# Patient Record
Sex: Male | Born: 1940 | Race: White | Hispanic: No | Marital: Single | Smoking: Never smoker
Health system: Southern US, Community
[De-identification: ages and names within clinical notes are randomized; demographics above are authoritative.]

## PROBLEM LIST (undated history)

## (undated) DIAGNOSIS — I5189 Other ill-defined heart diseases: Secondary | ICD-10-CM

## (undated) DIAGNOSIS — E291 Testicular hypofunction: Secondary | ICD-10-CM

## (undated) DIAGNOSIS — Q791 Other congenital malformations of diaphragm: Secondary | ICD-10-CM

## (undated) DIAGNOSIS — Z5189 Encounter for other specified aftercare: Secondary | ICD-10-CM

## (undated) DIAGNOSIS — F528 Other sexual dysfunction not due to a substance or known physiological condition: Secondary | ICD-10-CM

## (undated) DIAGNOSIS — C801 Malignant (primary) neoplasm, unspecified: Secondary | ICD-10-CM

## (undated) DIAGNOSIS — M1990 Unspecified osteoarthritis, unspecified site: Secondary | ICD-10-CM

## (undated) DIAGNOSIS — B1710 Acute hepatitis C without hepatic coma: Secondary | ICD-10-CM

## (undated) DIAGNOSIS — D126 Benign neoplasm of colon, unspecified: Secondary | ICD-10-CM

## (undated) DIAGNOSIS — K5790 Diverticulosis of intestine, part unspecified, without perforation or abscess without bleeding: Secondary | ICD-10-CM

## (undated) DIAGNOSIS — I10 Essential (primary) hypertension: Secondary | ICD-10-CM

## (undated) DIAGNOSIS — K759 Inflammatory liver disease, unspecified: Secondary | ICD-10-CM

## (undated) DIAGNOSIS — E781 Pure hyperglyceridemia: Secondary | ICD-10-CM

## (undated) DIAGNOSIS — M109 Gout, unspecified: Secondary | ICD-10-CM

## (undated) DIAGNOSIS — R011 Cardiac murmur, unspecified: Secondary | ICD-10-CM

## (undated) DIAGNOSIS — E119 Type 2 diabetes mellitus without complications: Secondary | ICD-10-CM

## (undated) HISTORY — DX: Diverticulosis of intestine, part unspecified, without perforation or abscess without bleeding: K57.90

## (undated) HISTORY — DX: Essential (primary) hypertension: I10

## (undated) HISTORY — DX: Acute hepatitis C without hepatic coma: B17.10

## (undated) HISTORY — DX: Type 2 diabetes mellitus without complications: E11.9

## (undated) HISTORY — DX: Encounter for other specified aftercare: Z51.89

## (undated) HISTORY — DX: Pure hyperglyceridemia: E78.1

## (undated) HISTORY — PX: UPPER GASTROINTESTINAL ENDOSCOPY: SHX188

## (undated) HISTORY — DX: Other ill-defined heart diseases: I51.89

## (undated) HISTORY — PX: TONSILLECTOMY AND ADENOIDECTOMY: SUR1326

## (undated) HISTORY — DX: Unspecified osteoarthritis, unspecified site: M19.90

## (undated) HISTORY — DX: Benign neoplasm of colon, unspecified: D12.6

## (undated) HISTORY — DX: Testicular hypofunction: E29.1

## (undated) HISTORY — DX: Other congenital malformations of diaphragm: Q79.1

## (undated) HISTORY — DX: Other sexual dysfunction not due to a substance or known physiological condition: F52.8

## (undated) HISTORY — DX: Gout, unspecified: M10.9

## (undated) HISTORY — PX: COLONOSCOPY: SHX174

---

## 1997-12-01 HISTORY — PX: CHOLECYSTECTOMY: SHX55

## 1998-12-01 HISTORY — PX: ABDOMINAL HERNIA REPAIR: SHX539

## 1999-05-26 ENCOUNTER — Encounter: Payer: Self-pay | Admitting: Emergency Medicine

## 1999-05-27 ENCOUNTER — Encounter (INDEPENDENT_AMBULATORY_CARE_PROVIDER_SITE_OTHER): Payer: Self-pay | Admitting: Specialist

## 1999-05-27 ENCOUNTER — Inpatient Hospital Stay (HOSPITAL_COMMUNITY): Admission: EM | Admit: 1999-05-27 | Discharge: 1999-05-28 | Payer: Self-pay | Admitting: Emergency Medicine

## 1999-05-27 ENCOUNTER — Encounter: Payer: Self-pay | Admitting: Emergency Medicine

## 1999-05-27 ENCOUNTER — Encounter: Payer: Self-pay | Admitting: Surgery

## 1999-12-04 ENCOUNTER — Encounter (INDEPENDENT_AMBULATORY_CARE_PROVIDER_SITE_OTHER): Payer: Self-pay | Admitting: *Deleted

## 1999-12-04 ENCOUNTER — Ambulatory Visit (HOSPITAL_BASED_OUTPATIENT_CLINIC_OR_DEPARTMENT_OTHER): Admission: RE | Admit: 1999-12-04 | Discharge: 1999-12-04 | Payer: Self-pay | Admitting: Surgery

## 2001-06-29 ENCOUNTER — Encounter: Payer: Self-pay | Admitting: Emergency Medicine

## 2001-06-29 ENCOUNTER — Emergency Department (HOSPITAL_COMMUNITY): Admission: EM | Admit: 2001-06-29 | Discharge: 2001-06-29 | Payer: Self-pay | Admitting: Emergency Medicine

## 2001-07-18 ENCOUNTER — Emergency Department (HOSPITAL_COMMUNITY): Admission: EM | Admit: 2001-07-18 | Discharge: 2001-07-18 | Payer: Self-pay | Admitting: Emergency Medicine

## 2003-12-02 HISTORY — PX: LUMBAR FUSION: SHX111

## 2004-07-03 ENCOUNTER — Ambulatory Visit (HOSPITAL_COMMUNITY): Admission: RE | Admit: 2004-07-03 | Discharge: 2004-07-03 | Payer: Self-pay | Admitting: Orthopedic Surgery

## 2004-08-26 ENCOUNTER — Ambulatory Visit (HOSPITAL_COMMUNITY): Admission: RE | Admit: 2004-08-26 | Discharge: 2004-08-26 | Payer: Self-pay | Admitting: Neurosurgery

## 2004-10-21 ENCOUNTER — Ambulatory Visit (HOSPITAL_COMMUNITY): Admission: RE | Admit: 2004-10-21 | Discharge: 2004-10-21 | Payer: Self-pay | Admitting: Neurology

## 2004-11-18 ENCOUNTER — Encounter: Admission: RE | Admit: 2004-11-18 | Discharge: 2004-11-18 | Payer: Self-pay | Admitting: Neurology

## 2004-11-29 ENCOUNTER — Ambulatory Visit: Payer: Self-pay | Admitting: Endocrinology

## 2004-12-13 ENCOUNTER — Observation Stay (HOSPITAL_COMMUNITY): Admission: RE | Admit: 2004-12-13 | Discharge: 2004-12-14 | Payer: Self-pay | Admitting: Neurosurgery

## 2004-12-20 ENCOUNTER — Ambulatory Visit: Payer: Self-pay | Admitting: Endocrinology

## 2004-12-24 ENCOUNTER — Ambulatory Visit: Payer: Self-pay | Admitting: Endocrinology

## 2005-01-03 ENCOUNTER — Ambulatory Visit: Payer: Self-pay

## 2005-01-10 ENCOUNTER — Ambulatory Visit: Payer: Self-pay | Admitting: Endocrinology

## 2005-03-24 ENCOUNTER — Ambulatory Visit: Payer: Self-pay | Admitting: Endocrinology

## 2005-05-06 ENCOUNTER — Ambulatory Visit: Payer: Self-pay | Admitting: Endocrinology

## 2005-05-12 ENCOUNTER — Ambulatory Visit: Payer: Self-pay | Admitting: Endocrinology

## 2006-02-11 ENCOUNTER — Ambulatory Visit: Payer: Self-pay | Admitting: Endocrinology

## 2006-02-16 ENCOUNTER — Ambulatory Visit: Payer: Self-pay | Admitting: Endocrinology

## 2007-04-04 ENCOUNTER — Emergency Department (HOSPITAL_COMMUNITY): Admission: EM | Admit: 2007-04-04 | Discharge: 2007-04-04 | Payer: Self-pay | Admitting: Emergency Medicine

## 2007-05-05 ENCOUNTER — Ambulatory Visit: Payer: Self-pay | Admitting: Endocrinology

## 2007-05-05 LAB — CONVERTED CEMR LAB
ALT: 41 units/L — ABNORMAL HIGH (ref 0–40)
AST: 33 units/L (ref 0–37)
Albumin: 4.2 g/dL (ref 3.5–5.2)
Alkaline Phosphatase: 53 units/L (ref 39–117)
BUN: 15 mg/dL (ref 6–23)
Bacteria, UA: NEGATIVE
Basophils Absolute: 0 10*3/uL (ref 0.0–0.1)
Basophils Relative: 0.1 % (ref 0.0–1.0)
Bilirubin Urine: NEGATIVE
Bilirubin, Direct: 0.2 mg/dL (ref 0.0–0.3)
CO2: 32 meq/L (ref 19–32)
Calcium: 9.8 mg/dL (ref 8.4–10.5)
Chloride: 103 meq/L (ref 96–112)
Cholesterol: 153 mg/dL (ref 0–200)
Creatinine, Ser: 0.9 mg/dL (ref 0.4–1.5)
Creatinine,U: 81.3 mg/dL
Crystals: NEGATIVE
Eosinophils Absolute: 0.3 10*3/uL (ref 0.0–0.6)
Eosinophils Relative: 3.9 % (ref 0.0–5.0)
GFR calc Af Amer: 108 mL/min
GFR calc non Af Amer: 89 mL/min
Glucose, Bld: 131 mg/dL — ABNORMAL HIGH (ref 70–99)
HCT: 43.4 % (ref 39.0–52.0)
HDL: 44.3 mg/dL (ref >39.0)
Hemoglobin, Urine: NEGATIVE
Hemoglobin: 15.2 g/dL (ref 13.0–17.0)
Hgb A1c MFr Bld: 6.4 % — ABNORMAL HIGH (ref 4.6–6.0)
Ketones, ur: NEGATIVE mg/dL
LDL Cholesterol: 94 mg/dL (ref 0–99)
Leukocytes, UA: NEGATIVE
Lymphocytes Relative: 31.3 % (ref 12.0–46.0)
MCHC: 35 g/dL (ref 30.0–36.0)
MCV: 89 fL (ref 78.0–100.0)
Microalb Creat Ratio: 7.4 mg/g (ref 0.0–30.0)
Microalb, Ur: 0.6 mg/dL (ref 0.0–1.9)
Monocytes Absolute: 0.6 10*3/uL (ref 0.2–0.7)
Monocytes Relative: 7.7 % (ref 3.0–11.0)
Mucus, UA: NEGATIVE
Neutro Abs: 4.2 10*3/uL (ref 1.4–7.7)
Neutrophils Relative %: 57 % (ref 43.0–77.0)
Nitrite: NEGATIVE
PSA: 1.1 ng/mL (ref 0.10–4.00)
Platelets: 233 10*3/uL (ref 150–400)
Potassium: 4.5 meq/L (ref 3.5–5.1)
RBC / HPF: NONE SEEN
RBC: 4.87 M/uL (ref 4.22–5.81)
RDW: 11.8 % (ref 11.5–14.6)
Sodium: 141 meq/L (ref 135–145)
Specific Gravity, Urine: 1.02 (ref 1.000–1.03)
TSH: 1.44 microintl units/mL (ref 0.35–5.50)
Testosterone: 499.72 ng/dL (ref 350.00–890)
Total Bilirubin: 0.9 mg/dL (ref 0.3–1.2)
Total CHOL/HDL Ratio: 3.5
Total Protein, Urine: NEGATIVE mg/dL
Total Protein: 7.4 g/dL (ref 6.0–8.3)
Triglycerides: 76 mg/dL (ref 0–149)
Urine Glucose: NEGATIVE mg/dL
Urobilinogen, UA: 0.2 (ref 0.0–1.0)
VLDL: 15 mg/dL (ref 0–40)
WBC: 7.4 10*3/uL (ref 4.5–10.5)
pH: 6 (ref 5.0–8.0)

## 2007-06-04 ENCOUNTER — Emergency Department (HOSPITAL_COMMUNITY): Admission: EM | Admit: 2007-06-04 | Discharge: 2007-06-04 | Payer: Self-pay | Admitting: *Deleted

## 2007-06-16 ENCOUNTER — Encounter: Admission: RE | Admit: 2007-06-16 | Discharge: 2007-06-16 | Payer: Self-pay | Admitting: Neurosurgery

## 2007-07-27 ENCOUNTER — Ambulatory Visit: Payer: Self-pay | Admitting: Endocrinology

## 2007-08-25 ENCOUNTER — Encounter: Payer: Self-pay | Admitting: *Deleted

## 2007-08-25 DIAGNOSIS — F528 Other sexual dysfunction not due to a substance or known physiological condition: Secondary | ICD-10-CM

## 2007-08-25 DIAGNOSIS — B1710 Acute hepatitis C without hepatic coma: Secondary | ICD-10-CM | POA: Insufficient documentation

## 2007-08-25 DIAGNOSIS — E119 Type 2 diabetes mellitus without complications: Secondary | ICD-10-CM | POA: Insufficient documentation

## 2007-08-25 DIAGNOSIS — E781 Pure hyperglyceridemia: Secondary | ICD-10-CM

## 2007-08-25 DIAGNOSIS — I10 Essential (primary) hypertension: Secondary | ICD-10-CM | POA: Insufficient documentation

## 2007-08-25 HISTORY — DX: Other sexual dysfunction not due to a substance or known physiological condition: F52.8

## 2007-08-25 HISTORY — DX: Pure hyperglyceridemia: E78.1

## 2007-08-25 HISTORY — DX: Essential (primary) hypertension: I10

## 2007-08-25 HISTORY — DX: Type 2 diabetes mellitus without complications: E11.9

## 2007-08-25 HISTORY — DX: Acute hepatitis C without hepatic coma: B17.10

## 2007-09-22 ENCOUNTER — Ambulatory Visit: Payer: Self-pay | Admitting: Endocrinology

## 2007-09-22 LAB — CONVERTED CEMR LAB
ALT: 44 units/L (ref 0–53)
AST: 28 units/L (ref 0–37)
Albumin: 3.7 g/dL (ref 3.5–5.2)
Alkaline Phosphatase: 43 units/L (ref 39–117)
Bilirubin, Direct: 0.2 mg/dL (ref 0.0–0.3)
Cholesterol: 159 mg/dL (ref 0–200)
HDL: 57.2 mg/dL (ref >39.0)
Hgb A1c MFr Bld: 5.6 % (ref 4.6–6.0)
LDL Cholesterol: 75 mg/dL (ref 0–99)
Total Bilirubin: 0.8 mg/dL (ref 0.3–1.2)
Total CHOL/HDL Ratio: 2.8
Total Protein: 6.5 g/dL (ref 6.0–8.3)
Triglycerides: 136 mg/dL (ref 0–149)
VLDL: 27 mg/dL (ref 0–40)

## 2007-09-28 ENCOUNTER — Ambulatory Visit: Payer: Self-pay | Admitting: Endocrinology

## 2008-03-22 ENCOUNTER — Ambulatory Visit: Payer: Self-pay | Admitting: Endocrinology

## 2008-03-26 LAB — CONVERTED CEMR LAB: Hgb A1c MFr Bld: 5.8 % (ref 4.6–6.0)

## 2008-03-27 ENCOUNTER — Ambulatory Visit: Payer: Self-pay | Admitting: Endocrinology

## 2008-07-21 ENCOUNTER — Ambulatory Visit: Payer: Self-pay | Admitting: Endocrinology

## 2008-07-23 LAB — CONVERTED CEMR LAB
ALT: 40 units/L (ref 0–53)
AST: 29 units/L (ref 0–37)
Albumin: 4 g/dL (ref 3.5–5.2)
Alkaline Phosphatase: 45 units/L (ref 39–117)
BUN: 7 mg/dL (ref 6–23)
Basophils Absolute: 0.1 10*3/uL (ref 0.0–0.1)
Basophils Relative: 0.9 % (ref 0.0–3.0)
Bilirubin Urine: NEGATIVE
Bilirubin, Direct: 0.2 mg/dL (ref 0.0–0.3)
CO2: 31 meq/L (ref 19–32)
Calcium: 9.1 mg/dL (ref 8.4–10.5)
Chloride: 102 meq/L (ref 96–112)
Cholesterol: 129 mg/dL (ref 0–200)
Creatinine, Ser: 0.9 mg/dL (ref 0.4–1.5)
Eosinophils Absolute: 0.2 10*3/uL (ref 0.0–0.7)
Eosinophils Relative: 3.1 % (ref 0.0–5.0)
GFR calc Af Amer: 108 mL/min
GFR calc non Af Amer: 89 mL/min
Glucose, Bld: 216 mg/dL — ABNORMAL HIGH (ref 70–99)
HCT: 47.3 % (ref 39.0–52.0)
HDL: 29.7 mg/dL — ABNORMAL LOW (ref >39.0)
Hemoglobin, Urine: NEGATIVE
Hemoglobin: 16.3 g/dL (ref 13.0–17.0)
Hgb A1c MFr Bld: 5.6 % (ref 4.6–6.0)
Ketones, ur: NEGATIVE mg/dL
LDL Cholesterol: 72 mg/dL (ref 0–99)
Leukocytes, UA: NEGATIVE
Lymphocytes Relative: 31.5 % (ref 12.0–46.0)
MCHC: 34.4 g/dL (ref 30.0–36.0)
MCV: 93 fL (ref 78.0–100.0)
Monocytes Absolute: 0.3 10*3/uL (ref 0.1–1.0)
Monocytes Relative: 5.8 % (ref 3.0–12.0)
Neutro Abs: 3.4 10*3/uL (ref 1.4–7.7)
Neutrophils Relative %: 58.7 % (ref 43.0–77.0)
Nitrite: NEGATIVE
PSA: 1.23 ng/mL (ref 0.10–4.00)
Platelets: 220 10*3/uL (ref 150–400)
Potassium: 3.8 meq/L (ref 3.5–5.1)
RBC: 5.09 M/uL (ref 4.22–5.81)
RDW: 12.8 % (ref 11.5–14.6)
Sodium: 141 meq/L (ref 135–145)
Specific Gravity, Urine: 1.02 (ref 1.000–1.03)
TSH: 1.32 microintl units/mL (ref 0.35–5.50)
Total Bilirubin: 1.2 mg/dL (ref 0.3–1.2)
Total CHOL/HDL Ratio: 4.3
Total Protein, Urine: NEGATIVE mg/dL
Total Protein: 6.8 g/dL (ref 6.0–8.3)
Triglycerides: 139 mg/dL (ref 0–149)
Urine Glucose: 250 mg/dL — AB
Urobilinogen, UA: 0.2 (ref 0.0–1.0)
VLDL: 28 mg/dL (ref 0–40)
WBC: 5.9 10*3/uL (ref 4.5–10.5)
pH: 5.5 (ref 5.0–8.0)

## 2008-07-25 ENCOUNTER — Ambulatory Visit: Payer: Self-pay | Admitting: Endocrinology

## 2008-07-26 ENCOUNTER — Encounter: Payer: Self-pay | Admitting: Endocrinology

## 2008-07-27 LAB — CONVERTED CEMR LAB: Testosterone: 522.69 ng/dL (ref 350.00–890)

## 2008-11-27 ENCOUNTER — Ambulatory Visit: Payer: Self-pay | Admitting: Internal Medicine

## 2008-11-27 DIAGNOSIS — M109 Gout, unspecified: Secondary | ICD-10-CM

## 2008-11-27 HISTORY — DX: Gout, unspecified: M10.9

## 2008-12-29 ENCOUNTER — Emergency Department (HOSPITAL_COMMUNITY): Admission: EM | Admit: 2008-12-29 | Discharge: 2008-12-29 | Payer: Self-pay | Admitting: Emergency Medicine

## 2009-05-31 ENCOUNTER — Ambulatory Visit: Payer: Self-pay | Admitting: Internal Medicine

## 2009-07-17 ENCOUNTER — Ambulatory Visit: Payer: Self-pay | Admitting: Endocrinology

## 2009-07-17 DIAGNOSIS — R109 Unspecified abdominal pain: Secondary | ICD-10-CM | POA: Insufficient documentation

## 2009-07-17 LAB — CONVERTED CEMR LAB
Bilirubin Urine: NEGATIVE
Blood in Urine, dipstick: NEGATIVE
Ketones, urine, test strip: NEGATIVE
Nitrite: NEGATIVE
Specific Gravity, Urine: 1.02
Urobilinogen, UA: 0.2
WBC Urine, dipstick: NEGATIVE
pH: 5

## 2009-07-18 LAB — CONVERTED CEMR LAB
Basophils Absolute: 0 10*3/uL (ref 0.0–0.1)
Basophils Relative: 0.4 % (ref 0.0–3.0)
Eosinophils Absolute: 0.1 10*3/uL (ref 0.0–0.7)
Eosinophils Relative: 1.8 % (ref 0.0–5.0)
HCT: 42.1 % (ref 39.0–52.0)
Hemoglobin: 14.4 g/dL (ref 13.0–17.0)
Hgb A1c MFr Bld: 6.1 % (ref 4.6–6.5)
Lymphocytes Relative: 22.3 % (ref 12.0–46.0)
Lymphs Abs: 1.6 10*3/uL (ref 0.7–4.0)
MCHC: 34.2 g/dL (ref 30.0–36.0)
MCV: 91.2 fL (ref 78.0–100.0)
Monocytes Absolute: 0.7 10*3/uL (ref 0.1–1.0)
Monocytes Relative: 9.9 % (ref 3.0–12.0)
Neutro Abs: 4.9 10*3/uL (ref 1.4–7.7)
Neutrophils Relative %: 65.6 % (ref 43.0–77.0)
Platelets: 250 10*3/uL (ref 150.0–400.0)
RBC: 4.62 M/uL (ref 4.22–5.81)
RDW: 12.7 % (ref 11.5–14.6)
Sed Rate: 16 mm/hr (ref 0–22)
Testosterone: 297.66 ng/dL — ABNORMAL LOW (ref 350.00–890.00)
Uric Acid, Serum: 6.5 mg/dL (ref 4.0–7.8)
WBC: 7.3 10*3/uL (ref 4.5–10.5)

## 2009-08-01 ENCOUNTER — Encounter: Admission: RE | Admit: 2009-08-01 | Discharge: 2009-08-01 | Payer: Self-pay | Admitting: Endocrinology

## 2009-09-27 ENCOUNTER — Ambulatory Visit: Payer: Self-pay | Admitting: Endocrinology

## 2009-09-27 DIAGNOSIS — E291 Testicular hypofunction: Secondary | ICD-10-CM | POA: Insufficient documentation

## 2009-09-27 DIAGNOSIS — Q791 Other congenital malformations of diaphragm: Secondary | ICD-10-CM | POA: Insufficient documentation

## 2009-09-27 HISTORY — DX: Other congenital malformations of diaphragm: Q79.1

## 2009-09-27 HISTORY — DX: Testicular hypofunction: E29.1

## 2009-09-27 LAB — CONVERTED CEMR LAB
ALT: 42 units/L (ref 0–53)
AST: 34 units/L (ref 0–37)
Albumin: 4.2 g/dL (ref 3.5–5.2)
Alkaline Phosphatase: 49 units/L (ref 39–117)
BUN: 14 mg/dL (ref 6–23)
Bilirubin Urine: NEGATIVE
Bilirubin, Direct: 0.2 mg/dL (ref 0.0–0.3)
CO2: 27 meq/L (ref 19–32)
Calcium: 9.2 mg/dL (ref 8.4–10.5)
Chloride: 100 meq/L (ref 96–112)
Cholesterol: 165 mg/dL (ref 0–200)
Creatinine, Ser: 0.7 mg/dL (ref 0.4–1.5)
Creatinine,U: 57 mg/dL
Direct LDL: 90.6 mg/dL
FSH: 20.7 milliintl units/mL — ABNORMAL HIGH (ref 1.4–18.1)
GFR calc non Af Amer: 118.67 mL/min (ref >60)
Glucose, Bld: 108 mg/dL — ABNORMAL HIGH (ref 70–99)
HDL: 44.3 mg/dL (ref >39.00)
Hemoglobin, Urine: NEGATIVE
Hgb A1c MFr Bld: 5.5 % (ref 4.6–6.5)
Ketones, ur: NEGATIVE mg/dL
LH: 6.95 milliintl units/mL (ref 1.50–9.30)
Microalb Creat Ratio: 5.3 mg/g (ref 0.0–30.0)
Microalb, Ur: 0.3 mg/dL (ref 0.0–1.9)
Nitrite: NEGATIVE
PSA: 1.48 ng/mL (ref 0.10–4.00)
Potassium: 3.6 meq/L (ref 3.5–5.1)
Prolactin: 7.5 ng/mL
Sodium: 135 meq/L (ref 135–145)
Specific Gravity, Urine: 1.015 (ref 1.000–1.030)
TSH: 1.72 microintl units/mL (ref 0.35–5.50)
Total Bilirubin: 0.8 mg/dL (ref 0.3–1.2)
Total CHOL/HDL Ratio: 4
Total Protein, Urine: NEGATIVE mg/dL
Total Protein: 7.1 g/dL (ref 6.0–8.3)
Triglycerides: 231 mg/dL — ABNORMAL HIGH (ref 0.0–149.0)
Urine Glucose: NEGATIVE mg/dL
Urobilinogen, UA: 0.2 (ref 0.0–1.0)
VLDL: 46.2 mg/dL — ABNORMAL HIGH (ref 0.0–40.0)
pH: 5.5 (ref 5.0–8.0)

## 2009-10-03 ENCOUNTER — Telehealth (INDEPENDENT_AMBULATORY_CARE_PROVIDER_SITE_OTHER): Payer: Self-pay | Admitting: *Deleted

## 2009-10-08 ENCOUNTER — Telehealth: Payer: Self-pay | Admitting: Endocrinology

## 2009-10-08 ENCOUNTER — Ambulatory Visit: Payer: Self-pay | Admitting: Endocrinology

## 2009-10-11 ENCOUNTER — Encounter: Payer: Self-pay | Admitting: Internal Medicine

## 2009-10-16 ENCOUNTER — Telehealth: Payer: Self-pay | Admitting: Endocrinology

## 2009-11-05 ENCOUNTER — Telehealth: Payer: Self-pay | Admitting: Endocrinology

## 2009-11-05 ENCOUNTER — Ambulatory Visit: Payer: Self-pay | Admitting: Endocrinology

## 2009-11-05 LAB — CONVERTED CEMR LAB
Testosterone: 304.78 ng/dL — ABNORMAL LOW (ref 350.00–890.00)
Uric Acid, Serum: 6.5 mg/dL (ref 4.0–7.8)

## 2010-01-24 ENCOUNTER — Encounter: Payer: Self-pay | Admitting: Endocrinology

## 2010-02-04 ENCOUNTER — Telehealth: Payer: Self-pay | Admitting: Endocrinology

## 2010-02-06 ENCOUNTER — Ambulatory Visit: Payer: Self-pay | Admitting: Endocrinology

## 2010-02-06 DIAGNOSIS — L908 Other atrophic disorders of skin: Secondary | ICD-10-CM | POA: Insufficient documentation

## 2010-02-06 DIAGNOSIS — L918 Other hypertrophic disorders of the skin: Secondary | ICD-10-CM

## 2010-02-06 DIAGNOSIS — M79609 Pain in unspecified limb: Secondary | ICD-10-CM | POA: Insufficient documentation

## 2010-02-06 LAB — CONVERTED CEMR LAB
Hgb A1c MFr Bld: 5.6 % (ref 4.6–6.5)
Sed Rate: 2 mm/hr (ref 0–22)
Uric Acid, Serum: 6 mg/dL (ref 4.0–7.8)

## 2010-03-07 ENCOUNTER — Encounter: Payer: Self-pay | Admitting: Endocrinology

## 2010-06-11 ENCOUNTER — Telehealth: Payer: Self-pay | Admitting: Endocrinology

## 2010-06-12 ENCOUNTER — Telehealth: Payer: Self-pay | Admitting: Endocrinology

## 2010-06-12 ENCOUNTER — Telehealth: Payer: Self-pay | Admitting: Internal Medicine

## 2010-09-11 ENCOUNTER — Encounter: Payer: Self-pay | Admitting: Endocrinology

## 2010-10-01 ENCOUNTER — Telehealth: Payer: Self-pay | Admitting: Endocrinology

## 2010-12-31 NOTE — Progress Notes (Signed)
  Phone Note Refill Request Message from:  Patient on October 01, 2010 10:39 AM  Refills Requested: Medication #1:  ENDOCET 10-325 MG TABS 1 q6h as needed pain Initial call taken by: Margaret Pyle, CMA,  October 01, 2010 10:39 AM  Follow-up for Phone Call        i printed refill x1 cpx is due Follow-up by: Minus Breeding MD,  October 01, 2010 10:45 AM  Additional Follow-up for Phone Call Additional follow up Details #1::        No answer no VM, Rx in cabinet for pt pick up  Pt informed of Rx Additional Follow-up by: Margaret Pyle, CMA,  October 01, 2010 11:17 AM    Prescriptions: ENDOCET 10-325 MG TABS (OXYCODONE-ACETAMINOPHEN) 1 q6h as needed pain  #50 x 0   Entered and Authorized by:   Minus Breeding MD   Signed by:   Minus Breeding MD on 10/01/2010   Method used:   Print then Give to Patient   RxID:   [id number]

## 2010-12-31 NOTE — Progress Notes (Signed)
Summary: Percocet  Phone Note Call from Patient Call back at Home Phone [phone]   Caller: Patient Summary of Call: Pt called stating that he has Colchicine for gout. Pt is specifically requesting Percocet for pain. Please advise Initial call taken by: Margaret Pyle, CMA,  June 12, 2010 11:23 AM  Follow-up for Phone Call        i printed Follow-up by: Minus Breeding MD,  June 12, 2010 2:31 PM  Additional Follow-up for Phone Call Additional follow up Details #1::        Pt informed, Rx in cabinet for pt pick up Additional Follow-up by: Margaret Pyle, CMA,  June 12, 2010 2:39 PM    Prescriptions: ENDOCET 10-325 MG TABS (OXYCODONE-ACETAMINOPHEN) 1 q6h as needed pain  #50 x 0   Entered and Authorized by:   Minus Breeding MD   Signed by:   Minus Breeding MD on 06/12/2010   Method used:   Print then Give to Patient   RxID:   [id number]

## 2010-12-31 NOTE — Assessment & Plan Note (Signed)
Summary: 4 MTH FU CPX-$50-STC   Vital Signs:  Patient Profile:   70 Years Old Male Weight:      225.8 pounds Temp:     97.1 degrees F oral Pulse rate:   70 / minute BP sitting:   140 / 86  (left arm) Cuff size:   regular  Pt. in pain?   no  Vitals Entered By: Orlan Leavens (July 25, 2008 9:51 AM)                  Chief Complaint:  CPX.  History of Present Illness: non-smoker.  etoh is 10/week    Current Allergies: ! 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         denies weight loss, decreased urinary stream, blurry vision, headache, chest pain, cramps, excessive diaphoresis, memory loss, depression, bruising     Physical Exam  General:     well developed, well nourished, in no acute distress Head:     head is normocephalic eyes: no scleral icterus no periorbital swelling perrl external ears are normal nose normal externally mouth has no lesion, including normal tongue  Neck:     no masses, thyromegaly, or abnormal cervical nodes Lungs:     clear to auscultation.  no respiratory distress  Heart:     regular rate and rhythm, S1, S2 without murmurs, rubs, gallops, or clicks Abdomen:     abdomen is soft, nontender.  no hepatosplenomegaly.   not distended.  no hernia  Rectal:     normal external and internal exam.  heme neg  Msk:     no deformity or scoliosis noted with normal posture and gait Pulses:     dorsalis pedis intact bilat.  no carotid bruit  Neurologic:     cn 2-12 grossly intact.    readily moves all 4's.   sensation is intact to touch on the feet  Skin:     normal texture and temp.  no rash.  not diaphoretic  Cervical Nodes:     no significant adenopathy Psych:     alert and cooperative; normal mood and affect; normal attention span and concentration Additional Exam:     SEPARATE EVALUATION FOLLOWS--EACH PROBLEM HERE IS NEW, NOT RESPONDING TO TREATMENT, OR POSES SIGNIFICANT RISK TO THE PATIENT'S HEALTH: HISTORY OF THE PRESENT ILLNESS: takes bp meds as rx'ed pt c/o edema recently pt states ed sxs recently PAST MEDICAL HISTORY reviewed and up to date today REVIEW OF SYSTEMS: denies decreased urinary stream and sob PHYSICAL EXAMINATION: prostate normal no deformity.  no ulcer on the feet.  feet are of normal color and temp.  1+ bilat leg edema LAB/XRAY RESULTS: copy given to pt IMPRESSION: needs increased rx of htn ed edema PLAN: add cozaar 25/d d/c actos recheck testosterone i offered ref urol--he says he will consider    Impression & Recommendations:  Problem # 1:  ROUTINE GENERAL MEDICAL  EXAM@HEALTH  CARE FACL (ICD-V70.0)  Medications Added to Medication List This Visit: 1)  Cozaar 25 Mg Tabs (Losartan potassium) .... Qd  Other Orders: EKG w/ Interpretation (93000) TLB-Testosterone, Total (84403-TESTO) Est. Patient Level IV (16109)   Patient Instructions: 1)  we discussed the recommendations of the preventive services task force 2)  d/c etoh 3)  i advised pt of risk of refusal of colonoscopy   Prescriptions: COZAAR 25 MG TABS (LOSARTAN POTASSIUM) qd  #30 x 11   Entered and Authorized by:   Minus Breeding MD   Signed by:   Minus Breeding MD on 07/25/2008   Method used:   Electronically to        [facility] [address] #[id number]* (retail)       [address] [place], [place]  [address]       Ph: [phone]       Fax: [phone]   RxID:   [id number]  ]  Preventive Care Screening     colonoscopy refused 2009

## 2010-12-31 NOTE — Progress Notes (Signed)
  Phone Note Call from Patient Call back at Home Phone [phone] Message from:  Patient  Caller: Patient Summary of Call: MD printed pt Rx for Endocet but did not sign. Dr. Jonny Ruiz has agreed to sign second Rx, original destroyed. Initial call taken by: Margaret Pyle, CMA,  June 12, 2010 2:54 PM  Follow-up for Phone Call        Rx sign and pick up by patient Follow-up by: Margaret Pyle, CMA,  June 12, 2010 3:03 PM    Prescriptions: ENDOCET 10-325 MG TABS (OXYCODONE-ACETAMINOPHEN) 1 q6h as needed pain  #50 x 0   Entered by:   Margaret Pyle, CMA   Authorized by:   Corwin Levins MD   Signed by:   Margaret Pyle, CMA on 06/12/2010   Method used:   Print then Give to Patient   RxID:   [id number]

## 2010-12-31 NOTE — Medication Information (Signed)
Summary: denial Cozaar/[facility]  denial Cozaar/[facility]   Imported By: Lester Bennington 07/31/2008 11:56:40  _____________________________________________________________________  External Attachment:    Type:   Image     Comment:   External Document

## 2010-12-31 NOTE — Assessment & Plan Note (Signed)
Summary: FOOT SWOLLEN/$50/PN   Vital Signs:  Patient profile:   70 year old male Height:      71 inches (180.34 cm) Weight:      216.0 pounds (98.18 kg) BMI:     30.23 O2 Sat:      97 % Temp:     97.7 degrees F (36.50 degrees C) oral Pulse rate:   70 / minute BP sitting:   128 / 78  (left arm) Cuff size:   large  Vitals Entered By: Orlan Leavens (May 31, 2009 10:15 AM) CC: (R) Foot swollen, ? gout flare up Is Patient Diabetic? Yes  Pain Assessment Patient in pain? yes     Location: (R) foot Type: aching   Primary Care Provider:  Minus Breeding MD  CC:  (R) Foot swollen and ? gout flare up.  History of Present Illness: hx similar flare 12/09 - diagnosed and treated as gout at that time he denies any known injury to this foot Onset right foot pain and swelling 4 days ago maximum point of pain in base of little toe = 10/10 currently no other joints affected on L side, knees, hands, etc ?what he can do to prevent this pain/flare  Current Medications (verified): 1)  Aspirin 325 Mg  Tabs (Aspirin) .... Take 1 By Mouth Once Daily 2)  Zestoretic 20-12.5 Mg Tabs (Lisinopril-Hydrochlorothiazide) .... Take 2 Tablet By Mouth Once A Day 3)  Norvasc 10 Mg  Tabs (Amlodipine Besylate) .... Qd 4)  Glucophage 1000 Mg Tabs (Metformin Hcl) .... Take 1 Two Times A Day 5)  Centrum Silver Ultra Mens  Tabs (Multiple Vitamins-Minerals) .... Take 1 By Mouth Qd  Allergies (verified): 1)  ! Actos (Pioglitazone Hcl)  Past History:  Past Medical History: Reviewed history from 07/25/2008 and no changes required. 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    Physical Exam  General:  alert, well-developed, well-nourished, and cooperative  to examination.    Lungs:  normal respiratory effort, no intercostal retractions or use of accessory muscles; normal breath sounds bilaterally - no crackles and no wheezes.    Heart:  normal rate, regular rhythm, no murmur, and no rub. BLE without edema. (see Mskel for foot) Msk:  R foot diffuse mild edema with marked erythema over 5th toe: "sausage digit" appearance. tender to touch and passive movement. no ankle effusions with FROM at ankle. 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 Pain control If symptoms unimproved after completion of dose pack, patient to return for further x-ray and serology testing for arthropathies Consider prophylaxis of gout flares. If become more recurrent or frequent We'll also give patient education information on gout to see if this helps prevent recurrent flares  Elevate extremity; warm compresses, symptomatic relief and medication as directed.   Problem # 2:  DIABETES MELLITUS, TYPE II (ICD-250.00) expect steroids may transiently exacerbate glycemic control Patient educated to watch for this and contact us if severe hyperglycemia while on treatment for above His updated medication  list for this problem includes:    Aspirin 325 Mg Tabs (Aspirin) .Marland Kitchen... Take 1 by mouth once daily    Zestoretic 20-12.5 Mg Tabs (Lisinopril-hydrochlorothiazide) .Marland Kitchen... Take 2 tablet by mouth once a day    Glucophage 1000 Mg Tabs (Metformin hcl) .Marland Kitchen... Take 1 two times a day  Complete Medication List: 1)  Aspirin 325 Mg Tabs (Aspirin) .... Take 1 by mouth once daily 2)  Zestoretic 20-12.5 Mg Tabs (Lisinopril-hydrochlorothiazide) .... Take  2 tablet by mouth once a day 3)  Norvasc 10 Mg Tabs (Amlodipine besylate) .... Qd 4)  Glucophage 1000 Mg Tabs (Metformin hcl) .... Take 1 two times a day 5)  Centrum Silver Ultra Mens Tabs (Multiple vitamins-minerals) .... Take 1 by mouth qd 6)  Prednisone (pak) 10 Mg Tabs (Prednisone) .... As directed 7)  Vicodin Hp 10-660 Mg Tabs (Hydrocodone-acetaminophen) .Marland Kitchen.. 1 by mouth every 6 h as needed severe pain  Patient Instructions: 1)  we will treat her for pain as we did in December with steroid shot, a steroid dose pack, and pain medicines 2)  If recurrent flares or pain symptoms persist after completion of your steroids, call our office for further evaluation and testing 3)  You've been given handout information on gout, including causes and treatment Prescriptions: VICODIN HP 10-660 MG TABS (HYDROCODONE-ACETAMINOPHEN) 1 by mouth every 6 h as needed severe pain  #30 x 0   Entered and Authorized by:   Newt Lukes MD   Signed by:   Newt Lukes MD on 05/31/2009   Method used:   Print then Give to Patient   RxID:   [id number] PREDNISONE (PAK) 10 MG TABS (PREDNISONE) as directed  #1 x 0   Entered and Authorized by:   Newt Lukes MD   Signed by:   Newt Lukes MD on 05/31/2009   Method used:   Print then Give to Patient   RxID:   [id number]   Appended Document: FOOT SWOLLEN/$50/PN    Clinical Lists Changes  Orders: Added new Service order of Depo- Medrol 80mg  (J1040) - Signed Added new Service order of Depo- Medrol 40mg  (J1030) - Signed Added new Service order of Admin of Therapeutic Inj  intramuscular or subcutaneous (23557) - Signed       Medication Administration  Injection # 1:    Medication: Depo- Medrol 80mg     Diagnosis: 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

## 2010-12-31 NOTE — Assessment & Plan Note (Signed)
Summary: PER DAH SCHED  STC   Vital Signs:  Patient profile:   70 year old male Height:      71 inches (180.34 cm) Weight:      211.38 pounds (96.08 kg) O2 Sat:      95 % on Room air Temp:     97.3 degrees F (36.28 degrees C) oral Pulse rate:   76 / minute BP sitting:   124 / 70  (left arm) Cuff size:   large  Vitals Entered By: Josph Macho CMA (November 05, 2009 1:03 PM)  O2 Flow:  Room air CC: Right foot swollen X3 days/ pt doesn't want flu vax/ CF Is Patient Diabetic? Yes   Primary Provider:  Minus Breeding MD  CC:  Right foot swollen X3 days/ pt doesn't want flu vax/ CF.  History of Present Illness: pt states few days of severe pain at the instep of the right foot.  no local injury. he feels no different on the androgel   Current Medications (verified): 1)  Aspirin 325 Mg  Tabs (Aspirin) .... Take 1 By Mouth Once Daily 2)  Norvasc 10 Mg  Tabs (Amlodipine Besylate) .... Qd 3)  Glucophage 1000 Mg Tabs (Metformin Hcl) .... Take 1 Two Times A Day 4)  Centrum Silver Ultra Mens  Tabs (Multiple Vitamins-Minerals) .... Take 1 By Mouth Qd 5)  Lisinopril 40 Mg Tabs (Lisinopril) .Marland Kitchen.. 1 Qd 6)  Endocet 10-325 Mg Tabs (Oxycodone-Acetaminophen) .Marland Kitchen.. 1 Q6h As Needed Pain 7)  Androgel 25 Mg/2.5gm Gel (Testosterone) .... 2.5 Grams Qd  Allergies (verified): 1)  ! 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         denies numbness  Physical Exam  General:  normal appearance.   Extremities:  instep of right foot: has moderate tenderness and swelling.  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 1 qhr prn gout, nte 6/day  Other Orders: TLB-Uric Acid, Blood (84550-URIC) TLB-Testosterone, Total (84403-TESTO) Est. Patient Level IV (11914)  Patient Instructions: 1)  continue lisinopril 40 mg once daily, and amlodipine 10 mg once daily, for blood pressure. 2)  i refilled endocet 10/325 1 every 6 hrs as needed pain. 3)  Please schedule a follow-up appointment in 1-2 months. 4)  tests are being ordered for you today.  a few days after the test(s), please call [phone] to hear your test results. 5)  return as needed Prescriptions: ENDOCET 10-325 MG TABS (OXYCODONE-ACETAMINOPHEN) 1 q6h as needed pain  #100 x 0   Entered and Authorized by:   Minus Breeding MD   Signed by:   Minus Breeding MD on 11/05/2009   Method used:   Print then Give to Patient   RxID:   [id number] COLCHICINE 0.6 MG TABS (COLCHICINE) 1 qhr prn gout, nte 6/day  #30 x 2   Entered and Authorized by:   Minus Breeding MD   Signed by:   Minus Breeding MD on 11/05/2009   Method used:   Electronically to  [facility] [address] #[id number]* (retail)       [address] [place], [place]  [address]       Ph: [phone]       Fax: [phone]   RxID:   [id number]

## 2010-12-31 NOTE — Letter (Signed)
Summary: [facility]  [facility]   Imported By: Sherian Rein 01/29/2010 10:55:06  _____________________________________________________________________  External Attachment:    Type:   Image     Comment:   External Document

## 2010-12-31 NOTE — Assessment & Plan Note (Signed)
Summary: f/u appt per pt/#/cd   Vital Signs:  Patient profile:   70 year old male Height:      71 inches (180.34 cm) Weight:      215.75 pounds (98.07 kg) O2 Sat:      95 % on Room air Temp:     97.7 degrees F (36.50 degrees C) oral Pulse rate:   64 / minute BP sitting:   124 / 82  (left arm) Cuff size:   large  Vitals Entered By: Josph Macho RMA (February 06, 2010 9:45 AM)  O2 Flow:  Room air CC: Follow-up visit/ CF Is Patient Diabetic? Yes   Primary Provider:  Minus Breeding MD  CC:  Follow-up visit/ CF.  History of Present Illness: he continues to have intermittent pain at the instep of the left foot.  no local injury. pt states 4 mos of slight irritation at the skin of the left neck, and no associated itching. he takes allopurinol as rx'ed.  he thinks the for pain may be due to gout.  Current Medications (verified): 1)  Aspirin 325 Mg  Tabs (Aspirin) .... Take 1 By Mouth Once Daily 2)  Norvasc 10 Mg  Tabs (Amlodipine Besylate) .... Qd 3)  Glucophage 1000 Mg Tabs (Metformin Hcl) .... Take 1 Two Times A Day 4)  Centrum Silver Ultra Mens  Tabs (Multiple Vitamins-Minerals) .... Take 1 By Mouth Qd 5)  Lisinopril 40 Mg Tabs (Lisinopril) .Marland Kitchen.. 1 Qd 6)  Endocet 10-325 Mg Tabs (Oxycodone-Acetaminophen) .Marland Kitchen.. 1 Q6h As Needed Pain 7)  Androgel 25 Mg/2.5gm Gel (Testosterone) .... 2.5 Grams Qd 8)  Colchicine 0.6 Mg Tabs (Colchicine) .Marland Kitchen.. 1 Qhr Prn Gout, Nte 6/day 9)  Allopurinol 100 Mg Tabs (Allopurinol) .Marland Kitchen.. 1 Qam  Allergies (verified): 1)  ! 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         denies rash  Physical Exam  General:  normal appearance.   Msk:  gait: favors left foot. Pulses:   left dp is ormaln Extremities:  left toot.  trace swelling.  there is slight tenderness at the instep. no warmth/ulcer/erythema Neurologic:  sensation is intact to touch on the left foot. 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 related to #1  Medications Added to Medication List This Visit: 1)  Androgel 25 Mg/2.5gm Gel (Testosterone) .... 2.5 grams once daily dr Aldean Ast 2)  Allopurinol 300 Mg Tabs (Allopurinol) .Marland Kitchen.. 1 qd  Other Orders: Dermatology Referral (Derma) TLB-Uric Acid, Blood (84550-URIC) TLB-Sedimentation Rate (ESR) (85652-ESR) TLB-A1C / Hgb A1C (Glycohemoglobin) (83036-A1C) T-Foot Left Min 3 Views (73630TC) Est. Patient Level IV (16109) Prescription Created Electronically [phone])  Patient Instructions: 1)  blood tests and x ray today. 2)  tests are being ordered for you today.  a few days after the test(s), please call [phone] to hear your test results. 3)  pending the test results, please continue  the same medications for now. 4)  refer dermatology.  you will be called with a day and time for an appointment 5)  (update: i left message on phone-tree:  increase allopurinol to 300 mg once daily). Prescriptions: ALLOPURINOL 300 MG TABS (ALLOPURINOL) 1 qd  #30 x 11   Entered and Authorized by:   Minus Breeding MD   Signed by:   Minus Breeding MD on 02/06/2010   Method used:   Electronically to        [facility] [address] #[id number]* (retail)       [address] [place], [place]  [address]       Ph: [phone]       Fax: [phone]   RxID:   [id number]

## 2010-12-31 NOTE — Assessment & Plan Note (Signed)
Summary: CPX/$50/CD-PER PT RS BUMP/PHONE TO NOV-STC   Vital Signs:  Patient profile:   70 year old male Height:      71 inches (180.34 cm) Weight:      209.25 pounds (95.11 kg) O2 Sat:      94 % on Room air Temp:     97.0 degrees F (36.11 degrees C) oral Pulse rate:   73 / minute BP sitting:   118 / 74  (left arm) Cuff size:   large  Vitals Entered By: Josph Macho CMA (September 27, 2009 11:06 AM)  O2 Flow:  Room air CC: physical/ CF Is Patient Diabetic? Yes   Primary Provider:  Minus Breeding MD  CC:  physical/ CF.  History of Present Illness: here for regular wellness examination.  He's feeling pretty well in general, and does not smoke.  alcohol is rare   Current Medications (verified): 1)  Aspirin 325 Mg  Tabs (Aspirin) .... Take 1 By Mouth Once Daily 2)  Zestoretic 20-12.5 Mg Tabs (Lisinopril-Hydrochlorothiazide) .... Take 2 Tablet By Mouth Once A Day 3)  Norvasc 10 Mg  Tabs (Amlodipine Besylate) .... Qd 4)  Glucophage 1000 Mg Tabs (Metformin Hcl) .... Take 1 Two Times A Day 5)  Centrum Silver Ultra Mens  Tabs (Multiple Vitamins-Minerals) .... Take 1 By Mouth Qd 6)  Vicodin Hp 10-660 Mg Tabs (Hydrocodone-Acetaminophen) .Marland Kitchen.. 1 By Mouth Every 6 H As Needed Severe Pain  Allergies (verified): 1)  ! Actos (Pioglitazone Hcl)  Family History: Reviewed history from 07/25/2008 and no changes required. no cancer  Social History: Reviewed history from 11/27/2008 and no changes required. 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    Physical Exam  General:  normal appearance.   Head:  head: no deformity eyes: no periorbital swelling, no proptosis external nose and ears are  normal mouth: no lesion seen Neck:  Supple without thyroid enlargement or tenderness. No cervical lymphadenopathy, neck masses or tracheal deviation.  Chest Wall:  has prominent xyphoid Lungs:  clear to auscultation, except for decreased breath sounds at the left base.  no respiratory distress.  Heart:  Regular rate and rhythm without murmurs or gallops noted. Normal S1,S2.   Abdomen:  abdomen is soft, nontender.  no hepatosplenomegaly.   not distended.  there is a self-reducing ventral hernia  Rectal:  normal external and internal exam.  heme neg  Prostate:  Normal size prostate without masses or tenderness.  Msk:  muscle bulk and strength are grossly normal.  no obvious joint swelling.  gait is normal and steady  Pulses:  dorsalis pedis intact bilat.  no carotid bruit  Neurologic:  cn 2-12 grossly intact.   readily moves all 4's.   sensation is intact to touch on the feet  Skin:  normal texture and temp.  no rash.  not diaphoretic  Cervical Nodes:  No significant adenopathy.  Psych:  Alert and cooperative; normal mood and affect; normal attention span and concentration.   Additional Exam:  SEPARATE EVALUATION FOLLOWS--EACH PROBLEM HERE IS NEW, NOT RESPONDING TO TREATMENT, OR POSES SIGNIFICANT RISK TO THE PATIENT'S HEALTH: HISTORY OF THE PRESENT ILLNESS: right low-back pain persists pt says he continues to have "gout pain," left foot > right ed sxs persist pt was noted to have low  testosterone few mos ago. PAST MEDICAL HISTORY reviewed and up to date today REVIEW OF SYSTEMS: denies decreased urinary stream and numbness PHYSICAL EXAMINATION: see vs page no deformity.  no ulcer on the feet.  feet are of normal color and temp.  there is trace bilat edema there are bilateral varicosities on the legs LAB/XRAY RESULTS: Leutinizing Hormone       6.95 mIU/mL                 1.50-9.30 Prolactin                 7.5 ng/mL FSH                  [H]  20.7 mI IMPRESSION: primary  hypogonadism low-back pain gout erectile dysfunction, ? related to hypogonadism PLAN: i offered androgen 2.5 g once daily best option is pain medication see instructions   Impression & Recommendations:  Problem # 1:  ROUTINE GENERAL MEDICAL EXAM@HEALTH  CARE FACL (ICD-V70.0)  Medications Added to Medication List This Visit: 1)  Lisinopril 40 Mg Tabs (Lisinopril) .Marland Kitchen.. 1 qd 2)  Endocet 10-325 Mg Tabs (Oxycodone-acetaminophen) .Marland Kitchen.. 1 q6h as needed pain  Other Orders: EKG w/ Interpretation (93000) TLB-Lipid Panel (80061-LIPID) TLB-BMP (Basic Metabolic Panel-BMET) (80048-METABOL) TLB-Hepatic/Liver Function Pnl (80076-HEPATIC) TLB-TSH (Thyroid Stimulating Hormone) (84443-TSH) TLB-A1C / Hgb A1C (Glycohemoglobin) (83036-A1C) TLB-Microalbumin/Creat Ratio, Urine (82043-MALB) TLB-PSA (Prostate Specific Antigen) (84153-PSA) TLB-Udip w/ Micro (81001-URINE) TLB-Luteinizing Hormone (LH) (83002-LH) TLB-Prolactin (84146-PROL) TLB-FSH (Follicle Stimulating Hormone) (83001-FSH) Est. Patient Level IV (04540) Est. Patient 65& > (98119)  Patient Instructions: 1)  change lisinopril-hctz to lisinopril 40 mg once daily 2)  percocet 10/325 1 every 6 hrs as needed pain 3)  Please schedule a follow-up appointment in 1-2 months. Prescriptions: ENDOCET 10-325 MG TABS (OXYCODONE-ACETAMINOPHEN) 1 q6h as needed pain  #50 x 0   Entered and Authorized by:   Minus Breeding MD   Signed by:   Minus Breeding MD on 09/27/2009   Method used:   Print then Give to Patient   RxID:   [id number] LISINOPRIL 40 MG TABS (LISINOPRIL) 1 qd  #30 x 11   Entered and Authorized by:   Minus Breeding MD   Signed by:   Minus Breeding MD on 09/27/2009   Method used:   Electronically to        [facility] [address] #[id number]* (retail)       [address] [place], [place]  [address]       Ph: [phone]       Fax: [phone]   RxID:   [id number]    Preventive Care Screening  Last Tetanus Booster:     Date:  08/31/2000    Results:  Td      pt says he had colonoscopy done approx 2005

## 2010-12-31 NOTE — Progress Notes (Signed)
Summary: PA Androgel  Phone Note From Pharmacy   Caller: [facility] [address] #[id number]* Summary of Call: PA required for Androgel. Forms received and put on MD's desk for review Initial call taken by: Margaret Pyle, CMA,  October 08, 2009 8:22 AM     Appended Document: PA Androgel PA paperwork faxed to PA dept. awaiting response  Appended Document: PA Androgel PA was denied

## 2010-12-31 NOTE — Letter (Signed)
[facility]   Imported By: Sherian Rein 03/18/2010 11:27:23  _____________________________________________________________________  External Attachment:    Type:   Image     Comment:   External Document

## 2010-12-31 NOTE — Medication Information (Signed)
Summary: [facility]  [facility]   Imported By: Sherian Rein 10/15/2009 13:45:20  _____________________________________________________________________  External Attachment:    Type:   Image     Comment:   External Document

## 2010-12-31 NOTE — Progress Notes (Signed)
  Phone Note Call from Patient Call back at Home Phone [phone]   Caller: Patient Call For: Minus Breeding MD Summary of Call: Pt called  and states he listened to phone tree and was advised to call if he chose testosterone, he would like it. Please advise, Initial call taken by: Verdell Face,  October 03, 2009 11:25 AM  Follow-up for Phone Call        please advise Follow-up by: Josph Macho CMA,  October 03, 2009 11:28 AM  Additional Follow-up for Phone Call Additional follow up Details #1::        i printed Additional Follow-up by: Minus Breeding MD,  October 03, 2009 12:11 PM    Additional Follow-up for Phone Call Additional follow up Details #2::    printed rx put on Christy's desk for MD's signiture Follow-up by: Margaret Pyle, CMA,  October 03, 2009 1:23 PM  Additional Follow-up for Phone Call Additional follow up Details #3:: Details for Additional Follow-up Action Taken: Informed pt and let him know it would be in the front for him to pick up Additional Follow-up by: Josph Macho CMA,  October 04, 2009 8:45 AM  New/Updated Medications: ANDROGEL 25 MG/2.5GM GEL (TESTOSTERONE) 2.5 grams qd Prescriptions: ANDROGEL 25 MG/2.5GM GEL (TESTOSTERONE) 2.5 grams qd  #30 doses x 1   Entered and Authorized by:   Minus Breeding MD   Signed by:   Minus Breeding MD on 10/03/2009   Method used:   Print then Give to Patient   RxID:   [id number]

## 2010-12-31 NOTE — Assessment & Plan Note (Signed)
Summary: SIDE PAIN/ FOR WEEK / $50 /NWS   Vital Signs:  Patient profile:   70 year old male Height:      71 inches Weight:      206 pounds BMI:     28.84 O2 Sat:      95 % on Room air Temp:     97.2 degrees F oral Pulse rate:   77 / minute BP sitting:   126 / 78  (left arm) Cuff size:   large  Vitals Entered By: Bill Salinas CMA (July 17, 2009 3:02 PM)  O2 Flow:  Room air CC: pt here with complaint of lower back pain radiating to his right side with nausea and night sweats x 1 week/ on pain scale pt rates pain 8/10/ pt is no longer taking the vicodin hp/ ab   Primary Provider:  Minus Breeding MD  CC:  pt here with complaint of lower back pain radiating to his right side with nausea and night sweats x 1 week/ on pain scale pt rates pain 8/10/ pt is no longer taking the vicodin hp/ ab.  History of Present Illness: 1 week of severe pain at the right flank, and associated nausea.   does not check cbg's. takes 2 bp meds as rx'ed. ed sxs persist. no help with viagra nor cialis  Current Medications (verified): 1)  Aspirin 325 Mg  Tabs (Aspirin) .... Take 1 By Mouth Once Daily 2)  Zestoretic 20-12.5 Mg Tabs (Lisinopril-Hydrochlorothiazide) .... Take 2 Tablet By Mouth Once A Day 3)  Norvasc 10 Mg  Tabs (Amlodipine Besylate) .... Qd 4)  Glucophage 1000 Mg Tabs (Metformin Hcl) .... Take 1 Two Times A Day 5)  Centrum Silver Ultra Mens  Tabs (Multiple Vitamins-Minerals) .... Take 1 By Mouth Qd 6)  Vicodin Hp 10-660 Mg Tabs (Hydrocodone-Acetaminophen) .Marland Kitchen.. 1 By Mouth Every 6 H As Needed Severe Pain  Allergies (verified): 1)  ! 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      denies vomiting and diarrhea  Physical Exam  General:  normal appearance.   Abdomen:  abdomen is soft, nontender.  no hepatosplenomegaly.   not distended.  no hernia  Additional Exam:  Hemoglobin A1C            6.1 %    White Cell Count          7.3 K/uL                    4.5-10.5  Hemoglobin                14.4 g/dL                   16.1-09.6   Hematocrit                42.1 %                      39.0-52.0   Platelet Count            250.0 K/uL   Testosterone         297.66 ng/dL    Impression & Recommendations:  Problem # 1:  FLANK PAIN, RIGHT (ICD-789.09) Assessment New  Problem # 2:  DIABETES MELLITUS,  TYPE II (ICD-250.00) well-controlled  Problem # 3:  HYPERTENSION (ICD-401.9) well-controlled  Problem # 4:  ERECTILE DYSFUNCTION (ICD-302.72) with mildly low testosterone  Other Orders: TLB-CBC Platelet - w/Differential (85025-CBCD) TLB-A1C / Hgb A1C (Glycohemoglobin) (83036-A1C) TLB-Uric Acid, Blood (84550-URIC) TLB-Sedimentation Rate (ESR) (85652-ESR) TLB-Testosterone, Total (84403-TESTO) Radiology Referral (Radiology) Est. Patient Level IV (27253)  Patient Instructions: 1)  renal ultrasound 2)  same medications 3)  physical in 3 months 4)  tests are being ordered for you today.  a few days after the test(s), please call [phone] to hear your test results.  this is very important to do because the results may change the instructions you see here. 5)  i offered referral to urol. 6)  (update: i left message on phone-tree:  rx as we discussed) 7)  normalizing this mildly low testosterone has been shown to not help sxs  Laboratory Results   Urine Tests   Date/Time Reported: 07/17/2009  Routine Urinalysis   Color: orange Bilirubin: negative   (Normal Range: Negative) Ketone: negative   (Normal Range: Negative) Spec. Gravity: 1.020   (Normal Range: 1.003-1.035) Blood: negative   (Normal Range: Negative) pH: 5.0   (Normal Range: 5.0-8.0) Protein: trace    (Normal Range: Negative) Urobilinogen: 0.2   (Normal Range: 0-1) Nitrite: negative   (Normal Range: Negative) Leukocyte Esterace: negative   (Normal Range: Negative)

## 2010-12-31 NOTE — Assessment & Plan Note (Signed)
Summary: SWOLLEN R FOOT/$50/CD   Vital Signs:  Patient Profile:   70 Years Old Male Weight:      219 pounds O2 Sat:      95 % O2 treatment:    Room Air Temp:     96.4 degrees F oral Pulse rate:   76 / minute BP sitting:   140 / 90  (left arm) Cuff size:   regular  Pt. in pain?   yes    Location:   foot    Intensity:   10  Vitals Entered By: Payton Spark CMA (November 27, 2008 3:09 PM)                  Chief Complaint:  SWOLLEN r FOT X 3 DAYS.  History of Present Illness: doing well except for 3 days onset severe red, swelling and tender to waking up 3 days ago; no trauma or fever, no hx of gout per pt; denies fever as well; did have a similar episode 17 yrs but does not thinke he was told it was gout but was incarcerated at the time and the symptom was not treated and lasted for a year; also mentions he stopped his asa recently, is on chronic diuretic; recent labs reviewed today, no recent uric acid level done    Updated Prior Medication List: ASPIRIN 325 MG  TABS (ASPIRIN) take 1 by mouth once daily ZESTORETIC 20-12.5 MG TABS (LISINOPRIL-HYDROCHLOROTHIAZIDE) Take 2 tablet by mouth once a day NORVASC 10 MG  TABS (AMLODIPINE BESYLATE) qd GLUCOPHAGE 1000 MG TABS (METFORMIN HCL) TAKE 1 two times a day VICODIN 5-500 MG TABS (HYDROCODONE-ACETAMINOPHEN) 1po q 6 hrs as needed pain PREDNISONE 10 MG TABS (PREDNISONE) 6po once daily for 3day, then 4po once daily for 3day, then 3po once daily for 3day, then 2po once daily for 3day, then 1po once daily for 3day, then stop  Current Allergies (reviewed today): ! 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   Head:     Normocephalic and atraumatic without obvious abnormalities. No apparent alopecia or balding. Eyes:     No corneal or conjunctival inflammation noted. EOMI. Perrla.  Ears:     External ear exam shows no significant lesions or deformities.  Otoscopic examination reveals clear canals, tympanic membranes are intact bilaterally without bulging, retraction, inflammation or discharge. Hearing is grossly normal bilaterally. Nose:     External nasal examination shows no deformity or inflammation. Nasal mucosa are pink and moist without lesions or exudates. Mouth:     Oral mucosa and oropharynx without lesions or exudates.  Teeth in good repair. Neck:     No  deformities, masses, or tenderness noted. Lungs:     Normal respiratory effort, chest expands symmetrically. Lungs are clear to auscultation, no crackles or wheezes. Heart:     Normal rate and regular rhythm. S1 and S2 normal without gallop, murmur, click, rub or other extra sounds. 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  Qd  His updated medication list for this problem includes:    Zestoretic 20-12.5 Mg Tabs (Lisinopril-hydrochlorothiazide) .Marland Kitchen... Take 2 tablet by mouth once a day    Norvasc 10 Mg Tabs (Amlodipine besylate) ..... Qd  BP today: 140/90 Prior BP: 140/86 (07/25/2008)  Labs Reviewed: Creat: 0.9 (07/21/2008) Chol: 129 (07/21/2008)   HDL: 29.7 (07/21/2008)   LDL: 72 (07/21/2008)   TG: 139 (07/21/2008) stable overall by hx and exam, ok to continue meds/tx as is - to cont same meds for now  Problem # 3:  DIABETES MELLITUS, TYPE II (ICD-250.00)  The following medications were removed from the medication list:    Glucophage 500 Mg Tabs (Metformin hcl) .Marland Kitchen... Take 1 by mouth two times a day qd    Cozaar 25 Mg Tabs (Losartan potassium) ..... Qd  His updated medication list for this problem includes:    Aspirin 325 Mg Tabs (Aspirin) .Marland Kitchen... Take 1 by mouth once daily    Zestoretic 20-12.5 Mg Tabs (Lisinopril-hydrochlorothiazide) .Marland Kitchen... Take 2 tablet by mouth once a day    Glucophage 1000 Mg Tabs (Metformin hcl) .Marland Kitchen... Take 1 two times a day stable overall by hx and exam, ok to continue meds/tx as is - to follow cbg';s at home, call for onset polydipsia or polyuria  Complete Medication List: 1)  Aspirin 325 Mg Tabs (Aspirin) .... Take 1 by mouth once daily 2)  Zestoretic 20-12.5 Mg Tabs (Lisinopril-hydrochlorothiazide) .... Take 2 tablet by mouth once a day 3)  Norvasc 10 Mg Tabs (Amlodipine besylate) .... Qd 4)  Glucophage 1000 Mg Tabs (Metformin hcl) .... Take 1 two times a day 5)  Vicodin 5-500 Mg Tabs (Hydrocodone-acetaminophen) .Marland Kitchen.. 1po q 6 hrs as needed pain 6)  Prednisone 10 Mg Tabs (Prednisone) .... 6po once daily for 3day, then 4po once daily for 3day, then 3po once daily for 3day, then 2po once daily for 3day, then 1po once daily for 3day, then stop   Patient Instructions: 1)  Take an Aspirin every day - 81 mg - 1per day - COATED only  2)  Please take all new medications as prescribed 3)   you received the steroid shot today 4)  Continue all medications that you may have been taking previously 5)  Please schedule an appointment with your primary doctor as needed   Prescriptions: PREDNISONE 10 MG TABS (PREDNISONE) 6po once daily for 3day, then 4po once daily for 3day, then 3po once daily for 3day, then 2po once daily for 3day, then 1po  once daily for 3day, then stop  #48 x 0   Entered and Authorized by:   Corwin Levins MD   Signed by:   Corwin Levins MD on 11/27/2008   Method used:   Print then Give to Patient   RxID:   [id number] VICODIN 5-500 MG TABS (HYDROCODONE-ACETAMINOPHEN) 1po q 6 hrs as needed pain  #30 x 1   Entered and Authorized by:   Corwin Levins MD   Signed by:   Corwin Levins MD on 11/27/2008   Method used:   Print then Give to Patient   RxID:   [id number]  ]  Appended Document: SWOLLEN R FOOT/$50/CD    Nurse Visit   Vitals Entered By: Payton Spark CMA (November 27, 2008 3:50 PM)                 Prior Medications: ZESTORETIC 20-12.5 MG TABS (LISINOPRIL-HYDROCHLOROTHIAZIDE) Take 2 tablet by mouth once a day NORVASC 10 MG  TABS (AMLODIPINE BESYLATE) qd GLUCOPHAGE 1000 MG TABS (METFORMIN HCL) TAKE 1 two times a day Current Allergies: ! 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

## 2010-12-31 NOTE — Progress Notes (Signed)
Summary: Rx refill req  Phone Note Call from Patient Call back at Home Phone [phone]   Caller: Patient Summary of Call: Pt called requesting refill of pain meds for Gout flare. Initial call taken by: Margaret Pyle, CMA,  June 11, 2010 2:23 PM  Follow-up for Phone Call        sent Follow-up by: Minus Breeding MD,  June 11, 2010 2:29 PM  Additional Follow-up for Phone Call Additional follow up Details #1::        Pt advised via VM. Told to check with pharmacy about medication Additional Follow-up by: Margaret Pyle, CMA,  June 11, 2010 4:33 PM    Prescriptions: COLCHICINE 0.6 MG TABS (COLCHICINE) 1 qhr prn gout, nte 6/day  #30 x 2   Entered and Authorized by:   Minus Breeding MD   Signed by:   Minus Breeding MD on 06/11/2010   Method used:   Electronically to        [facility] [address] #[id number]* (retail)       [address] [place], [place]  [address]       Ph: [phone]       Fax: [phone]   RxID:   [id number]

## 2010-12-31 NOTE — Progress Notes (Signed)
Summary: Medication ?  Phone Note Call from Patient Call back at Home Phone [phone]   Caller: Patient Call For: Minus Breeding MD Summary of Call: Patient came into the office wanting to know what was going to happen since the PA for his medication was denied. Is he going to get a different prescription? Initial call taken by: Irma Newness,  October 16, 2009 9:59 AM  Follow-up for Phone Call        Dr. Everardo All notified of denial. please advise Follow-up by: Margaret Pyle, CMA,  October 16, 2009 10:13 AM  Additional Follow-up for Phone Call Additional follow up Details #1::        OK to wait on this for dr Everardo All 's return next wk Additional Follow-up by: Corwin Levins MD,  October 16, 2009 10:56 AM    Additional Follow-up for Phone Call Additional follow up Details #2::    please advise pt there is no alternative.  i am happy to rx but it is expensive. Follow-up by: Minus Breeding MD,  October 20, 2009 3:38 PM  Additional Follow-up for Phone Call Additional follow up Details #3:: Details for Additional Follow-up Action Taken: pt would like MD to rx something else, he says he does not mind the cost Additional Follow-up by: Margaret Pyle, CMA,  October 22, 2009 8:24 AM   to clarify, the prescription does not change, but he would just have to buy it himself. Amauri Keefe elliosn, md left message on machine for pt to return my call Margaret Pyle, CMA  October 22, 2009 2:43 PM pt informed Margaret Pyle, CMA  October 22, 2009 4:28 PM

## 2010-12-31 NOTE — Letter (Signed)
Summary: [facility]  [facility]   Imported By: Lennie Odor 09/17/2010 15:48:50  _____________________________________________________________________  External Attachment:    Type:   Image     Comment:   External Document

## 2010-12-31 NOTE — Assessment & Plan Note (Signed)
Summary: 6 MTH FU-STC   Vital Signs:  Patient Profile:   70 Years Old Male Weight:      225.6 pounds Temp:     97.5 degrees F oral Pulse rate:   80 / minute BP sitting:   144 / 90  (left arm) Cuff size:   regular  Vitals Entered By: Orlan Leavens (March 27, 2008 9:12 AM)                 Visit Type:  Follow-up Visit  Chief Complaint:  dm.  History of Present Illness: stopped zetia few mos ago due to cost insufficient help with viagra pt states dm is well-controlled    Current Allergies: No known allergies   Past Medical History:    Reviewed history from 08/25/2007 and no changes required:       Diabetes mellitus, type II       Hepatitis C       Hypertension       Diast Dysfunction on echo     Review of Systems  The patient denies chest pain and dyspnea on exhertion.     Physical Exam  General:     normal appearance.   Extremities:     no edema    Impression & Recommendations:  Problem # 1:  DIABETES MELLITUS, TYPE II (ICD-250.00) well-controlled His updated medication list for this problem includes:    Glucophage 500 Mg Tabs (Metformin hcl) .Marland Kitchen... Take 1 by mouth two times a day qd    Metformin Hcl 1000 Mg Tabs (Metformin hcl)    Actos 30 Mg Tabs (Pioglitazone hcl) .Marland Kitchen... Take 1 by mouth qd  Orders: Est. 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 Take 1 by mouth qd  His updated medication list for this problem includes:    Lopid 600 Mg Tabs (Gemfibrozil) .Marland Kitchen... Take 1 by mouth two times a day qd    Gemfibrozil 600 Mg Tabs (Gemfibrozil)    Colestipol Hcl 1 Gm Tabs (Colestipol hcl) .Marland KitchenMarland KitchenMarland KitchenMarland Kitchen 5 qd   Problem # 3:  ERECTILE DYSFUNCTION (ICD-302.72)  Medications Added to Medication List This Visit: 1)  Norvasc 10 Mg Tabs (Amlodipine besylate) .... Qd 2)  Colestipol Hcl 1 Gm Tabs (Colestipol hcl) .... 5 qd 3)   Levitra 20 Mg Tabs (Vardenafil hcl) .... Qd   Patient Instructions: 1)  same rx for dm 2)  increase norvasc to 10/d 3)  change zetia to colestid 5x1 g qd 4)  cpx 4 mos with a1c and microalb 250.00 5)  change viagra to levitra 20 mg prn    Prescriptions: LEVITRA 20 MG  TABS (VARDENAFIL HCL) qd  #3 x 11   Entered and Authorized by:   Minus Breeding MD   Signed by:   Minus Breeding MD on 03/27/2008   Method used:   Electronically sent to ...       [id number]*       [address]       [place], [place]  [address]       Ph: [phone]       Fax: [phone]   RxID:   [id number] COLESTIPOL HCL 1 GM  TABS (COLESTIPOL HCL) 5 qd  #150 x 11   Entered and Authorized by:   Minus Breeding MD   Signed by:   Gregary Signs  Ardeen Garland MD on 03/27/2008   Method used:   Electronically sent to ...       [facility] #[id number]*       [address]       [place], [place]  [address]       Ph: [phone]       Fax: [phone]   RxID:   [id number] NORVASC 10 MG  TABS (AMLODIPINE BESYLATE) qd  #30 x 11   Entered and Authorized by:   Minus Breeding MD   Signed by:   Minus Breeding MD on 03/27/2008   Method used:   Electronically sent to ...       [facility] #[id number]*       [address]       [place], [place]  [address]       Ph: [phone]       Fax: [phone]   RxID:   [id number]  ]

## 2010-12-31 NOTE — Progress Notes (Signed)
Summary: rx request  Phone Note Call from Patient Call back at Home Phone [phone]   Caller: Patient Summary of Call: patient called requesting prescription refill for pain meds Initial call taken by: Sydell Axon,  February 04, 2010 9:15 AM  Follow-up for Phone Call        i printed refill f/u ov is needed Follow-up by: Minus Breeding MD,  February 04, 2010 12:35 PM  Additional Follow-up for Phone Call Additional follow up Details #1::        patient has been informed Additional Follow-up by: Sydell Axon,  February 04, 2010 12:52 PM    Prescriptions: ENDOCET 10-325 MG TABS (OXYCODONE-ACETAMINOPHEN) 1 q6h as needed pain  #50 x 0   Entered and Authorized by:   Minus Breeding MD   Signed by:   Minus Breeding MD on 02/04/2010   Method used:   Print then Give to Patient   RxID:   [id number]

## 2010-12-31 NOTE — Progress Notes (Signed)
  Phone Note Call from Patient Call back at Work Phone [phone]   Caller: Patient Call For: Dr Everardo All Summary of Call: Pt states right foot is swollen and painful and he is having a gout flare-up. Pt is requesting medicine be called in to [facility].He states it has been phoned in before.Please advise. Initial call taken by: Verdell Face,  November 05, 2009 9:12 AM  Follow-up for Phone Call        f/o appt is due.  please come in today or tomorrow. Follow-up by: Minus Breeding MD,  November 05, 2009 9:19 AM  Additional Follow-up for Phone Call Additional follow up Details #1::        pt informed, transferred to schedulers Additional Follow-up by: Margaret Pyle, CMA,  November 05, 2009 10:28 AM

## 2011-02-03 ENCOUNTER — Telehealth: Payer: Self-pay | Admitting: Endocrinology

## 2011-02-11 NOTE — Progress Notes (Signed)
Summary: Rx refill req  Phone Note Refill Request Message from:  Patient on February 03, 2011 11:27 AM  Refills Requested: Medication #1:  ENDOCET 10-325 MG TABS 1 q6h as needed pain   Dosage confirmed as above?Dosage Confirmed   Supply Requested: 1 month  Medication #2:  COLCHICINE 0.6 MG TABS 1 qhr prn gout   Dosage confirmed as above?Dosage Confirmed   Supply Requested: 3 months Initial call taken by: Margaret Pyle, CMA,  February 03, 2011 11:27 AM  Follow-up for Phone Call        Pt advised, Rxs in cabinet for pt pick up Follow-up by: Margaret Pyle, CMA,  February 03, 2011 1:57 PM    Prescriptions: ENDOCET 10-325 MG TABS (OXYCODONE-ACETAMINOPHEN) 1 q6h as needed pain  #50 x 0   Entered and Authorized by:   Minus Breeding MD   Signed by:   Minus Breeding MD on 02/03/2011   Method used:   Printed then faxed to ...       [id number]* (retail)       [address] [place], [place]  [address]       Ph: [phone]       Fax: [phone]   RxID:   [id number] COLCHICINE 0.6 MG TABS (COLCHICINE) 1 qhr prn gout, nte 6/day  #30 x 2   Entered and Authorized by:   Minus Breeding MD   Signed by:   Minus Breeding MD on 02/03/2011   Method used:   Printed then faxed to ...       [facility] #[id number]* (retail)       [address] [place], [place]  [address]       Ph: [phone]       Fax: [phone]   RxID:   [id number]  i printed cpx is due

## 2011-03-27 ENCOUNTER — Other Ambulatory Visit: Payer: Self-pay

## 2011-03-27 MED ORDER — OXYCODONE-ACETAMINOPHEN 10-325 MG PO TABS: 1.0000 | ORAL_TABLET | Freq: Four times a day (QID) | ORAL | Status: AC | PRN

## 2011-03-27 NOTE — Telephone Encounter (Signed)
Pt called requesting refill of pain med, last written 02/03/2011

## 2011-03-27 NOTE — Telephone Encounter (Signed)
Pt does not have upcoming appt, would you like me to advise CPX?

## 2011-03-27 NOTE — Telephone Encounter (Signed)
Yes.  When pt makes appt for cpx soon, i'll refill until then.

## 2011-03-27 NOTE — Telephone Encounter (Signed)
Did pt make cpx appt?

## 2011-03-27 NOTE — Telephone Encounter (Signed)
Rx given to pt. 

## 2011-03-27 NOTE — Telephone Encounter (Signed)
i printed 

## 2011-03-27 NOTE — Telephone Encounter (Signed)
Pt made appt for 5/9 for cpx.

## 2011-04-09 ENCOUNTER — Ambulatory Visit (INDEPENDENT_AMBULATORY_CARE_PROVIDER_SITE_OTHER): Payer: Medicaid Other | Admitting: Endocrinology

## 2011-04-09 ENCOUNTER — Encounter: Payer: Self-pay | Admitting: Endocrinology

## 2011-04-09 ENCOUNTER — Other Ambulatory Visit (INDEPENDENT_AMBULATORY_CARE_PROVIDER_SITE_OTHER): Payer: Medicare Other | Admitting: Endocrinology

## 2011-04-09 ENCOUNTER — Other Ambulatory Visit (INDEPENDENT_AMBULATORY_CARE_PROVIDER_SITE_OTHER): Payer: Medicare Other

## 2011-04-09 ENCOUNTER — Ambulatory Visit (INDEPENDENT_AMBULATORY_CARE_PROVIDER_SITE_OTHER)
Admission: RE | Admit: 2011-04-09 | Discharge: 2011-04-09 | Disposition: A | Discharge status: Home / Self Care | Payer: Medicaid Other | Source: Ambulatory Visit | Attending: Endocrinology | Admitting: Endocrinology

## 2011-04-09 DIAGNOSIS — E781 Pure hyperglyceridemia: Secondary | ICD-10-CM

## 2011-04-09 DIAGNOSIS — B1710 Acute hepatitis C without hepatic coma: Secondary | ICD-10-CM

## 2011-04-09 DIAGNOSIS — M109 Gout, unspecified: Secondary | ICD-10-CM

## 2011-04-09 DIAGNOSIS — I10 Essential (primary) hypertension: Secondary | ICD-10-CM

## 2011-04-09 DIAGNOSIS — M545 Low back pain, unspecified: Secondary | ICD-10-CM

## 2011-04-09 DIAGNOSIS — E291 Testicular hypofunction: Secondary | ICD-10-CM

## 2011-04-09 DIAGNOSIS — E119 Type 2 diabetes mellitus without complications: Secondary | ICD-10-CM

## 2011-04-09 DIAGNOSIS — Z125 Encounter for screening for malignant neoplasm of prostate: Secondary | ICD-10-CM | POA: Insufficient documentation

## 2011-04-09 LAB — HEPATIC FUNCTION PANEL
ALT: 48 U/L (ref 0–53)
AST: 35 U/L (ref 0–37)
Albumin: 4.2 g/dL (ref 3.5–5.2)
Alkaline Phosphatase: 62 U/L (ref 39–117)
Bilirubin, Direct: 0.3 mg/dL (ref 0.0–0.3)
Total Bilirubin: 1.3 mg/dL — ABNORMAL HIGH (ref 0.3–1.2)
Total Protein: 7.2 g/dL (ref 6.0–8.3)

## 2011-04-09 LAB — PSA: PSA: 1.96 ng/mL (ref 0.10–4.00)

## 2011-04-09 LAB — MICROALBUMIN / CREATININE URINE RATIO
Creatinine,U: 71.6 mg/dL
Microalb Creat Ratio: 0.7 mg/g (ref 0.0–30.0)
Microalb, Ur: 0.5 mg/dL (ref 0.0–1.9)

## 2011-04-09 LAB — URINALYSIS, ROUTINE W REFLEX MICROSCOPIC
Bilirubin Urine: NEGATIVE
Hgb urine dipstick: NEGATIVE
Ketones, ur: NEGATIVE
Leukocytes, UA: NEGATIVE
Nitrite: NEGATIVE
Specific Gravity, Urine: 1.015 (ref 1.000–1.030)
Total Protein, Urine: NEGATIVE
Urine Glucose: NEGATIVE
Urobilinogen, UA: 0.2 (ref 0.0–1.0)
pH: 5.5 (ref 5.0–8.0)

## 2011-04-09 LAB — BASIC METABOLIC PANEL
BUN: 11 mg/dL (ref 6–23)
CO2: 27 mEq/L (ref 19–32)
Calcium: 9.2 mg/dL (ref 8.4–10.5)
Chloride: 103 mEq/L (ref 96–112)
Creatinine, Ser: 0.8 mg/dL (ref 0.4–1.5)
GFR: 97.06 mL/min (ref >60.00)
Glucose, Bld: 125 mg/dL — ABNORMAL HIGH (ref 70–99)
Potassium: 4.4 mEq/L (ref 3.5–5.1)
Sodium: 138 mEq/L (ref 135–145)

## 2011-04-09 LAB — CBC WITH DIFFERENTIAL/PLATELET
HCT: 45.8 % (ref 39.0–52.0)
Hemoglobin: 16.1 g/dL (ref 13.0–17.0)
MCHC: 35.2 g/dL (ref 30.0–36.0)
MCV: 90.6 fl (ref 78.0–100.0)
Platelets: 224 10*3/uL (ref 150.0–400.0)
RBC: 5.05 Mil/uL (ref 4.22–5.81)
RDW: 12.7 % (ref 11.5–14.6)
WBC: 7.1 10*3/uL (ref 4.5–10.5)

## 2011-04-09 LAB — LIPID PANEL
Cholesterol: 144 mg/dL (ref 0–200)
HDL: 37.7 mg/dL — ABNORMAL LOW (ref >39.00)
LDL Cholesterol: 73 mg/dL (ref 0–99)
Total CHOL/HDL Ratio: 4
Triglycerides: 165 mg/dL — ABNORMAL HIGH (ref 0.0–149.0)
VLDL: 33 mg/dL (ref 0.0–40.0)

## 2011-04-09 LAB — SEDIMENTATION RATE: Sed Rate: 4 mm/hr (ref 0–22)

## 2011-04-09 LAB — HEMOGLOBIN A1C: Hgb A1c MFr Bld: 6.2 % (ref 4.6–6.5)

## 2011-04-09 LAB — URIC ACID: Uric Acid, Serum: 7.7 mg/dL (ref 4.0–7.8)

## 2011-04-09 LAB — TESTOSTERONE: Testosterone: 462.67 ng/dL (ref 350.00–890.00)

## 2011-04-09 LAB — TSH: TSH: 1.29 u[IU]/mL (ref 0.35–5.50)

## 2011-04-09 MED ORDER — OXYCODONE-ACETAMINOPHEN 10-325 MG PO TABS: 1.0000 | ORAL_TABLET | Freq: Four times a day (QID) | ORAL | Status: DC | PRN

## 2011-04-09 NOTE — Patient Instructions (Addendum)
blood tests, and x-rays, are being ordered for you today.  please call [phone] to hear your test results.  You will be prompted to enter the 9-digit "MRN" number that appears at the top left of this page, followed by #.  Then you will hear the message. pending the test results, please continue the same medications for now please consider these measures for your health:  minimize alcohol.  do not use tobacco products.  have a colonoscopy at least every 10 years from age 48.  keep firearms safely stored.  always use seat belts.  have working smoke alarms in your home.  see an eye doctor and dentist regularly.  never drive under the influence of alcohol or drugs (including prescription drugs).  those with fair skin should take precautions against the sun. please let me know what your wishes would be, if artificial life support measures should become necessary.  it is critically important to prevent falling down (keep floor areas well-lit, dry, and free of loose objects) good diet and exercise habits significanly improve the control of your diabetes.  please let me know if you wish to be referred to a dietician.  high blood sugar is very risky to your health.  you should see an eye doctor every year. controlling your blood pressure and cholesterol drastically reduces the damage diabetes does to your body.  this also applies to quitting smoking.  please discuss these with your doctor.  you should take an aspirin every day, unless you have been advised by a doctor not to. Please make a follow-up appointment in 6 months (update: i left message on phone-tree:  If you are not taking metformin, you should start.  i offered ref ortho).

## 2011-04-09 NOTE — Progress Notes (Signed)
Subjective:    Patient ID: Larry Robertson, male    DOB: 26-Sep-1941, 70 y.o.   MRN: [id number]  HPI Subjective:   Patient here for Medicare annual wellness visit and management of other chronic and acute problems.     Risk factors: advanced age    Roster of Physicians Providing Medical Care to Patient: none  Activities of Daily Living: In your present state of health, do you have any difficulty performing the following activities?:  Preparing food and eating?: No  Bathing yourself: No  Getting dressed: No  Using the toilet:No  Moving around from place to place: No  In the past year have you fallen or had a near fall?:No    Home Safety: Has smoke detector and wears seat belts. No firearms. No excess sun exposure.  Diet and Exercise  Current exercise habits: pt says "ok." Dietary issues discussed: pt reports healthy diet   Depression Screen  Q1: Over the past two weeks, have you felt down, depressed or hopeless?no  Q2: Over the past two weeks, have you felt little interest or pleasure in doing things? no   The following portions of the patient's history were reviewed and updated as appropriate: allergies, current medications, past family history, past medical history, past social history, past surgical history and problem list.  Past Medical History  Diagnosis Date  . HEPATITIS C 08/25/2007  . DIABETES MELLITUS, TYPE II 08/25/2007  . HYPOGONADISM 09/27/2009  . 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  . 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 ERECTILE DYSFUNCTION 08/25/2007  . HYPERTENSION 08/25/2007  . 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 Diastolic dysfunction     on echo    Past Surgical History  Procedure Date  . Lumbar fusion 2005    History   Social History  . Marital Status: Single    Spouse Name: N/A    Number of Children: N/A  . 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 Smoking status:  Never Smoker   . Smokeless tobacco: Not on file  . Alcohol Use: Not on file  . Drug Use: Not on file  . Sexually Active: Not on file   Other Topics Concern  . Not on file   Social History Narrative   Divorced    Current Outpatient Prescriptions on File Prior to Visit  Medication Sig Dispense Refill  . allopurinol (ZYLOPRIM) 300 MG tablet Take 300 mg by mouth daily.        Marland Kitchen amLODipine (NORVASC) 10 MG tablet Take 10 mg by mouth daily.        Marland Kitchen aspirin 325 MG tablet Take 325 mg by mouth daily.        . colchicine 0.6 MG tablet 1 tablet every hour as needed for gout, not to exceed 6/day       . lisinopril (PRINIVIL,ZESTRIL) 40 MG tablet Take 40 mg by mouth daily.        Marland Kitchen oxyCODONE-acetaminophen (PERCOCET) 10-325 MG per tablet Take 1 tablet by mouth every 6 (six) hours as needed. For pain       . metFORMIN (GLUCOPHAGE) 1000 MG tablet Take 1,000 mg by mouth 2 (two) times daily with a meal.        . Multiple Vitamins-Minerals (CENTRUM SILVER ULTRA MENS PO) Take 1 tablet by mouth daily.        . Testosterone (ANDROGEL) 25 MG/2.5GM GEL 2.5 grams once daily (Dr. Aldean Ast)  Allergies  Allergen Reactions  . Pioglitazone     REACTION: edema    Family History  Problem Relation Age of Onset  . Cancer Neg Hx     BP 148/96  Pulse 69  Temp(Src) 97.5 F (36.4 C) (Oral)  Ht 6' 2.5" (1.892 m)  Wt 214 lb 6.4 oz (97.251 kg)  BMI 27.16 kg/m2  SpO2 97%   Review of Systems  Denies hearing loss, and visual loss Objective:   Vision:  Pt is advised to See opthalmologist Hearing: grossly normal Body mass index:  See vs page Msk: pt easily and quickly performs "get-up-and-go" from a sitting position Cognitive Impairment Assessment: cognition, memory and judgment appear normal.  remembers 3/3 at 5 minutes.  excellent recall.  can easily read and write a sentence.  alert and oriented x 3.   Assessment:   Medicare wellness utd on preventive parameters    Plan:   During the  course of the visit the patient was educated and counseled about appropriate screening and preventive services including:       Fall prevention   Diabetes is rechecked today Nutrition counseling is offered--see instruction page.  Vaccines / LABS Zostavax / Pnemonccoal Vaccine  today  PSA  Patient Instructions (the written plan) was given to the patient.        Review of Systems     Objective:   Physical Exam        Assessment & Plan:  SEPARATE EVALUATION FOLLOWS--EACH PROBLEM HERE IS NEW, NOT RESPONDING TO TREATMENT, OR POSES SIGNIFICANT RISK TO THE PATIENT'S HEALTH: HISTORY OF THE PRESENT ILLNESS: Pt says he did no take bp med yet today.  He says bp last week was 115/71. He has 1  Month of moderate pain at the lower back, but no assoc radiation. Increasing a1c is noted PAST MEDICAL HISTORY reviewed and up to date today REVIEW OF SYSTEMS: Hr has lost a few lbs, due to his efforts.  Denies numbness PHYSICAL EXAMINATION: spine is nontender Pulses: dorsalis pedis intact bilat.   Feet: no deformity.  no ulcer on the feet.  feet are of normal color and temp.  no edema Neuro: sensation is intact to touch on the feet LAB/XRAY RESULTS: a1c is noted IMPRESSION: Htn.  Therapy limited by intermittent noncompliance. Low-back pain, persistent Dm, needs increased rx PLAN: See instruction page

## 2011-04-10 ENCOUNTER — Other Ambulatory Visit: Payer: Self-pay | Admitting: Endocrinology

## 2011-04-13 ENCOUNTER — Other Ambulatory Visit: Payer: Self-pay | Admitting: Endocrinology

## 2011-04-18 NOTE — Op Note (Signed)
NAME:  Larry Robertson, Larry Robertson                 ACCOUNT NO.:  [id number]   MEDICAL RECORD NO.:  [id number]          PATIENT TYPE:  INP   LOCATION:  3018                         FACILITY:  [facility]   PHYSICIAN:  Danae Orleans. Venetia Maxon, M.D.  DATE OF BIRTH:  December 05, 1940   DATE OF PROCEDURE:  12/13/2004  DATE OF DISCHARGE:                                 OPERATIVE REPORT   PREOPERATIVE DIAGNOSES:  1.  Herniated cervical disk with,  2.  Cervical spondylosis,  3.  Degenerative disk disease; and,  4.  Cervical radiculopathy at the cervical 4-5 level.   POSTOPERATIVE DIAGNOSES:  1.  Herniated cervical disk with,  2.  Cervical spondylosis,  3.  Degenerative disk disease; and,  4.  Cervical radiculopathy at the cervical 4-5 level.   OPERATION PERFORMED:  Anterior cervical decompression and fusion, cervical 4-  5, with allograft bone graft and anterior cervical plate.   SURGEON:  Danae Orleans. Venetia Maxon, M.D.   ASSISTANT:  Coletta Memos, M.D.   ANESTHESIA:  General endotracheal anesthesia.   ESTIMATED BLOOD LOSS:  One-hundred milliliters.   COMPLICATIONS:  None.   DISPOSITION:  Recovery.   INDICATIONS:  Larry Robertson is a 70 year old man with left deltoid atrophy and  weakness who has rotator cuff pathology, but also a C4-5 disk herniation,  which was identified on a preop myelogram with a nerve root cutoff at the C4-  5 level on the left.  It was therefore elected to take him to surgery for  anterior cervical decompression and fusion at the C4-5 level.   DESCRIPTION OF OPERATION:  [name] was brought to the operating room.  Following satisfactory and uncomplicated induction of general endotracheal  anesthesia and placement of intravenous lines the patient was placed in the  supine position on the operating table.  The neck was placed in slight  extension and we then placed 10 pounds of Halter traction.  His anterior  neck was then prepped and draped in the usual sterile fashion.  The area of  planned  incision was infiltrated with a quarter percent Marcaine and half  percent lidocaine with 1:300,000 epinephrine.   An incision was made onto the anterior border of the sternocleidomastoid  muscle and carried sharply to the platysmal area.  Subplatysmal dissection  was performed exposing the anterior portion of the sternocleidomastoid  muscle.  Using blunt dissection the carotid sheath was kept lateral, and  trachea and esophagus were kept medial exposing the anterior cervical spine.  ________ spinal needle placed above the C4-5 level and this was confirmed by  an intraoperative x-ray.  The longus colli muscles were taken down from the  anterior cervical spine from C4 through C5 levels bilaterally using  electrocautery and Key elevator.  A self-retaining ___________ retractor was  placed to facilitate exposure.  The interspace was incised with a 15 blade  and disk material was removed in a piecemeal fashion.  Disk space spreader  was placed and using the high-speed drill under microscopic visualization  the endplates of C4 and C5 were decorticated, and bone was preserved from  the drilling, and  set to use later in the bone grafting.  The posterior  longitudinal ligament was then incised with hook followed by Kerrison  rongeur and this was removed resulting in decompression of the central  spinal cord dura and both the C4 and 5 neural foramina.  There was a  spondylitic ridge along the superior aspect of the interspace at the  overlying 4 on the left, which was causing compression of the left C5 nerve  root; and, this was relieved.  Subsequently hemostasis was assured with  Gelfoam soaked in thrombin.  An 8 mm machine cortical allograft bone wedge  was then selected, packed with morselized bone autograft inserted in the  interspace and countersunk appropriately.  A 16-mm anterior cervical plate  was then affixed to the anterior cervical spine using variable angle 14 mm  screws.  All screws  had excellent purchase and locking mechanism was  engaged.   Final x-ray confirmed well positioned bone graft and anterior cervical  plate.  Halter traction was removed prior to placing the plate.   The wound was then copiously irrigated with bacitracin and saline.  The soft  tissues were inspected and found to be in good repair.  The platysmal layer  was then reapproximated with 3-0 Vicryl and the skin edges were  reapproximated with interrupted 3-0 Vicryl subcuticular stitch.  The wound  was dressed with Dermabond.   The patient was extubated in the operating room and was taken to the  recovery room in stable and satisfactory condition having tolerated his  operation well.   COUNTS:  The counts were correct at the end of the case.      JDS/MEDQ  D:  12/13/2004  T:  12/14/2004  Job:  [id number]

## 2011-04-22 ENCOUNTER — Ambulatory Visit: Payer: Medicare Other | Attending: Sports Medicine | Admitting: Physical Therapy

## 2011-04-22 DIAGNOSIS — M2569 Stiffness of other specified joint, not elsewhere classified: Secondary | ICD-10-CM | POA: Insufficient documentation

## 2011-04-22 DIAGNOSIS — IMO0001 Reserved for inherently not codable concepts without codable children: Secondary | ICD-10-CM | POA: Insufficient documentation

## 2011-04-22 DIAGNOSIS — M545 Low back pain, unspecified: Secondary | ICD-10-CM | POA: Insufficient documentation

## 2011-04-24 ENCOUNTER — Ambulatory Visit: Payer: Medicare Other | Admitting: Physical Therapy

## 2011-04-30 ENCOUNTER — Ambulatory Visit: Payer: Medicare Other

## 2011-05-02 ENCOUNTER — Ambulatory Visit: Payer: Medicare Other | Attending: Sports Medicine

## 2011-05-02 DIAGNOSIS — M2569 Stiffness of other specified joint, not elsewhere classified: Secondary | ICD-10-CM | POA: Insufficient documentation

## 2011-05-02 DIAGNOSIS — IMO0001 Reserved for inherently not codable concepts without codable children: Secondary | ICD-10-CM | POA: Insufficient documentation

## 2011-05-02 DIAGNOSIS — M545 Low back pain, unspecified: Secondary | ICD-10-CM | POA: Insufficient documentation

## 2011-05-07 ENCOUNTER — Ambulatory Visit: Payer: Medicare Other | Admitting: Physical Therapy

## 2011-05-09 ENCOUNTER — Ambulatory Visit: Payer: Medicare Other | Admitting: Physical Therapy

## 2011-09-04 ENCOUNTER — Other Ambulatory Visit: Payer: Self-pay

## 2011-09-04 MED ORDER — OXYCODONE-ACETAMINOPHEN 10-325 MG PO TABS: 1.0000 | ORAL_TABLET | Freq: Four times a day (QID) | ORAL | Status: DC | PRN

## 2011-09-04 NOTE — Telephone Encounter (Signed)
i printed refill x 1 "Medicare wellness" ov is due

## 2011-09-04 NOTE — Telephone Encounter (Signed)
Pt informed, Rx in cabinet for pt pick up  

## 2011-10-08 ENCOUNTER — Other Ambulatory Visit (INDEPENDENT_AMBULATORY_CARE_PROVIDER_SITE_OTHER): Payer: Medicare Other

## 2011-10-08 ENCOUNTER — Ambulatory Visit (INDEPENDENT_AMBULATORY_CARE_PROVIDER_SITE_OTHER): Payer: Medicare Other | Admitting: Endocrinology

## 2011-10-08 ENCOUNTER — Encounter: Payer: Self-pay | Admitting: Endocrinology

## 2011-10-08 VITALS — BP 132/78 | HR 72 | Temp 97.6°F | Ht 74.0 in | Wt 216.4 lb

## 2011-10-08 DIAGNOSIS — E119 Type 2 diabetes mellitus without complications: Secondary | ICD-10-CM

## 2011-10-08 DIAGNOSIS — M5410 Radiculopathy, site unspecified: Secondary | ICD-10-CM

## 2011-10-08 DIAGNOSIS — H538 Other visual disturbances: Secondary | ICD-10-CM

## 2011-10-08 DIAGNOSIS — B1710 Acute hepatitis C without hepatic coma: Secondary | ICD-10-CM

## 2011-10-08 DIAGNOSIS — IMO0002 Reserved for concepts with insufficient information to code with codable children: Secondary | ICD-10-CM

## 2011-10-08 LAB — HEMOGLOBIN A1C: Hgb A1c MFr Bld: 6 % (ref 4.6–6.5)

## 2011-10-08 MED ORDER — OXYCODONE-ACETAMINOPHEN 10-325 MG PO TABS: 1.0000 | ORAL_TABLET | Freq: Four times a day (QID) | ORAL | Status: DC | PRN

## 2011-10-08 NOTE — Progress Notes (Signed)
Subjective:    Patient ID: Larry Robertson, male    DOB: 12/25/1940, 70 y.o.   MRN: [id number]  HPI Pt returns for f/u of type 2 dm, for which he has taken metformin.  He says his diet is good. He reports a few mos of mild blurry vision from both eyes.  No assoc pain. Pt reports a few mos of moderate pain at the left lateal thigh, and assoc numbness.  He has seen ortho and PT-no help.  He has had mri for this before.   Past Medical History  Diagnosis Date  . HEPATITIS C 08/25/2007  . DIABETES MELLITUS, TYPE II 08/25/2007  . HYPOGONADISM 09/27/2009  . 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  . 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 ERECTILE DYSFUNCTION 08/25/2007  . HYPERTENSION 08/25/2007  . 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 Diastolic dysfunction     on echo    Past Surgical History  Procedure Date  . Lumbar fusion 2005    History   Social History  . Marital Status: Single    Spouse Name: N/A    Number of Children: N/A  . 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 Smoking status: Never Smoker   . Smokeless tobacco: Not on file  . Alcohol Use: Not on file  . Drug Use: Not on file  . Sexually Active: Not on file   Other Topics Concern  . Not on file   Social History Narrative   Divorced    Current Outpatient Prescriptions on File Prior to Visit  Medication Sig Dispense Refill  . allopurinol (ZYLOPRIM) 300 MG tablet Take 300 mg by mouth daily.        Marland Kitchen amLODipine (NORVASC) 10 MG tablet TAKE 1 TABLET BY MOUTH DAILY  30 tablet  11  . aspirin 325 MG tablet Take 325 mg by mouth daily.        . colchicine 0.6 MG tablet 1 tablet every hour as needed for gout, not to exceed 6/day       . lisinopril (PRINIVIL,ZESTRIL) 40 MG tablet TAKE 1 TABLET BY MOUTH EVERY DAY  30 tablet  11  . metFORMIN (GLUCOPHAGE) 1000 MG tablet TAKE 1 TABLET BY MOUTH TWICE DAILY  60 tablet  5  . Multiple Vitamins-Minerals  (CENTRUM SILVER ULTRA MENS PO) Take 1 tablet by mouth daily.        . Testosterone (ANDROGEL) 25 MG/2.5GM GEL 2.5 grams once daily (Dr. Aldean Ast)         Allergies  Allergen Reactions  . Pioglitazone     REACTION: edema    Family History  Problem Relation Age of Onset  . Cancer Neg Hx     BP 132/78  Pulse 72  Temp(Src) 97.6 F (36.4 C) (Oral)  Ht 6\' 2"  (1.88 m)  Wt 216 lb 6.4 oz (98.158 kg)  BMI 27.78 kg/m2  SpO2 95%    Review of Systems Denies headache and weight change.      Objective:   Physical Exam Pulses: dorsalis pedis intact bilat.   Feet: no deformity.  no ulcer on the feet.  feet are of normal color and temp.  no edema Neuro: sensation is intact to touch on the feet gait is normal and steady.      Lab Results  Component Value Date   HGBA1C 6.0 10/08/2011      Assessment & Plan:  Type 2 DM, well-controlled blurry  vision, new.  usually due to refractive error, but he is overdue for checkup anyway.   left leg radiculopathy, chronic, recurrent

## 2011-10-08 NOTE — Patient Instructions (Addendum)
blood tests are being requested for you today.  please call [phone] to hear your test results.  You will be prompted to enter the 9-digit "MRN" number that appears at the top left of this page, followed by #.  Then you will hear the message. Please come back for a "medicare wellness" appointment in 7 months. (update: i left message on phone-tree:  Same rx)

## 2011-10-14 ENCOUNTER — Telehealth: Payer: Self-pay | Admitting: Endocrinology

## 2011-10-14 NOTE — Telephone Encounter (Signed)
Received 2 pages from [facility]; forwarded to Dr. Everardo All for review. 10/14/11-ar

## 2011-10-20 ENCOUNTER — Encounter: Payer: Self-pay | Admitting: Endocrinology

## 2011-12-01 ENCOUNTER — Telehealth: Payer: Self-pay | Admitting: *Deleted

## 2011-12-01 MED ORDER — OXYCODONE-ACETAMINOPHEN 10-325 MG PO TABS: 1.0000 | ORAL_TABLET | Freq: Four times a day (QID) | ORAL | Status: DC | PRN

## 2011-12-01 NOTE — Telephone Encounter (Signed)
Pt informed, Rx in cabinet for pt pick up  

## 2011-12-01 NOTE — Telephone Encounter (Signed)
Done hardcopy to robin  

## 2011-12-01 NOTE — Telephone Encounter (Signed)
Pt is requesting refill of Percocet-last written 10/08/2011 #100 with 0 refills-SAE pt, please advise, thanks.

## 2012-01-13 ENCOUNTER — Other Ambulatory Visit: Payer: Self-pay | Admitting: *Deleted

## 2012-01-13 MED ORDER — OXYCODONE-ACETAMINOPHEN 10-325 MG PO TABS: 1.0000 | ORAL_TABLET | Freq: Four times a day (QID) | ORAL | Status: DC | PRN

## 2012-01-13 NOTE — Telephone Encounter (Signed)
Pt informed rx ready for pickup. Rx placed upfront in cabinet.  

## 2012-01-13 NOTE — Telephone Encounter (Signed)
R'cd call from pt requesting Percocet refill-last written 11/30/2012 #100 with 0 refils-please advise.

## 2012-02-17 ENCOUNTER — Other Ambulatory Visit: Payer: Self-pay | Admitting: *Deleted

## 2012-02-17 MED ORDER — OXYCODONE-ACETAMINOPHEN 10-325 MG PO TABS: 1.0000 | ORAL_TABLET | Freq: Four times a day (QID) | ORAL | Status: DC | PRN

## 2012-02-17 NOTE — Telephone Encounter (Signed)
Pt called requesting refill of Percocet-last written 01/13/2012 #100 with 0 refills-please advise

## 2012-02-17 NOTE — Telephone Encounter (Signed)
i printed 

## 2012-02-17 NOTE — Telephone Encounter (Signed)
Pt informed rx ready for pickup. Rx placed upfront in cabinet.  

## 2012-03-31 ENCOUNTER — Other Ambulatory Visit: Payer: Self-pay | Admitting: *Deleted

## 2012-03-31 MED ORDER — OXYCODONE-ACETAMINOPHEN 10-325 MG PO TABS: 1.0000 | ORAL_TABLET | Freq: Four times a day (QID) | ORAL | Status: DC | PRN

## 2012-03-31 NOTE — Telephone Encounter (Signed)
Pt called requesting refill of Percocet-last written 02/17/2012 #100 with 0 refills-please advise.

## 2012-04-05 ENCOUNTER — Other Ambulatory Visit: Payer: Self-pay | Admitting: Endocrinology

## 2012-04-08 ENCOUNTER — Other Ambulatory Visit: Payer: Self-pay | Admitting: Endocrinology

## 2012-04-15 ENCOUNTER — Ambulatory Visit (INDEPENDENT_AMBULATORY_CARE_PROVIDER_SITE_OTHER): Payer: Medicare Other | Admitting: Endocrinology

## 2012-04-15 ENCOUNTER — Encounter: Payer: Self-pay | Admitting: Gastroenterology

## 2012-04-15 ENCOUNTER — Telehealth: Payer: Self-pay | Admitting: *Deleted

## 2012-04-15 ENCOUNTER — Encounter: Payer: Self-pay | Admitting: Endocrinology

## 2012-04-15 ENCOUNTER — Other Ambulatory Visit (INDEPENDENT_AMBULATORY_CARE_PROVIDER_SITE_OTHER): Payer: Medicare Other

## 2012-04-15 VITALS — BP 136/82 | HR 68 | Temp 97.2°F | Ht 74.0 in | Wt 211.0 lb

## 2012-04-15 DIAGNOSIS — E291 Testicular hypofunction: Secondary | ICD-10-CM

## 2012-04-15 DIAGNOSIS — E119 Type 2 diabetes mellitus without complications: Secondary | ICD-10-CM

## 2012-04-15 DIAGNOSIS — Z136 Encounter for screening for cardiovascular disorders: Secondary | ICD-10-CM

## 2012-04-15 DIAGNOSIS — Z125 Encounter for screening for malignant neoplasm of prostate: Secondary | ICD-10-CM

## 2012-04-15 DIAGNOSIS — Z79899 Other long term (current) drug therapy: Secondary | ICD-10-CM

## 2012-04-15 DIAGNOSIS — R1310 Dysphagia, unspecified: Secondary | ICD-10-CM

## 2012-04-15 DIAGNOSIS — Z0000 Encounter for general adult medical examination without abnormal findings: Secondary | ICD-10-CM

## 2012-04-15 DIAGNOSIS — I10 Essential (primary) hypertension: Secondary | ICD-10-CM

## 2012-04-15 DIAGNOSIS — B1710 Acute hepatitis C without hepatic coma: Secondary | ICD-10-CM

## 2012-04-15 DIAGNOSIS — IMO0001 Reserved for inherently not codable concepts without codable children: Secondary | ICD-10-CM

## 2012-04-15 DIAGNOSIS — M109 Gout, unspecified: Secondary | ICD-10-CM

## 2012-04-15 DIAGNOSIS — E781 Pure hyperglyceridemia: Secondary | ICD-10-CM

## 2012-04-15 LAB — BASIC METABOLIC PANEL
BUN: 12 mg/dL (ref 6–23)
CO2: 26 mEq/L (ref 19–32)
Calcium: 9.2 mg/dL (ref 8.4–10.5)
Chloride: 103 mEq/L (ref 96–112)
Creatinine, Ser: 0.7 mg/dL (ref 0.4–1.5)
GFR: 114.04 mL/min (ref >60.00)
Glucose, Bld: 219 mg/dL — ABNORMAL HIGH (ref 70–99)
Potassium: 3.9 mEq/L (ref 3.5–5.1)
Sodium: 137 mEq/L (ref 135–145)

## 2012-04-15 LAB — CBC WITH DIFFERENTIAL/PLATELET
Basophils Absolute: 0 10*3/uL (ref 0.0–0.1)
Basophils Relative: 0.5 % (ref 0.0–3.0)
Eosinophils Absolute: 0.4 10*3/uL (ref 0.0–0.7)
Eosinophils Relative: 5.7 % — ABNORMAL HIGH (ref 0.0–5.0)
HCT: 44.8 % (ref 39.0–52.0)
Hemoglobin: 15.2 g/dL (ref 13.0–17.0)
Lymphocytes Relative: 31 % (ref 12.0–46.0)
Lymphs Abs: 2 10*3/uL (ref 0.7–4.0)
MCHC: 33.9 g/dL (ref 30.0–36.0)
MCV: 92.2 fl (ref 78.0–100.0)
Monocytes Absolute: 0.5 10*3/uL (ref 0.1–1.0)
Monocytes Relative: 7.4 % (ref 3.0–12.0)
Neutro Abs: 3.5 10*3/uL (ref 1.4–7.7)
Neutrophils Relative %: 55.4 % (ref 43.0–77.0)
Platelets: 196 10*3/uL (ref 150.0–400.0)
RBC: 4.86 Mil/uL (ref 4.22–5.81)
RDW: 12.7 % (ref 11.5–14.6)
WBC: 6.4 10*3/uL (ref 4.5–10.5)

## 2012-04-15 LAB — URINALYSIS, ROUTINE W REFLEX MICROSCOPIC
Bilirubin Urine: NEGATIVE
Hgb urine dipstick: NEGATIVE
Ketones, ur: NEGATIVE
Leukocytes, UA: NEGATIVE
Nitrite: NEGATIVE
Specific Gravity, Urine: 1.02 (ref 1.000–1.030)
Total Protein, Urine: NEGATIVE
Urine Glucose: 500
Urobilinogen, UA: 0.2 (ref 0.0–1.0)
pH: 5.5 (ref 5.0–8.0)

## 2012-04-15 LAB — LIPID PANEL
Cholesterol: 143 mg/dL (ref 0–200)
HDL: 46.9 mg/dL (ref >39.00)
LDL Cholesterol: 64 mg/dL (ref 0–99)
Total CHOL/HDL Ratio: 3
Triglycerides: 162 mg/dL — ABNORMAL HIGH (ref 0.0–149.0)
VLDL: 32.4 mg/dL (ref 0.0–40.0)

## 2012-04-15 LAB — TESTOSTERONE: Testosterone: 391.46 ng/dL (ref 350.00–890.00)

## 2012-04-15 LAB — PSA: PSA: 1.97 ng/mL (ref 0.10–4.00)

## 2012-04-15 LAB — HEPATIC FUNCTION PANEL
ALT: 54 U/L — ABNORMAL HIGH (ref 0–53)
AST: 37 U/L (ref 0–37)
Albumin: 3.9 g/dL (ref 3.5–5.2)
Alkaline Phosphatase: 62 U/L (ref 39–117)
Bilirubin, Direct: 0.2 mg/dL (ref 0.0–0.3)
Total Bilirubin: 0.8 mg/dL (ref 0.3–1.2)
Total Protein: 6.5 g/dL (ref 6.0–8.3)

## 2012-04-15 LAB — MICROALBUMIN / CREATININE URINE RATIO
Creatinine,U: 77.6 mg/dL
Microalb Creat Ratio: 0.8 mg/g (ref 0.0–30.0)
Microalb, Ur: 0.6 mg/dL (ref 0.0–1.9)

## 2012-04-15 LAB — URIC ACID: Uric Acid, Serum: 5.7 mg/dL (ref 4.0–7.8)

## 2012-04-15 LAB — TSH: TSH: 1.37 u[IU]/mL (ref 0.35–5.50)

## 2012-04-15 LAB — HEMOGLOBIN A1C: Hgb A1c MFr Bld: 5.7 % (ref 4.6–6.5)

## 2012-04-15 MED ORDER — OXYCODONE-ACETAMINOPHEN 10-325 MG PO TABS: 1.0000 | ORAL_TABLET | Freq: Four times a day (QID) | ORAL | Status: DC | PRN

## 2012-04-15 NOTE — Telephone Encounter (Signed)
Called pt to inform of lab results, pt informed (letter also mailed to pt). 

## 2012-04-15 NOTE — Patient Instructions (Addendum)
Refer to a gastroenterology specialist.  you will receive a phone call, about a day and time for an appointment, for this and colonoscopy.   please consider these measures for your health:  minimize alcohol.  do not use tobacco products.  have a colonoscopy at least every 10 years from age 71.  keep firearms safely stored.  always use seat belts.  have working smoke alarms in your home.  see an eye doctor and dentist regularly.  never drive under the influence of alcohol or drugs (including prescription drugs).  those with fair skin should take precautions against the sun. please let me know what your wishes would be, if artificial life support measures should become necessary.  it is critically important to prevent falling down (keep floor areas well-lit, dry, and free of loose objects.  If you have a cane, walker, or wheelchair, you should use it, even for short trips around the house.  Also, try not to rush).   blood tests are being requested for you today.  You will receive a letter with results.   Please return in 1 year. (update: we discussed code status.  pt requests full code, but would not want to be started or maintained on artificial life-support measures if there was not a reasonable chance of recovery)

## 2012-04-15 NOTE — Progress Notes (Signed)
Subjective:    Patient ID: Larry Robertson, male    DOB: 1941/02/19, 71 y.o.   MRN: [id number]  HPI Pt states 6 mos of intermittent moderate dysphagia in the upper chest, in the context of eating solids.  No assoc n/v. Past Medical History  Diagnosis Date  . HEPATITIS C 08/25/2007  . DIABETES MELLITUS, TYPE II 08/25/2007  . HYPOGONADISM 09/27/2009  . 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  . 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 ERECTILE DYSFUNCTION 08/25/2007  . HYPERTENSION 08/25/2007  . 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 Diastolic dysfunction     on echo    Past Surgical History  Procedure Date  . Lumbar fusion 2005    History   Social History  . Marital Status: Single    Spouse Name: N/A    Number of Children: N/A  . 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 Smoking status: Never Smoker   . Smokeless tobacco: Not on file  . Alcohol Use: Not on file  . Drug Use: Not on file  . Sexually Active: Not on file   Other Topics Concern  . Not on file   Social History Narrative   Divorced    Current Outpatient Prescriptions on File Prior to Visit  Medication Sig Dispense Refill  . allopurinol (ZYLOPRIM) 300 MG tablet Take 300 mg by mouth daily.        Marland Kitchen amLODipine (NORVASC) 10 MG tablet TAKE 1 TABLET EVERY DAY  90 tablet  1  . aspirin 325 MG tablet Take 325 mg by mouth daily.        . colchicine 0.6 MG tablet 1 tablet every hour as needed for gout, not to exceed 6/day       . lisinopril (PRINIVIL,ZESTRIL) 40 MG tablet TAKE 1 TABLET EVERY DAY  90 tablet  1  . metFORMIN (GLUCOPHAGE) 1000 MG tablet TAKE 1 TABLET TWICE A DAY  60 tablet  3  . Multiple Vitamins-Minerals (CENTRUM SILVER ULTRA MENS PO) Take 1 tablet by mouth daily.        . Testosterone (ANDROGEL) 25 MG/2.5GM GEL 2.5 grams once daily (Dr. Aldean Ast)         Allergies  Allergen Reactions  . Pioglitazone     REACTION: edema     Family History  Problem Relation Age of Onset  . Cancer Neg Hx     BP 136/82  Pulse 68  Temp(Src) 97.2 F (36.2 C) (Oral)  Ht 6\' 2"  (1.88 m)  Wt 211 lb (95.709 kg)  BMI 27.09 kg/m2  SpO2 94%    Review of Systems Denies chest pain and sob    Objective:   Physical Exam VS: see vs page GEN: no distress HEAD: head: no deformity eyes: no periorbital swelling, no proptosis external nose and ears are normal mouth: no lesion seen NECK: supple, thyroid is not enlarged CHEST WALL: no deformity LUNGS: clear to auscultation BREASTS:  No gynecomastia CV: reg rate and rhythm, no murmur MUSCULOSKELETAL: muscle bulk and strength are grossly normal.  no obvious joint swelling.  gait is normal and steady EXTEMITIES: no deformity.  no ulcer on the feet.  feet are of normal color and temp.  no edema PULSES: dorsalis pedis intact bilat.  no carotid bruit NEURO:  cn 2-12 grossly intact.   readily moves all 4's.  sensation is intact to touch on the feet SKIN:  Normal texture and temperature.  No rash or suspicious lesion is visible.   NODES:  None palpable at the neck PSYCH: alert, oriented x3.  Does not appear anxious nor depressed.   Lab Results  Component Value Date   WBC 6.4 04/15/2012   HGB 15.2 04/15/2012   HCT 44.8 04/15/2012   PLT 196.0 04/15/2012   GLUCOSE 219* 04/15/2012   CHOL 143 04/15/2012   TRIG 162.0* 04/15/2012   HDL 46.90 04/15/2012   LDLDIRECT 90.6 09/27/2009   LDLCALC 64 04/15/2012   ALT 54* 04/15/2012   AST 37 04/15/2012   NA 137 04/15/2012   K 3.9 04/15/2012   CL 103 04/15/2012   CREATININE 0.7 04/15/2012   BUN 12 04/15/2012   CO2 26 04/15/2012   TSH 1.37 04/15/2012   PSA 1.97 04/15/2012   HGBA1C 5.7 04/15/2012   MICROALBUR 0.6 04/15/2012      Assessment & Plan:  Dysphagia, new, uncertain etiology Chronic hepatitis-c, with slightly elev transaminases Dyslipidemia, well-controlled Hypogonadism, well-controlled    Subjective:   Patient here for Medicare annual  wellness visit and management of other chronic and acute problems.     Risk factors: advanced age    Roster of Physicians Providing Medical Care to Patient:  See "snapshot"   Activities of Daily Living: In your present state of health, do you have any difficulty performing the following activities?:  Preparing food and eating?: No  Bathing yourself: No  Getting dressed: No  Using the toilet:No  Moving around from place to place: No  In the past year have you fallen or had a near fall?: No    Home Safety: Has smoke detector and wears seat belts. No firearms. No excess sun exposure.  Diet and Exercise  Current exercise habits:  Pt says "somewhat" Dietary issues discussed: pt reports a healthy diet   Depression Screen  Q1: Over the past two weeks, have you felt down, depressed or hopeless? no  Q2: Over the past two weeks, have you felt little interest or pleasure in doing things? no   The following portions of the patient's history were reviewed and updated as appropriate: allergies, current medications, past family history, past medical history, past social history, past surgical history and problem list.  Past Medical History  Diagnosis Date  . HEPATITIS C 08/25/2007  . DIABETES MELLITUS, TYPE II 08/25/2007  . HYPOGONADISM 09/27/2009  . 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  . 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 ERECTILE DYSFUNCTION 08/25/2007  . HYPERTENSION 08/25/2007  . 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 Diastolic dysfunction     on echo    Past Surgical History  Procedure Date  . Lumbar fusion 2005    History   Social History  . Marital Status: Single    Spouse Name: N/A    Number of Children: N/A  . 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 Smoking status: Never Smoker   . Smokeless tobacco: Not on file  . Alcohol Use: Not on file  . Drug Use: Not on file  . Sexually Active: Not  on file   Other Topics Concern  . Not on file   Social History Narrative   Divorced    Current Outpatient Prescriptions on File Prior to Visit  Medication Sig Dispense Refill  . allopurinol (ZYLOPRIM) 300 MG tablet Take 300 mg by mouth daily.        Marland Kitchen amLODipine (NORVASC) 10 MG tablet TAKE 1  TABLET EVERY DAY  90 tablet  1  . aspirin 325 MG tablet Take 325 mg by mouth daily.        . colchicine 0.6 MG tablet 1 tablet every hour as needed for gout, not to exceed 6/day       . lisinopril (PRINIVIL,ZESTRIL) 40 MG tablet TAKE 1 TABLET EVERY DAY  90 tablet  1  . metFORMIN (GLUCOPHAGE) 1000 MG tablet TAKE 1 TABLET TWICE A DAY  60 tablet  3  . Multiple Vitamins-Minerals (CENTRUM SILVER ULTRA MENS PO) Take 1 tablet by mouth daily.        Marland Kitchen oxyCODONE-acetaminophen (PERCOCET) 10-325 MG per tablet Take 1 tablet by mouth every 6 (six) hours as needed. For pain  100 tablet  0  . Testosterone (ANDROGEL) 25 MG/2.5GM GEL 2.5 grams once daily (Dr. Aldean Ast)         Allergies  Allergen Reactions  . Pioglitazone     REACTION: edema    Family History  Problem Relation Age of Onset  . Cancer Neg Hx     BP 136/82  Pulse 68  Temp(Src) 97.2 F (36.2 C) (Oral)  Ht 6\' 2"  (1.88 m)  Wt 211 lb (95.709 kg)  BMI 27.09 kg/m2  SpO2 94%   Review of Systems  Denies hearing loss, and visual loss Objective:   Vision:  Sees opthalmologist Hearing: grossly normal Body mass index:  See vs page Msk: pt easily and quickly performs "get-up-and-go" from a sitting position Cognitive Impairment Assessment: cognition, memory and judgment appear normal.  remembers 3/3 at 5 minutes.  excellent recall.  can easily read and write a sentence.  alert and oriented x 3.   Assessment:   Medicare wellness utd on preventive parameters    Plan:   During the course of the visit the patient was educated and counseled about appropriate screening and preventive services including:        Fall prevention   Diabetes  screening  Nutrition counseling   Vaccines / LABS Zostavax / Pnemonccoal Vaccine  today  PSA  Patient Instructions (the written plan) was given to the patient.

## 2012-05-05 ENCOUNTER — Encounter: Payer: Medicare Other | Admitting: Endocrinology

## 2012-05-10 ENCOUNTER — Ambulatory Visit (AMBULATORY_SURGERY_CENTER): Payer: Medicare Other | Admitting: *Deleted

## 2012-05-10 VITALS — Ht 74.0 in | Wt 213.0 lb

## 2012-05-10 DIAGNOSIS — Z1211 Encounter for screening for malignant neoplasm of colon: Secondary | ICD-10-CM

## 2012-05-10 MED ORDER — MOVIPREP 100 G PO SOLR: ORAL | Status: DC

## 2012-05-19 ENCOUNTER — Ambulatory Visit (AMBULATORY_SURGERY_CENTER): Payer: Medicare Other | Admitting: Gastroenterology

## 2012-05-19 ENCOUNTER — Encounter: Payer: Self-pay | Admitting: Gastroenterology

## 2012-05-19 VITALS — BP 151/98 | HR 66 | Temp 96.3°F | Resp 17 | Ht 74.0 in | Wt 213.0 lb

## 2012-05-19 DIAGNOSIS — D126 Benign neoplasm of colon, unspecified: Secondary | ICD-10-CM

## 2012-05-19 DIAGNOSIS — Z1211 Encounter for screening for malignant neoplasm of colon: Secondary | ICD-10-CM

## 2012-05-19 HISTORY — DX: Benign neoplasm of colon, unspecified: D12.6

## 2012-05-19 LAB — GLUCOSE, CAPILLARY
Glucose-Capillary: 135 mg/dL — ABNORMAL HIGH (ref 70–99)
Glucose-Capillary: 129 mg/dL — ABNORMAL HIGH (ref 70–99)

## 2012-05-19 MED ORDER — SODIUM CHLORIDE 0.9 % IV SOLN: 500.0000 mL | INTRAVENOUS | Status: DC

## 2012-05-19 NOTE — Op Note (Signed)
[facility] [address] [place], [place]  [address]  COLONOSCOPY PROCEDURE REPORT  PATIENT:  Larry Robertson, Larry Robertson  MR#:  [id number] BIRTHDATE:  03-17-1941, 71 yrs. old  GENDER:  male ENDOSCOPIST:  Barbette Hair. Arlyce Dice, MD REF. BY:  Sean A. Everardo All, M.D. PROCEDURE DATE:  05/19/2012 PROCEDURE:  Colonoscopy with snare polypectomy ASA CLASS:  Class II INDICATIONS:  Routine Risk Screening MEDICATIONS:   MAC sedation, administered by CRNA propofol 250mg IV  DESCRIPTION OF PROCEDURE:   After the risks benefits and alternatives of the procedure were thoroughly explained, informed consent was obtained.  Digital rectal exam was performed and revealed no abnormalities.   The LB [id number] endoscope was introduced through the anus and advanced to the cecum, which was identified by both the appendix and ileocecal valve, without limitations.  The quality of the prep was excellent, using MoviPrep.  The instrument was then slowly withdrawn as the colon was fully examined. <<PROCEDUREIMAGES>>  FINDINGS:  A pedunculated polyp was found in the sigmoid colon. It was 10 mm in size. It was found 30 cm from the point of entry. Polyp was snared, then cauterized with monopolar cautery. Retrieval was successful (see image1). snare polyp  Mild diverticulosis was found in the sigmoid colon (see image2).  This was otherwise a normal examination of the colon (see image6 and image10).   Retroflexed views in the rectum revealed no abnormalities.    The time to cecum =  1) 2.75  minutes. The scope was then withdrawn in  1) 9.0  minutes from the cecum and the procedure completed. COMPLICATIONS:  None ENDOSCOPIC IMPRESSION: 1) 10 mm pedunculated polyp in the sigmoid colon 2) Mild diverticulosis in the sigmoid colon 3) Otherwise normal examination RECOMMENDATIONS: 1) If the polyp(s) removed today are proven to be adenomatous (pre-cancerous) polyps, you will need a repeat colonoscopy in 5 years.  Otherwise you should continue to follow colorectal cancer screening guidelines for "routine risk" patients with colonoscopy in 10 years. You will receive a letter within 1-2 weeks with the results of your biopsy as well as final recommendations. Please call my office if you have not received a letter after 3 weeks. REPEAT EXAM:  You will receive a letter from Dr. Arlyce Dice in 1-2 weeks, after reviewing the final pathology, with followup recommendations.  ______________________________ Barbette Hair Arlyce Dice, MD  CC:  n. eSIGNED:   Barbette Hair. Latrail Pounders at 05/19/2012 10:12 AM  Delilah Shan, [id number]

## 2012-05-19 NOTE — Patient Instructions (Addendum)
YOU HAD AN ENDOSCOPIC PROCEDURE TODAY AT THE [facility]: Refer to the procedure report that was given to you for any specific questions about what was found during the examination.  If the procedure report does not answer your questions, please call your gastroenterologist to clarify.  If you requested that your care partner not be given the details of your procedure findings, then the procedure report has been included in a sealed envelope for you to review at your convenience later.  YOU SHOULD EXPECT: Some feelings of bloating in the abdomen. Passage of more gas than usual.  Walking can help get rid of the air that was put into your GI tract during the procedure and reduce the bloating. If you had a lower endoscopy (such as a colonoscopy or flexible sigmoidoscopy) you may notice spotting of blood in your stool or on the toilet paper. If you underwent a bowel prep for your procedure, then you may not have a normal bowel movement for a few days.  DIET: Your first meal following the procedure should be a light meal and then it is ok to progress to your normal diet.  A half-sandwich or bowl of soup is an example of a good first meal.  Heavy or fried foods are harder to digest and may make you feel nauseous or bloated.  Likewise meals heavy in dairy and vegetables can cause extra gas to form and this can also increase the bloating.  Drink plenty of fluids but you should avoid alcoholic beverages for 24 hours.  ACTIVITY: Your care partner should take you home directly after the procedure.  You should plan to take it easy, moving slowly for the rest of the day.  You can resume normal activity the day after the procedure however you should NOT DRIVE or use heavy machinery for 24 hours (because of the sedation medicines used during the test).    SYMPTOMS TO REPORT IMMEDIATELY: A gastroenterologist can be reached at any hour.  During normal business hours, 8:30 AM to 5:00 PM Monday through Friday,  call [phone].  After hours and on weekends, please call the GI answering service at [phone] who will take a message and have the physician on call contact you.   Following lower endoscopy (colonoscopy or flexible sigmoidoscopy):  Excessive amounts of blood in the stool  Significant tenderness or worsening of abdominal pains  Swelling of the abdomen that is new, acute  Fever of 100F or higher    FOLLOW UP: If any biopsies were taken you will be contacted by phone or by letter within the next 1-3 weeks.  Call your gastroenterologist if you have not heard about the biopsies in 3 weeks.  Our staff will call the home number listed on your records the next business day following your procedure to check on you and address any questions or concerns that you may have at that time regarding the information given to you following your procedure. This is a courtesy call and so if there is no answer at the home number and we have not heard from you through the emergency physician on call, we will assume that you have returned to your regular daily activities without incident.  SIGNATURES/CONFIDENTIALITY: You and/or your care partner have signed paperwork which will be entered into your electronic medical record.  These signatures attest to the fact that that the information above on your After Visit Summary has been reviewed and is understood.  Full responsibility of the confidentiality   of this discharge information lies with you and/or your care-partner.   Information on polyps & diverticulosis & high fiber diet given to you today  

## 2012-05-19 NOTE — Progress Notes (Signed)
Patient did not experience any of the following events: a burn prior to discharge; a fall within the facility; wrong site/side/patient/procedure/implant event; or a hospital transfer or hospital admission upon discharge from the facility. (G8907) Patient did not have preoperative order for IV antibiotic SSI prophylaxis. (G8918)  

## 2012-05-19 NOTE — Progress Notes (Signed)
Vital signs for procedure not recorded from monitor..please see anesthesia record.

## 2012-05-20 ENCOUNTER — Telehealth: Payer: Self-pay | Admitting: *Deleted

## 2012-05-20 NOTE — Telephone Encounter (Signed)
  Follow up Call-  Call back number 05/19/2012  Post procedure Call Back phone  # [phone] cell  Permission to leave phone message Yes     Patient questions:  Do you have a fever, pain , or abdominal swelling? no Pain Score  0 *  Have you tolerated food without any problems? yes  Have you been able to return to your normal activities? yes  Do you have any questions about your discharge instructions: Diet   no Medications  no Follow up visit  no  Do you have questions or concerns about your Care? no  Actions: * If pain score is 4 or above: No action needed, pain <4.

## 2012-05-25 ENCOUNTER — Encounter: Payer: Self-pay | Admitting: Gastroenterology

## 2012-05-27 ENCOUNTER — Other Ambulatory Visit: Payer: Self-pay

## 2012-05-27 MED ORDER — OXYCODONE-ACETAMINOPHEN 10-325 MG PO TABS: 1.0000 | ORAL_TABLET | Freq: Four times a day (QID) | ORAL | Status: DC | PRN

## 2012-05-27 NOTE — Telephone Encounter (Signed)
Pt informed, Rx in cabinet for pt pick up  

## 2012-07-01 ENCOUNTER — Other Ambulatory Visit: Payer: Self-pay | Admitting: *Deleted

## 2012-07-01 NOTE — Telephone Encounter (Signed)
R'cd fax from [facility] for refill of Percocet-last written 05/27/2012 #100 with 0 refills-please advise.

## 2012-07-02 MED ORDER — OXYCODONE-ACETAMINOPHEN 10-325 MG PO TABS: 1.0000 | ORAL_TABLET | Freq: Four times a day (QID) | ORAL | Status: DC | PRN

## 2012-07-02 NOTE — Telephone Encounter (Signed)
Pt informed rx ready for pickup, rx placed upfront in cabinet. 

## 2012-07-02 NOTE — Telephone Encounter (Signed)
i printed 

## 2012-08-05 ENCOUNTER — Other Ambulatory Visit: Payer: Self-pay | Admitting: *Deleted

## 2012-08-05 MED ORDER — OXYCODONE-ACETAMINOPHEN 10-325 MG PO TABS: 1.0000 | ORAL_TABLET | Freq: Four times a day (QID) | ORAL | Status: DC | PRN

## 2012-08-05 NOTE — Telephone Encounter (Signed)
Pt informed rx ready for pickup, placed upfront in cabinet.  

## 2012-08-05 NOTE — Telephone Encounter (Signed)
i printed 

## 2012-08-05 NOTE — Telephone Encounter (Signed)
R'cd call from pt requesting refill of Percocet-last written 07/02/2012 #100 with 0 refills-please advise.

## 2012-09-02 ENCOUNTER — Telehealth: Payer: Self-pay | Admitting: General Practice

## 2012-09-02 ENCOUNTER — Other Ambulatory Visit: Payer: Self-pay | Admitting: Endocrinology

## 2012-09-02 NOTE — Telephone Encounter (Signed)
Pt called stating he needs refill of oxyCODONE-acetaminophen (PERCOCET) 10-325 MG per tablet. Pt last seen on 04/15/12 and last refilled on 9/5. Please advise. Thanks.

## 2012-09-02 NOTE — Telephone Encounter (Signed)
Pt needs refill of percocet. Must come to office to pick up. Please call when complete. New address given.

## 2012-09-03 ENCOUNTER — Other Ambulatory Visit: Payer: Self-pay | Admitting: Endocrinology

## 2012-09-03 MED ORDER — OXYCODONE-ACETAMINOPHEN 10-325 MG PO TABS: 1.0000 | ORAL_TABLET | Freq: Four times a day (QID) | ORAL | Status: DC | PRN

## 2012-09-03 NOTE — Telephone Encounter (Signed)
i printed 

## 2012-09-30 ENCOUNTER — Telehealth: Payer: Self-pay | Admitting: *Deleted

## 2012-09-30 NOTE — Telephone Encounter (Signed)
Pt called requesting medication refill for Percocet 10-325 mg. Please advise.

## 2012-10-01 MED ORDER — OXYCODONE-ACETAMINOPHEN 10-325 MG PO TABS: 1.0000 | ORAL_TABLET | Freq: Four times a day (QID) | ORAL | Status: DC | PRN

## 2012-10-01 NOTE — Telephone Encounter (Signed)
i printed 

## 2012-11-02 ENCOUNTER — Telehealth: Payer: Self-pay | Admitting: Endocrinology

## 2012-11-02 MED ORDER — OXYCODONE-ACETAMINOPHEN 10-325 MG PO TABS: 1.0000 | ORAL_TABLET | Freq: Four times a day (QID) | ORAL | Status: DC | PRN

## 2012-11-02 NOTE — Telephone Encounter (Signed)
i printed 

## 2012-11-02 NOTE — Telephone Encounter (Signed)
Pt. advised rx ready for pick up 

## 2012-11-29 ENCOUNTER — Telehealth: Payer: Self-pay | Admitting: Endocrinology

## 2012-11-29 DIAGNOSIS — M5410 Radiculopathy, site unspecified: Secondary | ICD-10-CM

## 2012-11-29 MED ORDER — OXYCODONE-ACETAMINOPHEN 10-325 MG PO TABS: 1.0000 | ORAL_TABLET | Freq: Four times a day (QID) | ORAL | Status: DC | PRN

## 2012-11-29 NOTE — Telephone Encounter (Signed)
The patient called to request pain medication. The patient requests 10/325 Percocet.  Please call the patient at [phone].

## 2012-11-29 NOTE — Telephone Encounter (Signed)
Printed

## 2012-12-07 ENCOUNTER — Other Ambulatory Visit: Payer: Self-pay | Admitting: *Deleted

## 2012-12-07 MED ORDER — AMLODIPINE BESYLATE 10 MG PO TABS: 10.0000 mg | ORAL_TABLET | Freq: Every day | ORAL | Status: DC

## 2012-12-07 MED ORDER — LISINOPRIL 40 MG PO TABS: 40.0000 mg | ORAL_TABLET | Freq: Every day | ORAL | Status: DC

## 2012-12-28 ENCOUNTER — Telehealth: Payer: Self-pay | Admitting: Endocrinology

## 2012-12-28 DIAGNOSIS — M5410 Radiculopathy, site unspecified: Secondary | ICD-10-CM

## 2012-12-28 MED ORDER — OXYCODONE-ACETAMINOPHEN 10-325 MG PO TABS: 1.0000 | ORAL_TABLET | Freq: Four times a day (QID) | ORAL | Status: DC | PRN

## 2012-12-28 NOTE — Telephone Encounter (Signed)
i printed 

## 2012-12-28 NOTE — Telephone Encounter (Signed)
Pt. advised rx ready for pick up 

## 2013-01-26 ENCOUNTER — Telehealth: Payer: Self-pay

## 2013-01-26 DIAGNOSIS — M5410 Radiculopathy, site unspecified: Secondary | ICD-10-CM

## 2013-01-26 MED ORDER — OXYCODONE-ACETAMINOPHEN 10-325 MG PO TABS: 1.0000 | ORAL_TABLET | Freq: Four times a day (QID) | ORAL | Status: DC | PRN

## 2013-01-26 NOTE — Telephone Encounter (Signed)
Pt called requesting a rx for percocet [phone]

## 2013-01-26 NOTE — Telephone Encounter (Signed)
i printed 

## 2013-01-28 ENCOUNTER — Telehealth: Payer: Self-pay | Admitting: *Deleted

## 2013-01-28 NOTE — Telephone Encounter (Signed)
PATIENT NOTIFIED OF RX READY TO PICK UP FOR PERCOSET AT FRONT DESK.

## 2013-02-25 ENCOUNTER — Telehealth: Payer: Self-pay

## 2013-02-25 DIAGNOSIS — M5410 Radiculopathy, site unspecified: Secondary | ICD-10-CM

## 2013-02-25 MED ORDER — OXYCODONE-ACETAMINOPHEN 10-325 MG PO TABS: 1.0000 | ORAL_TABLET | Freq: Four times a day (QID) | ORAL | Status: DC | PRN

## 2013-02-25 NOTE — Addendum Note (Signed)
Addended by: Romero Belling on: 02/25/2013 11:21 AM   Modules accepted: Orders

## 2013-02-25 NOTE — Telephone Encounter (Signed)
Pt called requesting rx for percocet [phone]

## 2013-02-25 NOTE — Telephone Encounter (Signed)
i printed 

## 2013-03-30 ENCOUNTER — Telehealth: Payer: Self-pay | Admitting: Endocrinology

## 2013-03-30 ENCOUNTER — Other Ambulatory Visit: Payer: Self-pay | Admitting: Endocrinology

## 2013-03-30 DIAGNOSIS — M5410 Radiculopathy, site unspecified: Secondary | ICD-10-CM

## 2013-03-30 MED ORDER — OXYCODONE-ACETAMINOPHEN 10-325 MG PO TABS: 1.0000 | ORAL_TABLET | Freq: Four times a day (QID) | ORAL | Status: DC | PRN

## 2013-03-30 NOTE — Telephone Encounter (Signed)
i printed "medicare wellness" visit is due on or after 04/15/13.

## 2013-04-20 ENCOUNTER — Ambulatory Visit (INDEPENDENT_AMBULATORY_CARE_PROVIDER_SITE_OTHER): Payer: PRIVATE HEALTH INSURANCE | Admitting: Endocrinology

## 2013-04-20 VITALS — BP 132/70 | HR 76 | Ht 74.0 in | Wt 216.0 lb

## 2013-04-20 DIAGNOSIS — B1710 Acute hepatitis C without hepatic coma: Secondary | ICD-10-CM

## 2013-04-20 DIAGNOSIS — R1310 Dysphagia, unspecified: Secondary | ICD-10-CM

## 2013-04-20 DIAGNOSIS — Z125 Encounter for screening for malignant neoplasm of prostate: Secondary | ICD-10-CM

## 2013-04-20 DIAGNOSIS — IMO0002 Reserved for concepts with insufficient information to code with codable children: Secondary | ICD-10-CM

## 2013-04-20 DIAGNOSIS — R9431 Abnormal electrocardiogram [ECG] [EKG]: Secondary | ICD-10-CM

## 2013-04-20 DIAGNOSIS — I10 Essential (primary) hypertension: Secondary | ICD-10-CM

## 2013-04-20 DIAGNOSIS — Z79899 Other long term (current) drug therapy: Secondary | ICD-10-CM

## 2013-04-20 DIAGNOSIS — E119 Type 2 diabetes mellitus without complications: Secondary | ICD-10-CM

## 2013-04-20 DIAGNOSIS — E291 Testicular hypofunction: Secondary | ICD-10-CM

## 2013-04-20 DIAGNOSIS — Z0000 Encounter for general adult medical examination without abnormal findings: Secondary | ICD-10-CM

## 2013-04-20 DIAGNOSIS — M109 Gout, unspecified: Secondary | ICD-10-CM

## 2013-04-20 DIAGNOSIS — E781 Pure hyperglyceridemia: Secondary | ICD-10-CM

## 2013-04-20 DIAGNOSIS — M5410 Radiculopathy, site unspecified: Secondary | ICD-10-CM

## 2013-04-20 LAB — CBC WITH DIFFERENTIAL/PLATELET
Basophils Absolute: 0.1 10*3/uL (ref 0.0–0.1)
Basophils Relative: 0.8 % (ref 0.0–3.0)
Eosinophils Absolute: 0.1 10*3/uL (ref 0.0–0.7)
Eosinophils Relative: 2.1 % (ref 0.0–5.0)
HCT: 44.6 % (ref 39.0–52.0)
Hemoglobin: 15.6 g/dL (ref 13.0–17.0)
Lymphocytes Relative: 35.6 % (ref 12.0–46.0)
Lymphs Abs: 2.4 10*3/uL (ref 0.7–4.0)
MCHC: 34.9 g/dL (ref 30.0–36.0)
MCV: 90.1 fl (ref 78.0–100.0)
Monocytes Absolute: 0.5 10*3/uL (ref 0.1–1.0)
Monocytes Relative: 7.6 % (ref 3.0–12.0)
Neutro Abs: 3.6 10*3/uL (ref 1.4–7.7)
Neutrophils Relative %: 53.9 % (ref 43.0–77.0)
Platelets: 219 10*3/uL (ref 150.0–400.0)
RBC: 4.95 Mil/uL (ref 4.22–5.81)
RDW: 13.1 % (ref 11.5–14.6)
WBC: 6.7 10*3/uL (ref 4.5–10.5)

## 2013-04-20 LAB — URINALYSIS, ROUTINE W REFLEX MICROSCOPIC
Bilirubin Urine: NEGATIVE
Hgb urine dipstick: NEGATIVE
Ketones, ur: NEGATIVE
Leukocytes, UA: NEGATIVE
Nitrite: NEGATIVE
RBC / HPF: NONE SEEN (ref 0–?)
Specific Gravity, Urine: 1.015 (ref 1.000–1.030)
Total Protein, Urine: NEGATIVE
Urine Glucose: NEGATIVE
Urobilinogen, UA: 0.2 (ref 0.0–1.0)
WBC, UA: NONE SEEN (ref 0–?)
pH: 6 (ref 5.0–8.0)

## 2013-04-20 LAB — MICROALBUMIN / CREATININE URINE RATIO
Creatinine,U: 43.1 mg/dL
Microalb Creat Ratio: 3 mg/g (ref 0.0–30.0)
Microalb, Ur: 1.3 mg/dL (ref 0.0–1.9)

## 2013-04-20 LAB — BASIC METABOLIC PANEL
BUN: 10 mg/dL (ref 6–23)
CO2: 29 mEq/L (ref 19–32)
Calcium: 9.4 mg/dL (ref 8.4–10.5)
Chloride: 103 mEq/L (ref 96–112)
Creatinine, Ser: 0.9 mg/dL (ref 0.4–1.5)
GFR: 89.04 mL/min (ref >60.00)
Glucose, Bld: 127 mg/dL — ABNORMAL HIGH (ref 70–99)
Potassium: 4.1 mEq/L (ref 3.5–5.1)
Sodium: 139 mEq/L (ref 135–145)

## 2013-04-20 LAB — HEPATIC FUNCTION PANEL
ALT: 41 U/L (ref 0–53)
AST: 29 U/L (ref 0–37)
Albumin: 4 g/dL (ref 3.5–5.2)
Alkaline Phosphatase: 49 U/L (ref 39–117)
Bilirubin, Direct: 0.2 mg/dL (ref 0.0–0.3)
Total Bilirubin: 0.7 mg/dL (ref 0.3–1.2)
Total Protein: 7 g/dL (ref 6.0–8.3)

## 2013-04-20 LAB — HEMOGLOBIN A1C: Hgb A1c MFr Bld: 5.9 % (ref 4.6–6.5)

## 2013-04-20 LAB — PSA: PSA: 2.32 ng/mL (ref 0.10–4.00)

## 2013-04-20 LAB — LIPID PANEL
Cholesterol: 136 mg/dL (ref 0–200)
HDL: 38.6 mg/dL — ABNORMAL LOW (ref >39.00)
LDL Cholesterol: 63 mg/dL (ref 0–99)
Total CHOL/HDL Ratio: 4
Triglycerides: 174 mg/dL — ABNORMAL HIGH (ref 0.0–149.0)
VLDL: 34.8 mg/dL (ref 0.0–40.0)

## 2013-04-20 LAB — TSH: TSH: 1.24 u[IU]/mL (ref 0.35–5.50)

## 2013-04-20 LAB — TESTOSTERONE: Testosterone: 381.39 ng/dL (ref 350.00–890.00)

## 2013-04-20 LAB — URIC ACID: Uric Acid, Serum: 7.3 mg/dL (ref 4.0–7.8)

## 2013-04-20 MED ORDER — OXYCODONE-ACETAMINOPHEN 10-325 MG PO TABS: 1.0000 | ORAL_TABLET | Freq: Four times a day (QID) | ORAL | Status: DC | PRN

## 2013-04-20 NOTE — Progress Notes (Signed)
Subjective:    Patient ID: Larry Robertson, male    DOB: [birth year], 72 y.o.   MRN: [id number]  HPI Pt is here for regular wellness examination, and is feeling pretty well in general, and says chronic med probs are stable, except as noted below Past Medical History  Diagnosis Date  . HEPATITIS C 08/25/2007  . DIABETES MELLITUS, TYPE II 08/25/2007  . HYPOGONADISM 09/27/2009  . 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  . 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 ERECTILE DYSFUNCTION 08/25/2007  . HYPERTENSION 08/25/2007  . Diastolic dysfunction     on echo  . 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 Arthritis     Past Surgical History  Procedure Laterality Date  . Lumbar fusion  2005  . Tonsillectomy and adenoidectomy    . Cholecystectomy    . Abdominal hernia repair      History   Social History  . Marital Status: Single    Spouse Name: N/A    Number of Children: N/A  . 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 Smoking status: Never Smoker   . Smokeless tobacco: Never Used  . Alcohol Use: Yes     Comment: BEER OCCASIONAL  . Drug Use: No  . Sexually Active: Not on file   Other Topics Concern  . Not on file   Social History Narrative   Divorced    Current Outpatient Prescriptions on File Prior to Visit  Medication Sig Dispense Refill  . allopurinol (ZYLOPRIM) 300 MG tablet Take 300 mg by mouth daily.        Marland Kitchen amLODipine (NORVASC) 10 MG tablet Take 1 tablet (10 mg total) by mouth daily.  90 tablet  2  . aspirin 325 MG tablet Take 325 mg by mouth daily.        . colchicine 0.6 MG tablet 1 tablet every hour as needed for gout, not to exceed 6/day       . lisinopril (PRINIVIL,ZESTRIL) 40 MG tablet Take 1 tablet (40 mg total) by mouth daily.  90 tablet  2  . metFORMIN (GLUCOPHAGE) 1000 MG tablet TAKE 1 TABLET TWICE A DAY  60 tablet  3  . Multiple Vitamins-Minerals (CENTRUM SILVER ULTRA MENS PO) Take 1  tablet by mouth daily.         No current facility-administered medications on file prior to visit.    Allergies  Allergen Reactions  . Pioglitazone Other (See Comments)    ACTOS=REACTION: edema    Family History  Problem Relation Age of Onset  . Cancer Neg Hx   . Colon cancer Neg Hx   . Esophageal cancer Neg Hx   . Rectal cancer Neg Hx   . Stomach cancer Neg Hx     BP 132/70  Pulse 76  Ht 6\' 2"  (1.88 m)  Wt 216 lb (97.977 kg)  BMI 27.72 kg/m2  SpO2 97%     Review of Systems  Constitutional: Negative for fever.  HENT: Negative for hearing loss.   Eyes: Negative for visual disturbance.  Respiratory: Negative for shortness of breath.   Cardiovascular: Negative for chest pain.  Gastrointestinal: Negative for diarrhea.  Endocrine: Negative for cold intolerance.  Genitourinary: Negative for hematuria.  Musculoskeletal:       Chronic arthralgias persist  Skin: Negative for rash.  Allergic/Immunologic: Negative for environmental allergies.  Neurological: Negative for syncope and numbness.  Hematological: Does not bruise/bleed easily.  Psychiatric/Behavioral: Negative  for dysphoric mood.       Objective:   Physical Exam VS: see vs page GEN: no distress HEAD: head: no deformity eyes: no periorbital swelling, no proptosis external nose and ears are normal mouth: no lesion seen NECK: supple, thyroid is not enlarged CHEST WALL: no deformity LUNGS: clear to auscultation, except for decreased breath sounds at the left base BREASTS:  No gynecomastia CV: reg rate and rhythm, no murmur.    RECTAL: normal external and internal exam.  heme neg. PROSTATE:  Normal size.  No nodule MUSCULOSKELETAL: muscle bulk and strength are grossly normal.  no obvious joint swelling.  gait is normal and steady.   PULSES: no carotid bruit NEURO:  cn 2-12 grossly intact.   readily moves all 4's.  SKIN:  Normal texture and temperature.  No rash or suspicious lesion is visible.   NODES:   None palpable at the neck PSYCH: alert, oriented x3.  Does not appear anxious nor depressed.     Assessment & Plan:  Wellness visit today, with problems stable, except as noted. we discussed code status.  pt requests full code, but would not want to be started or maintained on artificial life-support measures if there was not a reasonable chance of recovery.     SEPARATE EVALUATION FOLLOWS--EACH PROBLEM HERE IS NEW, NOT RESPONDING TO TREATMENT, OR POSES SIGNIFICANT RISK TO THE PATIENT'S HEALTH: HISTORY OF THE PRESENT ILLNESS: Pt reports few mos of intermittent moderate solid-only quality dysphagia, worst at the anterior chest, and assoc pain.  PAST MEDICAL HISTORY reviewed and up to date today REVIEW OF SYSTEMS: Denies weight loss and brbpr PHYSICAL EXAMINATION: VITAL SIGNS:  See vs page ABDOMEN: abdomen is soft, nontender.  no hepatosplenomegaly.  not distended.  no hernia GENERAL: no distress LAB/XRAY RESULTS: i reviewed electrocardiogram IMPRESSION: Dysphagia, new Abnormal ecg, new HTN, well-controlled PLAN: See instruction page Pt will continue same acei for now, pending echo result

## 2013-04-20 NOTE — Patient Instructions (Addendum)
please consider these measures for your health:  minimize alcohol.  do not use tobacco products.  have a colonoscopy at least every 10 years from age 74.  keep firearms safely stored.  always use seat belts.  have working smoke alarms in your home.  see an eye doctor and dentist regularly.  never drive under the influence of alcohol or drugs (including prescription drugs).  those with fair skin should take precautions against the sun. it is critically important to prevent falling down (keep floor areas well-lit, dry, and free of loose objects.  If you have a cane, walker, or wheelchair, you should use it, even for short trips around the house.  Also, try not to rush).   blood tests are being requested for you today.  We'll contact you with results.   Let's check an "echocardiogram."  you will receive a phone call, about a day and time for an appointment.   Refer to a gastroenterology specialist.  you will receive a phone call, about a day and time for an appointment.   You should have a vaccine against shingles (a painful rash which results from the  chickenpox infection which most people had many years ago).  This vaccine reduces, but does not totally eliminate the risk of shingles.  Because this is a medicare part d benefit, you should get it at a pharmacy.

## 2013-05-05 ENCOUNTER — Ambulatory Visit (HOSPITAL_COMMUNITY): Payer: PRIVATE HEALTH INSURANCE | Attending: Internal Medicine

## 2013-05-05 ENCOUNTER — Other Ambulatory Visit (HOSPITAL_COMMUNITY): Payer: Self-pay | Admitting: Endocrinology

## 2013-05-05 DIAGNOSIS — E785 Hyperlipidemia, unspecified: Secondary | ICD-10-CM | POA: Insufficient documentation

## 2013-05-05 DIAGNOSIS — R9431 Abnormal electrocardiogram [ECG] [EKG]: Secondary | ICD-10-CM

## 2013-05-05 DIAGNOSIS — E119 Type 2 diabetes mellitus without complications: Secondary | ICD-10-CM | POA: Insufficient documentation

## 2013-05-05 DIAGNOSIS — I10 Essential (primary) hypertension: Secondary | ICD-10-CM | POA: Insufficient documentation

## 2013-05-05 NOTE — Progress Notes (Signed)
Echocardiogram performed.  

## 2013-05-18 ENCOUNTER — Emergency Department (HOSPITAL_COMMUNITY)
Admission: EM | Admit: 2013-05-18 | Discharge: 2013-05-18 | Disposition: A | Discharge status: Home / Self Care | Payer: PRIVATE HEALTH INSURANCE | Attending: Emergency Medicine | Admitting: Emergency Medicine

## 2013-05-18 ENCOUNTER — Encounter (HOSPITAL_COMMUNITY): Payer: Self-pay | Admitting: Emergency Medicine

## 2013-05-18 ENCOUNTER — Emergency Department (HOSPITAL_COMMUNITY): Payer: PRIVATE HEALTH INSURANCE

## 2013-05-18 DIAGNOSIS — Z862 Personal history of diseases of the blood and blood-forming organs and certain disorders involving the immune mechanism: Secondary | ICD-10-CM | POA: Insufficient documentation

## 2013-05-18 DIAGNOSIS — I10 Essential (primary) hypertension: Secondary | ICD-10-CM | POA: Insufficient documentation

## 2013-05-18 DIAGNOSIS — F1010 Alcohol abuse, uncomplicated: Secondary | ICD-10-CM | POA: Insufficient documentation

## 2013-05-18 DIAGNOSIS — Z8739 Personal history of other diseases of the musculoskeletal system and connective tissue: Secondary | ICD-10-CM | POA: Insufficient documentation

## 2013-05-18 DIAGNOSIS — M25539 Pain in unspecified wrist: Secondary | ICD-10-CM | POA: Insufficient documentation

## 2013-05-18 DIAGNOSIS — Z791 Long term (current) use of non-steroidal anti-inflammatories (NSAID): Secondary | ICD-10-CM | POA: Insufficient documentation

## 2013-05-18 DIAGNOSIS — Z8639 Personal history of other endocrine, nutritional and metabolic disease: Secondary | ICD-10-CM | POA: Insufficient documentation

## 2013-05-18 DIAGNOSIS — Q791 Other congenital malformations of diaphragm: Secondary | ICD-10-CM | POA: Insufficient documentation

## 2013-05-18 DIAGNOSIS — E781 Pure hyperglyceridemia: Secondary | ICD-10-CM | POA: Insufficient documentation

## 2013-05-18 DIAGNOSIS — Z8619 Personal history of other infectious and parasitic diseases: Secondary | ICD-10-CM | POA: Insufficient documentation

## 2013-05-18 DIAGNOSIS — Z7982 Long term (current) use of aspirin: Secondary | ICD-10-CM | POA: Insufficient documentation

## 2013-05-18 DIAGNOSIS — Z79899 Other long term (current) drug therapy: Secondary | ICD-10-CM | POA: Insufficient documentation

## 2013-05-18 DIAGNOSIS — E119 Type 2 diabetes mellitus without complications: Secondary | ICD-10-CM | POA: Insufficient documentation

## 2013-05-18 DIAGNOSIS — M25531 Pain in right wrist: Secondary | ICD-10-CM

## 2013-05-18 DIAGNOSIS — Z87448 Personal history of other diseases of urinary system: Secondary | ICD-10-CM | POA: Insufficient documentation

## 2013-05-18 DIAGNOSIS — Z8679 Personal history of other diseases of the circulatory system: Secondary | ICD-10-CM | POA: Insufficient documentation

## 2013-05-18 MED ORDER — INDOMETHACIN 25 MG PO CAPS: 25.0000 mg | ORAL_CAPSULE | Freq: Three times a day (TID) | ORAL | Status: DC | PRN

## 2013-05-18 MED ORDER — HYDROCODONE-ACETAMINOPHEN 5-325 MG PO TABS: 1.0000 | ORAL_TABLET | Freq: Four times a day (QID) | ORAL | Status: DC | PRN

## 2013-05-18 MED ORDER — TRAMADOL HCL 50 MG PO TABS
50.0000 mg | ORAL_TABLET | Freq: Once | ORAL | Status: AC
  Administered 2013-05-18: 50 mg via ORAL
  Filled 2013-05-18: qty 1

## 2013-05-18 MED ORDER — PREDNISONE 20 MG PO TABS: 40.0000 mg | ORAL_TABLET | Freq: Every day | ORAL | Status: DC

## 2013-05-18 NOTE — ED Provider Notes (Signed)
History     CSN: [id number]  Arrival date & time 05/18/13  1213   First MD Initiated Contact with Patient 05/18/13 1340      Chief Complaint  Patient presents with  . Arm Pain    (Consider location/radiation/quality/duration/timing/severity/associated sxs/prior treatment) HPI Comments: R wrist pain x 2 days. Sharp and nonradiating. Constant without alleviating factors; aggravated by movement and palpation. Patient denies new/recent wrist trauma. 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 Has taken allopurinol and colchicine for gout in past.  Patient is a 72 y.o. male presenting with arm pain. The history is provided by the patient. No language interpreter was used.  Arm Pain This is a new problem. The current episode started yesterday. The problem occurs constantly. The problem has been unchanged. Associated symptoms include arthralgias. Pertinent negatives include no chills, diaphoresis, fatigue, fever, joint swelling, numbness or weakness. Exacerbated by: movement and palpation. Treatments tried: Aleve. The treatment provided no relief.    Past Medical History  Diagnosis Date  . HEPATITIS C 08/25/2007  . DIABETES MELLITUS, TYPE II 08/25/2007  . HYPOGONADISM 09/27/2009  . 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  . 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 ERECTILE DYSFUNCTION 08/25/2007  . HYPERTENSION 08/25/2007  . Diastolic dysfunction     on echo  . 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 Arthritis     Past Surgical History  Procedure Laterality Date  . Lumbar fusion  2005  . Tonsillectomy and adenoidectomy    . Cholecystectomy    . Abdominal hernia repair      Family History  Problem Relation Age of Onset  . Cancer Neg Hx   . Colon cancer Neg Hx   . Esophageal cancer Neg Hx   . Rectal cancer Neg Hx   . Stomach cancer Neg Hx     History  Substance Use Topics  . Smoking status: Never Smoker   . Smokeless tobacco: Never Used  . Alcohol Use: Yes     Comment:  BEER OCCASIONAL     Review of Systems  Constitutional: Negative for fever, chills, diaphoresis and fatigue.  Musculoskeletal: Positive for arthralgias. Negative for joint swelling.  Skin: Negative for color change and pallor.  Neurological: Negative for weakness and numbness.  All other systems reviewed and are negative.    Allergies  Pioglitazone  Home Medications   Current Outpatient Rx  Name  Route  Sig  Dispense  Refill  . allopurinol (ZYLOPRIM) 300 MG tablet   Oral   Take 300 mg by mouth daily.           Marland Kitchen amLODipine (NORVASC) 10 MG tablet   Oral   Take 1 tablet (10 mg total) by mouth daily.   90 tablet   2   . aspirin 325 MG tablet   Oral   Take 325 mg by mouth daily.           . colchicine 0.6 MG tablet   Oral   Take 0.6 mg by mouth. 1 tablet every hour as needed for gout, not to exceed 6/day         . metFORMIN (GLUCOPHAGE) 1000 MG tablet   Oral   Take 1,000 mg by mouth 2 (two) times daily with a meal.         . oxyCODONE-acetaminophen (PERCOCET) 10-325 MG per tablet   Oral   Take 1 tablet by mouth every 6 (six) hours as needed. For pain   100 tablet   0   . HYDROcodone-acetaminophen (  NORCO/VICODIN) 5-325 MG per tablet   Oral   Take 1 tablet by mouth every 6 (six) hours as needed for pain.   10 tablet   0   . indomethacin (INDOCIN) 25 MG capsule   Oral   Take 1 capsule (25 mg total) by mouth 3 (three) times daily as needed.   30 capsule   0   . predniSONE (DELTASONE) 20 MG tablet   Oral   Take 2 tablets (40 mg total) by mouth daily.   10 tablet   0     BP 162/81  Pulse 78  Temp(Src) 98.1 F (36.7 C) (Oral)  Resp 16  SpO2 95%  Physical Exam  Nursing note and vitals reviewed. Constitutional: He is oriented to person, place, and time. He appears well-developed and well-nourished. No distress.  HENT:  Head: Normocephalic and atraumatic.  Eyes: Conjunctivae and EOM are normal. No scleral icterus.  Neck: Normal range of  motion.  Cardiovascular: Normal rate, regular rhythm and intact distal pulses.   Distal radial pulse 2+ bilaterally. Capillary refill normal.  Pulmonary/Chest: Effort normal. No respiratory distress.  Musculoskeletal:       Right wrist: He exhibits tenderness and bony tenderness. He exhibits normal range of motion, no swelling, no effusion, no crepitus, no deformity and no laceration.       Right forearm: Normal.       Right hand: Normal.  No decreased ROM of wrist joint and strength against resistance of wrist joint 5/5.   Neurological: He is alert and oriented to person, place, and time.  Skin: Skin is warm and dry. No rash noted. He is not diaphoretic. No erythema. No pallor.  Psychiatric: He has a normal mood and affect. His behavior is normal.    ED Course  Procedures (including critical care time)  Labs Reviewed - No data to display Dg Wrist Complete Right  05/18/2013   *RADIOLOGY REPORT*  Clinical Data: Wrist pain injury  RIGHT WRIST - COMPLETE 3+ VIEW  Comparison: None.  Findings: Mild degenerative changes are noted at the first carpometacarpal articulation. Degenerative changes are noted in the carpal row as well as in the radial ulnar articulation.  No acute fracture or dislocation is seen.  IMPRESSION: Degenerative change without acute abnormality.   Original Report Authenticated By: Alcide Clever, M.D.     1. Wrist pain, acute, right      MDM  Patient presents for right wrist pain with onset yesterday evening. Patient neurovascularly intact on physical exam with no decreased range of motion. There is tenderness to palpation over the ulnar styloid without redness, swelling, or heat-to-touch to suspect joint infection. X-ray significant for degenerative changes without evidence of fracture or dislocation. Patient afebrile and hemodynamically stable. Wrist splint applied in ED. Appropriate for discharge with NSAIDs, prednisone, given hx of gouty attacks, and Norco to take as needed  for severe pain. Ortho referral given for followup and indications for ED return provided. Patient states comfort and understanding with plan with no unaddressed concerns.        Antony Madura, PA-C 05/18/13 1600

## 2013-05-18 NOTE — ED Notes (Signed)
Pt. Stated, It started yesterday evening my rt. Arm started hurting, no injury

## 2013-05-18 NOTE — ED Notes (Signed)
Pt to xray

## 2013-05-18 NOTE — ED Notes (Signed)
PT comfortable with d/c and f/u intructions. Prescriptions x3

## 2013-05-19 NOTE — ED Provider Notes (Signed)
Medical screening examination/treatment/procedure(s) were performed by non-physician practitioner and as supervising physician I was immediately available for consultation/collaboration.   Carleene Cooper III, MD 05/19/13 1006

## 2013-05-26 ENCOUNTER — Telehealth: Payer: Self-pay | Admitting: Endocrinology

## 2013-05-26 DIAGNOSIS — M5410 Radiculopathy, site unspecified: Secondary | ICD-10-CM

## 2013-05-26 NOTE — Telephone Encounter (Signed)
Larry Robertson wants his Pain meds refilled  Phone is [phone]

## 2013-05-26 NOTE — Telephone Encounter (Signed)
Pt would like Percocet refilled?

## 2013-05-27 MED ORDER — OXYCODONE-ACETAMINOPHEN 10-325 MG PO TABS: 1.0000 | ORAL_TABLET | Freq: Four times a day (QID) | ORAL | Status: DC | PRN

## 2013-05-27 NOTE — Telephone Encounter (Signed)
i printed 

## 2013-05-27 NOTE — Telephone Encounter (Signed)
Pt advised.

## 2013-05-31 ENCOUNTER — Other Ambulatory Visit: Payer: Self-pay | Admitting: *Deleted

## 2013-05-31 MED ORDER — METFORMIN HCL 1000 MG PO TABS: 1000.0000 mg | ORAL_TABLET | Freq: Two times a day (BID) | ORAL | Status: DC

## 2013-06-15 ENCOUNTER — Encounter: Payer: Self-pay | Admitting: Gastroenterology

## 2013-06-15 ENCOUNTER — Telehealth: Payer: Self-pay | Admitting: Gastroenterology

## 2013-06-15 NOTE — Telephone Encounter (Signed)
error 

## 2013-06-27 ENCOUNTER — Other Ambulatory Visit: Payer: Self-pay | Admitting: *Deleted

## 2013-06-27 MED ORDER — METFORMIN HCL 1000 MG PO TABS: 1000.0000 mg | ORAL_TABLET | Freq: Two times a day (BID) | ORAL | Status: DC

## 2013-06-28 ENCOUNTER — Telehealth: Payer: Self-pay | Admitting: *Deleted

## 2013-06-28 DIAGNOSIS — M5410 Radiculopathy, site unspecified: Secondary | ICD-10-CM

## 2013-06-28 MED ORDER — OXYCODONE-ACETAMINOPHEN 10-325 MG PO TABS: 1.0000 | ORAL_TABLET | Freq: Four times a day (QID) | ORAL | Status: DC | PRN

## 2013-06-28 NOTE — Telephone Encounter (Signed)
Larry Robertson, please enter and I will sign.

## 2013-06-28 NOTE — Telephone Encounter (Signed)
Pt called requesting a refill on rx of oxyCODONE-acetaminophen (PERCOCET) 10-325 MG per tablet . Please advise if ok to refill. Thank you.

## 2013-06-28 NOTE — Telephone Encounter (Signed)
Called pt and let him know he could pick up his rx at the office.

## 2013-07-13 ENCOUNTER — Other Ambulatory Visit (INDEPENDENT_AMBULATORY_CARE_PROVIDER_SITE_OTHER): Payer: PRIVATE HEALTH INSURANCE

## 2013-07-13 ENCOUNTER — Encounter: Payer: Self-pay | Admitting: Gastroenterology

## 2013-07-13 ENCOUNTER — Ambulatory Visit (INDEPENDENT_AMBULATORY_CARE_PROVIDER_SITE_OTHER): Payer: PRIVATE HEALTH INSURANCE | Admitting: Gastroenterology

## 2013-07-13 VITALS — BP 126/80 | HR 68 | Ht 71.5 in | Wt 215.4 lb

## 2013-07-13 DIAGNOSIS — B182 Chronic viral hepatitis C: Secondary | ICD-10-CM

## 2013-07-13 DIAGNOSIS — Z860100 Personal history of colon polyps, unspecified: Secondary | ICD-10-CM | POA: Insufficient documentation

## 2013-07-13 DIAGNOSIS — B1920 Unspecified viral hepatitis C without hepatic coma: Secondary | ICD-10-CM

## 2013-07-13 DIAGNOSIS — B1710 Acute hepatitis C without hepatic coma: Secondary | ICD-10-CM

## 2013-07-13 DIAGNOSIS — R1310 Dysphagia, unspecified: Secondary | ICD-10-CM

## 2013-07-13 LAB — HEPATIC FUNCTION PANEL
ALT: 58 U/L — ABNORMAL HIGH (ref 0–53)
AST: 42 U/L — ABNORMAL HIGH (ref 0–37)
Albumin: 4.2 g/dL (ref 3.5–5.2)
Alkaline Phosphatase: 40 U/L (ref 39–117)
Bilirubin, Direct: 0.3 mg/dL (ref 0.0–0.3)
Total Bilirubin: 1.2 mg/dL (ref 0.3–1.2)
Total Protein: 7.3 g/dL (ref 6.0–8.3)

## 2013-07-13 LAB — PROTIME-INR
INR: 1 ratio (ref 0.8–1.0)
Prothrombin Time: 11 s (ref 10.2–12.4)

## 2013-07-13 NOTE — Assessment & Plan Note (Signed)
Rule out early esophageal stricture  Recommendations #1 upper endoscopy with dilatation as indicated 

## 2013-07-13 NOTE — Patient Instructions (Addendum)
You have been scheduled for an endoscopy with propofol. Please follow written instructions given to you at your visit today. If you use inhalers (even only as needed), please bring them with you on the day of your procedure. Your physician has requested that you go to www.startemmi.com and enter the access code given to you at your visit today. This web site gives a general overview about your procedure. However, you should still follow specific instructions given to you by our office regarding your preparation for the procedure.  You will go to the basement for labs today

## 2013-07-13 NOTE — Progress Notes (Signed)
History of Present Illness: Pleasant, 72 year old white male with history of hepatitis C, diabetes, colon polyp, referred for evaluation of dysphagia.  Over the past year he's noted increasing onset of dysphagia to solids.  This may be accompanied by odynophagia.  He denies pyrosis.  Weight has been stable.  He's been told to have hepatitis C, although he knows no details.  LFTs from May, 2014 were normal.    Past Medical History  Diagnosis Date  . HEPATITIS C 08/25/2007  . DIABETES MELLITUS, TYPE II 08/25/2007  . HYPOGONADISM 09/27/2009  . 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  . 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 ERECTILE DYSFUNCTION 08/25/2007  . HYPERTENSION 08/25/2007  . Diastolic dysfunction     on echo  . 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 Arthritis    Past Surgical History  Procedure Laterality Date  . Lumbar fusion  2005  . Tonsillectomy and adenoidectomy    . Cholecystectomy  1999  . Abdominal hernia repair  2000   family history includes Diabetes in his maternal grandmother. There is no history of Cancer, Colon cancer, Esophageal cancer, Rectal cancer, or Stomach cancer. Current Outpatient Prescriptions  Medication Sig Dispense Refill  . allopurinol (ZYLOPRIM) 300 MG tablet Take 300 mg by mouth daily.        Marland Kitchen amLODipine (NORVASC) 10 MG tablet Take 1 tablet (10 mg total) by mouth daily.  90 tablet  2  . aspirin 325 MG tablet Take 325 mg by mouth daily.        . colchicine 0.6 MG tablet Take 0.6 mg by mouth. 1 tablet every hour as needed for gout, not to exceed 6/day      . lisinopril (PRINIVIL,ZESTRIL) 40 MG tablet Take 40 mg by mouth daily.      . metFORMIN (GLUCOPHAGE) 1000 MG tablet Take 1 tablet (1,000 mg total) by mouth 2 (two) times daily with a meal.  180 tablet  1  . oxyCODONE-acetaminophen (PERCOCET) 10-325 MG per tablet Take 1 tablet by mouth every 6 (six) hours as needed. For pain  100 tablet  0  . predniSONE (DELTASONE) 20 MG tablet Take 2 tablets  (40 mg total) by mouth daily.  10 tablet  0   No current facility-administered medications for this visit.   Allergies as of 07/13/2013  . (No Known Allergies)    reports that he has never smoked. He has never used smokeless tobacco. He reports that  drinks alcohol. He reports that he does not use illicit drugs.     Review of Systems: Pertinent positive and negative review of systems were noted in the above HPI section. All other review of systems were otherwise negative.  Vital signs were reviewed in today's medical record Physical Exam: General: Well developed , well nourished, no acute distress Skin: anicteric Head: Normocephalic and atraumatic Eyes:  sclerae anicteric, EOMI Ears: Normal auditory acuity Mouth: No deformity or lesions Neck: Supple, no masses or thyromegaly Lungs: Clear throughout to auscultation Heart: Regular rate and rhythm; no murmurs, rubs or bruits Abdomen: Soft, non tender and non distended. No masses, hepatosplenomegaly or hernias noted. Normal Bowel sounds Rectal:deferred Musculoskeletal: Symmetrical with no gross deformities  Skin: No lesions on visible extremities Pulses:  Normal pulses noted Extremities: No clubbing, cyanosis, edema or deformities noted Neurological: Alert oriented x 4, grossly nonfocal Cervical Nodes:  No significant cervical adenopathy Inguinal Nodes: No significant inguinal adenopathy Psychological:  Alert and cooperative. Normal mood and affect

## 2013-07-13 NOTE — Assessment & Plan Note (Signed)
Plan to check hepatitis C quantitative RNA.  If high, I would refer him back to hepatology since there is new therapy for hepatitis C

## 2013-07-14 LAB — HEPATITIS C RNA QUANTITATIVE
HCV Quantitative Log: 6.27 {Log} — ABNORMAL HIGH (ref <1.18)
HCV Quantitative: 1855093 IU/mL — ABNORMAL HIGH (ref <15)

## 2013-07-27 ENCOUNTER — Telehealth: Payer: Self-pay | Admitting: Endocrinology

## 2013-07-27 ENCOUNTER — Other Ambulatory Visit: Payer: Self-pay | Admitting: Endocrinology

## 2013-07-27 DIAGNOSIS — M5410 Radiculopathy, site unspecified: Secondary | ICD-10-CM

## 2013-07-27 MED ORDER — OXYCODONE-ACETAMINOPHEN 10-325 MG PO TABS: 1.0000 | ORAL_TABLET | Freq: Four times a day (QID) | ORAL | Status: DC | PRN

## 2013-07-27 NOTE — Telephone Encounter (Signed)
rx faxed

## 2013-07-27 NOTE — Telephone Encounter (Signed)
i printed 

## 2013-07-28 ENCOUNTER — Telehealth: Payer: Self-pay | Admitting: Endocrinology

## 2013-07-28 NOTE — Telephone Encounter (Signed)
Pt called requesting refill for Percocet. Please advise.

## 2013-07-28 NOTE — Telephone Encounter (Signed)
i printed yesterday

## 2013-07-28 NOTE — Telephone Encounter (Signed)
Called for refill yesterday for Percocet. Pt called again today to see if it is ready. Please call patient [phone] / Oneita Kras.

## 2013-07-29 ENCOUNTER — Telehealth: Payer: Self-pay | Admitting: Endocrinology

## 2013-07-29 NOTE — Telephone Encounter (Signed)
Pt advised to pick up rx. 

## 2013-07-29 NOTE — Telephone Encounter (Signed)
Pt advised tp p/u rx

## 2013-08-10 ENCOUNTER — Encounter: Payer: Self-pay | Admitting: Gastroenterology

## 2013-08-10 ENCOUNTER — Ambulatory Visit (AMBULATORY_SURGERY_CENTER): Payer: PRIVATE HEALTH INSURANCE | Admitting: Gastroenterology

## 2013-08-10 VITALS — BP 139/85 | HR 65 | Temp 97.8°F | Resp 19 | Ht 71.5 in | Wt 215.0 lb

## 2013-08-10 DIAGNOSIS — R1310 Dysphagia, unspecified: Secondary | ICD-10-CM

## 2013-08-10 DIAGNOSIS — K222 Esophageal obstruction: Secondary | ICD-10-CM

## 2013-08-10 LAB — GLUCOSE, CAPILLARY
Glucose-Capillary: 114 mg/dL — ABNORMAL HIGH (ref 70–99)
Glucose-Capillary: 115 mg/dL — ABNORMAL HIGH (ref 70–99)
Glucose-Capillary: 58 mg/dL — ABNORMAL LOW (ref 70–99)

## 2013-08-10 MED ORDER — OMEPRAZOLE 20 MG PO CPDR: 20.0000 mg | DELAYED_RELEASE_CAPSULE | Freq: Every day | ORAL | Status: DC

## 2013-08-10 MED ORDER — SODIUM CHLORIDE 0.9 % IV SOLN: 500.0000 mL | INTRAVENOUS | Status: DC

## 2013-08-10 NOTE — Patient Instructions (Addendum)
YOU HAD AN ENDOSCOPIC PROCEDURE TODAY AT THE [facility]: Refer to the procedure report that was given to you for any specific questions about what was found during the examination.  If the procedure report does not answer your questions, please call your gastroenterologist to clarify.  If you requested that your care partner not be given the details of your procedure findings, then the procedure report has been included in a sealed envelope for you to review at your convenience later.  YOU SHOULD EXPECT: Some feelings of bloating in the abdomen. Passage of more gas than usual.  Walking can help get rid of the air that was put into your GI tract during the procedure and reduce the bloating. If you had a lower endoscopy (such as a colonoscopy or flexible sigmoidoscopy) you may notice spotting of blood in your stool or on the toilet paper. If you underwent a bowel prep for your procedure, then you may not have a normal bowel movement for a few days.  DIET:    Drink plenty of fluids but you should avoid alcoholic beverages for 24 hours.  Please follow the dilatation diet the rest of the day.  ACTIVITY: Your care partner should take you home directly after the procedure.  You should plan to take it easy, moving slowly for the rest of the day.  You can resume normal activity the day after the procedure however you should NOT DRIVE or use heavy machinery for 24 hours (because of the sedation medicines used during the test).    SYMPTOMS TO REPORT IMMEDIATELY: A gastroenterologist can be reached at any hour.  During normal business hours, 8:30 AM to 5:00 PM Monday through Friday, call [phone].  After hours and on weekends, please call the GI answering service at [phone] who will take a message and have the physician on call contact you.   Following upper endoscopy (EGD)  Vomiting of blood or coffee ground material  New chest pain or pain under the shoulder blades  Painful or  persistently difficult swallowing  New shortness of breath  Fever of 100F or higher  Black, tarry-looking stools  FOLLOW UP: If any biopsies were taken you will be contacted by phone or by letter within the next 1-3 weeks.  Call your gastroenterologist if you have not heard about the biopsies in 3 weeks.  Our staff will call the home number listed on your records the next business day following your procedure to check on you and address any questions or concerns that you may have at that time regarding the information given to you following your procedure. This is a courtesy call and so if there is no answer at the home number and we have not heard from you through the emergency physician on call, we will assume that you have returned to your regular daily activities without incident.  SIGNATURES/CONFIDENTIALITY: You and/or your care partner have signed paperwork which will be entered into your electronic medical record.  These signatures attest to the fact that that the information above on your After Visit Summary has been reviewed and is understood.  Full responsibility of the confidentiality of this discharge information lies with you and/or your care-partner.    Esophageal dilatation diet given to pt. Get Omeprazole 20 mg daily take 20 - 30 minutes before breakfast.  You may buy this over the counter at your pharmacy. You may resume your other current medications today also. Office visit with Dr. Arlyce Dice in 6 weeks.  If Dr. Marzetta Board nurse has not call you with an appoint in a couple of days, please call and set oone up.

## 2013-08-10 NOTE — Progress Notes (Signed)
No complaints noted in the recovery room. Maw   

## 2013-08-10 NOTE — Op Note (Signed)
[facility] [address] [place], [address]   ENDOSCOPY PROCEDURE REPORT  PATIENT: Larry Robertson, Larry Robertson  MR#: [id number] BIRTHDATE: 09/22/1941 , 72  yrs. old GENDER: Male ENDOSCOPIST: Louis Meckel, MD REFERRED BY:  Minus Breeding, M.D. PROCEDURE DATE:  08/10/2013 PROCEDURE:  EGD, diagnostic and Maloney dilation of esophagus ASA CLASS:     Class II INDICATIONS:  Dysphagia. MEDICATIONS: MAC sedation, administered by CRNA and propofol (Diprivan) 200mg  IV TOPICAL ANESTHETIC: Cetacaine Spray  DESCRIPTION OF PROCEDURE: After the risks benefits and alternatives of the procedure were thoroughly explained, informed consent was obtained.  The LB [id number] endoscope was introduced through the mouth and advanced to the third portion of the duodenum. Without limitations.  The instrument was slowly withdrawn as the mucosa was fully examined.      At the GE junction there was a moderate stricture.  The 9.8 mm gastroscope easily traversed the stricture.  A small slight halo hernia was present.   The remainder of the upper endoscopy exam was otherwise normal.  Retroflexed views revealed no abnormalities. The scope was then withdrawn from the patient and the procedure completed.  Following withdrawal of the scope a #52 [place] Maloney dilator was passed with mild resistance.  There was no heme  COMPLICATIONS: There were no complications. ENDOSCOPIC IMPRESSION: esophageal stricture-status post Elease Hashimoto dilation RECOMMENDATIONS: 1.  office visit for 6 weeks 2.  omeprazole 20 mg daily REPEAT EXAM:  eSigned:  Louis Meckel, MD 08/10/2013 3:22 PM   CC:

## 2013-08-10 NOTE — Progress Notes (Signed)
Called to room to assist during endoscopic procedure.  Patient ID and intended procedure confirmed with present staff. Received instructions for my participation in the procedure from the performing physician.  

## 2013-08-11 ENCOUNTER — Telehealth: Payer: Self-pay | Admitting: Internal Medicine

## 2013-08-11 ENCOUNTER — Encounter: Payer: Self-pay | Admitting: *Deleted

## 2013-08-11 ENCOUNTER — Telehealth: Payer: Self-pay | Admitting: *Deleted

## 2013-08-11 NOTE — Telephone Encounter (Signed)
The patient feels slightly dizzy. Had EGD and Maloney dilation 08/10/13. No chest pain, dysphagia, abdominal pain or fever.  He will monitor and call back [place] or PCP prn

## 2013-08-11 NOTE — Telephone Encounter (Signed)
  Follow up Call-  Call back number 08/10/2013 05/19/2012  Post procedure Call Back phone  # [phone] cell  Permission to leave phone message Yes Yes     Patient questions:  Do you have a fever, pain , or abdominal swelling? no Pain Score  0 *  Have you tolerated food without any problems? yes  Have you been able to return to your normal activities? yes  Do you have any questions about your discharge instructions: Diet   no Medications  no Follow up visit  no  Do you have questions or concerns about your Care? no  Actions: * If pain score is 4 or above: No action needed, pain <4.

## 2013-08-26 ENCOUNTER — Telehealth: Payer: Self-pay | Admitting: Endocrinology

## 2013-08-26 DIAGNOSIS — M5410 Radiculopathy, site unspecified: Secondary | ICD-10-CM

## 2013-08-26 MED ORDER — OXYCODONE-ACETAMINOPHEN 10-325 MG PO TABS: 1.0000 | ORAL_TABLET | Freq: Four times a day (QID) | ORAL | Status: DC | PRN

## 2013-08-26 NOTE — Telephone Encounter (Signed)
Pt called requesting a refill of his percocet. Please advise.

## 2013-08-26 NOTE — Telephone Encounter (Signed)
Left message pt to p/u rx 

## 2013-08-26 NOTE — Telephone Encounter (Signed)
i printed 

## 2013-09-01 ENCOUNTER — Ambulatory Visit (INDEPENDENT_AMBULATORY_CARE_PROVIDER_SITE_OTHER): Payer: PRIVATE HEALTH INSURANCE | Admitting: Gastroenterology

## 2013-09-01 ENCOUNTER — Encounter: Payer: Self-pay | Admitting: Gastroenterology

## 2013-09-01 VITALS — BP 110/67 | HR 67 | Ht 71.5 in | Wt 217.6 lb

## 2013-09-01 DIAGNOSIS — R1310 Dysphagia, unspecified: Secondary | ICD-10-CM

## 2013-09-01 DIAGNOSIS — B1710 Acute hepatitis C without hepatic coma: Secondary | ICD-10-CM

## 2013-09-01 DIAGNOSIS — Z86010 Personal history of colonic polyps: Secondary | ICD-10-CM

## 2013-09-01 DIAGNOSIS — R9431 Abnormal electrocardiogram [ECG] [EKG]: Secondary | ICD-10-CM

## 2013-09-01 DIAGNOSIS — B182 Chronic viral hepatitis C: Secondary | ICD-10-CM

## 2013-09-01 NOTE — Assessment & Plan Note (Addendum)
Patient scheduled for visit with hepatology in view of the new medicines that are available for hepatitis C

## 2013-09-01 NOTE — Patient Instructions (Addendum)
Follow up as needed

## 2013-09-01 NOTE — Progress Notes (Signed)
History of Present Illness:  The patient has returned following esophageal dilatation of a distal stricture.  Dysphagia has resolved.  He has no GI complaints.  He is scheduled for a visit with hepatology for hepatitis C.    Review of Systems: Pertinent positive and negative review of systems were noted in the above HPI section. All other review of systems were otherwise negative.    Current Medications, Allergies, Past Medical History, Past Surgical History, Family History and Social History were reviewed in [employer] electronic medical record  Vital signs were reviewed in today's medical record. Physical Exam: General: Well developed , well nourished, no acute distress

## 2013-09-01 NOTE — Assessment & Plan Note (Signed)
Resolved following dilatation therapy 

## 2013-09-02 ENCOUNTER — Other Ambulatory Visit: Payer: Self-pay | Admitting: Internal Medicine

## 2013-09-02 DIAGNOSIS — B1920 Unspecified viral hepatitis C without hepatic coma: Secondary | ICD-10-CM

## 2013-09-07 ENCOUNTER — Other Ambulatory Visit: Payer: PRIVATE HEALTH INSURANCE

## 2013-09-13 ENCOUNTER — Ambulatory Visit
Admission: RE | Admit: 2013-09-13 | Discharge: 2013-09-13 | Disposition: A | Discharge status: Home / Self Care | Payer: PRIVATE HEALTH INSURANCE | Source: Ambulatory Visit | Attending: Internal Medicine | Admitting: Internal Medicine

## 2013-09-13 DIAGNOSIS — B1920 Unspecified viral hepatitis C without hepatic coma: Secondary | ICD-10-CM

## 2013-09-19 ENCOUNTER — Other Ambulatory Visit: Payer: Self-pay

## 2013-09-19 MED ORDER — AMLODIPINE BESYLATE 10 MG PO TABS: 10.0000 mg | ORAL_TABLET | Freq: Every day | ORAL | Status: DC

## 2013-09-19 MED ORDER — LISINOPRIL 40 MG PO TABS: 40.0000 mg | ORAL_TABLET | Freq: Every day | ORAL | Status: DC

## 2013-09-19 NOTE — Telephone Encounter (Signed)
Faxed refill for lisinopril to [facility] pharmacy...ds,cma

## 2013-09-19 NOTE — Telephone Encounter (Signed)
Refilled amlodipine to [facility] pharmacy...ds,cma

## 2013-09-27 ENCOUNTER — Other Ambulatory Visit: Payer: Self-pay | Admitting: Endocrinology

## 2013-09-27 ENCOUNTER — Telehealth: Payer: Self-pay | Admitting: Endocrinology

## 2013-09-27 DIAGNOSIS — M5410 Radiculopathy, site unspecified: Secondary | ICD-10-CM

## 2013-09-27 MED ORDER — OXYCODONE-ACETAMINOPHEN 10-325 MG PO TABS: 1.0000 | ORAL_TABLET | Freq: Four times a day (QID) | ORAL | Status: DC | PRN

## 2013-09-27 NOTE — Telephone Encounter (Signed)
Called to make pt aware rx ready for pick up.   

## 2013-09-27 NOTE — Telephone Encounter (Signed)
Pt last seen 5.21.14. Pls advise.

## 2013-09-27 NOTE — Telephone Encounter (Signed)
i printed 

## 2013-10-11 LAB — HM DIABETES EYE EXAM

## 2013-11-01 ENCOUNTER — Telehealth: Payer: Self-pay

## 2013-11-01 DIAGNOSIS — M5410 Radiculopathy, site unspecified: Secondary | ICD-10-CM

## 2013-11-01 MED ORDER — OXYCODONE-ACETAMINOPHEN 10-325 MG PO TABS: 1.0000 | ORAL_TABLET | Freq: Four times a day (QID) | ORAL | Status: DC | PRN

## 2013-11-01 NOTE — Telephone Encounter (Signed)
i printed 

## 2013-11-01 NOTE — Telephone Encounter (Signed)
Patient called requesting refill on Percocet. Patient was last seen on 5.21.2014 and medication was last filled on 09/27/2013.   Thanks!

## 2013-11-03 ENCOUNTER — Encounter: Payer: Self-pay | Admitting: Endocrinology

## 2013-11-28 ENCOUNTER — Telehealth: Payer: Self-pay

## 2013-11-28 DIAGNOSIS — M5410 Radiculopathy, site unspecified: Secondary | ICD-10-CM

## 2013-11-28 MED ORDER — OXYCODONE-ACETAMINOPHEN 10-325 MG PO TABS: 1.0000 | ORAL_TABLET | Freq: Four times a day (QID) | ORAL | Status: DC | PRN

## 2013-11-28 NOTE — Telephone Encounter (Signed)
Pt called requesting a refill on Percocet. Pt was lass seen on 04/20/2013 and medication was last filled on 11/01/2013.   Please advise during Dr. George Hugh absence,  Thanks!

## 2013-11-28 NOTE — Telephone Encounter (Signed)
OK to refill starting 12/02/2013.

## 2013-11-28 NOTE — Telephone Encounter (Signed)
Called pt informing that script is ready for pick up.

## 2013-11-29 ENCOUNTER — Other Ambulatory Visit: Payer: Self-pay | Admitting: *Deleted

## 2013-11-29 DIAGNOSIS — M5410 Radiculopathy, site unspecified: Secondary | ICD-10-CM

## 2013-11-29 MED ORDER — OXYCODONE-ACETAMINOPHEN 10-325 MG PO TABS: 1.0000 | ORAL_TABLET | Freq: Four times a day (QID) | ORAL | Status: DC | PRN

## 2013-12-12 ENCOUNTER — Telehealth: Payer: Self-pay

## 2013-12-12 MED ORDER — LISINOPRIL 40 MG PO TABS: 40.0000 mg | ORAL_TABLET | Freq: Every day | ORAL | Status: DC

## 2013-12-12 NOTE — Telephone Encounter (Signed)
Refill for Lisinopril.

## 2013-12-28 ENCOUNTER — Telehealth: Payer: Self-pay

## 2013-12-28 DIAGNOSIS — M5410 Radiculopathy, site unspecified: Secondary | ICD-10-CM

## 2013-12-28 MED ORDER — OXYCODONE-ACETAMINOPHEN 10-325 MG PO TABS: 1.0000 | ORAL_TABLET | Freq: Four times a day (QID) | ORAL | Status: DC | PRN

## 2013-12-28 NOTE — Telephone Encounter (Signed)
Pt informed that script has been approved. Script placed upfront.

## 2013-12-28 NOTE — Telephone Encounter (Signed)
i printed 

## 2013-12-28 NOTE — Telephone Encounter (Signed)
Pt called requesting a refill on his Oxycodone. Pt was last seen on 04/20/2013 and medication was last filled on 11/29/2013. Thanks!

## 2014-01-31 ENCOUNTER — Telehealth: Payer: Self-pay | Admitting: Endocrinology

## 2014-01-31 DIAGNOSIS — M5410 Radiculopathy, site unspecified: Secondary | ICD-10-CM

## 2014-01-31 MED ORDER — OXYCODONE-ACETAMINOPHEN 10-325 MG PO TABS: 1.0000 | ORAL_TABLET | Freq: Four times a day (QID) | ORAL | Status: DC | PRN

## 2014-01-31 NOTE — Telephone Encounter (Signed)
Pt informed that script is ready for pick up. Script placed upfront.  

## 2014-01-31 NOTE — Telephone Encounter (Signed)
i printed Ov is due 

## 2014-01-31 NOTE — Telephone Encounter (Signed)
Pt would like pain meds called in  Pharmacy on file   Call Back: [phone]  Thank You :)

## 2014-01-31 NOTE — Telephone Encounter (Signed)
Pt is requesting refill for Oxycodone. Med last filled on 12/28/2013 and last seen 04/20/2013.  Thanks!

## 2014-02-06 ENCOUNTER — Ambulatory Visit (INDEPENDENT_AMBULATORY_CARE_PROVIDER_SITE_OTHER): Payer: PRIVATE HEALTH INSURANCE | Admitting: Endocrinology

## 2014-02-06 ENCOUNTER — Encounter: Payer: Self-pay | Admitting: Endocrinology

## 2014-02-06 VITALS — BP 142/98 | HR 73 | Temp 97.7°F | Ht 71.5 in | Wt 217.0 lb

## 2014-02-06 DIAGNOSIS — M545 Low back pain, unspecified: Secondary | ICD-10-CM

## 2014-02-06 DIAGNOSIS — E119 Type 2 diabetes mellitus without complications: Secondary | ICD-10-CM

## 2014-02-06 MED ORDER — LISINOPRIL 40 MG PO TABS: 40.0000 mg | ORAL_TABLET | Freq: Every day | ORAL | Status: DC

## 2014-02-06 MED ORDER — FUROSEMIDE 20 MG PO TABS: 20.0000 mg | ORAL_TABLET | Freq: Every day | ORAL | Status: DC

## 2014-02-06 NOTE — Progress Notes (Signed)
Subjective:    Patient ID: Larry Robertson, male    DOB: 07-30-1941, 73 y.o.   MRN: [id number]  HPI The state of at least three ongoing medical problems is addressed today, with interval history of each noted here: Pt returns for f/u of type 2 DM (dx'ed 2010; he has never been on insulin; pt states he feels well in general).   Low-back pain: percocet helps.  HTN: pt says BP is also high at home.   Past Medical History  Diagnosis Date  . HEPATITIS C 08/25/2007  . DIABETES MELLITUS, TYPE II 08/25/2007  . HYPOGONADISM 09/27/2009  . 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  . 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 ERECTILE DYSFUNCTION 08/25/2007  . HYPERTENSION 08/25/2007  . Diastolic dysfunction     on echo  . 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 Arthritis     Past Surgical History  Procedure Laterality Date  . Lumbar fusion  2005  . Tonsillectomy and adenoidectomy    . Cholecystectomy  1999  . Abdominal hernia repair  2000    History   Social History  . Marital Status: Single    Spouse Name: N/A    Number of Children: 7  . Years of Education: N/A   Occupational History  . 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 Smoking status: Never Smoker   . Smokeless tobacco: Never Used  . Alcohol Use: Yes     Comment: 3-4 beers per week  . Drug Use: No  . Sexual Activity: Not on file   Other Topics Concern  . Not on file   Social History Narrative   Divorced    Current Outpatient Prescriptions on File Prior to Visit  Medication Sig Dispense Refill  . allopurinol (ZYLOPRIM) 300 MG tablet Take 300 mg by mouth daily.        Marland Kitchen amLODipine (NORVASC) 10 MG tablet Take 1 tablet (10 mg total) by mouth daily.  90 tablet  2  . aspirin 325 MG tablet Take 325 mg by mouth daily.        . colchicine 0.6 MG tablet Take 0.6 mg by mouth. 1 tablet every hour as needed for gout, not to exceed 6/day      . metFORMIN (GLUCOPHAGE) 1000 MG tablet Take 1 tablet  (1,000 mg total) by mouth 2 (two) times daily with a meal.  180 tablet  1  . omeprazole (PRILOSEC) 20 MG capsule Take 1 capsule (20 mg total) by mouth daily.  30 capsule  3  . oxyCODONE-acetaminophen (PERCOCET) 10-325 MG per tablet Take 1 tablet by mouth every 6 (six) hours as needed. For pain  100 tablet  0   No current facility-administered medications on file prior to visit.    No Known Allergies  Family History  Problem Relation Age of Onset  . Cancer Neg Hx   . Colon cancer Neg Hx   . Esophageal cancer Neg Hx   . Rectal cancer Neg Hx   . Stomach cancer Neg Hx   . Diabetes Maternal Grandmother     BP 142/98  Pulse 73  Temp(Src) 97.7 F (36.5 C) (Oral)  Ht 5' 11.5" (1.816 m)  Wt 217 lb (98.431 kg)  BMI 29.85 kg/m2  SpO2 95%  Review of Systems Denies weight change and foot pain    Objective:   Physical Exam VITAL SIGNS:  See vs page GENERAL: no distress  Lab Results  Component Value Date  HGBA1C 6.4 02/06/2014       Assessment & Plan:  DM: well-controlled Low-back pain: well-controlled. HTN: he needs increased rx.

## 2014-02-06 NOTE — Patient Instructions (Addendum)
blood tests are being requested for you today.  We'll contact you with results.  i have sent a prescription to your pharmacy, to add a fluid pill.  Please come back for a regular physical appointment in 3 months.

## 2014-02-07 LAB — HEMOGLOBIN A1C: Hgb A1c MFr Bld: 6.4 % (ref 4.6–6.5)

## 2014-03-06 ENCOUNTER — Other Ambulatory Visit: Payer: Self-pay | Admitting: Endocrinology

## 2014-03-06 ENCOUNTER — Telehealth: Payer: Self-pay | Admitting: Endocrinology

## 2014-03-06 DIAGNOSIS — M5410 Radiculopathy, site unspecified: Secondary | ICD-10-CM

## 2014-03-06 MED ORDER — OXYCODONE-ACETAMINOPHEN 10-325 MG PO TABS: 1.0000 | ORAL_TABLET | Freq: Four times a day (QID) | ORAL | Status: DC | PRN

## 2014-03-06 NOTE — Telephone Encounter (Signed)
OK to refill for 30 days, no refills.

## 2014-03-06 NOTE — Telephone Encounter (Signed)
Noted, pt informed that rx is ready for pick up. Script placed up front.

## 2014-03-06 NOTE — Telephone Encounter (Signed)
Pt would like a refill on his percocet   Call [phone]  Thank You

## 2014-03-06 NOTE — Telephone Encounter (Signed)
Pt would like a refill on Percocet. Pt was last seen on 02/06/2014 and medication was last filled on 01/31/2014. Please advise during Dr. Cordelia Pen absence. Thanks!

## 2014-04-03 ENCOUNTER — Telehealth: Payer: Self-pay | Admitting: Endocrinology

## 2014-04-03 DIAGNOSIS — M5410 Radiculopathy, site unspecified: Secondary | ICD-10-CM

## 2014-04-03 MED ORDER — OXYCODONE-ACETAMINOPHEN 10-325 MG PO TABS: 1.0000 | ORAL_TABLET | Freq: Four times a day (QID) | ORAL | Status: DC | PRN

## 2014-04-03 NOTE — Telephone Encounter (Signed)
i printed 

## 2014-04-03 NOTE — Telephone Encounter (Signed)
Pt is out of his percocet completely

## 2014-04-03 NOTE — Telephone Encounter (Signed)
Pt is requesting a refill on his Oxycodone Pt was last seen on 02/06/2014 and medication was last fill on 03/06/2014. Thanks!

## 2014-04-03 NOTE — Telephone Encounter (Signed)
Patient informed that Rx is ready for pick up. Script placed up front.

## 2014-04-04 ENCOUNTER — Other Ambulatory Visit: Payer: Self-pay

## 2014-04-04 MED ORDER — METFORMIN HCL 1000 MG PO TABS: 1000.0000 mg | ORAL_TABLET | Freq: Two times a day (BID) | ORAL | Status: DC

## 2014-05-04 ENCOUNTER — Telehealth: Payer: Self-pay | Admitting: Endocrinology

## 2014-05-04 DIAGNOSIS — M5410 Radiculopathy, site unspecified: Secondary | ICD-10-CM

## 2014-05-04 MED ORDER — OXYCODONE-ACETAMINOPHEN 10-325 MG PO TABS: 1.0000 | ORAL_TABLET | Freq: Four times a day (QID) | ORAL | Status: DC | PRN

## 2014-05-04 NOTE — Telephone Encounter (Signed)
See below pt was last seen on 02/06/2014 and med was last filled on 04/03/2014.  Thanks!

## 2014-05-04 NOTE — Telephone Encounter (Signed)
i printed 

## 2014-05-04 NOTE — Telephone Encounter (Signed)
Patient would like his percocet filled  Thank You :)

## 2014-05-05 NOTE — Telephone Encounter (Signed)
Pt advised that rx is ready for pick up. Script placed front.

## 2014-05-09 ENCOUNTER — Ambulatory Visit (INDEPENDENT_AMBULATORY_CARE_PROVIDER_SITE_OTHER): Payer: PRIVATE HEALTH INSURANCE | Admitting: Endocrinology

## 2014-05-09 ENCOUNTER — Encounter: Payer: Self-pay | Admitting: Endocrinology

## 2014-05-09 VITALS — BP 120/70 | HR 78 | Temp 98.3°F | Ht 71.5 in | Wt 205.0 lb

## 2014-05-09 DIAGNOSIS — R1310 Dysphagia, unspecified: Secondary | ICD-10-CM

## 2014-05-09 DIAGNOSIS — Z125 Encounter for screening for malignant neoplasm of prostate: Secondary | ICD-10-CM

## 2014-05-09 DIAGNOSIS — M109 Gout, unspecified: Secondary | ICD-10-CM

## 2014-05-09 DIAGNOSIS — Z79899 Other long term (current) drug therapy: Secondary | ICD-10-CM

## 2014-05-09 DIAGNOSIS — Z0000 Encounter for general adult medical examination without abnormal findings: Secondary | ICD-10-CM

## 2014-05-09 DIAGNOSIS — B1710 Acute hepatitis C without hepatic coma: Secondary | ICD-10-CM

## 2014-05-09 DIAGNOSIS — E781 Pure hyperglyceridemia: Secondary | ICD-10-CM

## 2014-05-09 DIAGNOSIS — E119 Type 2 diabetes mellitus without complications: Secondary | ICD-10-CM

## 2014-05-09 DIAGNOSIS — I10 Essential (primary) hypertension: Secondary | ICD-10-CM

## 2014-05-09 LAB — CBC WITH DIFFERENTIAL/PLATELET
Basophils Absolute: 0 10*3/uL (ref 0.0–0.1)
Basophils Relative: 0.5 % (ref 0.0–3.0)
Eosinophils Absolute: 0.2 10*3/uL (ref 0.0–0.7)
Eosinophils Relative: 3.1 % (ref 0.0–5.0)
HCT: 42.7 % (ref 39.0–52.0)
Hemoglobin: 14.6 g/dL (ref 13.0–17.0)
Lymphocytes Relative: 28.3 % (ref 12.0–46.0)
Lymphs Abs: 2 10*3/uL (ref 0.7–4.0)
MCHC: 34.1 g/dL (ref 30.0–36.0)
MCV: 93.8 fl (ref 78.0–100.0)
Monocytes Absolute: 0.5 10*3/uL (ref 0.1–1.0)
Monocytes Relative: 7.6 % (ref 3.0–12.0)
Neutro Abs: 4.2 10*3/uL (ref 1.4–7.7)
Neutrophils Relative %: 60.5 % (ref 43.0–77.0)
Platelets: 236 10*3/uL (ref 150.0–400.0)
RBC: 4.56 Mil/uL (ref 4.22–5.81)
RDW: 12.5 % (ref 11.5–15.5)
WBC: 7 10*3/uL (ref 4.0–10.5)

## 2014-05-09 LAB — URINALYSIS, ROUTINE W REFLEX MICROSCOPIC
Hgb urine dipstick: NEGATIVE
Ketones, ur: NEGATIVE
Leukocytes, UA: NEGATIVE
Nitrite: NEGATIVE
RBC / HPF: NONE SEEN (ref 0–?)
Specific Gravity, Urine: 1.025 (ref 1.000–1.030)
Total Protein, Urine: NEGATIVE
Urine Glucose: NEGATIVE
Urobilinogen, UA: 0.2 (ref 0.0–1.0)
WBC, UA: NONE SEEN (ref 0–?)
pH: 5.5 (ref 5.0–8.0)

## 2014-05-09 LAB — TSH: TSH: 0.73 u[IU]/mL (ref 0.35–4.50)

## 2014-05-09 LAB — LIPID PANEL
Cholesterol: 145 mg/dL (ref 0–200)
HDL: 48.1 mg/dL (ref >39.00)
LDL Cholesterol: 52 mg/dL (ref 0–99)
NonHDL: 96.9
Total CHOL/HDL Ratio: 3
Triglycerides: 223 mg/dL — ABNORMAL HIGH (ref 0.0–149.0)
VLDL: 44.6 mg/dL — ABNORMAL HIGH (ref 0.0–40.0)

## 2014-05-09 LAB — MICROALBUMIN / CREATININE URINE RATIO
Creatinine,U: 208.4 mg/dL
Microalb Creat Ratio: 0.9 mg/g (ref 0.0–30.0)
Microalb, Ur: 1.8 mg/dL (ref 0.0–1.9)

## 2014-05-09 LAB — BASIC METABOLIC PANEL WITH GFR
BUN: 14 mg/dL (ref 6–23)
CO2: 28 meq/L (ref 19–32)
Calcium: 9.7 mg/dL (ref 8.4–10.5)
Chloride: 101 meq/L (ref 96–112)
Creatinine, Ser: 0.9 mg/dL (ref 0.4–1.5)
GFR: 92.37 mL/min
Glucose, Bld: 153 mg/dL — ABNORMAL HIGH (ref 70–99)
Potassium: 4 meq/L (ref 3.5–5.1)
Sodium: 139 meq/L (ref 135–145)

## 2014-05-09 LAB — HEPATIC FUNCTION PANEL
ALT: 73 U/L — ABNORMAL HIGH (ref 0–53)
AST: 51 U/L — ABNORMAL HIGH (ref 0–37)
Albumin: 4 g/dL (ref 3.5–5.2)
Alkaline Phosphatase: 42 U/L (ref 39–117)
Bilirubin, Direct: 0.3 mg/dL (ref 0.0–0.3)
Total Bilirubin: 1.1 mg/dL (ref 0.2–1.2)
Total Protein: 6.6 g/dL (ref 6.0–8.3)

## 2014-05-09 LAB — PSA: PSA: 2.32 ng/mL (ref 0.10–4.00)

## 2014-05-09 LAB — HEMOGLOBIN A1C: Hgb A1c MFr Bld: 5.9 % (ref 4.6–6.5)

## 2014-05-09 LAB — URIC ACID: Uric Acid, Serum: 7.7 mg/dL (ref 4.0–7.8)

## 2014-05-09 MED ORDER — METHOCARBAMOL 500 MG PO TABS: 500.0000 mg | ORAL_TABLET | Freq: Every evening | ORAL | Status: DC | PRN

## 2014-05-09 NOTE — Progress Notes (Signed)
Subjective:    Patient ID: Larry Robertson, male    DOB: [birth year], 73 y.o.   MRN: [id number]  HPI Pt returns for f/u of type 2 DM (dx'ed 2010; he has mild if any neuropathy of the lower extremities; no known associated complications; he has never had pancreatitis, severe hypoglycemia, or DKA; he has never been on insulin).  Pt reports moderate cramps of the legs, worse in the context of sleep.  No assoc numbness.   Past Medical History  Diagnosis Date  . HEPATITIS C 08/25/2007  . DIABETES MELLITUS, TYPE II 08/25/2007  . HYPOGONADISM 09/27/2009  . 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  . 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 ERECTILE DYSFUNCTION 08/25/2007  . HYPERTENSION 08/25/2007  . Diastolic dysfunction     on echo  . 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 Arthritis     Past Surgical History  Procedure Laterality Date  . Lumbar fusion  2005  . Tonsillectomy and adenoidectomy    . Cholecystectomy  1999  . Abdominal hernia repair  2000    History   Social History  . Marital Status: Single    Spouse Name: N/A    Number of Children: 7  . Years of Education: N/A   Occupational History  . 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 Smoking status: Never Smoker   . Smokeless tobacco: Never Used  . Alcohol Use: Yes     Comment: 3-4 beers per week  . Drug Use: No  . Sexual Activity: Not on file   Other Topics Concern  . Not on file   Social History Narrative   Divorced    Current Outpatient Prescriptions on File Prior to Visit  Medication Sig Dispense Refill  . allopurinol (ZYLOPRIM) 300 MG tablet Take 300 mg by mouth daily.        Marland Kitchen amLODipine (NORVASC) 10 MG tablet Take 1 tablet (10 mg total) by mouth daily.  90 tablet  2  . aspirin 325 MG tablet Take 325 mg by mouth daily.        . colchicine 0.6 MG tablet Take 0.6 mg by mouth. 1 tablet every hour as needed for gout, not to exceed 6/day      . furosemide (LASIX) 20 MG tablet  Take 1 tablet (20 mg total) by mouth daily.  90 tablet  3  . lisinopril (PRINIVIL,ZESTRIL) 40 MG tablet Take 1 tablet (40 mg total) by mouth daily.  90 tablet  3  . metFORMIN (GLUCOPHAGE) 1000 MG tablet Take 1 tablet (1,000 mg total) by mouth 2 (two) times daily with a meal.  180 tablet  1  . omeprazole (PRILOSEC) 20 MG capsule Take 1 capsule (20 mg total) by mouth daily.  30 capsule  3  . oxyCODONE-acetaminophen (PERCOCET) 10-325 MG per tablet Take 1 tablet by mouth every 6 (six) hours as needed. For pain  100 tablet  0   No current facility-administered medications on file prior to visit.    No Known Allergies  Family History  Problem Relation Age of Onset  . Cancer Neg Hx   . Colon cancer Neg Hx   . Esophageal cancer Neg Hx   . Rectal cancer Neg Hx   . Stomach cancer Neg Hx   . Diabetes Maternal Grandmother     BP 120/70  Pulse 78  Temp(Src) 98.3 F (36.8 C) (Oral)  Ht 5' 11.5" (1.816 m)  Wt 205 lb (92.987 kg)  BMI 28.20 kg/m2  SpO2 96%  Review of Systems He has lost a few lbs.  Solid dysphagia has recurred.      Objective:   Physical Exam Pulses: dorsalis pedis intact bilat.   Feet: no deformity. normal color and temp.  no edema Skin:  no ulcer on the feet.  Healing sunburn Neuro: sensation is intact to touch on the feet   Lab Results  Component Value Date   TSH 0.73 05/09/2014   Lab Results  Component Value Date   HGBA1C 5.9 05/09/2014      Assessment & Plan:  Leg cramps, new, uncertain etiology Dysphagia: moderate exacerbation.  i discussed with dr Deatra Ina. DM: well-controlled.     Patient is advised the following: Patient Instructions  blood tests are being requested for you today.  We'll contact you with results.  i have sent a prescription to your pharmacy, for the cramps.   Do not take this along with the pain pill, due to an interaction.  Please come back for a regular physical appointment in 3 months.   Refer back to Dr Deatra Ina.  you will receive a  phone call, about a day and time for an appointment.

## 2014-05-09 NOTE — Patient Instructions (Addendum)
blood tests are being requested for you today.  We'll contact you with results.  i have sent a prescription to your pharmacy, for the cramps.   Do not take this along with the pain pill, due to an interaction.  Please come back for a regular physical appointment in 3 months.   Refer back to Dr Deatra Ina.  you will receive a phone call, about a day and time for an appointment.

## 2014-05-11 ENCOUNTER — Telehealth: Payer: Self-pay

## 2014-05-11 NOTE — Telephone Encounter (Signed)
Message copied by Algernon Huxley on Thu May 11, 2014  9:32 AM ------      Message from: Erskine Emery D      Created: Tue May 09, 2014  4:43 PM       I would be happy to.            Vaughan Basta, kindly make an appointment for this patient either myself or an extender for the next week or so.      ----- Message -----         From: Renato Shin, MD         Sent: 05/09/2014   2:10 PM           To: Inda Castle, MD            Rob            He is having solid dysphagia with eery meal.  Can you get him in soon?            Hilliard Clark             ------

## 2014-05-11 NOTE — Telephone Encounter (Signed)
Spoke with pt and he is scheduled to see Tye [facility] NP 05/18/14@8 :30am. Pt aware.

## 2014-05-17 ENCOUNTER — Encounter: Payer: Self-pay | Admitting: *Deleted

## 2014-05-18 ENCOUNTER — Encounter: Payer: Self-pay | Admitting: Nurse Practitioner

## 2014-05-18 ENCOUNTER — Ambulatory Visit (INDEPENDENT_AMBULATORY_CARE_PROVIDER_SITE_OTHER): Payer: PRIVATE HEALTH INSURANCE | Admitting: Nurse Practitioner

## 2014-05-18 VITALS — BP 122/76 | HR 76 | Ht 71.5 in | Wt 207.2 lb

## 2014-05-18 DIAGNOSIS — R1319 Other dysphagia: Secondary | ICD-10-CM

## 2014-05-18 DIAGNOSIS — R1310 Dysphagia, unspecified: Secondary | ICD-10-CM

## 2014-05-18 NOTE — Progress Notes (Signed)
     History of Present Illness:  Patient is a 73 year old male known to Dr. Deatra Ina for history of GERD and esophageal stricture. He had an EGD with [facility] dilation September 2014. 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 Patient reports 4-5 month reprieve before dysphagia began no recure.  No odynophagia but it does hurt when things get hung up in his esophagus. Patient is on daily PPI, no breakthrough heartburn.  Current Medications, Allergies, Past Medical History, Past Surgical History, Family History and Social History were reviewed in [employer] record.  Studies:    Physical Exam: General: Pleasant, well developed , white male in no acute distress Head: Normocephalic and atraumatic Eyes:  sclerae anicteric, conjunctiva pink  Ears: Normal auditory acuity Lungs: Clear throughout to auscultation Heart: Regular rate and rhythm Abdomen: Soft, non distended, non-tender. No masses, no hepatomegaly. Normal bowel sounds Musculoskeletal: Symmetrical with no gross deformities  Extremities: No edema  Neurological: Alert oriented x 4, grossly nonfocal Psychological:  Alert and cooperative. Normal mood and affect  Assessment and Recommendations:  73 year old male with history of GERD/esophageal stricture. Last EGD with [facility] dilation September 2014. Now with a several month history of recurrent solid food dysphasia. Will schedule for repeat EGD with dilation.The benefits, risks, and potential complications of EGD with possible biopsies and/or dilation were discussed with the patient and he agrees to proceed.

## 2014-05-18 NOTE — Patient Instructions (Addendum)
We discussed the importance of eating slowly, taking small bites of food, chewing well and consuming adequate amounts of fluid in between bites to avoid food impaction. You have been scheduled for an endoscopy with propofol. Please follow written instructions given to you at your visit today. If you use inhalers (even only as needed), please bring them with you on the day of your procedure. Your physician has requested that you go to www.startemmi.com and enter the access code given to you at your visit today. This web site gives a general overview about your procedure. However, you should still follow specific instructions given to you by our office regarding your preparation for the procedure.

## 2014-05-22 NOTE — Progress Notes (Signed)
Reviewed and agree with management. Lean Fayson D. Shizuye Rupert, M.D., FACG  

## 2014-05-24 ENCOUNTER — Ambulatory Visit (AMBULATORY_SURGERY_CENTER): Payer: PRIVATE HEALTH INSURANCE | Admitting: Gastroenterology

## 2014-05-24 ENCOUNTER — Encounter: Payer: Self-pay | Admitting: Gastroenterology

## 2014-05-24 VITALS — BP 140/90 | HR 66 | Temp 97.6°F | Resp 20 | Ht 71.5 in | Wt 207.0 lb

## 2014-05-24 DIAGNOSIS — R1310 Dysphagia, unspecified: Secondary | ICD-10-CM

## 2014-05-24 DIAGNOSIS — K222 Esophageal obstruction: Secondary | ICD-10-CM

## 2014-05-24 DIAGNOSIS — K269 Duodenal ulcer, unspecified as acute or chronic, without hemorrhage or perforation: Secondary | ICD-10-CM

## 2014-05-24 DIAGNOSIS — K319 Disease of stomach and duodenum, unspecified: Secondary | ICD-10-CM

## 2014-05-24 LAB — GLUCOSE, CAPILLARY
Glucose-Capillary: 120 mg/dL — ABNORMAL HIGH (ref 70–99)
Glucose-Capillary: 109 mg/dL — ABNORMAL HIGH (ref 70–99)

## 2014-05-24 MED ORDER — SODIUM CHLORIDE 0.9 % IV SOLN: 500.0000 mL | INTRAVENOUS | Status: DC

## 2014-05-24 NOTE — Progress Notes (Signed)
Report to PACU, RN, vss, BBS= Clear.  

## 2014-05-24 NOTE — Patient Instructions (Signed)
YOU HAD AN ENDOSCOPIC PROCEDURE TODAY AT [facility]: Refer to the procedure report that was given to you for any specific questions about what was found during the examination.  If the procedure report does not answer your questions, please call your gastroenterologist to clarify.  If you requested that your care partner not be given the details of your procedure findings, then the procedure report has been included in a sealed envelope for you to review at your convenience later.  YOU SHOULD EXPECT: Some feelings of bloating in the abdomen. Passage of more gas than usual.  Walking can help get rid of the air that was put into your GI tract during the procedure and reduce the bloating. If you had a lower endoscopy (such as a colonoscopy or flexible sigmoidoscopy) you may notice spotting of blood in your stool or on the toilet paper. If you underwent a bowel prep for your procedure, then you may not have a normal bowel movement for a few days.  DIET: follow dilatation diet given to you today   ACTIVITY: Your care partner should take you home directly after the procedure.  You should plan to take it easy, moving slowly for the rest of the day.  You can resume normal activity the day after the procedure however you should NOT DRIVE or use heavy machinery for 24 hours (because of the sedation medicines used during the test).    SYMPTOMS TO REPORT IMMEDIATELY: A gastroenterologist can be reached at any hour.  During normal business hours, 8:30 AM to 5:00 PM Monday through Friday, call [phone].  After hours and on weekends, please call the GI answering service at [phone] who will take a message and have the physician on call contact you.     Following upper endoscopy (EGD)  Vomiting of blood or coffee ground material  New chest pain or pain under the shoulder blades  Painful or persistently difficult swallowing  New shortness of breath  Fever of 100F or higher  Black,  tarry-looking stools  FOLLOW UP: If any biopsies were taken you will be contacted by phone or by letter within the next 1-3 weeks.  Call your gastroenterologist if you have not heard about the biopsies in 3 weeks.  Our staff will call the home number listed on your records the next business day following your procedure to check on you and address any questions or concerns that you may have at that time regarding the information given to you following your procedure. This is a courtesy call and so if there is no answer at the home number and we have not heard from you through the emergency physician on call, we will assume that you have returned to your regular daily activities without incident.  SIGNATURES/CONFIDENTIALITY: You and/or your care partner have signed paperwork which will be entered into your electronic medical record.  These signatures attest to the fact that that the information above on your After Visit Summary has been reviewed and is understood.  Full responsibility of the confidentiality of this discharge information lies with you and/or your care-partner.   Follow dilatation diet  Continue omeprazole  Make office visit for 8- 10 weeks

## 2014-05-24 NOTE — Op Note (Signed)
[facility]  [address] [place], [address]   ENDOSCOPY PROCEDURE REPORT  PATIENT: Larry Robertson, Larry Robertson  MR#: [id number] BIRTHDATE: [birth year] , 73  yrs. old GENDER: Male ENDOSCOPIST: Inda Castle, MD ASSISTANT: REFERRED BY: PROCEDURE DATE:  05/24/2014 PROCEDURE:   EGD with balloon dilatation ASA CLASS:   Class II INDICATIONS:dysphagia. MEDICATIONS: MAC sedation, administered by CRNA and Propofol (Diprivan) 170 mg IV TOPICAL ANESTHETIC:  DESCRIPTION OF PROCEDURE:   After the risks benefits and alternatives of the procedure were thoroughly explained, informed consent was obtained.  The     endoscope was introduced through the mouth  and advanced to the third portion of the duodenum ,      The instrument was slowly withdrawn as the mucosa was carefully examined.    At the GE junction there was a moderate stricture.  The 9 mm gastroscope easily traversed the stricture.  A small sliding hiatal hernia measuring 2-3 cm was present. There were superficial erosions the duodenal bulb.  Biopsies were taken.   At the GE junction there was a moderate stricture.  The 9 mm gastroscope easily traversed the stricture.  A small sliding hiatal hernia measuring 2-3 cm was present. There were superficial erosions the duodenal bulb.  Biopsies were taken.   The remainder of the upper endoscopy exam was otherwise normal.     Dilation was then performed at the gastroesphageal junction  Dilator:Balloon Size:15-16.5-18mm  Reststance:moderate Heme:yes Appearance:satisfactory  COMPLICATIONS: There were no complications. ENDOSCOPIC IMPRESSION: 1.   esophageal stricture-status post balloon dilation 2.  erosive duodenitis  RECOMMENDATIONS: 1.  continue omeprazole 2.  office visit 8-10 week  eSigned:  Inda Castle, MD 05/24/2014 2:32 PM  CC:

## 2014-05-25 ENCOUNTER — Telehealth: Payer: Self-pay | Admitting: *Deleted

## 2014-05-25 NOTE — Telephone Encounter (Signed)
  Follow up Call-  Call back number 05/24/2014 08/10/2013 05/19/2012  Post procedure Call Back phone  # [phone] cell  Permission to leave phone message Yes Yes Yes     Patient questions:  Do you have a fever, pain , or abdominal swelling? no Pain Score  0 *  Have you tolerated food without any problems? yes  Have you been able to return to your normal activities? yes  Do you have any questions about your discharge instructions: Diet   no Medications  no Follow up visit  no  Do you have questions or concerns about your Care? no  Actions: * If pain score is 4 or above: No action needed, pain <4.

## 2014-06-05 ENCOUNTER — Encounter: Payer: Self-pay | Admitting: Gastroenterology

## 2014-06-06 ENCOUNTER — Telehealth: Payer: Self-pay | Admitting: Endocrinology

## 2014-06-06 DIAGNOSIS — M5410 Radiculopathy, site unspecified: Secondary | ICD-10-CM

## 2014-06-06 MED ORDER — OXYCODONE-ACETAMINOPHEN 10-325 MG PO TABS: 1.0000 | ORAL_TABLET | Freq: Four times a day (QID) | ORAL | Status: DC | PRN

## 2014-06-06 NOTE — Telephone Encounter (Signed)
Pt needs refill on percocet please

## 2014-06-06 NOTE — Telephone Encounter (Signed)
i printed 

## 2014-06-06 NOTE — Telephone Encounter (Signed)
Left voice mail informing that rx is ready for pick up. Script placed upfront.

## 2014-06-06 NOTE — Telephone Encounter (Signed)
See below. Pt was last seen on 05/09/2014 and med was last filled on 05/04/2014.  Thanks!

## 2014-07-06 ENCOUNTER — Other Ambulatory Visit: Payer: Self-pay | Admitting: *Deleted

## 2014-07-06 ENCOUNTER — Telehealth: Payer: Self-pay | Admitting: Endocrinology

## 2014-07-06 DIAGNOSIS — M5410 Radiculopathy, site unspecified: Secondary | ICD-10-CM

## 2014-07-06 MED ORDER — OXYCODONE-ACETAMINOPHEN 10-325 MG PO TABS: 1.0000 | ORAL_TABLET | Freq: Four times a day (QID) | ORAL | Status: DC | PRN

## 2014-07-06 NOTE — Telephone Encounter (Signed)
Please advise 

## 2014-07-06 NOTE — Telephone Encounter (Signed)
30 pills till pcp back

## 2014-07-06 NOTE — Telephone Encounter (Signed)
Pt needs refill on percocet

## 2014-07-07 ENCOUNTER — Encounter: Payer: Self-pay | Admitting: Endocrinology

## 2014-07-13 ENCOUNTER — Telehealth: Payer: Self-pay | Admitting: Endocrinology

## 2014-07-13 DIAGNOSIS — M5410 Radiculopathy, site unspecified: Secondary | ICD-10-CM

## 2014-07-13 MED ORDER — OXYCODONE-ACETAMINOPHEN 10-325 MG PO TABS: 1.0000 | ORAL_TABLET | Freq: Four times a day (QID) | ORAL | Status: DC | PRN

## 2014-07-13 NOTE — Telephone Encounter (Signed)
Pt advised via voice mail that rx is ready for pick up. Pt advised to make physical when he comes in to pick up his rx. Script placed up front.

## 2014-07-13 NOTE — Telephone Encounter (Signed)
i printed cpx is due next month

## 2014-07-13 NOTE — Telephone Encounter (Signed)
See below. Oxycodone was last filled on 07/06/2014 and pt was last seen on 05/09/2014.  Thanks!

## 2014-07-13 NOTE — Telephone Encounter (Signed)
Refill on pain meds is needed pt has 1 or 2 pills left at this time. He picked up a quantity of 30 pills on 07/06/14

## 2014-07-14 ENCOUNTER — Other Ambulatory Visit: Payer: Self-pay | Admitting: Endocrinology

## 2014-07-14 DIAGNOSIS — M5410 Radiculopathy, site unspecified: Secondary | ICD-10-CM

## 2014-07-14 MED ORDER — OXYCODONE-ACETAMINOPHEN 10-325 MG PO TABS: 1.0000 | ORAL_TABLET | Freq: Four times a day (QID) | ORAL | Status: DC | PRN

## 2014-07-26 ENCOUNTER — Ambulatory Visit: Payer: PRIVATE HEALTH INSURANCE | Admitting: Gastroenterology

## 2014-08-09 ENCOUNTER — Ambulatory Visit (INDEPENDENT_AMBULATORY_CARE_PROVIDER_SITE_OTHER): Payer: PRIVATE HEALTH INSURANCE | Admitting: Endocrinology

## 2014-08-09 ENCOUNTER — Encounter: Payer: Self-pay | Admitting: Endocrinology

## 2014-08-09 VITALS — BP 122/70 | HR 74 | Temp 98.4°F | Ht 71.5 in | Wt 206.0 lb

## 2014-08-09 DIAGNOSIS — M5410 Radiculopathy, site unspecified: Secondary | ICD-10-CM

## 2014-08-09 DIAGNOSIS — Z23 Encounter for immunization: Secondary | ICD-10-CM

## 2014-08-09 DIAGNOSIS — IMO0002 Reserved for concepts with insufficient information to code with codable children: Secondary | ICD-10-CM

## 2014-08-09 DIAGNOSIS — Z0000 Encounter for general adult medical examination without abnormal findings: Secondary | ICD-10-CM

## 2014-08-09 DIAGNOSIS — R319 Hematuria, unspecified: Secondary | ICD-10-CM | POA: Insufficient documentation

## 2014-08-09 DIAGNOSIS — R351 Nocturia: Secondary | ICD-10-CM

## 2014-08-09 LAB — URINALYSIS, ROUTINE W REFLEX MICROSCOPIC
Bilirubin Urine: NEGATIVE
Hgb urine dipstick: NEGATIVE
Ketones, ur: NEGATIVE
Leukocytes, UA: NEGATIVE
Nitrite: NEGATIVE
Specific Gravity, Urine: <=1.005 — AB (ref 1.000–1.030)
Total Protein, Urine: NEGATIVE
Urine Glucose: NEGATIVE
Urobilinogen, UA: 0.2 (ref 0.0–1.0)
pH: 5.5 (ref 5.0–8.0)

## 2014-08-09 MED ORDER — OXYCODONE-ACETAMINOPHEN 10-325 MG PO TABS: 1.0000 | ORAL_TABLET | Freq: Four times a day (QID) | ORAL | Status: DC | PRN

## 2014-08-09 NOTE — Patient Instructions (Addendum)
A urine test is requested for you today.  We'll contact you with results. Try washing your face with head and shoulders or selsun blue shampoo.  Please call if this doesn't help.   please consider these measures for your health:  minimize alcohol.  do not use tobacco products.  have a colonoscopy at least every 10 years from age 74.  keep firearms safely stored.  always use seat belts.  have working smoke alarms in your home.  see an eye doctor and dentist regularly.  never drive under the influence of alcohol or drugs (including prescription drugs).  those with fair skin should take precautions against the sun. it is critically important to prevent falling down (keep floor areas well-lit, dry, and free of loose objects.  If you have a cane, walker, or wheelchair, you should use it, even for short trips around the house.  Also, try not to rush) Please come back for a follow-up appointment in 6 months.

## 2014-08-09 NOTE — Progress Notes (Signed)
Subjective:    Patient ID: Larry Robertson, male    DOB: 1941/10/14, 73 y.o.   MRN: [id number]  HPI Pt is here for regular wellness examination, and is feeling pretty well in general, and says chronic med probs are stable, except as noted below Past Medical History  Diagnosis Date  . HEPATITIS C 08/25/2007  . DIABETES MELLITUS, TYPE II 08/25/2007  . HYPOGONADISM 09/27/2009  . 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  . 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 ERECTILE DYSFUNCTION 08/25/2007  . HYPERTENSION 08/25/2007  . Diastolic dysfunction     on echo  . 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 Arthritis   . Diverticulosis   . Colon polyps 05/19/2012    Past Surgical History  Procedure Laterality Date  . Lumbar fusion  2005  . Tonsillectomy and adenoidectomy    . Cholecystectomy  1999  . Abdominal hernia repair  2000  . Colonoscopy      History   Social History  . Marital Status: Single    Spouse Name: N/A    Number of Children: 7  . Years of Education: N/A   Occupational History  . 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 Smoking status: Never Smoker   . Smokeless tobacco: Never Used  . Alcohol Use: Yes     Comment: 3-4 beers per week  . Drug Use: No  . Sexual Activity: Not on file   Other Topics Concern  . Not on file   Social History Narrative   Divorced    Current Outpatient Prescriptions on File Prior to Visit  Medication Sig Dispense Refill  . allopurinol (ZYLOPRIM) 300 MG tablet Take 300 mg by mouth daily.        Marland Kitchen amLODipine (NORVASC) 10 MG tablet Take 1 tablet (10 mg total) by mouth daily.  90 tablet  2  . aspirin 325 MG tablet Take 325 mg by mouth daily.        . colchicine 0.6 MG tablet Take 0.6 mg by mouth. 1 tablet every hour as needed for gout, not to exceed 6/day      . furosemide (LASIX) 20 MG tablet Take 1 tablet (20 mg total) by mouth daily.  90 tablet  3  . lisinopril (PRINIVIL,ZESTRIL) 40 MG tablet Take 1  tablet (40 mg total) by mouth daily.  90 tablet  3  . metFORMIN (GLUCOPHAGE) 1000 MG tablet Take 1 tablet (1,000 mg total) by mouth 2 (two) times daily with a meal.  180 tablet  1  . methocarbamol (ROBAXIN) 500 MG tablet Take 1 tablet (500 mg total) by mouth at bedtime as needed for muscle spasms.  30 tablet  5  . omeprazole (PRILOSEC) 20 MG capsule Take 1 capsule (20 mg total) by mouth daily.  30 capsule  3   No current facility-administered medications on file prior to visit.    No Known Allergies  Family History  Problem Relation Age of Onset  . Cancer Neg Hx   . Colon cancer Neg Hx   . Esophageal cancer Neg Hx   . Rectal cancer Neg Hx   . Stomach cancer Neg Hx   . Diabetes Maternal Grandmother     BP 122/70  Pulse 74  Temp(Src) 98.4 F (36.9 C) (Oral)  Ht 5' 11.5" (1.816 m)  Wt 206 lb (93.441 kg)  BMI 28.33 kg/m2  SpO2 97%   Review of Systems  Constitutional: Negative for fever and unexpected  weight change.  HENT: Negative for hearing loss.   Eyes: Negative for visual disturbance.  Respiratory: Negative for shortness of breath.   Cardiovascular: Negative for chest pain.  Gastrointestinal: Negative for anal bleeding.  Endocrine: Negative for cold intolerance.  Genitourinary: Negative for hematuria and difficulty urinating.  Musculoskeletal: Positive for back pain. Negative for gait problem.  Skin: Negative for wound.  Allergic/Immunologic: Negative for environmental allergies.  Neurological: Negative for syncope and numbness.  Hematological: Does not bruise/bleed easily.  Psychiatric/Behavioral: Negative for dysphoric mood.       Objective:   Physical Exam VS: see vs page GEN: no distress HEAD: head: no deformity eyes: no periorbital swelling, no proptosis external nose and ears are normal mouth: no lesion seen NECK: supple, thyroid is not enlarged CHEST WALL: no deformity LUNGS: clear to auscultation BREASTS:  No gynecomastia CV: reg rate and rhythm, no  murmur ABD: abdomen is soft, nontender.  no hepatosplenomegaly.  not distended.  no hernia   RECTAL: normal external and internal exam.  heme neg. PROSTATE:  Normal size.  No nodule MUSCULOSKELETAL: muscle bulk and strength are grossly normal.  no obvious joint swelling.  gait is normal and steady EXTEMITIES: no deformity.  no ulcer on the feet.  feet are of normal color and temp.  no edema PULSES: dorsalis pedis intact bilat.  no carotid bruit NEURO:  cn 2-12 grossly intact.   readily moves all 4's.  sensation is intact to touch on the feet. NODES:  None palpable at the neck PSYCH: alert, well-oriented.  Does not appear anxious nor depressed.   i reviewed electrocardiogram    Assessment & Plan:  Wellness visit today, with problems stable, except as noted. we discussed code status.  pt requests full code, but would not want to be started or maintained on artificial life-support measures if there was not a reasonable chance of recovery.     SEPARATE EVALUATION FOLLOWS--EACH PROBLEM HERE IS NEW, NOT RESPONDING TO TREATMENT, OR POSES SIGNIFICANT RISK TO THE PATIENT'S HEALTH: HISTORY OF THE PRESENT ILLNESS: Pt states 1 week of slight pain at the left flank, and assoc nocturia.   PAST MEDICAL HISTORY reviewed and up to date today REVIEW OF SYSTEMS: He has slight rash at the left malar area, but no itching there. PHYSICAL EXAMINATION: VITAL SIGNS:  See vs page GENERAL: no distress Skin: moderate malar rash. LAB/XRAY RESULTS: UA: hematuria IMPRESSION: Hematuria, new, uncertain etiology Rash, new, uncertain etiology PLAN: ref urol Try ZNP or selsun.  call if no help

## 2014-08-23 ENCOUNTER — Other Ambulatory Visit: Payer: Self-pay | Admitting: Gastroenterology

## 2014-09-12 ENCOUNTER — Telehealth: Payer: Self-pay | Admitting: Endocrinology

## 2014-09-12 DIAGNOSIS — M5410 Radiculopathy, site unspecified: Secondary | ICD-10-CM

## 2014-09-12 MED ORDER — OXYCODONE-ACETAMINOPHEN 10-325 MG PO TABS: 1.0000 | ORAL_TABLET | Freq: Four times a day (QID) | ORAL | Status: DC | PRN

## 2014-09-12 NOTE — Telephone Encounter (Signed)
Patient would ike a refill on his pain medication    Thank You

## 2014-09-12 NOTE — Telephone Encounter (Signed)
Pt advised that rx is ready for pick up. Script placed up front.  

## 2014-09-12 NOTE — Telephone Encounter (Signed)
See below. Medication was last refilled on 9/9 and pt was last seen on 9/9.

## 2014-09-12 NOTE — Telephone Encounter (Signed)
i printed 

## 2014-09-23 ENCOUNTER — Other Ambulatory Visit: Payer: Self-pay | Admitting: Endocrinology

## 2014-10-05 ENCOUNTER — Telehealth: Payer: Self-pay | Admitting: Endocrinology

## 2014-10-05 DIAGNOSIS — M5410 Radiculopathy, site unspecified: Secondary | ICD-10-CM

## 2014-10-05 MED ORDER — OXYCODONE-ACETAMINOPHEN 10-325 MG PO TABS: 1.0000 | ORAL_TABLET | Freq: Four times a day (QID) | ORAL | Status: DC | PRN

## 2014-10-05 NOTE — Telephone Encounter (Signed)
Pt advised that rx is ready for pick up. Rx placed up front.  

## 2014-10-05 NOTE — Telephone Encounter (Signed)
i printed 

## 2014-10-05 NOTE — Telephone Encounter (Signed)
See below. Pt was last see on 08/09/2014 and medication was last refilled on 09/12/2014. Thanks!

## 2014-10-05 NOTE — Telephone Encounter (Signed)
Patient would like a refill on his pain medication  ° ° °Please advise  ° °Thank You  °

## 2014-10-10 ENCOUNTER — Other Ambulatory Visit: Payer: Self-pay | Admitting: Endocrinology

## 2014-10-30 ENCOUNTER — Telehealth: Payer: Self-pay | Admitting: Endocrinology

## 2014-10-30 DIAGNOSIS — M5410 Radiculopathy, site unspecified: Secondary | ICD-10-CM

## 2014-10-30 MED ORDER — OXYCODONE-ACETAMINOPHEN 10-325 MG PO TABS: 1.0000 | ORAL_TABLET | Freq: Four times a day (QID) | ORAL | Status: DC | PRN

## 2014-10-30 NOTE — Telephone Encounter (Signed)
Patient states he needs a refill on his pain medication    Please advise   Thank you

## 2014-10-30 NOTE — Telephone Encounter (Signed)
Pt advised that rx is ready for pick up. Rx placed up front.  

## 2014-10-30 NOTE — Telephone Encounter (Signed)
i printed 

## 2014-10-30 NOTE — Telephone Encounter (Signed)
Please advise. Oxycodone was last refilled on 10/05/2014 and pt was last seen on 08/09/2014. Thanks!

## 2014-11-25 ENCOUNTER — Other Ambulatory Visit: Payer: Self-pay | Admitting: Gastroenterology

## 2014-11-27 ENCOUNTER — Telehealth: Payer: Self-pay | Admitting: Endocrinology

## 2014-11-27 DIAGNOSIS — M5410 Radiculopathy, site unspecified: Secondary | ICD-10-CM

## 2014-11-27 MED ORDER — OXYCODONE-ACETAMINOPHEN 10-325 MG PO TABS: 1.0000 | ORAL_TABLET | Freq: Four times a day (QID) | ORAL | Status: DC | PRN

## 2014-11-27 NOTE — Telephone Encounter (Signed)
Ok

## 2014-11-27 NOTE — Telephone Encounter (Signed)
Rx refilled. Called pt and called pt to advise he can pick up rx.

## 2014-11-27 NOTE — Telephone Encounter (Signed)
Pt requesting refill of pain meds. Please advise.

## 2014-11-27 NOTE — Telephone Encounter (Signed)
Pain meds need to be refilled

## 2014-11-27 NOTE — Addendum Note (Signed)
Addended by: Rockie Neighbours B on: 11/27/2014 01:46 PM   Modules accepted: Orders

## 2014-11-29 ENCOUNTER — Other Ambulatory Visit: Payer: Self-pay | Admitting: Endocrinology

## 2014-12-26 ENCOUNTER — Telehealth: Payer: Self-pay | Admitting: Endocrinology

## 2014-12-26 DIAGNOSIS — M5410 Radiculopathy, site unspecified: Secondary | ICD-10-CM

## 2014-12-26 MED ORDER — OXYCODONE-ACETAMINOPHEN 10-325 MG PO TABS: 1.0000 | ORAL_TABLET | Freq: Four times a day (QID) | ORAL | Status: DC | PRN

## 2014-12-26 NOTE — Telephone Encounter (Signed)
Patient would like a refill on his pain medication    Please advise    Thank you

## 2014-12-26 NOTE — Telephone Encounter (Signed)
i printed 

## 2014-12-26 NOTE — Telephone Encounter (Signed)
See below. Oxycodone was last refilled on 11/28/2015.  Thanks!

## 2014-12-26 NOTE — Telephone Encounter (Signed)
Pt advised that rx is ready for pick up rx placed up front.

## 2015-01-26 ENCOUNTER — Telehealth: Payer: Self-pay | Admitting: Endocrinology

## 2015-01-26 DIAGNOSIS — M5410 Radiculopathy, site unspecified: Secondary | ICD-10-CM

## 2015-01-26 MED ORDER — OXYCODONE-ACETAMINOPHEN 10-325 MG PO TABS: 1.0000 | ORAL_TABLET | Freq: Four times a day (QID) | ORAL | Status: DC | PRN

## 2015-01-26 NOTE — Telephone Encounter (Signed)
Pt needs refill on pain med percocet

## 2015-01-26 NOTE — Telephone Encounter (Signed)
i printed 

## 2015-01-26 NOTE — Telephone Encounter (Signed)
See note below. Rx was last printed on 12/02/2014 and pt was last seen on 08/09/2014.  Thanks!

## 2015-01-26 NOTE — Telephone Encounter (Signed)
Pt advised Rx placed up front for pick up.

## 2015-02-26 ENCOUNTER — Telehealth: Payer: Self-pay | Admitting: Endocrinology

## 2015-02-26 DIAGNOSIS — M5410 Radiculopathy, site unspecified: Secondary | ICD-10-CM

## 2015-02-26 MED ORDER — OXYCODONE-ACETAMINOPHEN 10-325 MG PO TABS: 1.0000 | ORAL_TABLET | Freq: Four times a day (QID) | ORAL | Status: DC | PRN

## 2015-02-26 NOTE — Telephone Encounter (Signed)
Pt advised rx is ready for pick up. Rx placed upfront.  

## 2015-02-26 NOTE — Telephone Encounter (Signed)
Patient need refill of medication Percocet

## 2015-02-26 NOTE — Telephone Encounter (Signed)
ok 

## 2015-02-26 NOTE — Telephone Encounter (Signed)
See below. Last refill was given on 01/25/2015.  Please advise if ok to refill during Dr. Cordelia Pen absence.  Thanks!

## 2015-03-05 ENCOUNTER — Other Ambulatory Visit: Payer: Self-pay | Admitting: Endocrinology

## 2015-03-05 ENCOUNTER — Other Ambulatory Visit: Payer: Self-pay | Admitting: Gastroenterology

## 2015-03-27 ENCOUNTER — Other Ambulatory Visit: Payer: Self-pay | Admitting: Endocrinology

## 2015-03-27 ENCOUNTER — Other Ambulatory Visit: Payer: Self-pay

## 2015-03-27 MED ORDER — LISINOPRIL 40 MG PO TABS: 40.0000 mg | ORAL_TABLET | Freq: Every day | ORAL | Status: DC

## 2015-03-28 ENCOUNTER — Telehealth: Payer: Self-pay

## 2015-03-28 DIAGNOSIS — M5410 Radiculopathy, site unspecified: Secondary | ICD-10-CM

## 2015-03-28 MED ORDER — OXYCODONE-ACETAMINOPHEN 10-325 MG PO TABS: 1.0000 | ORAL_TABLET | Freq: Four times a day (QID) | ORAL | Status: DC | PRN

## 2015-03-28 NOTE — Telephone Encounter (Signed)
i printed Ov is due 

## 2015-03-28 NOTE — Telephone Encounter (Signed)
Patient called requesting a refill on his Oxycodone. Last refill was 02/26/2015 Thanks!

## 2015-03-28 NOTE — Telephone Encounter (Signed)
Patient advised that rx is ready for pick up. When patient comes and picks up Rx he will schedule a office visit.

## 2015-04-03 ENCOUNTER — Telehealth: Payer: Self-pay | Admitting: *Deleted

## 2015-04-03 MED ORDER — OMEPRAZOLE 20 MG PO CPDR: 20.0000 mg | DELAYED_RELEASE_CAPSULE | Freq: Every day | ORAL | Status: DC

## 2015-04-03 NOTE — Telephone Encounter (Signed)
FAX REQUEST FROM PHARMACY REFILLED

## 2015-04-12 ENCOUNTER — Ambulatory Visit (INDEPENDENT_AMBULATORY_CARE_PROVIDER_SITE_OTHER): Payer: Medicare Other | Admitting: Endocrinology

## 2015-04-12 ENCOUNTER — Encounter: Payer: Self-pay | Admitting: Endocrinology

## 2015-04-12 VITALS — BP 134/88 | HR 77 | Temp 98.1°F | Wt 212.0 lb

## 2015-04-12 DIAGNOSIS — B1710 Acute hepatitis C without hepatic coma: Secondary | ICD-10-CM | POA: Diagnosis not present

## 2015-04-12 DIAGNOSIS — K222 Esophageal obstruction: Secondary | ICD-10-CM

## 2015-04-12 DIAGNOSIS — I10 Essential (primary) hypertension: Secondary | ICD-10-CM | POA: Diagnosis not present

## 2015-04-12 DIAGNOSIS — K219 Gastro-esophageal reflux disease without esophagitis: Secondary | ICD-10-CM

## 2015-04-12 DIAGNOSIS — E119 Type 2 diabetes mellitus without complications: Secondary | ICD-10-CM

## 2015-04-12 DIAGNOSIS — E781 Pure hyperglyceridemia: Secondary | ICD-10-CM

## 2015-04-12 LAB — URINALYSIS, ROUTINE W REFLEX MICROSCOPIC
Bilirubin Urine: NEGATIVE
Hgb urine dipstick: NEGATIVE
Ketones, ur: NEGATIVE
Leukocytes, UA: NEGATIVE
Nitrite: NEGATIVE
Specific Gravity, Urine: <=1.005 — AB (ref 1.000–1.030)
Total Protein, Urine: NEGATIVE
Urine Glucose: 500 — AB
Urobilinogen, UA: 0.2 (ref 0.0–1.0)
WBC, UA: NONE SEEN (ref 0–?)
pH: 6.5 (ref 5.0–8.0)

## 2015-04-12 LAB — HEPATIC FUNCTION PANEL
ALT: 177 U/L — ABNORMAL HIGH (ref 0–53)
AST: 114 U/L — ABNORMAL HIGH (ref 0–37)
Albumin: 3.7 g/dL (ref 3.5–5.2)
Alkaline Phosphatase: 75 U/L (ref 39–117)
Bilirubin, Direct: 0.3 mg/dL (ref 0.0–0.3)
Total Bilirubin: 0.7 mg/dL (ref 0.2–1.2)
Total Protein: 6.6 g/dL (ref 6.0–8.3)

## 2015-04-12 LAB — CBC WITH DIFFERENTIAL/PLATELET
Basophils Absolute: 0 10*3/uL (ref 0.0–0.1)
Basophils Relative: 0.7 % (ref 0.0–3.0)
Eosinophils Absolute: 0.1 10*3/uL (ref 0.0–0.7)
Eosinophils Relative: 2.7 % (ref 0.0–5.0)
HCT: 42.3 % (ref 39.0–52.0)
Hemoglobin: 14.6 g/dL (ref 13.0–17.0)
Lymphocytes Relative: 28.8 % (ref 12.0–46.0)
Lymphs Abs: 1.4 10*3/uL (ref 0.7–4.0)
MCHC: 34.4 g/dL (ref 30.0–36.0)
MCV: 91.3 fl (ref 78.0–100.0)
Monocytes Absolute: 0.4 10*3/uL (ref 0.1–1.0)
Monocytes Relative: 9.1 % (ref 3.0–12.0)
Neutro Abs: 2.8 10*3/uL (ref 1.4–7.7)
Neutrophils Relative %: 58.7 % (ref 43.0–77.0)
Platelets: 159 10*3/uL (ref 150.0–400.0)
RBC: 4.64 Mil/uL (ref 4.22–5.81)
RDW: 13.1 % (ref 11.5–15.5)
WBC: 4.8 10*3/uL (ref 4.0–10.5)

## 2015-04-12 LAB — BASIC METABOLIC PANEL
BUN: 11 mg/dL (ref 6–23)
CO2: 31 mEq/L (ref 19–32)
Calcium: 9.2 mg/dL (ref 8.4–10.5)
Chloride: 100 mEq/L (ref 96–112)
Creatinine, Ser: 0.8 mg/dL (ref 0.40–1.50)
GFR: 100.15 mL/min (ref >60.00)
Glucose, Bld: 208 mg/dL — ABNORMAL HIGH (ref 70–99)
Potassium: 4.2 mEq/L (ref 3.5–5.1)
Sodium: 136 mEq/L (ref 135–145)

## 2015-04-12 LAB — MICROALBUMIN / CREATININE URINE RATIO
Creatinine,U: 23.7 mg/dL
Microalb Creat Ratio: 3 mg/g (ref 0.0–30.0)
Microalb, Ur: <0.7 mg/dL (ref 0.0–1.9)

## 2015-04-12 LAB — HEMOGLOBIN A1C: Hgb A1c MFr Bld: 6.2 % (ref 4.6–6.5)

## 2015-04-12 LAB — LIPID PANEL
Cholesterol: 140 mg/dL (ref 0–200)
HDL: 42.9 mg/dL (ref >39.00)
NonHDL: 97.1
Total CHOL/HDL Ratio: 3
Triglycerides: 250 mg/dL — ABNORMAL HIGH (ref 0.0–149.0)
VLDL: 50 mg/dL — ABNORMAL HIGH (ref 0.0–40.0)

## 2015-04-12 LAB — TSH: TSH: 1.36 u[IU]/mL (ref 0.35–4.50)

## 2015-04-12 LAB — LDL CHOLESTEROL, DIRECT: Direct LDL: 62 mg/dL

## 2015-04-12 MED ORDER — LOSARTAN POTASSIUM 100 MG PO TABS: 100.0000 mg | ORAL_TABLET | Freq: Every day | ORAL | Status: DC

## 2015-04-12 NOTE — Progress Notes (Signed)
Subjective:    Patient ID: Larry Robertson, male    DOB: December 18, 1940, 74 y.o.   MRN: [id number]  HPI Pt states few years of slight cough in the throat, but no assoc sob.  He has never smoked.  Low-back pain persists.  pecocet helps.   Past Medical History  Diagnosis Date  . HEPATITIS C 08/25/2007  . DIABETES MELLITUS, TYPE II 08/25/2007  . HYPOGONADISM 09/27/2009  . 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  . 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 ERECTILE DYSFUNCTION 08/25/2007  . HYPERTENSION 08/25/2007  . Diastolic dysfunction     on echo  . 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 Arthritis   . Diverticulosis   . Colon polyps 05/19/2012    Past Surgical History  Procedure Laterality Date  . Lumbar fusion  2005  . Tonsillectomy and adenoidectomy    . Cholecystectomy  1999  . Abdominal hernia repair  2000  . Colonoscopy      History   Social History  . Marital Status: Single    Spouse Name: N/A  . Number of Children: 7  . Years of Education: N/A   Occupational History  . 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 Smoking status: Never Smoker   . Smokeless tobacco: Never Used  . Alcohol Use: Yes     Comment: 3-4 beers per week  . Drug Use: No  . Sexual Activity: Not on file   Other Topics Concern  . Not on file   Social History Narrative   Divorced    Current Outpatient Prescriptions on File Prior to Visit  Medication Sig Dispense Refill  . allopurinol (ZYLOPRIM) 300 MG tablet Take 300 mg by mouth daily.      Marland Kitchen amLODipine (NORVASC) 10 MG tablet TAKE 1 TABLET BY MOUTH EVERY DAY 90 tablet 0  . aspirin 325 MG tablet Take 325 mg by mouth daily.      . colchicine 0.6 MG tablet Take 0.6 mg by mouth. 1 tablet every hour as needed for gout, not to exceed 6/day    . furosemide (LASIX) 20 MG tablet TAKE 1 TABLET (20 MG TOTAL) BY MOUTH DAILY. 90 tablet 3  . metFORMIN (GLUCOPHAGE) 1000 MG tablet TAKE 1 TABLET BY MOUTH 2 TIMES DAILY WITH A  MEAL. 180 tablet 1  . methocarbamol (ROBAXIN) 500 MG tablet Take 1 tablet (500 mg total) by mouth at bedtime as needed for muscle spasms. 30 tablet 5  . omeprazole (PRILOSEC) 20 MG capsule Take 1 capsule (20 mg total) by mouth daily. 90 capsule 3  . oxyCODONE-acetaminophen (PERCOCET) 10-325 MG per tablet Take 1 tablet by mouth every 6 (six) hours as needed. For pain 120 tablet 0   No current facility-administered medications on file prior to visit.    No Known Allergies  Family History  Problem Relation Age of Onset  . Cancer Neg Hx   . Colon cancer Neg Hx   . Esophageal cancer Neg Hx   . Rectal cancer Neg Hx   . Stomach cancer Neg Hx   . Diabetes Maternal Grandmother     BP 134/88 mmHg  Pulse 77  Temp(Src) 98.1 F (36.7 C) (Oral)  Wt 212 lb (96.163 kg)  SpO2 95%   Review of Systems Denies chest pain and weight change.    Objective:   Physical Exam VITAL SIGNS:  See vs page GENERAL: no distress Pulses: dorsalis pedis intact bilat.   MSK: no deformity  of the feet CV: no leg edema Skin:  no ulcer on the feet.  normal color and temp on the feet.  There is a few mm callus at the plantar aspect of the right foot, under the little toe MTP head.  Neuro: sensation is intact to touch on the feet.   Lab Results  Component Value Date   WBC 4.8 04/12/2015   HGB 14.6 04/12/2015   HCT 42.3 04/12/2015   PLT 159.0 04/12/2015   GLUCOSE 208* 04/12/2015   CHOL 140 04/12/2015   TRIG 250.0* 04/12/2015   HDL 42.90 04/12/2015   LDLDIRECT 62.0 04/12/2015   LDLCALC 52 05/09/2014   ALT 177* 04/12/2015   AST 114* 04/12/2015   NA 136 04/12/2015   K 4.2 04/12/2015   CL 100 04/12/2015   CREATININE 0.80 04/12/2015   BUN 11 04/12/2015   CO2 31 04/12/2015   TSH 1.36 04/12/2015   PSA 2.32 05/09/2014   INR 1.0 07/13/2013   HGBA1C 6.2 04/12/2015   MICROALBUR <0.7 04/12/2015       Assessment & Plan:  Cough, new, prob due to lisinopril Skin nodule, new, prob callus (wart less  likely) Hep-C: worse.    Patient is advised the following: Patient Instructions  i have sent a prescription to your pharmacy, to change the lisinopril to a pill that does not cause a cough. Keep the nodule on your foot covered.  Call if it persists, so i could send you to a foot doctor. blood tests are requested for you today.  We'll let you know about the results.  Please come back for a follow-up appointment in 6 months  addendum: i advised re back to hepatology specialist.

## 2015-04-12 NOTE — Patient Instructions (Addendum)
i have sent a prescription to your pharmacy, to change the lisinopril to a pill that does not cause a cough. Keep the nodule on your foot covered.  Call if it persists, so i could send you to a foot doctor. blood tests are requested for you today.  We'll let you know about the results.  Please come back for a follow-up appointment in 6 months

## 2015-04-14 ENCOUNTER — Other Ambulatory Visit: Payer: Self-pay | Admitting: Endocrinology

## 2015-04-27 ENCOUNTER — Telehealth: Payer: Self-pay | Admitting: Endocrinology

## 2015-04-27 DIAGNOSIS — M5410 Radiculopathy, site unspecified: Secondary | ICD-10-CM

## 2015-04-27 MED ORDER — OXYCODONE-ACETAMINOPHEN 10-325 MG PO TABS: 1.0000 | ORAL_TABLET | Freq: Four times a day (QID) | ORAL | Status: DC | PRN

## 2015-04-27 NOTE — Telephone Encounter (Signed)
Patient advised rx is ready for pick up. Rx placed upfront.

## 2015-04-27 NOTE — Telephone Encounter (Signed)
See below. Rx for Oxycodone was last refilled on 03/28/2015.

## 2015-04-27 NOTE — Telephone Encounter (Signed)
Pt needs refill on pain meds please °

## 2015-04-27 NOTE — Telephone Encounter (Signed)
i printed 

## 2015-05-25 DIAGNOSIS — E119 Type 2 diabetes mellitus without complications: Secondary | ICD-10-CM | POA: Diagnosis not present

## 2015-05-25 LAB — HM DIABETES EYE EXAM

## 2015-05-28 ENCOUNTER — Telehealth: Payer: Self-pay | Admitting: Endocrinology

## 2015-05-28 DIAGNOSIS — M5410 Radiculopathy, site unspecified: Secondary | ICD-10-CM

## 2015-05-28 MED ORDER — OXYCODONE-ACETAMINOPHEN 10-325 MG PO TABS: 1.0000 | ORAL_TABLET | Freq: Four times a day (QID) | ORAL | Status: DC | PRN

## 2015-05-28 NOTE — Telephone Encounter (Signed)
Pt advised that rx is ready for pick up. Rx placed up front.  

## 2015-05-28 NOTE — Telephone Encounter (Signed)
i printed 

## 2015-05-28 NOTE — Telephone Encounter (Signed)
See note below and please advise if ok to refill. Rx was last wrritten on 04/27/2015.  Thanks!

## 2015-05-28 NOTE — Telephone Encounter (Signed)
Patient called and would like a refill on his pain medication   Please advise    Thank you

## 2015-05-31 ENCOUNTER — Other Ambulatory Visit: Payer: Self-pay | Admitting: Internal Medicine

## 2015-06-25 ENCOUNTER — Telehealth: Payer: Self-pay | Admitting: Endocrinology

## 2015-06-25 DIAGNOSIS — M5410 Radiculopathy, site unspecified: Secondary | ICD-10-CM

## 2015-06-25 NOTE — Telephone Encounter (Signed)
Patient would like a refill on his pain medication    Thank you

## 2015-06-25 NOTE — Telephone Encounter (Signed)
Pt is requesting a refill on his oxycodone. Last rx was sent on 05/28/2015. Please advise if you can sign off during Dr. Cordelia Pen absence. Thanks!

## 2015-06-26 ENCOUNTER — Telehealth: Payer: Self-pay

## 2015-06-26 MED ORDER — OXYCODONE-ACETAMINOPHEN 10-325 MG PO TABS: 1.0000 | ORAL_TABLET | Freq: Four times a day (QID) | ORAL | Status: DC | PRN

## 2015-06-26 NOTE — Telephone Encounter (Signed)
Printed for Dr Cruzita Lederer to sign in Dr Cordelia Pen absence.

## 2015-06-26 NOTE — Telephone Encounter (Signed)
Error

## 2015-06-26 NOTE — Telephone Encounter (Signed)
Could you print the Oxycodone and have Dr. Cruzita Lederer sign for Dr. Loanne Drilling? Thanks!

## 2015-06-26 NOTE — Telephone Encounter (Signed)
OK 

## 2015-06-26 NOTE — Telephone Encounter (Signed)
See below- thanks!

## 2015-06-29 DIAGNOSIS — D225 Melanocytic nevi of trunk: Secondary | ICD-10-CM | POA: Diagnosis not present

## 2015-06-29 DIAGNOSIS — L570 Actinic keratosis: Secondary | ICD-10-CM | POA: Diagnosis not present

## 2015-06-29 DIAGNOSIS — C44319 Basal cell carcinoma of skin of other parts of face: Secondary | ICD-10-CM | POA: Diagnosis not present

## 2015-06-29 DIAGNOSIS — X32XXXA Exposure to sunlight, initial encounter: Secondary | ICD-10-CM | POA: Diagnosis not present

## 2015-06-29 DIAGNOSIS — C44310 Basal cell carcinoma of skin of unspecified parts of face: Secondary | ICD-10-CM | POA: Diagnosis not present

## 2015-07-26 ENCOUNTER — Telehealth: Payer: Self-pay | Admitting: Endocrinology

## 2015-07-26 DIAGNOSIS — M5410 Radiculopathy, site unspecified: Secondary | ICD-10-CM

## 2015-07-26 MED ORDER — OXYCODONE-ACETAMINOPHEN 10-325 MG PO TABS: 1.0000 | ORAL_TABLET | Freq: Four times a day (QID) | ORAL | Status: DC | PRN

## 2015-07-26 NOTE — Telephone Encounter (Signed)
Patient called stating that he would like a refill on his pain medication    Please advise    Thank You

## 2015-07-26 NOTE — Telephone Encounter (Signed)
i printed 

## 2015-07-26 NOTE — Telephone Encounter (Signed)
See note below. Rx for Oxycodone was last written on 06/27/2015. Thanks!

## 2015-07-26 NOTE — Telephone Encounter (Signed)
Pt advised rx is ready for pick up. Rx placed upfront.  

## 2015-08-27 ENCOUNTER — Telehealth: Payer: Self-pay | Admitting: Endocrinology

## 2015-08-27 DIAGNOSIS — M5410 Radiculopathy, site unspecified: Secondary | ICD-10-CM

## 2015-08-27 MED ORDER — OXYCODONE-ACETAMINOPHEN 10-325 MG PO TABS: 1.0000 | ORAL_TABLET | Freq: Four times a day (QID) | ORAL | Status: DC | PRN

## 2015-08-27 NOTE — Telephone Encounter (Signed)
Pt called and notified rx is ready for pick up. Rx placed up front.

## 2015-08-27 NOTE — Telephone Encounter (Signed)
Pt needs pain med refill  °

## 2015-08-27 NOTE — Telephone Encounter (Signed)
See note below. Oxycodone was last refilled on 07/26/2015  Thanks!

## 2015-08-27 NOTE — Telephone Encounter (Signed)
i printed 

## 2015-08-28 ENCOUNTER — Other Ambulatory Visit: Payer: Self-pay | Admitting: Endocrinology

## 2015-09-21 DIAGNOSIS — N139 Obstructive and reflux uropathy, unspecified: Secondary | ICD-10-CM | POA: Diagnosis not present

## 2015-09-24 ENCOUNTER — Telehealth: Payer: Self-pay | Admitting: Endocrinology

## 2015-09-24 DIAGNOSIS — M5410 Radiculopathy, site unspecified: Secondary | ICD-10-CM

## 2015-09-24 MED ORDER — OXYCODONE-ACETAMINOPHEN 10-325 MG PO TABS: 1.0000 | ORAL_TABLET | Freq: Four times a day (QID) | ORAL | Status: DC | PRN

## 2015-09-24 NOTE — Telephone Encounter (Signed)
Pt needs pain med refill  °

## 2015-09-24 NOTE — Telephone Encounter (Signed)
Patient advised Rx is ready for pick up. Rx placed up front.  

## 2015-09-24 NOTE — Telephone Encounter (Signed)
i printed 

## 2015-09-24 NOTE — Telephone Encounter (Signed)
Please advise if ok to refill medication. Oxycodone was last refilled on 08/27/2015. Thanks!

## 2015-09-28 DIAGNOSIS — R358 Other polyuria: Secondary | ICD-10-CM | POA: Diagnosis not present

## 2015-09-28 DIAGNOSIS — R351 Nocturia: Secondary | ICD-10-CM | POA: Diagnosis not present

## 2015-10-15 ENCOUNTER — Ambulatory Visit (INDEPENDENT_AMBULATORY_CARE_PROVIDER_SITE_OTHER): Payer: Medicare Other | Admitting: Endocrinology

## 2015-10-15 ENCOUNTER — Encounter: Payer: Self-pay | Admitting: Endocrinology

## 2015-10-15 VITALS — BP 134/88 | HR 71 | Temp 98.3°F | Ht 71.5 in | Wt 214.0 lb

## 2015-10-15 DIAGNOSIS — E119 Type 2 diabetes mellitus without complications: Secondary | ICD-10-CM | POA: Diagnosis not present

## 2015-10-15 DIAGNOSIS — Z0000 Encounter for general adult medical examination without abnormal findings: Secondary | ICD-10-CM | POA: Diagnosis not present

## 2015-10-15 DIAGNOSIS — Z23 Encounter for immunization: Secondary | ICD-10-CM | POA: Diagnosis not present

## 2015-10-15 DIAGNOSIS — R351 Nocturia: Secondary | ICD-10-CM | POA: Diagnosis not present

## 2015-10-15 LAB — POCT GLYCOSYLATED HEMOGLOBIN (HGB A1C): Hemoglobin A1C: 7.9

## 2015-10-15 MED ORDER — SITAGLIPTIN PHOSPHATE 100 MG PO TABS: 100.0000 mg | ORAL_TABLET | Freq: Every day | ORAL | Status: DC

## 2015-10-15 NOTE — Progress Notes (Signed)
we discussed code status.  pt requests full code, but would not want to be started or maintained on artificial life-support measures if there was not a reasonable chance of recovery 

## 2015-10-15 NOTE — Patient Instructions (Addendum)
i have sent a prescription to your pharmacy, to add "[place]." Please come back for a regular physical appointment in 3 months.  please consider these measures for your health:  minimize alcohol.  do not use tobacco products.  have a colonoscopy at least every 10 years from age 75.  keep firearms safely stored.  always use seat belts.  have working smoke alarms in your home.  see an eye doctor and dentist regularly.  never drive under the influence of alcohol or drugs (including prescription drugs).  those with fair skin should take precautions against the sun. it is critically important to prevent falling down (keep floor areas well-lit, dry, and free of loose objects.  If you have a cane, walker, or wheelchair, you should use it, even for short trips around the house.  Also, try not to rush).

## 2015-10-15 NOTE — Progress Notes (Signed)
Subjective:    Patient ID: Larry Robertson, male    DOB: 10-Jan-1941, 74 y.o.   MRN: [id number]  HPI Pt returns for f/u of diabetes mellitus: DM type: 2 Dx'ed: [id number] Complications: none Therapy: metformin DKA: never Severe hypoglycemia: never Pancreatitis: never Other: edema limits oral rx options Interval history: pt states he feels well in general.  He does not check cbg's. Past Medical History  Diagnosis Date  . HEPATITIS C 08/25/2007  . DIABETES MELLITUS, TYPE II 08/25/2007  . HYPOGONADISM 09/27/2009  . 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  . 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 ERECTILE DYSFUNCTION 08/25/2007  . HYPERTENSION 08/25/2007  . Diastolic dysfunction     on echo  . 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 Arthritis   . Diverticulosis   . Colon polyps 05/19/2012    Past Surgical History  Procedure Laterality Date  . Lumbar fusion  2005  . Tonsillectomy and adenoidectomy    . Cholecystectomy  1999  . Abdominal hernia repair  2000  . Colonoscopy      Social History   Social History  . Marital Status: Single    Spouse Name: N/A  . Number of Children: 7  . Years of Education: N/A   Occupational History  . 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 Smoking status: Never Smoker   . Smokeless tobacco: Never Used  . Alcohol Use: Yes     Comment: 3-4 beers per week  . Drug Use: No  . Sexual Activity: Not on file   Other Topics Concern  . Not on file   Social History Narrative   Divorced    Current Outpatient Prescriptions on File Prior to Visit  Medication Sig Dispense Refill  . allopurinol (ZYLOPRIM) 300 MG tablet Take 300 mg by mouth daily.      Marland Kitchen amLODipine (NORVASC) 10 MG tablet TAKE 1 TABLET BY MOUTH EVERY DAY 90 tablet 0  . aspirin 325 MG tablet Take 325 mg by mouth daily.      . colchicine 0.6 MG tablet Take 0.6 mg by mouth. 1 tablet every hour as needed for gout, not to exceed 6/day    . furosemide  (LASIX) 20 MG tablet TAKE 1 TABLET (20 MG TOTAL) BY MOUTH DAILY. 90 tablet 3  . losartan (COZAAR) 100 MG tablet Take 1 tablet (100 mg total) by mouth daily. 90 tablet 3  . metFORMIN (GLUCOPHAGE) 1000 MG tablet TAKE 1 TABLET BY MOUTH 2 TIMES DAILY WITH A MEAL. 180 tablet 1  . methocarbamol (ROBAXIN) 500 MG tablet Take 1 tablet (500 mg total) by mouth at bedtime as needed for muscle spasms. 30 tablet 5  . omeprazole (PRILOSEC) 20 MG capsule Take 1 capsule (20 mg total) by mouth daily. 90 capsule 3  . oxyCODONE-acetaminophen (PERCOCET) 10-325 MG tablet Take 1 tablet by mouth every 6 (six) hours as needed. For pain 120 tablet 0   No current facility-administered medications on file prior to visit.    No Known Allergies  Family History  Problem Relation Age of Onset  . Cancer Neg Hx   . Colon cancer Neg Hx   . Esophageal cancer Neg Hx   . Rectal cancer Neg Hx   . Stomach cancer Neg Hx   . Diabetes Maternal Grandmother     BP 134/88 mmHg  Pulse 71  Temp(Src) 98.3 F (36.8 C) (Oral)  Ht 5' 11.5" (1.816 m)  Wt 214 lb (97.07 kg)  BMI 29.43 kg/m2  SpO2 96%   Review of Systems No weight change    Objective:   Physical Exam VITAL SIGNS:  See vs page GENERAL: no distress Pulses: dorsalis pedis intact bilat.   MSK: no deformity of the feet CV: 1+ bilat leg edema Skin:  no ulcer on the feet.  normal color and temp on the feet. Neuro: sensation is intact to touch on the feet    A1c=7.9% i personally reviewed electrocardiogram tracing (today): Indication: DM Impression: ? old AMI    Assessment & Plan:  DM: he needs increased rx.   Subjective:   Patient here for Medicare annual wellness visit and management of other chronic and acute problems.     Risk factors: advanced age    52 of Physicians Providing Medical Care to Patient:  See "snapshot"   Activities of Daily Living: In your present state of health, do you have any difficulty performing the following  activities (lives alone)?:  Preparing food and eating?: No  Bathing yourself: No  Getting dressed: No  Using the toilet: No  Moving around from place to place: No  In the past year have you fallen or had a near fall?:No    Home Safety: Has smoke detector and wears seat belts. No firearms. No excess sun exposure.  Diet and Exercise  Current exercise habits: pt says good Dietary issues discussed: pt reports a healthy diet.    Depression Screen  Q1: Over the past two weeks, have you felt down, depressed or hopeless? no  Q2: Over the past two weeks, have you felt little interest or pleasure in doing things? no   The following portions of the patient's history were reviewed and updated as appropriate: allergies, current medications, past family history, past medical history, past social history, past surgical history and problem list.   Review of Systems  Denies hearing loss, and visual loss Objective:   Vision:  Advertising account executive.  Hearing: grossly normal Body mass index:  See vs page Msk: pt easily and quickly performs "get-up-and-go" from a sitting position.   Cognitive Impairment Assessment: cognition, memory and judgment appear normal.  remembers 3/3 at 5 minutes.  excellent recall.  can easily read and write a sentence.  alert and oriented x 3.   Assessment:   Medicare wellness utd on preventive parameters    Plan:   During the course of the visit the patient was educated and counseled about appropriate screening and preventive services including:       Fall prevention    Diabetes screening  Nutrition counseling   Vaccines / LABS Zostavax / Pneumococcal Vaccine  today  PSA  Patient Instructions (the written plan) was given to the patient.

## 2015-10-19 ENCOUNTER — Other Ambulatory Visit: Payer: Self-pay | Admitting: Endocrinology

## 2015-10-23 ENCOUNTER — Telehealth: Payer: Self-pay | Admitting: Endocrinology

## 2015-10-23 DIAGNOSIS — M5410 Radiculopathy, site unspecified: Secondary | ICD-10-CM

## 2015-10-23 DIAGNOSIS — R351 Nocturia: Secondary | ICD-10-CM | POA: Diagnosis not present

## 2015-10-23 MED ORDER — OXYCODONE-ACETAMINOPHEN 10-325 MG PO TABS: 1.0000 | ORAL_TABLET | Freq: Four times a day (QID) | ORAL | Status: DC | PRN

## 2015-10-23 NOTE — Telephone Encounter (Signed)
Patient called and would like a refill on his pain medication  ° ° °Thank you  °

## 2015-10-23 NOTE — Telephone Encounter (Signed)
I contacted the pt and advise Rx is ready for pick up. Rx placed up front.

## 2015-10-23 NOTE — Telephone Encounter (Signed)
See note below

## 2015-10-23 NOTE — Telephone Encounter (Signed)
i printed 

## 2015-11-20 ENCOUNTER — Telehealth: Payer: Self-pay | Admitting: Endocrinology

## 2015-11-20 DIAGNOSIS — M5410 Radiculopathy, site unspecified: Secondary | ICD-10-CM

## 2015-11-20 MED ORDER — OXYCODONE-ACETAMINOPHEN 10-325 MG PO TABS: 1.0000 | ORAL_TABLET | Freq: Four times a day (QID) | ORAL | Status: DC | PRN

## 2015-11-20 NOTE — Telephone Encounter (Signed)
i printed 

## 2015-11-20 NOTE — Telephone Encounter (Signed)
Patient need a refill of oxyCODONE-acetaminophen (PERCOCET) 10-325 MG tablet

## 2015-11-20 NOTE — Telephone Encounter (Signed)
I contacted the pt and advise his Rx is ready for pick up. Rx placed up front.

## 2015-11-20 NOTE — Telephone Encounter (Signed)
See note below   Thanks

## 2015-12-04 ENCOUNTER — Other Ambulatory Visit: Payer: Self-pay | Admitting: Endocrinology

## 2015-12-19 ENCOUNTER — Telehealth: Payer: Self-pay | Admitting: Endocrinology

## 2015-12-19 DIAGNOSIS — M5410 Radiculopathy, site unspecified: Secondary | ICD-10-CM

## 2015-12-19 MED ORDER — OXYCODONE-ACETAMINOPHEN 10-325 MG PO TABS: 1.0000 | ORAL_TABLET | Freq: Four times a day (QID) | ORAL | Status: DC | PRN

## 2015-12-19 NOTE — Telephone Encounter (Signed)
Patient need a refill of his pain medication Oxycodone

## 2015-12-19 NOTE — Telephone Encounter (Signed)
See note below

## 2015-12-19 NOTE — Telephone Encounter (Signed)
Pt advised rx is ready for pick up. Rx placed upfront.  

## 2015-12-19 NOTE — Telephone Encounter (Signed)
i printed 

## 2016-01-15 ENCOUNTER — Encounter: Payer: Self-pay | Admitting: Endocrinology

## 2016-01-15 ENCOUNTER — Ambulatory Visit (INDEPENDENT_AMBULATORY_CARE_PROVIDER_SITE_OTHER): Payer: Medicare Other | Admitting: Endocrinology

## 2016-01-15 VITALS — BP 128/88 | HR 78 | Temp 97.5°F | Ht 71.0 in | Wt 215.0 lb

## 2016-01-15 DIAGNOSIS — M5410 Radiculopathy, site unspecified: Secondary | ICD-10-CM | POA: Diagnosis not present

## 2016-01-15 DIAGNOSIS — E119 Type 2 diabetes mellitus without complications: Secondary | ICD-10-CM

## 2016-01-15 LAB — POCT GLYCOSYLATED HEMOGLOBIN (HGB A1C): Hemoglobin A1C: 6.3

## 2016-01-15 MED ORDER — OXYCODONE-ACETAMINOPHEN 10-325 MG PO TABS: 1.0000 | ORAL_TABLET | Freq: Four times a day (QID) | ORAL | Status: DC | PRN

## 2016-01-15 NOTE — Patient Instructions (Addendum)
Please continue the same medications.  Please come back for a regular physical appointment in 3 months.   

## 2016-01-15 NOTE — Progress Notes (Signed)
Subjective:    Patient ID: Larry Robertson, male    DOB: 03-Dec-1940, 75 y.o.   MRN: [id number]  HPI Pt returns for f/u of diabetes mellitus: DM type: 2 Dx'ed: [id number] Complications: none Therapy: 2 oral meds DKA: never Severe hypoglycemia: never Pancreatitis: never Other: edema limits oral rx options Interval history: pt states he feels well in general.  He does not check cbg's.   Past Medical History  Diagnosis Date  . HEPATITIS C 08/25/2007  . DIABETES MELLITUS, TYPE II 08/25/2007  . HYPOGONADISM 09/27/2009  . 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  . 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 ERECTILE DYSFUNCTION 08/25/2007  . HYPERTENSION 08/25/2007  . Diastolic dysfunction     on echo  . 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 Arthritis   . Diverticulosis   . Colon polyps 05/19/2012    Past Surgical History  Procedure Laterality Date  . Lumbar fusion  2005  . Tonsillectomy and adenoidectomy    . Cholecystectomy  1999  . Abdominal hernia repair  2000  . Colonoscopy      Social History   Social History  . Marital Status: Single    Spouse Name: N/A  . Number of Children: 7  . Years of Education: N/A   Occupational History  . 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 Smoking status: Never Smoker   . Smokeless tobacco: Never Used  . Alcohol Use: Yes     Comment: 3-4 beers per week  . Drug Use: No  . Sexual Activity: Not on file   Other Topics Concern  . Not on file   Social History Narrative   Divorced    Current Outpatient Prescriptions on File Prior to Visit  Medication Sig Dispense Refill  . allopurinol (ZYLOPRIM) 300 MG tablet Take 300 mg by mouth daily.      Marland Kitchen amLODipine (NORVASC) 10 MG tablet TAKE 1 TABLET BY MOUTH EVERY DAY 90 tablet 0  . aspirin 325 MG tablet Take 325 mg by mouth daily.      . colchicine 0.6 MG tablet Take 0.6 mg by mouth. 1 tablet every hour as needed for gout, not to exceed 6/day    . furosemide  (LASIX) 20 MG tablet TAKE 1 TABLET (20 MG TOTAL) BY MOUTH DAILY. 90 tablet 3  . losartan (COZAAR) 100 MG tablet Take 1 tablet (100 mg total) by mouth daily. 90 tablet 3  . metFORMIN (GLUCOPHAGE) 1000 MG tablet TAKE 1 TABLET BY MOUTH 2 TIMES DAILY WITH A MEAL. 180 tablet 1  . omeprazole (PRILOSEC) 20 MG capsule Take 1 capsule (20 mg total) by mouth daily. 90 capsule 3  . sitaGLIPtin (JANUVIA) 100 MG tablet Take 1 tablet (100 mg total) by mouth daily. 30 tablet 11  . desmopressin (DDAVP) 0.1 MG tablet Take 100 mcg by mouth daily. Reported on 01/15/2016  11  . methocarbamol (ROBAXIN) 500 MG tablet Take 1 tablet (500 mg total) by mouth at bedtime as needed for muscle spasms. (Patient not taking: Reported on 01/15/2016) 30 tablet 5   No current facility-administered medications on file prior to visit.    No Known Allergies  Family History  Problem Relation Age of Onset  . Cancer Neg Hx   . Colon cancer Neg Hx   . Esophageal cancer Neg Hx   . Rectal cancer Neg Hx   . Stomach cancer Neg Hx   . Diabetes Maternal Grandmother     BP  128/88 mmHg  Pulse 78  Temp(Src) 97.5 F (36.4 C) (Oral)  Ht 5\' 11"  (1.803 m)  Wt 215 lb (97.523 kg)  BMI 30.00 kg/m2  SpO2 93%  Review of Systems No weight change    Objective:   Physical Exam VITAL SIGNS:  See vs page GENERAL: no distress PSYCH: Alert and well-oriented.  Does not appear anxious nor depressed.    A1c=6.3%    Assessment & Plan:  DM: well-controlled  Patient is advised the following: Patient Instructions  Please continue the same medications. Please come back for a regular physical appointment in 3 months.

## 2016-02-08 ENCOUNTER — Telehealth: Payer: Self-pay | Admitting: Endocrinology

## 2016-02-08 DIAGNOSIS — M5410 Radiculopathy, site unspecified: Secondary | ICD-10-CM

## 2016-02-08 MED ORDER — OXYCODONE-ACETAMINOPHEN 10-325 MG PO TABS: 1.0000 | ORAL_TABLET | Freq: Four times a day (QID) | ORAL | Status: DC | PRN

## 2016-02-08 NOTE — Telephone Encounter (Signed)
Pt called and said he needs a prescription refill of his pain medication

## 2016-02-08 NOTE — Telephone Encounter (Signed)
i printed 

## 2016-02-08 NOTE — Telephone Encounter (Signed)
See note below

## 2016-02-08 NOTE — Telephone Encounter (Signed)
I contacted the pt and advised Rx is ready for pick up. Rx placed up front.

## 2016-03-01 ENCOUNTER — Other Ambulatory Visit: Payer: Self-pay | Admitting: Endocrinology

## 2016-03-10 ENCOUNTER — Telehealth: Payer: Self-pay | Admitting: Endocrinology

## 2016-03-10 DIAGNOSIS — M5410 Radiculopathy, site unspecified: Secondary | ICD-10-CM

## 2016-03-10 NOTE — Telephone Encounter (Signed)
We can send the same prescription again

## 2016-03-10 NOTE — Telephone Encounter (Signed)
What does he take this for and when was the last refill?

## 2016-03-10 NOTE — Telephone Encounter (Signed)
Megan, please read message below.

## 2016-03-10 NOTE — Telephone Encounter (Signed)
I reviewed the chart and located the Oxycodone was last refilled on 02/08/2016 and is associated with the dx of Radiculopath of the leg.

## 2016-03-10 NOTE — Telephone Encounter (Signed)
Please read message below and advise.  

## 2016-03-10 NOTE — Telephone Encounter (Signed)
Patient need a refill of percocet.

## 2016-03-10 NOTE — Telephone Encounter (Signed)
Megan, please review and advise per Dr Ronnie Derby questions. Thank you.

## 2016-03-11 MED ORDER — OXYCODONE-ACETAMINOPHEN 10-325 MG PO TABS: 1.0000 | ORAL_TABLET | Freq: Four times a day (QID) | ORAL | Status: DC | PRN

## 2016-03-11 NOTE — Telephone Encounter (Signed)
I contacted the pt and advised Rx is ready for pick up. Rx placed up front.

## 2016-04-09 ENCOUNTER — Telehealth: Payer: Self-pay | Admitting: Endocrinology

## 2016-04-09 DIAGNOSIS — M5410 Radiculopathy, site unspecified: Secondary | ICD-10-CM

## 2016-04-09 MED ORDER — OXYCODONE-ACETAMINOPHEN 10-325 MG PO TABS: 1.0000 | ORAL_TABLET | Freq: Four times a day (QID) | ORAL | Status: DC | PRN

## 2016-04-09 NOTE — Telephone Encounter (Signed)
PT called and said he needs his pain medication refilled

## 2016-04-09 NOTE — Telephone Encounter (Signed)
I contacted the pt and advised rx is ready for pick up. Rx placed up front.  

## 2016-04-09 NOTE — Telephone Encounter (Signed)
i printed 

## 2016-04-09 NOTE — Telephone Encounter (Signed)
See note below   Thanks

## 2016-04-10 ENCOUNTER — Other Ambulatory Visit: Payer: Self-pay | Admitting: Gastroenterology

## 2016-04-14 ENCOUNTER — Encounter: Payer: Self-pay | Admitting: Endocrinology

## 2016-04-14 ENCOUNTER — Ambulatory Visit (INDEPENDENT_AMBULATORY_CARE_PROVIDER_SITE_OTHER): Payer: Medicare Other | Admitting: Endocrinology

## 2016-04-14 ENCOUNTER — Other Ambulatory Visit: Payer: Self-pay | Admitting: Endocrinology

## 2016-04-14 VITALS — BP 136/87 | HR 83 | Temp 97.8°F | Ht 71.0 in | Wt 217.0 lb

## 2016-04-14 DIAGNOSIS — M1009 Idiopathic gout, multiple sites: Secondary | ICD-10-CM

## 2016-04-14 DIAGNOSIS — E781 Pure hyperglyceridemia: Secondary | ICD-10-CM | POA: Diagnosis not present

## 2016-04-14 DIAGNOSIS — E119 Type 2 diabetes mellitus without complications: Secondary | ICD-10-CM | POA: Diagnosis not present

## 2016-04-14 DIAGNOSIS — R609 Edema, unspecified: Secondary | ICD-10-CM

## 2016-04-14 DIAGNOSIS — Z125 Encounter for screening for malignant neoplasm of prostate: Secondary | ICD-10-CM

## 2016-04-14 DIAGNOSIS — R351 Nocturia: Secondary | ICD-10-CM

## 2016-04-14 DIAGNOSIS — I10 Essential (primary) hypertension: Secondary | ICD-10-CM

## 2016-04-14 DIAGNOSIS — M109 Gout, unspecified: Secondary | ICD-10-CM

## 2016-04-14 LAB — BASIC METABOLIC PANEL
BUN: 12 mg/dL (ref 6–23)
CO2: 25 mEq/L (ref 19–32)
Calcium: 9.2 mg/dL (ref 8.4–10.5)
Chloride: 101 mEq/L (ref 96–112)
Creatinine, Ser: 0.84 mg/dL (ref 0.40–1.50)
GFR: 94.41 mL/min (ref >60.00)
Glucose, Bld: 297 mg/dL — ABNORMAL HIGH (ref 70–99)
Potassium: 3.8 mEq/L (ref 3.5–5.1)
Sodium: 136 mEq/L (ref 135–145)

## 2016-04-14 LAB — MICROALBUMIN / CREATININE URINE RATIO
Creatinine,U: 88.6 mg/dL
Microalb Creat Ratio: 2.4 mg/g (ref 0.0–30.0)
Microalb, Ur: 2.1 mg/dL — ABNORMAL HIGH (ref 0.0–1.9)

## 2016-04-14 LAB — CBC WITH DIFFERENTIAL/PLATELET
Basophils Absolute: 0 10*3/uL (ref 0.0–0.1)
Basophils Relative: 0.7 % (ref 0.0–3.0)
Eosinophils Absolute: 0.1 10*3/uL (ref 0.0–0.7)
Eosinophils Relative: 2 % (ref 0.0–5.0)
HCT: 41.6 % (ref 39.0–52.0)
Hemoglobin: 14.4 g/dL (ref 13.0–17.0)
Lymphocytes Relative: 21.5 % (ref 12.0–46.0)
Lymphs Abs: 1.2 10*3/uL (ref 0.7–4.0)
MCHC: 34.5 g/dL (ref 30.0–36.0)
MCV: 90.7 fl (ref 78.0–100.0)
Monocytes Absolute: 0.4 10*3/uL (ref 0.1–1.0)
Monocytes Relative: 6.8 % (ref 3.0–12.0)
Neutro Abs: 3.7 10*3/uL (ref 1.4–7.7)
Neutrophils Relative %: 69 % (ref 43.0–77.0)
Platelets: 136 10*3/uL — ABNORMAL LOW (ref 150.0–400.0)
RBC: 4.59 Mil/uL (ref 4.22–5.81)
RDW: 12.8 % (ref 11.5–15.5)
WBC: 5.4 10*3/uL (ref 4.0–10.5)

## 2016-04-14 LAB — LIPID PANEL
Cholesterol: 151 mg/dL (ref 0–200)
HDL: 20.9 mg/dL — ABNORMAL LOW (ref >39.00)
Total CHOL/HDL Ratio: 7
Triglycerides: 460 mg/dL — ABNORMAL HIGH (ref 0.0–149.0)

## 2016-04-14 LAB — HEPATIC FUNCTION PANEL
ALT: 150 U/L — ABNORMAL HIGH (ref 0–53)
AST: 95 U/L — ABNORMAL HIGH (ref 0–37)
Albumin: 4 g/dL (ref 3.5–5.2)
Alkaline Phosphatase: 86 U/L (ref 39–117)
Bilirubin, Direct: 0.6 mg/dL — ABNORMAL HIGH (ref 0.0–0.3)
Total Bilirubin: 1.2 mg/dL (ref 0.2–1.2)
Total Protein: 6.8 g/dL (ref 6.0–8.3)

## 2016-04-14 LAB — TSH: TSH: 1.71 u[IU]/mL (ref 0.35–4.50)

## 2016-04-14 LAB — URIC ACID: Uric Acid, Serum: 5.4 mg/dL (ref 4.0–7.8)

## 2016-04-14 LAB — LDL CHOLESTEROL, DIRECT: Direct LDL: 45 mg/dL

## 2016-04-14 LAB — PSA: PSA: 1.81 ng/mL (ref 0.10–4.00)

## 2016-04-14 LAB — POCT GLYCOSYLATED HEMOGLOBIN (HGB A1C): Hemoglobin A1C: 7.4

## 2016-04-14 MED ORDER — BROMOCRIPTINE MESYLATE 2.5 MG PO TABS: ORAL_TABLET | ORAL | Status: DC

## 2016-04-14 NOTE — Progress Notes (Signed)
Subjective:    Patient ID: Larry Robertson, male    DOB: 1941-01-28, 75 y.o.   MRN: [id number]  HPI Pt is here for regular wellness examination, and is feeling pretty well in general, and says chronic med probs are stable, except as noted below. Past Medical History  Diagnosis Date  . HEPATITIS C 08/25/2007  . DIABETES MELLITUS, TYPE II 08/25/2007  . HYPOGONADISM 09/27/2009  . 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  . 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 ERECTILE DYSFUNCTION 08/25/2007  . HYPERTENSION 08/25/2007  . Diastolic dysfunction     on echo  . 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 Arthritis   . Diverticulosis   . Colon polyps 05/19/2012    Past Surgical History  Procedure Laterality Date  . Lumbar fusion  2005  . Tonsillectomy and adenoidectomy    . Cholecystectomy  1999  . Abdominal hernia repair  2000  . Colonoscopy      Social History   Social History  . Marital Status: Single    Spouse Name: N/A  . Number of Children: 7  . Years of Education: N/A   Occupational History  . 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 Smoking status: Never Smoker   . Smokeless tobacco: Never Used  . Alcohol Use: Yes     Comment: 3-4 beers per week  . Drug Use: No  . Sexual Activity: Not on file   Other Topics Concern  . Not on file   Social History Narrative   Divorced    Current Outpatient Prescriptions on File Prior to Visit  Medication Sig Dispense Refill  . allopurinol (ZYLOPRIM) 300 MG tablet Take 300 mg by mouth daily.      Marland Kitchen amLODipine (NORVASC) 10 MG tablet TAKE 1 TABLET BY MOUTH EVERY DAY 90 tablet 0  . aspirin 325 MG tablet Take 325 mg by mouth daily.      . colchicine 0.6 MG tablet Take 0.6 mg by mouth. 1 tablet every hour as needed for gout, not to exceed 6/day    . furosemide (LASIX) 20 MG tablet TAKE 1 TABLET (20 MG TOTAL) BY MOUTH DAILY. 90 tablet 3  . metFORMIN (GLUCOPHAGE) 1000 MG tablet TAKE 1 TABLET BY MOUTH 2  TIMES DAILY WITH A MEAL. 180 tablet 1  . omeprazole (PRILOSEC) 20 MG capsule Take 1 capsule (20 mg total) by mouth daily. 90 capsule 3  . oxyCODONE-acetaminophen (PERCOCET) 10-325 MG tablet Take 1 tablet by mouth every 6 (six) hours as needed. For pain 120 tablet 0  . sitaGLIPtin (JANUVIA) 100 MG tablet Take 1 tablet (100 mg total) by mouth daily. 30 tablet 11   No current facility-administered medications on file prior to visit.    No Known Allergies  Family History  Problem Relation Age of Onset  . Cancer Neg Hx   . Colon cancer Neg Hx   . Esophageal cancer Neg Hx   . Rectal cancer Neg Hx   . Stomach cancer Neg Hx   . Diabetes Maternal Grandmother     BP 136/87 mmHg  Pulse 83  Temp(Src) 97.8 F (36.6 C) (Oral)  Ht 5\' 11"  (1.803 m)  Wt 217 lb (98.431 kg)  BMI 30.28 kg/m2  SpO2 91%   Review of Systems  Constitutional: Negative for fever.  HENT: Negative for hearing loss.   Eyes: Negative for visual disturbance.  Respiratory: Negative for shortness of breath.   Cardiovascular: Negative for chest pain.  Gastrointestinal: Negative for blood in stool.  Endocrine: Negative for cold intolerance.  Genitourinary: Negative for hematuria and difficulty urinating.  Musculoskeletal:       No change in chronic low-back pain  Skin: Positive for rash.  Allergic/Immunologic: Negative for environmental allergies.  Neurological: Negative for syncope.  Hematological: Does not bruise/bleed easily.  Psychiatric/Behavioral: Negative for dysphoric mood.       Objective:   Physical Exam VS: see vs page GEN: no distress HEAD: head: no deformity eyes: no periorbital swelling, no proptosis external nose and ears are normal mouth: no lesion seen NECK: supple, thyroid is not enlarged CHEST WALL: no deformity LUNGS: clear to auscultation, except fopr decreased BS at the left base BREASTS:  No gynecomastia CV: reg rate and rhythm, no murmur ABD: abdomen is soft, nontender.  no  hepatosplenomegaly.  not distended.  Self-reducing ventral hernia RECTAL/PROSTATE: declined MUSCULOSKELETAL: muscle bulk and strength are grossly normal.  no obvious joint swelling.  gait is normal and steady EXTEMITIES: no deformity.  no ulcer on the feet.  feet are of normal color and temp.   PULSES: no carotid bruit NEURO:  cn 2-12 grossly intact.   readily moves all 4's.  SKIN:  Normal texture and temperature.  No rash or suspicious lesion is visible.   NODES:  None palpable at the neck PSYCH: alert, well-oriented.  Does not appear anxious nor depressed.      Assessment & Plan:  Wellness visit today, with problems stable, except as noted.   SEPARATE EVALUATION FOLLOWS--EACH PROBLEM HERE IS NEW, NOT RESPONDING TO TREATMENT, OR POSES SIGNIFICANT RISK TO THE PATIENT'S HEALTH: HISTORY OF THE PRESENT ILLNESS: Pt returns for f/u of diabetes mellitus: DM type: 2 Dx'ed: [id number] Complications: none Therapy: 2 oral meds. DKA: never Severe hypoglycemia: never. Pancreatitis: never Other: edema and nocturia (he no longer takes d-DAVP), limit oral rx options; he has never taken insulin.  Interval history: pt states he feels well in general.  He does not check cbg's.  PAST MEDICAL HISTORY reviewed and up to date today REVIEW OF SYSTEMS: Denies weight change and numbness.   PHYSICAL EXAMINATION: VITAL SIGNS:  See vs page GENERAL: no distress Pulses: dorsalis pedis intact bilat.   MSK: no deformity of the feet CV: Trace bilat leg edema, and bilat varicosities Skin:  no ulcer on the feet.  normal color and temp on the feet. Neuro: sensation is intact to touch on the feet LAB/XRAY RESULTS: Lab Results  Component Value Date   HGBA1C 7.4 04/14/2016  IMPRESSION: DM: he needs increased rx, if it can be done with a regimen that avoids or minimizes hypoglycemia. Nocturia, off rx now: in this setting, we should avoid invokana Edema: this precludes pioglitizone rx PLAN:  i have sent a  prescription to your pharmacy, to add "bromocriptine," to help your blood sugar. It has possible side effects of nausea and dizziness.  These go away with time.  You can avoid these by taking it at bedtime at first.

## 2016-04-14 NOTE — Patient Instructions (Addendum)
i have sent a prescription to your pharmacy, to add "bromocriptine," to help your blood sugar. It has possible side effects of nausea and dizziness.  These go away with time.  You can avoid these by taking it at bedtime at first.   please consider these measures for your health:  minimize alcohol.  do not use tobacco products.  have a colonoscopy at least every 10 years from age 75.  keep firearms safely stored.  always use seat belts.  have working smoke alarms in your home.  see an eye doctor and dentist regularly.  never drive under the influence of alcohol or drugs (including prescription drugs).  those with fair skin should take precautions against the sun.  blood tests are requested for you today.  We'll let you know about the results. Please come back for a follow-up appointment in 6 months.

## 2016-04-16 ENCOUNTER — Other Ambulatory Visit: Payer: Self-pay

## 2016-04-16 MED ORDER — LOSARTAN POTASSIUM 100 MG PO TABS: ORAL_TABLET | ORAL | Status: DC

## 2016-05-07 ENCOUNTER — Telehealth: Payer: Self-pay | Admitting: Endocrinology

## 2016-05-07 DIAGNOSIS — M5410 Radiculopathy, site unspecified: Secondary | ICD-10-CM

## 2016-05-07 MED ORDER — OXYCODONE-ACETAMINOPHEN 10-325 MG PO TABS: 1.0000 | ORAL_TABLET | Freq: Four times a day (QID) | ORAL | Status: DC | PRN

## 2016-05-07 NOTE — Telephone Encounter (Signed)
I contacted the pt and advised rx is ready for pick up. Rx placed up front.  

## 2016-05-07 NOTE — Telephone Encounter (Signed)
Pt needs pain med refill  °

## 2016-05-07 NOTE — Telephone Encounter (Signed)
i printed 

## 2016-05-19 ENCOUNTER — Other Ambulatory Visit: Payer: Self-pay | Admitting: Endocrinology

## 2016-05-20 ENCOUNTER — Encounter: Payer: Self-pay | Admitting: Endocrinology

## 2016-05-31 ENCOUNTER — Other Ambulatory Visit: Payer: Self-pay | Admitting: Endocrinology

## 2016-06-05 ENCOUNTER — Telehealth: Payer: Self-pay | Admitting: Endocrinology

## 2016-06-05 DIAGNOSIS — M5410 Radiculopathy, site unspecified: Secondary | ICD-10-CM

## 2016-06-05 MED ORDER — OXYCODONE-ACETAMINOPHEN 10-325 MG PO TABS
1.0000 | ORAL_TABLET | Freq: Four times a day (QID) | ORAL | Status: DC | PRN
Start: 2016-06-05 — End: 2016-07-07

## 2016-06-05 NOTE — Addendum Note (Signed)
Addended by: Renato Shin on: 06/05/2016 10:26 AM   Modules accepted: Orders

## 2016-06-05 NOTE — Telephone Encounter (Signed)
i printed 

## 2016-06-05 NOTE — Telephone Encounter (Signed)
I contacted the pt and advised Rx is ready for pick up. Rx placed up front for pt to come and pick up.

## 2016-06-05 NOTE — Telephone Encounter (Signed)
Pt needs pain med refills

## 2016-06-16 ENCOUNTER — Other Ambulatory Visit: Payer: Self-pay | Admitting: Endocrinology

## 2016-07-07 ENCOUNTER — Other Ambulatory Visit: Payer: Self-pay

## 2016-07-07 ENCOUNTER — Telehealth: Payer: Self-pay | Admitting: Endocrinology

## 2016-07-07 DIAGNOSIS — M5410 Radiculopathy, site unspecified: Secondary | ICD-10-CM

## 2016-07-07 MED ORDER — OXYCODONE-ACETAMINOPHEN 10-325 MG PO TABS: 1.0000 | ORAL_TABLET | Freq: Four times a day (QID) | ORAL | 0 refills | Status: DC | PRN

## 2016-07-07 NOTE — Telephone Encounter (Signed)
Need to know if he has had a drug screen in the last year, what is the prescription for and when his last prescription was

## 2016-07-07 NOTE — Telephone Encounter (Signed)
Pt advised rx is ready for pick. Pt advised we have written for a 15 day supply. Pt voiced understanding.

## 2016-07-07 NOTE — Telephone Encounter (Signed)
Last drug screen was 05/05/2014, pt is currently taking the rx for back pain and last refill was 06/05/2016. Thanks!

## 2016-07-07 NOTE — Telephone Encounter (Signed)
See below. Would you be will to refill during Dr. Cordelia Pen absence? Thanks!

## 2016-07-07 NOTE — Telephone Encounter (Signed)
Need refill of oxyCODONE-acetaminophen (PERCOCET) 10-325 MG tablet call when ready.

## 2016-07-07 NOTE — Telephone Encounter (Signed)
May refill for 15 days, recommend follow-up drug screen when he comes in

## 2016-07-11 NOTE — Telephone Encounter (Signed)
Patient need a refill of medication oxyCODONE-acetaminophen (PERCOCET) 10-325 MG tablet

## 2016-07-11 NOTE — Telephone Encounter (Signed)
I contacted the pt and advised we could not give another refill until Dr. Loanne Drilling returned on 07/14/2016. Pt voiced understanding on this.

## 2016-07-14 MED ORDER — OXYCODONE-ACETAMINOPHEN 10-325 MG PO TABS: 1.0000 | ORAL_TABLET | Freq: Four times a day (QID) | ORAL | 0 refills | Status: DC | PRN

## 2016-07-14 NOTE — Telephone Encounter (Signed)
I printed  

## 2016-07-14 NOTE — Telephone Encounter (Signed)
PT requests phone call once script is complete.

## 2016-07-14 NOTE — Telephone Encounter (Signed)
See note below and please advise if ok to refill the pt's pain medication. Thanks!

## 2016-07-15 NOTE — Telephone Encounter (Signed)
Pt notified rx is ready. Rx placed up front for pt to pick up.

## 2016-08-06 ENCOUNTER — Other Ambulatory Visit: Payer: Self-pay

## 2016-08-06 MED ORDER — SITAGLIPTIN PHOSPHATE 100 MG PO TABS: 100.0000 mg | ORAL_TABLET | Freq: Every day | ORAL | 6 refills | Status: DC

## 2016-08-13 ENCOUNTER — Telehealth: Payer: Self-pay | Admitting: Endocrinology

## 2016-08-13 DIAGNOSIS — M5410 Radiculopathy, site unspecified: Secondary | ICD-10-CM

## 2016-08-13 MED ORDER — OXYCODONE-ACETAMINOPHEN 10-325 MG PO TABS: 1.0000 | ORAL_TABLET | Freq: Four times a day (QID) | ORAL | 0 refills | Status: DC | PRN

## 2016-08-13 NOTE — Telephone Encounter (Signed)
I contacted the patient and advised refill is ready pick up. Refill placed upfront for the patient.

## 2016-08-13 NOTE — Telephone Encounter (Signed)
Patient called and said he needs his refill on his pain medication.

## 2016-08-13 NOTE — Telephone Encounter (Signed)
I printed  

## 2016-08-13 NOTE — Telephone Encounter (Signed)
See message and please advise, Thanks!  

## 2016-09-09 DIAGNOSIS — H25813 Combined forms of age-related cataract, bilateral: Secondary | ICD-10-CM | POA: Diagnosis not present

## 2016-09-09 DIAGNOSIS — H43812 Vitreous degeneration, left eye: Secondary | ICD-10-CM | POA: Diagnosis not present

## 2016-09-09 DIAGNOSIS — E119 Type 2 diabetes mellitus without complications: Secondary | ICD-10-CM | POA: Diagnosis not present

## 2016-09-10 ENCOUNTER — Telehealth: Payer: Self-pay | Admitting: Endocrinology

## 2016-09-10 DIAGNOSIS — M5410 Radiculopathy, site unspecified: Secondary | ICD-10-CM

## 2016-09-10 NOTE — Telephone Encounter (Signed)
Patient need a refill of his pain meds oxyCODONE-acetaminophen (PERCOCET) 10-325 MG tablet.

## 2016-09-11 NOTE — Telephone Encounter (Signed)
Pt needs his percocet please

## 2016-09-12 MED ORDER — OXYCODONE-ACETAMINOPHEN 10-325 MG PO TABS: 1.0000 | ORAL_TABLET | Freq: Four times a day (QID) | ORAL | 0 refills | Status: DC | PRN

## 2016-09-12 NOTE — Telephone Encounter (Signed)
I contacted the patient and advised his Refill is ready for pick up. Rx placed up front for the patient to pick up.

## 2016-09-12 NOTE — Telephone Encounter (Signed)
I printed  

## 2016-10-15 ENCOUNTER — Encounter: Payer: Self-pay | Admitting: Endocrinology

## 2016-10-15 ENCOUNTER — Telehealth: Payer: Self-pay | Admitting: Endocrinology

## 2016-10-15 ENCOUNTER — Ambulatory Visit (INDEPENDENT_AMBULATORY_CARE_PROVIDER_SITE_OTHER): Payer: Medicare Other | Admitting: Endocrinology

## 2016-10-15 VITALS — BP 146/84 | HR 74 | Ht 71.0 in | Wt 210.0 lb

## 2016-10-15 DIAGNOSIS — Z23 Encounter for immunization: Secondary | ICD-10-CM

## 2016-10-15 DIAGNOSIS — E119 Type 2 diabetes mellitus without complications: Secondary | ICD-10-CM

## 2016-10-15 DIAGNOSIS — D696 Thrombocytopenia, unspecified: Secondary | ICD-10-CM

## 2016-10-15 DIAGNOSIS — B1710 Acute hepatitis C without hepatic coma: Secondary | ICD-10-CM

## 2016-10-15 LAB — CBC WITH DIFFERENTIAL/PLATELET
Basophils Absolute: 0 10*3/uL (ref 0.0–0.1)
Basophils Relative: 1 % (ref 0.0–3.0)
Eosinophils Absolute: 0.1 10*3/uL (ref 0.0–0.7)
Eosinophils Relative: 2.1 % (ref 0.0–5.0)
HCT: 40.1 % (ref 39.0–52.0)
Hemoglobin: 14 g/dL (ref 13.0–17.0)
Lymphocytes Relative: 27.4 % (ref 12.0–46.0)
Lymphs Abs: 1.1 10*3/uL (ref 0.7–4.0)
MCHC: 34.9 g/dL (ref 30.0–36.0)
MCV: 90.9 fl (ref 78.0–100.0)
Monocytes Absolute: 0.3 10*3/uL (ref 0.1–1.0)
Monocytes Relative: 7.7 % (ref 3.0–12.0)
Neutro Abs: 2.6 10*3/uL (ref 1.4–7.7)
Neutrophils Relative %: 61.8 % (ref 43.0–77.0)
Platelets: 121 10*3/uL — ABNORMAL LOW (ref 150.0–400.0)
RBC: 4.41 Mil/uL (ref 4.22–5.81)
RDW: 13 % (ref 11.5–15.5)
WBC: 4.2 10*3/uL (ref 4.0–10.5)

## 2016-10-15 LAB — POCT GLYCOSYLATED HEMOGLOBIN (HGB A1C): Hemoglobin A1C: 7.8

## 2016-10-15 MED ORDER — OXYCODONE-ACETAMINOPHEN 5-325 MG PO TABS: 1.0000 | ORAL_TABLET | Freq: Four times a day (QID) | ORAL | 0 refills | Status: DC | PRN

## 2016-10-15 MED ORDER — REPAGLINIDE 0.5 MG PO TABS: 0.5000 mg | ORAL_TABLET | Freq: Three times a day (TID) | ORAL | 11 refills | Status: DC

## 2016-10-15 MED ORDER — GABAPENTIN 300 MG PO CAPS: 300.0000 mg | ORAL_CAPSULE | Freq: Two times a day (BID) | ORAL | 11 refills | Status: DC

## 2016-10-15 NOTE — Telephone Encounter (Signed)
Patient stated he wanted the Gabapentin 10 mg it is stronger than the 5 mg.

## 2016-10-15 NOTE — Telephone Encounter (Signed)
Please try the combination of the 2 meds.  This works better than 1 alone, and is safer.

## 2016-10-15 NOTE — Progress Notes (Signed)
we discussed code status.  pt requests full code, but would not want to be started or maintained on artificial life-support measures if there was not a reasonable chance of recovery 

## 2016-10-15 NOTE — Telephone Encounter (Signed)
See message and please advise, Thanks!  

## 2016-10-15 NOTE — Patient Instructions (Addendum)
blood tests are requested for you today.  We'll let you know about the results. I have sent a prescription to your pharmacy, to add "repaglinde."  I have also sent a prescription to your pharmacy, to add "gabapentin."  This will help you pain.  Please see a liver specialist.  you will receive a phone call, about a day and time for an appointment. Please consider these measures for your health:  minimize alcohol.  Do not use tobacco products.  Have a colonoscopy at least every 10 years from age 72.  Keep firearms safely stored.  Always use seat belts.  have working smoke alarms in your home.  See an eye doctor and dentist regularly.  Never drive under the influence of alcohol or drugs (including prescription drugs).  Those with fair skin should take precautions against the sun, and should carefully examine their skin once per month, for any new or changed moles. It is critically important to prevent falling down (keep floor areas well-lit, dry, and free of loose objects.  If you have a cane, walker, or wheelchair, you should use it, even for short trips around the house.  Wear flat-soled shoes.  Also, try not to rush) Please come back for a follow-up appointment in 3 months.

## 2016-10-15 NOTE — Telephone Encounter (Signed)
The 5 mg+gabapentin is stronger than the 10 alone, so this is an increase.  I hope you will also require it less often.

## 2016-10-15 NOTE — Progress Notes (Signed)
Subjective:    Patient ID: Larry Robertson, male    DOB: 1941/07/25, 75 y.o.   MRN: [id number]  HPI  The state of at least three ongoing medical problems is addressed today, with interval history of each noted here: Pt returns for f/u of diabetes mellitus:  DM type: 2 Dx'ed: 2010.  Complications: none Therapy: 3 oral meds. DKA: never Severe hypoglycemia: never. Pancreatitis: never Other: edema and nocturia (he no longer takes d-DAVP), limit oral rx options; he has never taken insulin.  Interval history: pt states he feels well in general.  He does not check cbg's.  Pt has thrombocytopenia, Hep-C, and chronic low back and neck pain (which persists).   Past Medical History:  Diagnosis Date  . Arthritis   . Colon polyps 05/19/2012  . 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 DIABETES MELLITUS, TYPE II 08/25/2007  . Diastolic dysfunction    on echo  . Diverticulosis   . ERECTILE DYSFUNCTION 08/25/2007  . 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 HEPATITIS C 08/25/2007  . HYPERTENSION 08/25/2007  . 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  . HYPOGONADISM 09/27/2009    Past Surgical History:  Procedure Laterality Date  . ABDOMINAL HERNIA REPAIR  2000  . CHOLECYSTECTOMY  1999  . COLONOSCOPY    . LUMBAR FUSION  2005  . TONSILLECTOMY AND ADENOIDECTOMY      Social History   Social History  . Marital status: Single    Spouse name: N/A  . Number of children: 7  . Years of education: N/A   Occupational History  . 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 Smoking status: Never Smoker  . Smokeless tobacco: Never Used  . Alcohol use Yes     Comment: 3-4 beers per week  . Drug use: No  . Sexual activity: Not on file   Other Topics Concern  . Not on file   Social History Narrative   Divorced    Current Outpatient Prescriptions on File Prior to Visit  Medication Sig Dispense Refill  . allopurinol (ZYLOPRIM) 300 MG tablet Take 300 mg by mouth  daily.      Marland Kitchen amLODipine (NORVASC) 10 MG tablet TAKE 1 TABLET BY MOUTH EVERY DAY 90 tablet 1  . aspirin 325 MG tablet Take 325 mg by mouth daily.      . bromocriptine (PARLODEL) 2.5 MG tablet 1/4 tab daily 8 tablet 11  . colchicine 0.6 MG tablet Take 0.6 mg by mouth. 1 tablet every hour as needed for gout, not to exceed 6/day    . furosemide (LASIX) 20 MG tablet TAKE 1 TABLET (20 MG TOTAL) BY MOUTH DAILY. 90 tablet 3  . losartan (COZAAR) 100 MG tablet TAKE 1 TABLET (100 MG TOTAL) BY MOUTH DAILY. 90 tablet 3  . metFORMIN (GLUCOPHAGE) 1000 MG tablet TAKE 1 TABLET BY MOUTH 2 TIMES DAILY WITH A MEAL. 180 tablet 1  . omeprazole (PRILOSEC) 20 MG capsule Take 1 capsule (20 mg total) by mouth daily. 90 capsule 3  . sitaGLIPtin (JANUVIA) 100 MG tablet Take 1 tablet (100 mg total) by mouth daily. 30 tablet 6   No current facility-administered medications on file prior to visit.     No Known Allergies  Family History  Problem Relation Age of Onset  . Cancer Neg Hx   . Colon cancer Neg Hx   . Esophageal cancer Neg Hx   . Rectal cancer Neg Hx   . Stomach cancer Neg  Hx   . Diabetes Maternal Grandmother     BP (!) 146/84   Pulse 74   Ht 5\' 11"  (1.803 m)   Wt 210 lb (95.3 kg)   SpO2 95%   BMI 29.29 kg/m   Review of Systems Denies BRBPR and hematuria    Objective:   Physical Exam VITAL SIGNS:  See vs page GENERAL: no distress Pulses: dorsalis pedis intact bilat.   MSK: no deformity of the feet CV: Trace bilat leg edema, and bilat varicosities Skin:  no ulcer on the feet.  normal color and temp on the feet. 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 I reduced percocet from 10 to 5 mg.  chronic low back and neck pain, persistent.    I have sent a prescription to your pharmacy, to add "repaglinde."  I have also sent a prescription to your pharmacy, to add "gabapentin."  This will help you pain.  Please see a liver specialist.  you will receive a phone call, about a day and time for an appointment  Subjective:   Patient here for Medicare annual wellness visit and management of other chronic and acute problems.     Risk factors: advanced age    3 of Physicians Providing Medical Care to Patient:  See "snapshot"   Activities of Daily Living: In your present state of health, do you have any difficulty performing the following activities (lives alone)?:  Preparing food and eating?: No  Bathing yourself: No  Getting dressed: No  Using the toilet:No  Moving around from place to place: No  In the past year have you fallen or had a near fall?:No    Home Safety: Has smoke detector and wears seat belts. No firearms. No excess sun exposure.  Diet and Exercise  Current exercise habits: pt says good Dietary issues discussed: pt reports a healthy diet   Depression Screen  Q1: Over the past two weeks, have you felt down, depressed or hopeless? no  Q2: Over the past two weeks, have you felt little interest or pleasure in doing things? no   The following portions of the patient's history were reviewed and updated as appropriate: allergies, current medications, past family history, past medical history, past social history, past surgical history and problem list.   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  Cognitive Impairment Assessment: cognition, memory and judgment appear normal.  remembers 2/3 at 5 minutes (?  effort).  excellent recall.  can easily read and write a sentence.  alert and oriented x 3.    Assessment:   Medicare wellness utd on preventive parameters    Plan:   During the course of the visit the patient was educated and counseled about appropriate screening and preventive services including:        Fall prevention   Diabetes screening  Nutrition counseling   Vaccines /  LABS Zostavax / Pneumococcal Vaccine  today  PSA  Patient Instructions (the written plan) was given to the patient.

## 2016-10-15 NOTE — Telephone Encounter (Signed)
Pt is asking for the percocet to be fixed please we wrote it for # 5 not # 10

## 2016-10-16 NOTE — Telephone Encounter (Signed)
I contacted the patient and advised of message. Patient verbalized understanding and stated he would try the 5-325 mg of Percocet and 30 mg of gabapentin. Patient stated if the pain was not controlled with this regimen he would call and let us know.

## 2016-10-29 ENCOUNTER — Ambulatory Visit (INDEPENDENT_AMBULATORY_CARE_PROVIDER_SITE_OTHER): Payer: Medicare Other | Admitting: Endocrinology

## 2016-10-29 ENCOUNTER — Encounter: Payer: Self-pay | Admitting: Endocrinology

## 2016-10-29 ENCOUNTER — Other Ambulatory Visit (HOSPITAL_COMMUNITY): Payer: Self-pay | Admitting: Nurse Practitioner

## 2016-10-29 VITALS — BP 132/82 | HR 81 | Wt 210.0 lb

## 2016-10-29 DIAGNOSIS — M545 Low back pain, unspecified: Secondary | ICD-10-CM

## 2016-10-29 DIAGNOSIS — B182 Chronic viral hepatitis C: Secondary | ICD-10-CM

## 2016-10-29 MED ORDER — OXYCODONE-ACETAMINOPHEN 10-325 MG PO TABS: 1.0000 | ORAL_TABLET | ORAL | 0 refills | Status: DC | PRN

## 2016-10-29 NOTE — Patient Instructions (Signed)
Please stay off the gabapentin, and: Here is a prescription to re-increase the oxycodone back to its previous strength. I'll see you next time.

## 2016-10-29 NOTE — Progress Notes (Signed)
Subjective:    Patient ID: Larry Robertson, male    DOB: December 12, 1940, 75 y.o.   MRN: [id number]  HPI Pt says he did not tolerate gabapentin (diplopia, slight rash of the face, and assoc fatigue).  He d/c'ed gabapentin, and it resolved.   Past Medical History:  Diagnosis Date  . Arthritis   . Colon polyps 05/19/2012  . 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 DIABETES MELLITUS, TYPE II 08/25/2007  . Diastolic dysfunction    on echo  . Diverticulosis   . ERECTILE DYSFUNCTION 08/25/2007  . 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 HEPATITIS C 08/25/2007  . HYPERTENSION 08/25/2007  . 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  . HYPOGONADISM 09/27/2009    Past Surgical History:  Procedure Laterality Date  . ABDOMINAL HERNIA REPAIR  2000  . CHOLECYSTECTOMY  1999  . COLONOSCOPY    . LUMBAR FUSION  2005  . TONSILLECTOMY AND ADENOIDECTOMY      Social History   Social History  . Marital status: Single    Spouse name: N/A  . Number of children: 7  . Years of education: N/A   Occupational History  . 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 Smoking status: Never Smoker  . Smokeless tobacco: Never Used  . Alcohol use Yes     Comment: 3-4 beers per week  . Drug use: No  . Sexual activity: Not on file   Other Topics Concern  . Not on file   Social History Narrative   Divorced    Current Outpatient Prescriptions on File Prior to Visit  Medication Sig Dispense Refill  . allopurinol (ZYLOPRIM) 300 MG tablet Take 300 mg by mouth daily.      Marland Kitchen amLODipine (NORVASC) 10 MG tablet TAKE 1 TABLET BY MOUTH EVERY DAY 90 tablet 1  . aspirin 325 MG tablet Take 325 mg by mouth daily.      . bromocriptine (PARLODEL) 2.5 MG tablet 1/4 tab daily 8 tablet 11  . colchicine 0.6 MG tablet Take 0.6 mg by mouth. 1 tablet every hour as needed for gout, not to exceed 6/day    . furosemide (LASIX) 20 MG tablet TAKE 1 TABLET (20 MG TOTAL) BY MOUTH DAILY. 90 tablet 3  .  losartan (COZAAR) 100 MG tablet TAKE 1 TABLET (100 MG TOTAL) BY MOUTH DAILY. 90 tablet 3  . metFORMIN (GLUCOPHAGE) 1000 MG tablet TAKE 1 TABLET BY MOUTH 2 TIMES DAILY WITH A MEAL. 180 tablet 1  . omeprazole (PRILOSEC) 20 MG capsule Take 1 capsule (20 mg total) by mouth daily. 90 capsule 3  . repaglinide (PRANDIN) 0.5 MG tablet Take 1 tablet (0.5 mg total) by mouth 3 (three) times daily before meals. 90 tablet 11  . sitaGLIPtin (JANUVIA) 100 MG tablet Take 1 tablet (100 mg total) by mouth daily. 30 tablet 6   No current facility-administered medications on file prior to visit.     Allergies  Allergen Reactions  . Gabapentin Rash    And diplopia    Family History  Problem Relation Age of Onset  . Cancer Neg Hx   . Colon cancer Neg Hx   . Esophageal cancer Neg Hx   . Rectal cancer Neg Hx   . Stomach cancer Neg Hx   . Diabetes Maternal Grandmother     BP 132/82   Pulse 81   Wt 210 lb (95.3 kg)   SpO2 94%   BMI 29.29 kg/m  Review of Systems Low pain continues to be severe.  Denies bowel or bladder retention.      Objective:   Physical Exam VITAL SIGNS:  See vs page.   GENERAL: no distress.    Spine: nontender.  Gait: normal and steady.        Assessment & Plan:  Chronic pain syndrome, persistent Paresthesias, apparently due to gabapentin. Patient is advised the following: Patient Instructions  Please stay off the gabapentin, and: Here is a prescription to re-increase the oxycodone back to its previous strength. I'll see you next time.

## 2016-11-09 ENCOUNTER — Other Ambulatory Visit: Payer: Self-pay | Admitting: Endocrinology

## 2016-11-12 ENCOUNTER — Ambulatory Visit (HOSPITAL_COMMUNITY)
Admission: RE | Admit: 2016-11-12 | Discharge: 2016-11-12 | Disposition: A | Discharge status: Home / Self Care | Payer: Medicare Other | Source: Ambulatory Visit | Attending: Nurse Practitioner | Admitting: Nurse Practitioner

## 2016-11-12 DIAGNOSIS — B182 Chronic viral hepatitis C: Secondary | ICD-10-CM

## 2016-11-19 ENCOUNTER — Other Ambulatory Visit: Payer: Self-pay | Admitting: Endocrinology

## 2016-11-25 ENCOUNTER — Ambulatory Visit (HOSPITAL_COMMUNITY)
Admission: RE | Admit: 2016-11-25 | Discharge: 2016-11-25 | Disposition: A | Discharge status: Home / Self Care | Payer: Medicare Other | Source: Ambulatory Visit | Attending: Nurse Practitioner | Admitting: Nurse Practitioner

## 2016-11-25 ENCOUNTER — Telehealth: Payer: Self-pay

## 2016-11-25 ENCOUNTER — Other Ambulatory Visit: Payer: Self-pay

## 2016-11-25 DIAGNOSIS — Z9049 Acquired absence of other specified parts of digestive tract: Secondary | ICD-10-CM | POA: Insufficient documentation

## 2016-11-25 DIAGNOSIS — B182 Chronic viral hepatitis C: Secondary | ICD-10-CM | POA: Insufficient documentation

## 2016-11-25 MED ORDER — OXYCODONE-ACETAMINOPHEN 10-325 MG PO TABS: 1.0000 | ORAL_TABLET | ORAL | 0 refills | Status: DC | PRN

## 2016-11-25 NOTE — Telephone Encounter (Signed)
I contacted the patient and advised we have refill the oxycodone, but the refill was postdated for 11/28/2016. Patient verbalized understanding and had no further questions at this time.

## 2016-11-25 NOTE — Telephone Encounter (Signed)
Patient called and requested a refill on his Oxycodone. Would you be ok with refilling while Dr. Loanne Drilling is out of town? Thanks!

## 2016-11-25 NOTE — Telephone Encounter (Signed)
OK but needs to start 11/28/2016.

## 2016-11-25 NOTE — Addendum Note (Signed)
Addended by: Verlin Grills T on: 11/25/2016 12:24 PM   Modules accepted: Orders

## 2016-11-25 NOTE — Telephone Encounter (Signed)
RX placed up front for the patient to pick up.

## 2016-12-09 ENCOUNTER — Other Ambulatory Visit: Payer: Self-pay

## 2016-12-09 MED ORDER — REPAGLINIDE 0.5 MG PO TABS: 0.5000 mg | ORAL_TABLET | Freq: Three times a day (TID) | ORAL | 1 refills | Status: DC

## 2016-12-26 ENCOUNTER — Telehealth: Payer: Self-pay | Admitting: Endocrinology

## 2016-12-26 ENCOUNTER — Other Ambulatory Visit: Payer: Self-pay | Admitting: Endocrinology

## 2016-12-26 MED ORDER — OXYCODONE-ACETAMINOPHEN 10-325 MG PO TABS: 1.0000 | ORAL_TABLET | ORAL | 0 refills | Status: DC | PRN

## 2016-12-26 NOTE — Telephone Encounter (Signed)
Pt needs refill on his pain med please

## 2016-12-26 NOTE — Telephone Encounter (Signed)
I printed  

## 2016-12-26 NOTE — Telephone Encounter (Signed)
I contacted the patient and advised of message. Patient voiced understanding. Rx placed upfront for patient to pick up.

## 2017-01-05 ENCOUNTER — Telehealth: Payer: Self-pay | Admitting: Endocrinology

## 2017-01-05 NOTE — Telephone Encounter (Signed)
See message and please advise, Thanks!  

## 2017-01-05 NOTE — Telephone Encounter (Signed)
Pateint scheduled for 2:45 tomorrow.

## 2017-01-05 NOTE — Telephone Encounter (Signed)
Pt called and said he thinks he has the flu, he wants to know if he can just have something called in since he is feeling so bad.

## 2017-01-05 NOTE — Telephone Encounter (Signed)
It is important that we check this, so please advise ov

## 2017-01-06 ENCOUNTER — Encounter: Payer: Self-pay | Admitting: Endocrinology

## 2017-01-06 ENCOUNTER — Ambulatory Visit (INDEPENDENT_AMBULATORY_CARE_PROVIDER_SITE_OTHER): Payer: Medicare Other | Admitting: Endocrinology

## 2017-01-06 VITALS — BP 132/84 | HR 73 | Temp 98.8°F | Ht 71.0 in | Wt 209.0 lb

## 2017-01-06 DIAGNOSIS — E119 Type 2 diabetes mellitus without complications: Secondary | ICD-10-CM

## 2017-01-06 LAB — POCT GLYCOSYLATED HEMOGLOBIN (HGB A1C): Hemoglobin A1C: 7.3

## 2017-01-06 MED ORDER — OSELTAMIVIR PHOSPHATE 75 MG PO CAPS: 75.0000 mg | ORAL_CAPSULE | Freq: Two times a day (BID) | ORAL | 0 refills | Status: DC

## 2017-01-06 NOTE — Progress Notes (Signed)
Subjective:    Patient ID: Larry Robertson, male    DOB: 07/28/1941, 76 y.o.   MRN: [id number]  HPI Pt states 5 days of moderate myalgias throughout the body, and assoc headache.   Past Medical History:  Diagnosis Date  . Arthritis   . Colon polyps 05/19/2012  . 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 DIABETES MELLITUS, TYPE II 08/25/2007  . Diastolic dysfunction    on echo  . Diverticulosis   . ERECTILE DYSFUNCTION 08/25/2007  . 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 HEPATITIS C 08/25/2007  . HYPERTENSION 08/25/2007  . 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  . HYPOGONADISM 09/27/2009    Past Surgical History:  Procedure Laterality Date  . ABDOMINAL HERNIA REPAIR  2000  . CHOLECYSTECTOMY  1999  . COLONOSCOPY    . LUMBAR FUSION  2005  . TONSILLECTOMY AND ADENOIDECTOMY      Social History   Social History  . Marital status: Single    Spouse name: N/A  . Number of children: 7  . Years of education: N/A   Occupational History  . 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 Smoking status: Never Smoker  . Smokeless tobacco: Never Used  . Alcohol use Yes     Comment: 3-4 beers per week  . Drug use: No  . Sexual activity: Not on file   Other Topics Concern  . Not on file   Social History Narrative   Divorced    Current Outpatient Prescriptions on File Prior to Visit  Medication Sig Dispense Refill  . allopurinol (ZYLOPRIM) 300 MG tablet Take 300 mg by mouth daily.      Marland Kitchen amLODipine (NORVASC) 10 MG tablet TAKE 1 TABLET BY MOUTH EVERY DAY 90 tablet 1  . aspirin 325 MG tablet Take 325 mg by mouth daily.      . bromocriptine (PARLODEL) 2.5 MG tablet 1/4 tab daily 8 tablet 11  . colchicine 0.6 MG tablet Take 0.6 mg by mouth. 1 tablet every hour as needed for gout, not to exceed 6/day    . furosemide (LASIX) 20 MG tablet TAKE 1 TABLET (20 MG TOTAL) BY MOUTH DAILY. 90 tablet 3  . losartan (COZAAR) 100 MG tablet TAKE 1 TABLET (100 MG TOTAL)  BY MOUTH DAILY. 90 tablet 3  . metFORMIN (GLUCOPHAGE) 1000 MG tablet TAKE 1 TABLET BY MOUTH 2 TIMES DAILY WITH A MEAL. 180 tablet 1  . omeprazole (PRILOSEC) 20 MG capsule Take 1 capsule (20 mg total) by mouth daily. 90 capsule 3  . oxyCODONE-acetaminophen (PERCOCET) 10-325 MG tablet Take 1 tablet by mouth every 4 (four) hours as needed for pain. 120 tablet 0  . repaglinide (PRANDIN) 0.5 MG tablet Take 1 tablet (0.5 mg total) by mouth 3 (three) times daily before meals. 270 tablet 1  . sitaGLIPtin (JANUVIA) 100 MG tablet Take 1 tablet (100 mg total) by mouth daily. 30 tablet 6   No current facility-administered medications on file prior to visit.     Allergies  Allergen Reactions  . Gabapentin Rash    And diplopia    Family History  Problem Relation Age of Onset  . Cancer Neg Hx   . Colon cancer Neg Hx   . Esophageal cancer Neg Hx   . Rectal cancer Neg Hx   . Stomach cancer Neg Hx   . Diabetes Maternal Grandmother     BP 132/84   Pulse 73   Temp 98.8 F (37.1  C) (Oral)   Ht 5\' 11"  (1.803 m)   Wt 209 lb (94.8 kg)   SpO2 97%   BMI 29.15 kg/m    Review of Systems Denies cough, hypoglycemia, and fever.      Objective:   Physical Exam VITAL SIGNS:  See vs page GENERAL: no distress head: no deformity  eyes: no periorbital swelling, no proptosis  external nose and ears are normal  mouth: no lesion seen Both eac's and tm's are normal.    A1c=7.3%    Assessment & Plan:  Flu-like illness, new Type 2 DM: well-controlled.  Patient is advised the following: Patient Instructions  I have sent a prescription to your pharmacy, for a medication to help you feel better. I hope you feel better soon.  If you don't feel better by next week, please call back.  Please call sooner if you get worse.  Please continue the same medications for diabetes. Please come back for a follow-up appointment in 3 months

## 2017-01-06 NOTE — Patient Instructions (Addendum)
I have sent a prescription to your pharmacy, for a medication to help you feel better. I hope you feel better soon.  If you don't feel better by next week, please call back.  Please call sooner if you get worse.  Please continue the same medications for diabetes. Please come back for a follow-up appointment in 3 months

## 2017-01-15 ENCOUNTER — Ambulatory Visit: Payer: Self-pay | Admitting: Endocrinology

## 2017-01-21 DIAGNOSIS — K7469 Other cirrhosis of liver: Secondary | ICD-10-CM | POA: Diagnosis not present

## 2017-01-26 ENCOUNTER — Telehealth: Payer: Self-pay | Admitting: Endocrinology

## 2017-01-26 NOTE — Telephone Encounter (Signed)
Refill of   oxyCODONE-acetaminophen (PERCOCET) 10-325 MG tablet 120 tablet

## 2017-01-27 MED ORDER — OXYCODONE-ACETAMINOPHEN 10-325 MG PO TABS: 1.0000 | ORAL_TABLET | ORAL | 0 refills | Status: DC | PRN

## 2017-01-27 NOTE — Telephone Encounter (Signed)
Pt needs refill on oxycodone please °

## 2017-01-27 NOTE — Telephone Encounter (Signed)
I contacted the patient and advised Rx is ready pick up. Rx placed up front for the patient to pick up.

## 2017-01-27 NOTE — Telephone Encounter (Signed)
I printed  

## 2017-02-09 ENCOUNTER — Encounter (INDEPENDENT_AMBULATORY_CARE_PROVIDER_SITE_OTHER): Payer: Self-pay

## 2017-02-09 ENCOUNTER — Ambulatory Visit (INDEPENDENT_AMBULATORY_CARE_PROVIDER_SITE_OTHER): Payer: Medicare Other | Admitting: Nurse Practitioner

## 2017-02-09 ENCOUNTER — Encounter: Payer: Self-pay | Admitting: Nurse Practitioner

## 2017-02-09 VITALS — BP 118/76 | HR 76 | Wt 211.8 lb

## 2017-02-09 DIAGNOSIS — Z86010 Personal history of colonic polyps: Secondary | ICD-10-CM

## 2017-02-09 DIAGNOSIS — K7460 Unspecified cirrhosis of liver: Secondary | ICD-10-CM

## 2017-02-09 DIAGNOSIS — B1920 Unspecified viral hepatitis C without hepatic coma: Secondary | ICD-10-CM | POA: Diagnosis not present

## 2017-02-09 MED ORDER — NA SULFATE-K SULFATE-MG SULF 17.5-3.13-1.6 GM/177ML PO SOLN: ORAL | 0 refills | Status: DC

## 2017-02-09 NOTE — Progress Notes (Signed)
HPI:  Patient is a 76 year old male, previously followed by Dr. Deatra Ina. He has history of GERD and esophageal strictures requiring dilation. Last EGD was June 2015 at which time a moderate esophageal stricture was ballooned dilated.  No recent swallowing problems. He no longer takes omeprazole.   Patient also has has a HCV cirrhosis, followed by  Roosevelt Locks, NP at [facility] who has referred him for varices screening. 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 He is in the first week of a 12 week course of Epclusa. Patient has no medical or GI complaints.     Past Medical History:  Diagnosis Date  . Arthritis   . Colon polyps 05/19/2012  . 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 DIABETES MELLITUS, TYPE II 08/25/2007  . Diastolic dysfunction    on echo  . Diverticulosis   . ERECTILE DYSFUNCTION 08/25/2007  . 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 HEPATITIS C 08/25/2007  . HYPERTENSION 08/25/2007  . 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  . HYPOGONADISM 09/27/2009     Past Surgical History:  Procedure Laterality Date  . ABDOMINAL HERNIA REPAIR  2000  . CHOLECYSTECTOMY  1999  . COLONOSCOPY    . LUMBAR FUSION  2005  . TONSILLECTOMY AND ADENOIDECTOMY     Family History  Problem Relation Age of Onset  . Diabetes Maternal Grandmother   . Cancer Neg Hx   . Colon cancer Neg Hx   . Esophageal cancer Neg Hx   . Rectal cancer Neg Hx   . Stomach cancer Neg Hx    Social History  Substance Use Topics  . Smoking status: Never Smoker  . Smokeless tobacco: Never Used  . Alcohol use No     Comment: 3-4 beers per week; no alcohol per pt as of 02-09-17 visit   Current Outpatient Prescriptions  Medication Sig Dispense Refill  . allopurinol (ZYLOPRIM) 300 MG tablet Take 300 mg by mouth daily.      Marland Kitchen amLODipine (NORVASC) 10 MG tablet TAKE 1 TABLET BY MOUTH EVERY DAY 90 tablet 1  . aspirin 325 MG tablet Take 325 mg by mouth daily.      . bromocriptine  (PARLODEL) 2.5 MG tablet 1/4 tab daily 8 tablet 11  . colchicine 0.6 MG tablet Take 0.6 mg by mouth. 1 tablet every hour as needed for gout, not to exceed 6/day    . furosemide (LASIX) 20 MG tablet TAKE 1 TABLET (20 MG TOTAL) BY MOUTH DAILY. 90 tablet 3  . losartan (COZAAR) 100 MG tablet TAKE 1 TABLET (100 MG TOTAL) BY MOUTH DAILY. 90 tablet 3  . metFORMIN (GLUCOPHAGE) 1000 MG tablet TAKE 1 TABLET BY MOUTH 2 TIMES DAILY WITH A MEAL. 180 tablet 1  . omeprazole (PRILOSEC) 20 MG capsule Take 1 capsule (20 mg total) by mouth daily. 90 capsule 3  . oxyCODONE-acetaminophen (PERCOCET) 10-325 MG tablet Take 1 tablet by mouth every 4 (four) hours as needed for pain. 120 tablet 0  . repaglinide (PRANDIN) 0.5 MG tablet Take 1 tablet (0.5 mg total) by mouth 3 (three) times daily before meals. 270 tablet 1  . sitaGLIPtin (JANUVIA) 100 MG tablet Take 1 tablet (100 mg total) by mouth daily. 30 tablet 6   No current facility-administered medications for this visit.    Allergies  Allergen Reactions  . Gabapentin Rash    And diplopia     Review of Systems: All systems reviewed and negative except where noted in HPI.  Physical Exam: BP 118/76   Pulse 76   Wt 211 lb 12.8 oz (96.1 kg)   BMI 29.54 kg/m  Constitutional:  Well-developed, white male male in no acute distress. Psychiatric: Normal mood and affect. Behavior is normal. HEENT: Normocephalic and atraumatic. Conjunctivae are normal. No scleral icterus. Neck supple.  Cardiovascular: Normal rate, regular rhythm.  Pulmonary/chest: Effort normal and breath sounds normal. No wheezing, rales or rhonchi. Abdominal: Soft, nondistended, nontender. Bowel sounds active throughout. There are no masses palpable. No hepatomegaly. Extremities: no edema Lymphadenopathy: No cervical adenopathy noted. Neurological: Alert and oriented to person place and time. Skin: Skin is warm and dry. No rashes noted.   ASSESSMENT AND PLAN:  62. 76 yo male with HCV  cirrhosis,  referred for varices screening.  The risks and benefits of EGD were discussed and the patient agrees to proceed. No evidence for decompensation at this time. Procedure will be done at [facility]  2. HCV, on week 1 of 12 of Epclusa. Followed by [facility].   3. Hx of adenomatous colon polyps 2013. Due for surveillance exam in June of this year. Patient willl be scheduled for colonoscopy with possible polypectomy to be done at time of EGD.   The risks and benefits of the procedure were discussed and the patient agrees to proceed.    Tye Savoy, NP  02/09/2017, 9:54 AM   Cc: Roosevelt Locks, NP

## 2017-02-09 NOTE — Patient Instructions (Signed)
You have been scheduled for an endoscopy and colonoscopy. Please follow the written instructions given to you at your visit today. Please pick up your prep supplies at the pharmacy within the next 1-3 days. If you use inhalers (even only as needed), please bring them with you on the day of your procedure. Your physician has requested that you go to www.startemmi.com and enter the access code given to you at your visit today. This web site gives a general overview about your procedure. However, you should still follow specific instructions given to you by our office regarding your preparation for the procedure.  If you are age 23 or older, your body mass index should be between 23-30. Your Body mass index is 29.54 kg/m. If this is out of the aforementioned range listed, please consider follow up with your Primary Care Provider.  If you are age 46 or younger, your body mass index should be between 19-25. Your Body mass index is 29.54 kg/m. If this is out of the aformentioned range listed, please consider follow up with your Primary Care Provider.

## 2017-02-09 NOTE — Progress Notes (Signed)
Reviewed and agree with documentation and assessment and plan. K. Veena Kaylub Detienne , MD   

## 2017-02-18 ENCOUNTER — Ambulatory Visit
Admission: RE | Admit: 2017-02-18 | Discharge: 2017-02-18 | Disposition: A | Discharge status: Home / Self Care | Payer: Medicare Other | Source: Ambulatory Visit | Attending: Endocrinology | Admitting: Endocrinology

## 2017-02-18 ENCOUNTER — Encounter: Payer: Self-pay | Admitting: Endocrinology

## 2017-02-18 ENCOUNTER — Ambulatory Visit (INDEPENDENT_AMBULATORY_CARE_PROVIDER_SITE_OTHER): Payer: Medicare Other | Admitting: Endocrinology

## 2017-02-18 VITALS — BP 134/82 | HR 73 | Ht 71.0 in | Wt 213.0 lb

## 2017-02-18 DIAGNOSIS — M545 Low back pain, unspecified: Secondary | ICD-10-CM

## 2017-02-18 DIAGNOSIS — M4185 Other forms of scoliosis, thoracolumbar region: Secondary | ICD-10-CM | POA: Diagnosis not present

## 2017-02-18 LAB — SEDIMENTATION RATE: Sed Rate: 5 mm/hr (ref 0–20)

## 2017-02-18 MED ORDER — CYCLOBENZAPRINE HCL 10 MG PO TABS: 10.0000 mg | ORAL_TABLET | Freq: Three times a day (TID) | ORAL | 0 refills | Status: DC | PRN

## 2017-02-18 NOTE — Patient Instructions (Addendum)
I have sent a prescription to your pharmacy, for a medication to take along with the pain medication.  A blood test and an x-ray, are requested for you today.  We'll let you know about the results.   I hope you feel better soon.  If you don't feel better by next week, please call back.

## 2017-02-18 NOTE — Progress Notes (Signed)
Subjective:    Patient ID: Larry Robertson, male    DOB: [birth year], 76 y.o.   MRN: [id number]  HPI Pt states 5 days of moderate worsening of chronic pain at the lower back.  No assoc numbness of the LE's.  He is unable to cite reason, such as injury.  Past Medical History:  Diagnosis Date  . Arthritis   . Colon polyps 05/19/2012  . 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 DIABETES MELLITUS, TYPE II 08/25/2007  . Diastolic dysfunction    on echo  . Diverticulosis   . ERECTILE DYSFUNCTION 08/25/2007  . 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 HEPATITIS C 08/25/2007  . HYPERTENSION 08/25/2007  . 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  . HYPOGONADISM 09/27/2009    Past Surgical History:  Procedure Laterality Date  . ABDOMINAL HERNIA REPAIR  2000  . CHOLECYSTECTOMY  1999  . COLONOSCOPY    . LUMBAR FUSION  2005  . TONSILLECTOMY AND ADENOIDECTOMY      Social History   Social History  . Marital status: Single    Spouse name: N/A  . Number of children: 7  . Years of education: N/A   Occupational History  . 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 Smoking status: Never Smoker  . Smokeless tobacco: Never Used  . Alcohol use No     Comment: 3-4 beers per week; no alcohol per pt as of 02-09-17 visit  . Drug use: No  . Sexual activity: Not on file   Other Topics Concern  . Not on file   Social History Narrative   Divorced    Current Outpatient Prescriptions on File Prior to Visit  Medication Sig Dispense Refill  . allopurinol (ZYLOPRIM) 300 MG tablet Take 300 mg by mouth daily.      Marland Kitchen amLODipine (NORVASC) 10 MG tablet TAKE 1 TABLET BY MOUTH EVERY DAY 90 tablet 1  . aspirin 325 MG tablet Take 325 mg by mouth daily.      . bromocriptine (PARLODEL) 2.5 MG tablet 1/4 tab daily 8 tablet 11  . colchicine 0.6 MG tablet Take 0.6 mg by mouth. 1 tablet every hour as needed for gout, not to exceed 6/day    . furosemide (LASIX) 20 MG tablet TAKE 1  TABLET (20 MG TOTAL) BY MOUTH DAILY. 90 tablet 3  . losartan (COZAAR) 100 MG tablet TAKE 1 TABLET (100 MG TOTAL) BY MOUTH DAILY. 90 tablet 3  . metFORMIN (GLUCOPHAGE) 1000 MG tablet TAKE 1 TABLET BY MOUTH 2 TIMES DAILY WITH A MEAL. 180 tablet 1  . Na Sulfate-K Sulfate-Mg Sulf 17.5-3.13-1.6 GM/180ML SOLN Suprep-Use as directed 354 mL 0  . oxyCODONE-acetaminophen (PERCOCET) 10-325 MG tablet Take 1 tablet by mouth every 4 (four) hours as needed for pain. 120 tablet 0  . repaglinide (PRANDIN) 0.5 MG tablet Take 1 tablet (0.5 mg total) by mouth 3 (three) times daily before meals. 270 tablet 1  . sitaGLIPtin (JANUVIA) 100 MG tablet Take 1 tablet (100 mg total) by mouth daily. 30 tablet 6  . Sofosbuvir-Velpatasvir (EPCLUSA) 400-100 MG TABS Take 1 tablet by mouth daily.     No current facility-administered medications on file prior to visit.     Allergies  Allergen Reactions  . Gabapentin Rash    And diplopia    Family History  Problem Relation Age of Onset  . Diabetes Maternal Grandmother   . Cancer Neg Hx   . Colon cancer Neg Hx   .  Esophageal cancer Neg Hx   . Rectal cancer Neg Hx   . Stomach cancer Neg Hx     BP 134/82   Pulse 73   Ht 5\' 11"  (1.803 m)   Wt 213 lb (96.6 kg)   SpO2 94%   BMI 29.71 kg/m    Review of Systems Denies bowel or bladder retention.     Objective:   Physical Exam VITAL SIGNS:  See vs page.  GENERAL: no distress.  Spine: nontender. Neuro: sensation is intact to touch on the LE's.   Gait: normal (except that he walks with back flexed), and steady.       Assessment & Plan:  Low back pain: worse. HTN: well-controlled, despite the pain.  Patient is advised the following: Patient Instructions  I have sent a prescription to your pharmacy, for a medication to take along with the pain medication.  A blood test and an x-ray, are requested for you today.  We'll let you know about the results.   I hope you feel better soon.  If you don't feel better by  next week, please call back.

## 2017-02-23 ENCOUNTER — Other Ambulatory Visit: Payer: Self-pay

## 2017-02-23 ENCOUNTER — Ambulatory Visit (INDEPENDENT_AMBULATORY_CARE_PROVIDER_SITE_OTHER): Payer: Medicare Other | Admitting: Endocrinology

## 2017-02-23 ENCOUNTER — Encounter: Payer: Self-pay | Admitting: Endocrinology

## 2017-02-23 VITALS — BP 132/84 | HR 96 | Ht 71.0 in | Wt 211.0 lb

## 2017-02-23 DIAGNOSIS — M545 Low back pain, unspecified: Secondary | ICD-10-CM

## 2017-02-23 LAB — URINALYSIS, ROUTINE W REFLEX MICROSCOPIC
Bilirubin Urine: NEGATIVE
Hgb urine dipstick: NEGATIVE
Ketones, ur: NEGATIVE
Leukocytes, UA: NEGATIVE
Nitrite: NEGATIVE
RBC / HPF: NONE SEEN (ref 0–?)
Specific Gravity, Urine: <=1.005 — AB (ref 1.000–1.030)
Total Protein, Urine: NEGATIVE
Urine Glucose: 500 — AB
Urobilinogen, UA: 0.2 (ref 0.0–1.0)
WBC, UA: NONE SEEN (ref 0–?)
pH: 6 (ref 5.0–8.0)

## 2017-02-23 MED ORDER — METHYLPREDNISOLONE 4 MG PO TBPK: ORAL_TABLET | ORAL | 0 refills | Status: DC

## 2017-02-23 MED ORDER — OXYCODONE-ACETAMINOPHEN 10-325 MG PO TABS: 1.0000 | ORAL_TABLET | ORAL | 0 refills | Status: DC | PRN

## 2017-02-23 NOTE — Telephone Encounter (Signed)
See message and please advise, Thanks!  

## 2017-02-23 NOTE — Telephone Encounter (Signed)
Larry Robertson, Please see message and advise if another office could be contacted.

## 2017-02-23 NOTE — Progress Notes (Signed)
Subjective:    Patient ID: Larry Robertson, male    DOB: 03-25-1941, 76 y.o.   MRN: [id number]  HPI He has 1 month of severe exac of chronic low back pain.  He now has assoc pain at the left ant thigh, and tingling.   Past Medical History:  Diagnosis Date  . Arthritis   . Colon polyps 05/19/2012  . 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 DIABETES MELLITUS, TYPE II 08/25/2007  . Diastolic dysfunction    on echo  . Diverticulosis   . ERECTILE DYSFUNCTION 08/25/2007  . 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 HEPATITIS C 08/25/2007  . HYPERTENSION 08/25/2007  . 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  . HYPOGONADISM 09/27/2009    Past Surgical History:  Procedure Laterality Date  . ABDOMINAL HERNIA REPAIR  2000  . CHOLECYSTECTOMY  1999  . COLONOSCOPY    . LUMBAR FUSION  2005  . TONSILLECTOMY AND ADENOIDECTOMY      Social History   Social History  . Marital status: Single    Spouse name: N/A  . Number of children: 7  . Years of education: N/A   Occupational History  . 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 Smoking status: Never Smoker  . Smokeless tobacco: Never Used  . Alcohol use No     Comment: 3-4 beers per week; no alcohol per pt as of 02-09-17 visit  . Drug use: No  . Sexual activity: Not on file   Other Topics Concern  . Not on file   Social History Narrative   Divorced    Current Outpatient Prescriptions on File Prior to Visit  Medication Sig Dispense Refill  . allopurinol (ZYLOPRIM) 300 MG tablet Take 300 mg by mouth daily.      Marland Kitchen amLODipine (NORVASC) 10 MG tablet TAKE 1 TABLET BY MOUTH EVERY DAY 90 tablet 1  . aspirin 325 MG tablet Take 325 mg by mouth daily.      . bromocriptine (PARLODEL) 2.5 MG tablet 1/4 tab daily 8 tablet 11  . colchicine 0.6 MG tablet Take 0.6 mg by mouth. 1 tablet every hour as needed for gout, not to exceed 6/day    . cyclobenzaprine (FLEXERIL) 10 MG tablet Take 1 tablet (10 mg total) by mouth 3  (three) times daily as needed for muscle spasms. 30 tablet 0  . furosemide (LASIX) 20 MG tablet TAKE 1 TABLET (20 MG TOTAL) BY MOUTH DAILY. 90 tablet 3  . losartan (COZAAR) 100 MG tablet TAKE 1 TABLET (100 MG TOTAL) BY MOUTH DAILY. 90 tablet 3  . metFORMIN (GLUCOPHAGE) 1000 MG tablet TAKE 1 TABLET BY MOUTH 2 TIMES DAILY WITH A MEAL. 180 tablet 1  . Na Sulfate-K Sulfate-Mg Sulf 17.5-3.13-1.6 GM/180ML SOLN Suprep-Use as directed 354 mL 0  . repaglinide (PRANDIN) 0.5 MG tablet Take 1 tablet (0.5 mg total) by mouth 3 (three) times daily before meals. 270 tablet 1  . sitaGLIPtin (JANUVIA) 100 MG tablet Take 1 tablet (100 mg total) by mouth daily. 30 tablet 6  . Sofosbuvir-Velpatasvir (EPCLUSA) 400-100 MG TABS Take 1 tablet by mouth daily.     No current facility-administered medications on file prior to visit.     Allergies  Allergen Reactions  . Gabapentin Rash    And diplopia    Family History  Problem Relation Age of Onset  . Diabetes Maternal Grandmother   . Cancer Neg Hx   . Colon cancer Neg Hx   .  Esophageal cancer Neg Hx   . Rectal cancer Neg Hx   . Stomach cancer Neg Hx     BP 132/84   Pulse 96   Ht 5\' 11"  (1.803 m)   Wt 211 lb (95.7 kg)   SpO2 92%   BMI 29.43 kg/m    Review of Systems Denies hematuria and urinary retention.      Objective:   Physical Exam VITAL SIGNS:  See vs page.  GENERAL: no distress.  Spine: nontender.  Gait: walks with the spine flexed.   Neuro: sensation is intact to touch on the LE's.    Lab Results  Component Value Date   HGBA1C 7.3 01/06/2017      Assessment & Plan:  Low-back pain: worse.   Type 2 DM: steroids could worsen this.   Patient is advised the following: Patient Instructions  Please see a specialist for your symptoms.  you will receive a phone call, about a day and time for an appointment.   check your blood sugar once a day.  vary the time of day when you check, between before the 3 meals, and at bedtime.  also check  if you have symptoms of your blood sugar being too high or too low.  please keep a record of the readings and bring it to your next appointment here (or you can bring the meter itself).  You can write it on any piece of paper.  please call us sooner if your blood sugar goes below 70, or if you have a lot of readings over 200.  This is important, because the steroids may increase your blood sugar.  I have sent a prescription to your pharmacy, for a steroid "pack."  Here is a refill of your pain medication.

## 2017-02-23 NOTE — Telephone Encounter (Signed)
FYI orthopedic office cannot see him until May is there another one we can send him to?

## 2017-02-23 NOTE — Telephone Encounter (Signed)
Please ask [facility] to try a different practice

## 2017-02-23 NOTE — Patient Instructions (Addendum)
Please see a specialist for your symptoms.  you will receive a phone call, about a day and time for an appointment.   check your blood sugar once a day.  vary the time of day when you check, between before the 3 meals, and at bedtime.  also check if you have symptoms of your blood sugar being too high or too low.  please keep a record of the readings and bring it to your next appointment here (or you can bring the meter itself).  You can write it on any piece of paper.  please call us sooner if your blood sugar goes below 70, or if you have a lot of readings over 200.  This is important, because the steroids may increase your blood sugar.  I have sent a prescription to your pharmacy, for a steroid "pack."  Here is a refill of your pain medication.

## 2017-03-03 ENCOUNTER — Emergency Department (HOSPITAL_COMMUNITY)
Admission: EM | Admit: 2017-03-03 | Discharge: 2017-03-03 | Disposition: A | Discharge status: Home / Self Care | Payer: Medicare Other | Attending: Emergency Medicine | Admitting: Emergency Medicine

## 2017-03-03 ENCOUNTER — Encounter (HOSPITAL_COMMUNITY): Payer: Self-pay | Admitting: Emergency Medicine

## 2017-03-03 ENCOUNTER — Emergency Department (HOSPITAL_COMMUNITY): Payer: Medicare Other

## 2017-03-03 DIAGNOSIS — I10 Essential (primary) hypertension: Secondary | ICD-10-CM | POA: Diagnosis not present

## 2017-03-03 DIAGNOSIS — E119 Type 2 diabetes mellitus without complications: Secondary | ICD-10-CM | POA: Diagnosis not present

## 2017-03-03 DIAGNOSIS — R109 Unspecified abdominal pain: Secondary | ICD-10-CM | POA: Diagnosis not present

## 2017-03-03 DIAGNOSIS — T4796XA Underdosing of unspecified agents primarily affecting the gastrointestinal system, initial encounter: Secondary | ICD-10-CM | POA: Insufficient documentation

## 2017-03-03 DIAGNOSIS — G8929 Other chronic pain: Secondary | ICD-10-CM | POA: Diagnosis not present

## 2017-03-03 DIAGNOSIS — M545 Low back pain, unspecified: Secondary | ICD-10-CM

## 2017-03-03 DIAGNOSIS — Z7982 Long term (current) use of aspirin: Secondary | ICD-10-CM | POA: Diagnosis not present

## 2017-03-03 DIAGNOSIS — K5903 Drug induced constipation: Secondary | ICD-10-CM | POA: Diagnosis not present

## 2017-03-03 DIAGNOSIS — Z7984 Long term (current) use of oral hypoglycemic drugs: Secondary | ICD-10-CM | POA: Diagnosis not present

## 2017-03-03 DIAGNOSIS — M549 Dorsalgia, unspecified: Secondary | ICD-10-CM

## 2017-03-03 DIAGNOSIS — Z79899 Other long term (current) drug therapy: Secondary | ICD-10-CM | POA: Insufficient documentation

## 2017-03-03 LAB — CBC WITH DIFFERENTIAL/PLATELET
Basophils Absolute: 0.1 10*3/uL (ref 0.0–0.1)
Basophils Relative: 1 %
Eosinophils Absolute: 0.2 10*3/uL (ref 0.0–0.7)
Eosinophils Relative: 2 %
HCT: 41 % (ref 39.0–52.0)
Hemoglobin: 14.2 g/dL (ref 13.0–17.0)
Lymphocytes Relative: 41 %
Lymphs Abs: 3.7 10*3/uL (ref 0.7–4.0)
MCH: 30.9 pg (ref 26.0–34.0)
MCHC: 34.6 g/dL (ref 30.0–36.0)
MCV: 89.3 fL (ref 78.0–100.0)
Monocytes Absolute: 0.6 10*3/uL (ref 0.1–1.0)
Monocytes Relative: 7 %
Neutro Abs: 4.5 10*3/uL (ref 1.7–7.7)
Neutrophils Relative %: 49 %
Platelets: 148 10*3/uL — ABNORMAL LOW (ref 150–400)
RBC: 4.59 MIL/uL (ref 4.22–5.81)
RDW: 12.9 % (ref 11.5–15.5)
WBC: 9 10*3/uL (ref 4.0–10.5)

## 2017-03-03 LAB — COMPREHENSIVE METABOLIC PANEL
ALT: 64 U/L — ABNORMAL HIGH (ref 17–63)
AST: 43 U/L — ABNORMAL HIGH (ref 15–41)
Albumin: 3.8 g/dL (ref 3.5–5.0)
Alkaline Phosphatase: 63 U/L (ref 38–126)
Anion gap: 9 (ref 5–15)
BUN: 16 mg/dL (ref 6–20)
CO2: 26 mmol/L (ref 22–32)
Calcium: 9.3 mg/dL (ref 8.9–10.3)
Chloride: 98 mmol/L — ABNORMAL LOW (ref 101–111)
Creatinine, Ser: 0.93 mg/dL (ref 0.61–1.24)
GFR calc Af Amer: >60 mL/min (ref >60)
GFR calc non Af Amer: >60 mL/min (ref >60)
Glucose, Bld: 273 mg/dL — ABNORMAL HIGH (ref 65–99)
Potassium: 3.7 mmol/L (ref 3.5–5.1)
Sodium: 133 mmol/L — ABNORMAL LOW (ref 135–145)
Total Bilirubin: 1 mg/dL (ref 0.3–1.2)
Total Protein: 6.5 g/dL (ref 6.5–8.1)

## 2017-03-03 LAB — LIPASE, BLOOD: Lipase: 24 U/L (ref 11–51)

## 2017-03-03 MED ORDER — METHOCARBAMOL 500 MG PO TABS
500.0000 mg | ORAL_TABLET | Freq: Once | ORAL | Status: AC
  Administered 2017-03-03: 500 mg via ORAL
  Filled 2017-03-03: qty 1

## 2017-03-03 MED ORDER — IBUPROFEN 200 MG PO TABS
600.0000 mg | ORAL_TABLET | Freq: Once | ORAL | Status: AC
  Administered 2017-03-03: 600 mg via ORAL
  Filled 2017-03-03: qty 1

## 2017-03-03 MED ORDER — IOPAMIDOL (ISOVUE-300) INJECTION 61%
INTRAVENOUS | Status: AC
  Administered 2017-03-03: 100 mL
  Filled 2017-03-03: qty 100

## 2017-03-03 MED ORDER — OXYCODONE-ACETAMINOPHEN 5-325 MG PO TABS
2.0000 | ORAL_TABLET | Freq: Once | ORAL | Status: AC
  Administered 2017-03-03: 2 via ORAL
  Filled 2017-03-03: qty 2

## 2017-03-03 MED ORDER — METHOCARBAMOL 500 MG PO TABS: 500.0000 mg | ORAL_TABLET | Freq: Every evening | ORAL | 0 refills | Status: DC | PRN

## 2017-03-03 MED ORDER — SODIUM CHLORIDE 0.9 % IV BOLUS (SEPSIS)
1000.0000 mL | Freq: Once | INTRAVENOUS | Status: AC
  Administered 2017-03-03: 1000 mL via INTRAVENOUS

## 2017-03-03 MED ORDER — MORPHINE SULFATE (PF) 4 MG/ML IV SOLN
4.0000 mg | Freq: Once | INTRAVENOUS | Status: AC
  Administered 2017-03-03: 4 mg via INTRAVENOUS
  Filled 2017-03-03: qty 1

## 2017-03-03 MED ORDER — DOCUSATE SODIUM 100 MG PO CAPS: 100.0000 mg | ORAL_CAPSULE | Freq: Two times a day (BID) | ORAL | 0 refills | Status: DC

## 2017-03-03 NOTE — ED Notes (Signed)
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

## 2017-03-03 NOTE — Discharge Instructions (Signed)
Start stool softener (Colace) Continue home pain medicines (Percocet) Try muscle relaxer (Robaxin) and heating pad Follow up with neurosurgery

## 2017-03-03 NOTE — ED Provider Notes (Signed)
[facility] DEPT Provider Note   CSN: [id number] Arrival date & time: 03/03/17  7858    History   Chief Complaint Chief Complaint  Patient presents with  . Back Pain    HPI Larry Robertson is a 76 y.o. male who presents with acute on chronic back pain. Hx of lumbar fusion. He has had gradually worsening low back pain 3 weeks ago. It radiates to the left buttock. Has seen his PCP 2x for this issue in the past couple weeks. Had xray on 3/21 which shows scoliosis, DDD, and old L4 compression fx. Been taking Flexaril and Percocet 10/325 with minimal relief. Tried Gabapentin but could not tolerate. Was put on steroid dose pack which did not help. 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 Has appt with neurosurgery in April. No fever, syncope, trauma, unexplained weight loss, hx of cancer, loss of bowel/bladder function, saddle anesthesia, urinary retention, IVDU. Some abdominal pain - states he does not smoke and never has.   HPI  Past Medical History:  Diagnosis Date  . Arthritis   . Colon polyps 05/19/2012  . 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 DIABETES MELLITUS, TYPE II 08/25/2007  . Diastolic dysfunction    on echo  . Diverticulosis   . ERECTILE DYSFUNCTION 08/25/2007  . 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 HEPATITIS C 08/25/2007  . HYPERTENSION 08/25/2007  . 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  . HYPOGONADISM 09/27/2009    Patient Active Problem List   Diagnosis Date Noted  . Thrombocytopenia ([place]) 10/15/2016  . 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 Nocturia 08/09/2014  . Hematuria 08/09/2014  . Other dysphagia 05/18/2014  . Personal history of colonic polyps 07/13/2013  . Nonspecific abnormal electrocardiogram (ECG) (EKG) 04/20/2013  . Encounter for long-term (current) use of other medications 04/15/2012  . Routine general medical examination at a health care facility 04/15/2012  . Radiculopathy of leg 10/08/2011  . Blurry vision  10/08/2011  . Special screening, prostate cancer 04/09/2011  . Lumbago 04/09/2011  . OTHER SPEC HYPERTROPHIC&ATROPHIC CONDITION SKIN 02/06/2010  . FOOT PAIN, LEFT 02/06/2010  . HYPOGONADISM 09/27/2009  . 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 FLANK PAIN, RIGHT 07/17/2009  . 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  . Acute hepatitis C virus infection 08/25/2007  . Diabetes ([place]) 08/25/2007  . 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  . ERECTILE DYSFUNCTION 08/25/2007  . Essential hypertension 08/25/2007    Past Surgical History:  Procedure Laterality Date  . ABDOMINAL HERNIA REPAIR  2000  . CHOLECYSTECTOMY  1999  . COLONOSCOPY    . LUMBAR FUSION  2005  . TONSILLECTOMY AND ADENOIDECTOMY       Home Medications    Prior to Admission medications   Medication Sig Start Date End Date Taking? Authorizing Provider  allopurinol (ZYLOPRIM) 300 MG tablet Take 300 mg by mouth daily.      Historical Provider, MD  amLODipine (NORVASC) 10 MG tablet TAKE 1 TABLET BY MOUTH EVERY DAY 11/19/16   Renato Shin, MD  aspirin 325 MG tablet Take 325 mg by mouth daily.      Historical Provider, MD  bromocriptine (PARLODEL) 2.5 MG tablet 1/4 tab daily 04/14/16   Renato Shin, MD  colchicine 0.6 MG tablet Take 0.6 mg by mouth. 1 tablet every hour as needed for gout, not to exceed 6/day    Historical Provider, MD  cyclobenzaprine (FLEXERIL) 10 MG tablet Take 1 tablet (10 mg total) by mouth 3 (three) times daily as needed for muscle spasms. 02/18/17   Renato Shin,  MD  furosemide (LASIX) 20 MG tablet TAKE 1 TABLET (20 MG TOTAL) BY MOUTH DAILY. 03/27/15   Renato Shin, MD  losartan (COZAAR) 100 MG tablet TAKE 1 TABLET (100 MG TOTAL) BY MOUTH DAILY. 04/16/16   Renato Shin, MD  metFORMIN (GLUCOPHAGE) 1000 MG tablet TAKE 1 TABLET BY MOUTH 2 TIMES DAILY WITH A MEAL. 11/09/16   Renato Shin, MD  methylPREDNISolone (MEDROL DOSEPAK) 4 MG TBPK tablet Per instructions 02/23/17   Renato Shin, MD  Na Sulfate-K Sulfate-Mg Sulf  17.5-3.13-1.6 GM/180ML SOLN Suprep-Use as directed 02/09/17   Willia Craze, NP  oxyCODONE-acetaminophen (PERCOCET) 10-325 MG tablet Take 1 tablet by mouth every 4 (four) hours as needed for pain. 02/23/17   Renato Shin, MD  repaglinide (PRANDIN) 0.5 MG tablet Take 1 tablet (0.5 mg total) by mouth 3 (three) times daily before meals. 12/09/16   Renato Shin, MD  sitaGLIPtin (JANUVIA) 100 MG tablet Take 1 tablet (100 mg total) by mouth daily. 08/06/16   Renato Shin, MD  Sofosbuvir-Velpatasvir (EPCLUSA) 400-100 MG TABS Take 1 tablet by mouth daily.    Historical Provider, MD    Family History Family History  Problem Relation Age of Onset  . Diabetes Maternal Grandmother   . Cancer Neg Hx   . Colon cancer Neg Hx   . Esophageal cancer Neg Hx   . Rectal cancer Neg Hx   . Stomach cancer Neg Hx     Social History Social History  Substance Use Topics  . Smoking status: Never Smoker  . Smokeless tobacco: Never Used  . Alcohol use No     Comment: 3-4 beers per week; no alcohol per pt as of 02-09-17 visit     Allergies   Gabapentin   Review of Systems Review of Systems  Constitutional: Negative for fever.  Gastrointestinal: Negative for abdominal pain.  Musculoskeletal: Positive for arthralgias, back pain, gait problem and myalgias.  All other systems reviewed and are negative.    Physical Exam Updated Vital Signs BP (!) 163/93 (BP Location: Right Arm)   Pulse 90   Temp 97.9 F (36.6 C) (Oral)   Resp 18   SpO2 95%   Physical Exam  Constitutional: He is oriented to person, place, and time. He appears well-developed and well-nourished. No distress.  HENT:  Head: Normocephalic and atraumatic.  Eyes: Conjunctivae are normal. Pupils are equal, round, and reactive to light. Right eye exhibits no discharge. Left eye exhibits no discharge. No scleral icterus.  Neck: Normal range of motion.  Cardiovascular: Normal rate.   Pulmonary/Chest: Effort normal. No respiratory distress.    Abdominal: Soft. Bowel sounds are normal. He exhibits no distension and no mass. There is tenderness. There is no rebound and no guarding. No hernia.  Mild central abdominal tenderness  Musculoskeletal:  Inspection: No masses, or rash. Kyphosis of lower spine. Palpation: No midline spinal tenderness. Left sided lumbar paraspinal muscle tenderness and left buttocks tenderness Strength: 5/5 in lower extremities and normal plantar and dorsiflexion Sensation: Intact sensation with light touch in lower extremities bilaterally Reflexes: Patellar reflex is 2+ bilaterally SLR: Negative seated straight leg raise Gait: Antalgic   Neurological: He is alert and oriented to person, place, and time.  Skin: Skin is warm and dry.  Psychiatric: He has a normal mood and affect. His behavior is normal.  Nursing note and vitals reviewed.    ED Treatments / Results  Labs (all labs ordered are listed, but only abnormal results are displayed) Labs Reviewed  CBC WITH  DIFFERENTIAL/PLATELET - Abnormal; Notable for the following:       Result Value   Platelets 148 (*)    All other components within normal limits  COMPREHENSIVE METABOLIC PANEL - Abnormal; Notable for the following:    Sodium 133 (*)    Chloride 98 (*)    Glucose, Bld 273 (*)    AST 43 (*)    ALT 64 (*)    All other components within normal limits  LIPASE, BLOOD    EKG  EKG Interpretation None       Radiology Ct Abdomen Pelvis W Contrast  Result Date: 03/03/2017 CLINICAL DATA:  Abdominal pain and nausea EXAM: CT ABDOMEN AND PELVIS WITH CONTRAST TECHNIQUE: Multidetector CT imaging of the abdomen and pelvis was performed using the standard protocol following bolus administration of intravenous contrast. CONTRAST:  179mL ISOVUE-300 IOPAMIDOL (ISOVUE-300) INJECTION 61% COMPARISON:  September 20, 2014 FINDINGS: Lower chest: There is persistent elevation of the left hemidiaphragm. There is mild bibasilar scarring and atelectasis. No lung  base edema or consolidation. There are foci of coronary artery calcification. There is a small hiatal hernia. Hepatobiliary: Liver measures 21.8 cm in length. Liver contour appears stable and unremarkable. No focal liver lesions are apparent. Gallbladder is absent. There is no appreciable biliary duct dilatation. Pancreas: No pancreatic mass or inflammatory focus evident. Spleen: No splenic lesions are evident. Spleen measures 16.2 x 14.8 x 6.9 cm with a measured splenic volume of 827 cubic cm. Adrenals/Urinary Tract: Adrenals appear unremarkable bilaterally. There is a cyst arising from the lateral upper to mid left kidney measuring 2.3 x 2.3 cm. There is a cyst arising from the medial mid right kidney measuring 1.3 x 1.0 cm. There is a prominent extrarenal pelvis on each side. There is no hydronephrosis on either side. There is no renal or ureteral calculus on either side. Urinary bladder is midline with wall thickness within normal limits. Urinary bladder is somewhat distended. Stomach/Bowel: There is moderate stool throughout the colon. There is no appreciable bowel wall or mesenteric thickening. No bowel obstruction. No free air or portal venous air. Vascular/Lymphatic: There is atherosclerotic calcification in the aorta and common iliac arteries. Aorta is ectatic but shows no evidence of aneurysm. Major mesenteric vessels appear patent. There is no evident adenopathy in the abdomen or pelvis. Reproductive: Prostate and seminal vesicles appear normal in size and contour. No pelvic mass. Other: Appendix appears normal. There is no ascites or abscess in the abdomen or pelvis. There is fat in each inguinal ring. Musculoskeletal: There is extensive degenerative type change in the lumbar spine. There are no blastic or lytic bone lesions. There is spinal stenosis at L2-3 and L3-4 and to a lesser extent at L4-5 due to diffuse disc protrusion coupled with bony hypertrophy. There is no intramuscular or abdominal wall  lesion evident. IMPRESSION: Hepatomegaly and splenomegaly. No focal liver or splenic lesions are evident. Gallbladder absent. No bowel obstruction.  No abscess.  Appendix appears normal. No renal or ureteral calculus. No hydronephrosis. Urinary bladder appears somewhat distended. There is aortoiliac atherosclerosis. There are foci of coronary artery calcification. Spinal stenosis at L2-3, L3-4, and to a slightly lesser extent at L4-5, multifactorial. Stable elevation of the left hemidiaphragm. Electronically Signed   By: Lowella Grip III M.D.   On: 03/03/2017 15:56   Ct L-spine No Charge  Result Date: 03/03/2017 CLINICAL DATA:  Left hip pain and back pain.  Lumbar scoliosis. EXAM: CT LUMBAR SPINE WITHOUT CONTRAST TECHNIQUE: Multidetector CT imaging of  the lumbar spine was performed without intravenous contrast administration. Multiplanar CT image reconstructions were also generated. COMPARISON:  Radiographs dated 02/18/2017 and CT scan abdomen dated 09/20/2014 and lumbar MRI dated 06/16/2007 FINDINGS: Segmentation: 5 lumbar type vertebrae. Alignment: Lumbar scoliosis with convexity to the left centered at L2-3. Slight retrolisthesis of L4 on L5, chronic. Vertebrae: Severe degenerative changes of the vertebral endplates at M6-2 and to a lesser degree at L3-4 and L4-5. Paraspinal and other soft tissues: Aortic atherosclerosis. 15 mm low-density lesion in the medial aspect of the right mid kidney, most likely a cyst. Disc levels: T12-L1: Asymmetric disc bulge to the left. Moderate bilateral facet arthritis with mild spinal stenosis. L1-2: Severe degenerative disc disease with marked disc space narrowing. Tiny broad-based disc bulge. Hypertrophy of the ligamentum flavum and facet joints combine to create moderately severe spinal stenosis as previously demonstrated on MRI. L2-3: Small broad-based disc bulge. Hypertrophy of the ligamentum flavum combine to create moderate spinal stenosis. L3-4: Prominent Schmorl's  node in the superior endplate of L4, chronic. No appreciable disc bulging or protrusion. L4-5: Chronic broad-based disc protrusion with accompanying osteophytes with severe bilateral foraminal stenosis, left worse than right which could certainly affect the left L4 nerve. However, this is chronic. Compression of both lateral recesses which could affect the L5 nerves, also chronic. L5-S1: Normal disc. Minimal degenerative changes of the facet joints. IMPRESSION: 1. Broad-based chronic soft disc protrusion with accompanying osteophytes at L4-5 with severe bilateral foraminal stenosis, left greater than right which could affect the L4 nerve on the left. However, these findings are unchanged. 2. Bilateral lateral recess impingement at L4-5 which could affect either or both L5 nerves. However, these findings are chronic. 3. Severe degenerative disc disease at L1-2 with moderately severe spinal stenosis, chronic. 4. No acute abnormalities. Electronically Signed   By: Lorriane Shire M.D.   On: 03/03/2017 16:02    Procedures Procedures (including critical care time)  Medications Ordered in ED Medications  oxyCODONE-acetaminophen (PERCOCET/ROXICET) 5-325 MG per tablet 2 tablet (2 tablets Oral Given 03/03/17 1131)  ibuprofen (ADVIL,MOTRIN) tablet 600 mg (600 mg Oral Given 03/03/17 1132)  methocarbamol (ROBAXIN) tablet 500 mg (500 mg Oral Given 03/03/17 1132)  sodium chloride 0.9 % bolus 1,000 mL (0 mLs Intravenous Stopped 03/03/17 1622)  morphine 4 MG/ML injection 4 mg (4 mg Intravenous Given 03/03/17 1446)  iopamidol (ISOVUE-300) 61 % injection (100 mLs  Contrast Given 03/03/17 1512)     Initial Impression / Assessment and Plan / ED Course  I have reviewed the triage vital signs and the nursing notes.  Pertinent labs & imaging results that were available during my care of the patient were reviewed by me and considered in my medical decision making (see chart for details).  76 year old male with back and abdominal  pain. He is hypertensive, otherwise vitals are normal. Will obtain labs and CT of abdomen and L spine. Pain control given in ED. CBC is unremarkable. CMP remarkable for mild hyponatremia, hypochloremia, hyperglycemia (273), and mildly elevated transaminases due to his Hep C. Lipase normal.  Shared visit with Dr. Darl Householder. Due to abdominal tenderness will obtain CT.   CT overall negative. Just shows multilevel degenerative changes. Pt has not really gotten any relief from his pain but unlikely that we will be able to control it here. He already has pain medicine at home. Offered muscle relaxer which he will try. Advised follow up with neurosurgery. Return precautions given.  Final Clinical Impressions(s) / ED Diagnoses  Final diagnoses:  Back pain  Chronic left-sided low back pain without sciatica  Drug-induced constipation    New Prescriptions New Prescriptions   No medications on file     Recardo Evangelist, PA-C 03/04/17 [place]ale Yao, MD 03/04/17 [phone]

## 2017-03-03 NOTE — ED Triage Notes (Signed)
Pt sts lower back pain into left hip x 3 weeks and left arm pain

## 2017-03-03 NOTE — ED Notes (Signed)
Pt stable, understands discharge instructions, and reasons for return.   

## 2017-03-03 NOTE — ED Notes (Signed)
PA at bedside.

## 2017-03-11 ENCOUNTER — Encounter: Payer: Self-pay | Admitting: Gastroenterology

## 2017-03-11 ENCOUNTER — Ambulatory Visit (AMBULATORY_SURGERY_CENTER): Payer: Medicare Other | Admitting: Gastroenterology

## 2017-03-11 VITALS — BP 148/83 | HR 65 | Temp 98.6°F | Resp 19 | Ht 71.0 in | Wt 211.0 lb

## 2017-03-11 DIAGNOSIS — Z86010 Personal history of colonic polyps: Secondary | ICD-10-CM

## 2017-03-11 DIAGNOSIS — K7460 Unspecified cirrhosis of liver: Secondary | ICD-10-CM

## 2017-03-11 DIAGNOSIS — D125 Benign neoplasm of sigmoid colon: Secondary | ICD-10-CM

## 2017-03-11 DIAGNOSIS — B3781 Candidal esophagitis: Secondary | ICD-10-CM | POA: Diagnosis not present

## 2017-03-11 DIAGNOSIS — R1310 Dysphagia, unspecified: Secondary | ICD-10-CM | POA: Diagnosis not present

## 2017-03-11 DIAGNOSIS — K229 Disease of esophagus, unspecified: Secondary | ICD-10-CM | POA: Diagnosis not present

## 2017-03-11 DIAGNOSIS — D123 Benign neoplasm of transverse colon: Secondary | ICD-10-CM | POA: Diagnosis not present

## 2017-03-11 MED ORDER — SODIUM CHLORIDE 0.9 % IV SOLN: 500.0000 mL | INTRAVENOUS | Status: DC

## 2017-03-11 MED ORDER — FLUCONAZOLE 100 MG PO TABS: 100.0000 mg | ORAL_TABLET | Freq: Every day | ORAL | 0 refills | Status: DC

## 2017-03-11 NOTE — Patient Instructions (Addendum)
Discharge instructions given. Handouts on polyps,diverticulosis and hemorrhoids. Resume previous medications. Prescription sent into pharmacy. Hold Bromocriptine and Colchicine while taking Diflucan. YOU HAD AN ENDOSCOPIC PROCEDURE TODAY AT [facility]:   Refer to the procedure report that was given to you for any specific questions about what was found during the examination.  If the procedure report does not answer your questions, please call your gastroenterologist to clarify.  If you requested that your care partner not be given the details of your procedure findings, then the procedure report has been included in a sealed envelope for you to review at your convenience later.  YOU SHOULD EXPECT: Some feelings of bloating in the abdomen. Passage of more gas than usual.  Walking can help get rid of the air that was put into your GI tract during the procedure and reduce the bloating. If you had a lower endoscopy (such as a colonoscopy or flexible sigmoidoscopy) you may notice spotting of blood in your stool or on the toilet paper. If you underwent a bowel prep for your procedure, you may not have a normal bowel movement for a few days.  Please Note:  You might notice some irritation and congestion in your nose or some drainage.  This is from the oxygen used during your procedure.  There is no need for concern and it should clear up in a day or so.  SYMPTOMS TO REPORT IMMEDIATELY:   Following lower endoscopy (colonoscopy or flexible sigmoidoscopy):  Excessive amounts of blood in the stool  Significant tenderness or worsening of abdominal pains  Swelling of the abdomen that is new, acute  Fever of 100F or higher   Following upper endoscopy (EGD)  Vomiting of blood or coffee ground material  New chest pain or pain under the shoulder blades  Painful or persistently difficult swallowing  New shortness of breath  Fever of 100F or higher  Black, tarry-looking stools  For  urgent or emergent issues, a gastroenterologist can be reached at any hour by calling [phone].   DIET:  We do recommend a small meal at first, but then you may proceed to your regular diet.  Drink plenty of fluids but you should avoid alcoholic beverages for 24 hours.  ACTIVITY:  You should plan to take it easy for the rest of today and you should NOT DRIVE or use heavy machinery until tomorrow (because of the sedation medicines used during the test).    FOLLOW UP: Our staff will call the number listed on your records the next business day following your procedure to check on you and address any questions or concerns that you may have regarding the information given to you following your procedure. If we do not reach you, we will leave a message.  However, if you are feeling well and you are not experiencing any problems, there is no need to return our call.  We will assume that you have returned to your regular daily activities without incident.  If any biopsies were taken you will be contacted by phone or by letter within the next 1-3 weeks.  Please call us at [phone] if you have not heard about the biopsies in 3 weeks.    SIGNATURES/CONFIDENTIALITY: You and/or your care partner have signed paperwork which will be entered into your electronic medical record.  These signatures attest to the fact that that the information above on your After Visit Summary has been reviewed and is understood.  Full responsibility of the confidentiality  of this discharge information lies with you and/or your care-partner.

## 2017-03-11 NOTE — Op Note (Signed)
[place] Patient Name: Larry Robertson Procedure Date: 03/11/2017 2:07 PM MRN: [id number] Endoscopist: Mauri Pole , MD Age: 76 Referring MD:  Date of Birth: 1941-09-26 Gender: Male Account #: [id number] Procedure:                Colonoscopy Indications:              Surveillance: Personal history of adenomatous                            polyps on last colonoscopy 5 years ago Medicines:                Monitored Anesthesia Care Procedure:                Pre-Anesthesia Assessment:                           - Prior to the procedure, a History and Physical                            was performed, and patient medications and                            allergies were reviewed. The patient's tolerance of                            previous anesthesia was also reviewed. The risks                            and benefits of the procedure and the sedation                            options and risks were discussed with the patient.                            All questions were answered, and informed consent                            was obtained. Prior Anticoagulants: The patient has                            taken no previous anticoagulant or antiplatelet                            agents. ASA Grade Assessment: III - A patient with                            severe systemic disease. After reviewing the risks                            and benefits, the patient was deemed in                            satisfactory condition to undergo the procedure.  After obtaining informed consent, the colonoscope                            was passed under direct vision. Throughout the                            procedure, the patient's blood pressure, pulse, and                            oxygen saturations were monitored continuously. The                            Colonoscope was introduced through the anus and                            advanced to the the  cecum, identified by                            appendiceal orifice and ileocecal valve. The                            colonoscopy was performed without difficulty. The                            patient tolerated the procedure well. The quality                            of the bowel preparation was excellent. The                            ileocecal valve, appendiceal orifice, and rectum                            were photographed. Scope In: 2:19:43 PM Scope Out: 2:34:20 PM Scope Withdrawal Time: 0 hours 9 minutes 44 seconds  Total Procedure Duration: 0 hours 14 minutes 37 seconds  Findings:                 The perianal and digital rectal examinations were                            normal.                           Two sessile polyps were found in the sigmoid colon                            and transverse colon. The polyps were 3 to 5 mm in                            size. These polyps were removed with a cold snare.                            Resection and retrieval were complete.  Multiple small and large-mouthed diverticula were                            found in the sigmoid colon.                           Non-bleeding internal hemorrhoids were found during                            retroflexion. The hemorrhoids were small.                           The exam was otherwise without abnormality. Complications:            No immediate complications. Estimated Blood Loss:     Estimated blood loss was minimal. Impression:               - Two 3 to 5 mm polyps in the sigmoid colon and in                            the transverse colon, removed with a cold snare.                            Resected and retrieved.                           - Diverticulosis in the sigmoid colon.                           - Non-bleeding internal hemorrhoids.                           - The examination was otherwise normal. Recommendation:           - Patient has a contact number  available for                            emergencies. The signs and symptoms of potential                            delayed complications were discussed with the                            patient. Return to normal activities tomorrow.                            Written discharge instructions were provided to the                            patient.                           - Resume previous diet.                           - Continue present medications.                           -  Await pathology results.                           - No recommendation at this time regarding repeat                            colonoscopy due to age.                           - Return to GI clinic PRN. Mauri Pole, MD 03/11/2017 2:43:00 PM This report has been signed electronically.

## 2017-03-11 NOTE — Op Note (Signed)
[place] Patient Name: Larry Robertson Procedure Date: 03/11/2017 2:08 PM MRN: [id number] Endoscopist: Mauri Pole , MD Age: 76 Referring MD:  Date of Birth: 1941/11/01 Gender: Male Account #: [id number] Procedure:                Upper GI endoscopy Indications:              Dysphagia, Exclusion of esophageal varices in                            patient with suspected portal hypertension Medicines:                Monitored Anesthesia Care Procedure:                Pre-Anesthesia Assessment:                           - Prior to the procedure, a History and Physical                            was performed, and patient medications and                            allergies were reviewed. The patient's tolerance of                            previous anesthesia was also reviewed. The risks                            and benefits of the procedure and the sedation                            options and risks were discussed with the patient.                            All questions were answered, and informed consent                            was obtained. Prior Anticoagulants: The patient has                            taken no previous anticoagulant or antiplatelet                            agents. ASA Grade Assessment: III - A patient with                            severe systemic disease. After reviewing the risks                            and benefits, the patient was deemed in                            satisfactory condition to undergo the procedure.  After obtaining informed consent, the endoscope was                            passed under direct vision. Throughout the                            procedure, the patient's blood pressure, pulse, and                            oxygen saturations were monitored continuously. The                            Endoscope was introduced through the mouth, and                            advanced to  the second part of duodenum. The upper                            GI endoscopy was accomplished without difficulty.                            The patient tolerated the procedure well. Scope In: Scope Out: Findings:                 White nummular lesions were noted in the middle                            third of the esophagus. Biopsies were taken with a                            cold forceps for histology. No evidence of                            esophageal stricture                           The Z-line was regular and was found 40 cm from the                            incisors. No evidence of esophageal varices                           The stomach was normal. No gastric varices                           The examined duodenum was normal. Complications:            No immediate complications. Estimated Blood Loss:     Estimated blood loss was minimal. Impression:               - White nummular lesions in esophageal mucosa.                            Biopsied. Rule out candida esophagitis                           -  Z-line regular, 40 cm from the incisors.                           - Normal stomach.                           - Normal examined duodenum. Recommendation:           - Resume previous diet.                           - Continue present medications.                           - No ibuprofen, naproxen, or other non-steroidal                            anti-inflammatory drugs for 1 week.                           - Await pathology results.                           - Diflucan (fluconazole) 100 mg PO daily for 2                            weeks for possible candida esophagitis.                           -Proceed with colonoscopy Mauri Pole, MD 03/11/2017 2:40:21 PM This report has been signed electronically.

## 2017-03-11 NOTE — Progress Notes (Signed)
Called to room to assist during endoscopic procedure.  Patient ID and intended procedure confirmed with present staff. Received instructions for my participation in the procedure from the performing physician.  

## 2017-03-11 NOTE — Progress Notes (Signed)
To recovery, report to McCoy, RN, VSS 

## 2017-03-12 ENCOUNTER — Telehealth: Payer: Self-pay

## 2017-03-12 NOTE — Telephone Encounter (Signed)
  Follow up Call-  Call back number 03/11/2017  Post procedure Call Back phone  # [phone]  Permission to leave phone message Yes  Some recent data might be hidden     Patient questions:  Do you have a fever, pain , or abdominal swelling? No. Pain Score  0 *  Have you tolerated food without any problems? Yes.    Have you been able to return to your normal activities? Yes.    Do you have any questions about your discharge instructions: Diet   No. Medications  No. Follow up visit  No.  Do you have questions or concerns about your Care? No.  Actions: * If pain score is 4 or above: No action needed, pain <4.

## 2017-03-23 ENCOUNTER — Encounter: Payer: Self-pay | Admitting: Gastroenterology

## 2017-03-23 DIAGNOSIS — I10 Essential (primary) hypertension: Secondary | ICD-10-CM | POA: Diagnosis not present

## 2017-03-23 DIAGNOSIS — M4120 Other idiopathic scoliosis, site unspecified: Secondary | ICD-10-CM | POA: Diagnosis not present

## 2017-03-23 DIAGNOSIS — M545 Low back pain: Secondary | ICD-10-CM | POA: Diagnosis not present

## 2017-03-23 DIAGNOSIS — M5416 Radiculopathy, lumbar region: Secondary | ICD-10-CM | POA: Diagnosis not present

## 2017-03-25 ENCOUNTER — Telehealth: Payer: Self-pay | Admitting: Endocrinology

## 2017-03-25 MED ORDER — OXYCODONE-ACETAMINOPHEN 10-325 MG PO TABS: 1.0000 | ORAL_TABLET | ORAL | 0 refills | Status: DC | PRN

## 2017-03-25 NOTE — Telephone Encounter (Signed)
Pt called in and needs his pain medication refilled. Please advise.

## 2017-03-25 NOTE — Telephone Encounter (Signed)
I contacted the patient and advised Rx is ready for pick up. Rx placed up front for patient to pick up.

## 2017-03-25 NOTE — Telephone Encounter (Signed)
I printed  

## 2017-03-30 ENCOUNTER — Other Ambulatory Visit: Payer: Self-pay | Admitting: Endocrinology

## 2017-04-06 ENCOUNTER — Ambulatory Visit (INDEPENDENT_AMBULATORY_CARE_PROVIDER_SITE_OTHER): Payer: Medicare Other | Admitting: Endocrinology

## 2017-04-06 ENCOUNTER — Encounter: Payer: Self-pay | Admitting: Endocrinology

## 2017-04-06 VITALS — BP 138/80 | HR 80 | Ht 71.0 in | Wt 212.0 lb

## 2017-04-06 DIAGNOSIS — E119 Type 2 diabetes mellitus without complications: Secondary | ICD-10-CM | POA: Diagnosis not present

## 2017-04-06 DIAGNOSIS — M545 Low back pain: Secondary | ICD-10-CM | POA: Diagnosis not present

## 2017-04-06 LAB — POCT GLYCOSYLATED HEMOGLOBIN (HGB A1C): Hemoglobin A1C: 8.6

## 2017-04-06 MED ORDER — REPAGLINIDE 1 MG PO TABS: 1.0000 mg | ORAL_TABLET | Freq: Three times a day (TID) | ORAL | 11 refills | Status: DC

## 2017-04-06 MED ORDER — DULOXETINE HCL 30 MG PO CPEP: 30.0000 mg | ORAL_CAPSULE | Freq: Every day | ORAL | 11 refills | Status: DC

## 2017-04-06 NOTE — Patient Instructions (Addendum)
I have sent a prescription to your pharmacy, to add "cymbalta."   Please see Dr Vertell Limber as scheduled.   I have sent a prescription to your pharmacy, to double the repaglinide. Please come back for a follow-up appointment in 2 months.

## 2017-04-06 NOTE — Progress Notes (Signed)
Subjective:    Patient ID: Larry Robertson, male    DOB: Aug 16, 1941, 76 y.o.   MRN: [id number]  HPI  Pt returns for f/u of diabetes mellitus:  DM type: 2 Dx'ed: 2010.  Complications: none Therapy: 3 oral meds. DKA: never Severe hypoglycemia: never. Pancreatitis: never Other: edema and nocturia (he no longer takes d-DAVP), limit oral rx options; he has never taken insulin.  Interval history: Pt had appt with neurosurgery in 2 days.  Pain is severe, despite percocet.  He says he did not tolerate robaxin, but does not recall.  He did not tolerate gabapentin (rash).  No assoc numbness.  He says lidocaine patch did not help.   Past Medical History:  Diagnosis Date  . Arthritis   . Blood transfusion without reported diagnosis   . Colon polyps 05/19/2012  . 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 DIABETES MELLITUS, TYPE II 08/25/2007  . Diastolic dysfunction    on echo  . Diverticulosis   . ERECTILE DYSFUNCTION 08/25/2007  . 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 HEPATITIS C 08/25/2007  . HYPERTENSION 08/25/2007  . 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 HYPOGONADISM 09/27/2009    Past Surgical History:  Procedure Laterality Date  . ABDOMINAL HERNIA REPAIR  2000  . CHOLECYSTECTOMY  1999  . COLONOSCOPY    . LUMBAR FUSION  2005  . TONSILLECTOMY AND ADENOIDECTOMY    . UPPER GASTROINTESTINAL ENDOSCOPY      Social History   Social History  . Marital status: Single    Spouse name: N/A  . Number of children: 7  . Years of education: N/A   Occupational History  . 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 Smoking status: Never Smoker  . Smokeless tobacco: Never Used  . Alcohol use No     Comment: 3-4 beers per week; no alcohol per pt as of 02-09-17 visit  . Drug use: No  . Sexual activity: Not on file   Other Topics Concern  . Not on file   Social History Narrative   Divorced    Current Outpatient Prescriptions on File  Prior to Visit  Medication Sig Dispense Refill  . allopurinol (ZYLOPRIM) 300 MG tablet Take 300 mg by mouth daily.      Marland Kitchen amLODipine (NORVASC) 10 MG tablet TAKE 1 TABLET BY MOUTH EVERY DAY 90 tablet 1  . bromocriptine (PARLODEL) 2.5 MG tablet 1/4 tab daily 8 tablet 11  . fluconazole (DIFLUCAN) 100 MG tablet Take 1 tablet (100 mg total) by mouth daily. 14 tablet 0  . losartan (COZAAR) 100 MG tablet TAKE 1 TABLET (100 MG TOTAL) BY MOUTH DAILY. 90 tablet 3  . metFORMIN (GLUCOPHAGE) 1000 MG tablet TAKE 1 TABLET BY MOUTH 2 TIMES DAILY WITH A MEAL. 180 tablet 1  . methocarbamol (ROBAXIN) 500 MG tablet Take 1 tablet (500 mg total) by mouth at bedtime and may repeat dose one time if needed. 10 tablet 0  . oxyCODONE-acetaminophen (PERCOCET) 10-325 MG tablet Take 1 tablet by mouth every 4 (four) hours as needed for pain. 120 tablet 0  . sitaGLIPtin (JANUVIA) 100 MG tablet Take 1 tablet (100 mg total) by mouth daily. 30 tablet 6   Current Facility-Administered Medications on File Prior to Visit  Medication Dose Route Frequency Provider Last Rate Last Dose  . 0.9 %  sodium chloride infusion  500 mL Intravenous Continuous Nandigam, Venia Minks, MD  Allergies  Allergen Reactions  . Gabapentin Rash and Other (See Comments)    And diplopia (double vision)    Family History  Problem Relation Age of Onset  . Diabetes Maternal Grandmother   . Cancer Neg Hx   . Colon cancer Neg Hx   . Esophageal cancer Neg Hx   . Rectal cancer Neg Hx   . Stomach cancer Neg Hx     BP 138/80   Pulse 80   Ht 5\' 11"  (1.803 m)   Wt 212 lb (96.2 kg)   SpO2 96%   BMI 29.57 kg/m    Review of Systems Denies bowel or bladder retention.      Objective:   Physical Exam VITAL SIGNS:  See vs page GENERAL: no distress Pulses: dorsalis pedis intact bilat.   MSK: no deformity of the feet CV: no leg edema Skin:  no ulcer on the feet.  normal color and temp on the feet. Neuro: sensation is intact to touch on the  feet.    a1c=8.3%    Assessment & Plan:  Type 2 DM: worse.  Low back pain, persistent.   Patient Instructions  I have sent a prescription to your pharmacy, to add "cymbalta."   Please see Dr Vertell Limber as scheduled.   I have sent a prescription to your pharmacy, to double the repaglinide. Please come back for a follow-up appointment in 2 months.

## 2017-04-08 DIAGNOSIS — M4316 Spondylolisthesis, lumbar region: Secondary | ICD-10-CM | POA: Diagnosis not present

## 2017-04-08 DIAGNOSIS — M4120 Other idiopathic scoliosis, site unspecified: Secondary | ICD-10-CM | POA: Diagnosis not present

## 2017-04-08 DIAGNOSIS — I10 Essential (primary) hypertension: Secondary | ICD-10-CM | POA: Diagnosis not present

## 2017-04-08 DIAGNOSIS — M545 Low back pain: Secondary | ICD-10-CM | POA: Diagnosis not present

## 2017-04-09 DIAGNOSIS — M5416 Radiculopathy, lumbar region: Secondary | ICD-10-CM | POA: Diagnosis not present

## 2017-04-09 DIAGNOSIS — M4316 Spondylolisthesis, lumbar region: Secondary | ICD-10-CM | POA: Diagnosis not present

## 2017-04-09 DIAGNOSIS — M545 Low back pain: Secondary | ICD-10-CM | POA: Diagnosis not present

## 2017-04-09 DIAGNOSIS — M4120 Other idiopathic scoliosis, site unspecified: Secondary | ICD-10-CM | POA: Diagnosis not present

## 2017-04-09 DIAGNOSIS — I10 Essential (primary) hypertension: Secondary | ICD-10-CM | POA: Diagnosis not present

## 2017-04-24 ENCOUNTER — Telehealth: Payer: Self-pay | Admitting: Endocrinology

## 2017-04-24 MED ORDER — OXYCODONE-ACETAMINOPHEN 10-325 MG PO TABS: 1.0000 | ORAL_TABLET | ORAL | 0 refills | Status: DC | PRN

## 2017-04-24 NOTE — Telephone Encounter (Signed)
I contacted the patient and advised Rx is ready for pick up. Rx placed up front for the patient to pick up.  

## 2017-04-24 NOTE — Telephone Encounter (Signed)
Patient would like a refill on oxyCODONE-acetaminophen (PERCOCET) 10-325 MG tablet  Please call patient when prescription is ready for pick up.

## 2017-04-24 NOTE — Telephone Encounter (Signed)
I printed  

## 2017-04-30 ENCOUNTER — Other Ambulatory Visit: Payer: Self-pay

## 2017-04-30 MED ORDER — DULOXETINE HCL 30 MG PO CPEP: 30.0000 mg | ORAL_CAPSULE | Freq: Every day | ORAL | 3 refills | Status: DC

## 2017-05-04 ENCOUNTER — Other Ambulatory Visit: Payer: Self-pay | Admitting: Endocrinology

## 2017-05-07 DIAGNOSIS — K7469 Other cirrhosis of liver: Secondary | ICD-10-CM | POA: Diagnosis not present

## 2017-05-11 DIAGNOSIS — M545 Low back pain: Secondary | ICD-10-CM | POA: Diagnosis not present

## 2017-05-15 ENCOUNTER — Other Ambulatory Visit: Payer: Self-pay | Admitting: Endocrinology

## 2017-05-20 IMAGING — CT CT ABD-PELV W/ CM
2 of 5 series · 15 of 46 positions shown, 17 images · IV contrast (Omni 300)
Comparison: September 20, 2014

CLINICAL DATA: Abdominal pain and nausea

EXAM:
CT ABDOMEN AND PELVIS WITH CONTRAST
TECHNIQUE: Multidetector CT imaging of the abdomen and pelvis was performed
using the standard protocol following bolus administration of
intravenous contrast.
CONTRAST:  100mL ACQQV0-GBB IOPAMIDOL (ACQQV0-GBB) INJECTION 61%

[Series 4: a/p w/ 5mm · axial · 0.73mm/px · z∈[+748,+1223]mm · 12 of 107 slices shown, 14 images]
[im 6/107  soft-tissue]
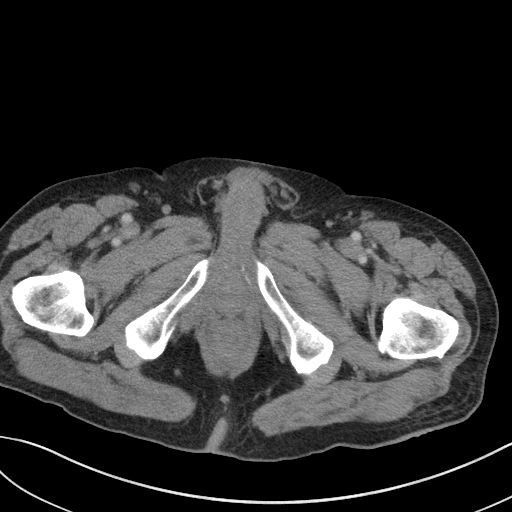
[im 6/107  bone]
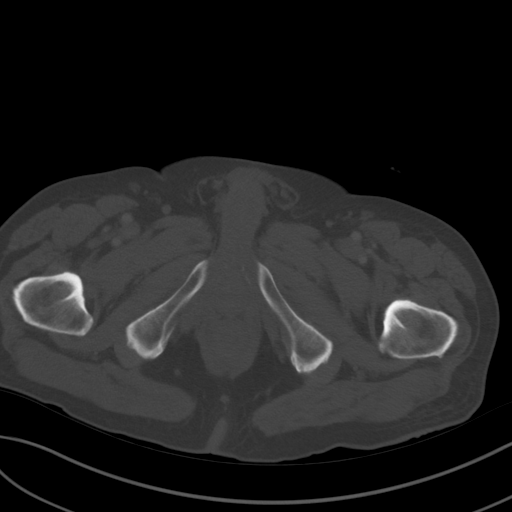
[im 17/107  soft-tissue]
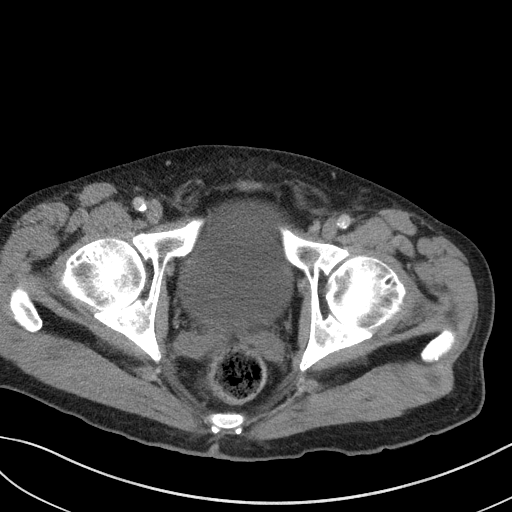
[im 23/107  soft-tissue]
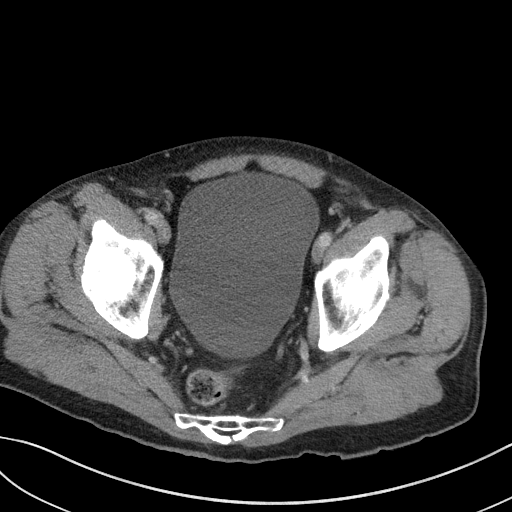
[im 34/107  soft-tissue]
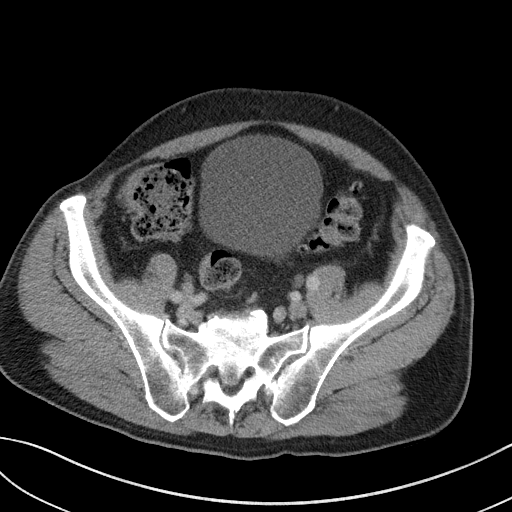
[im 40/107  soft-tissue]
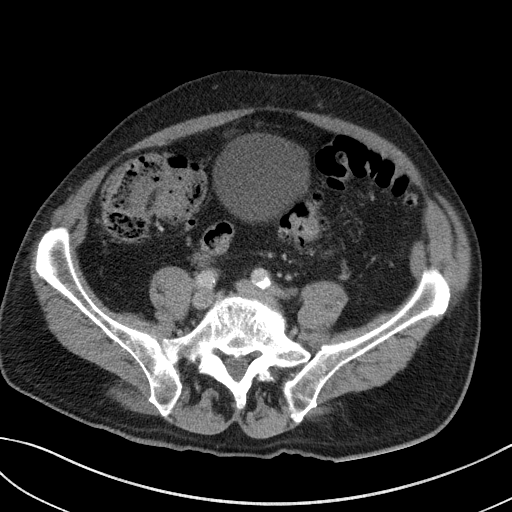
[im 51/107  soft-tissue]
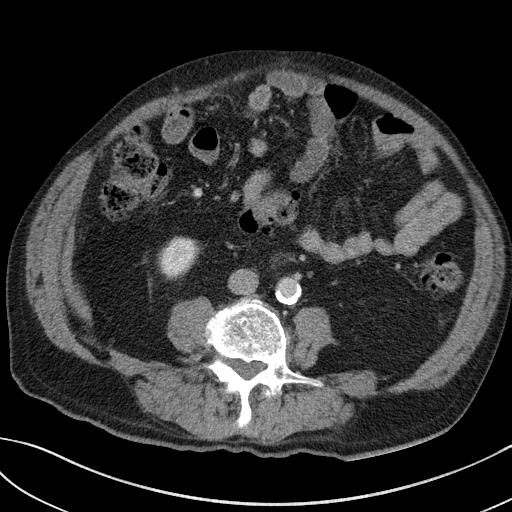
[im 56/107  soft-tissue]
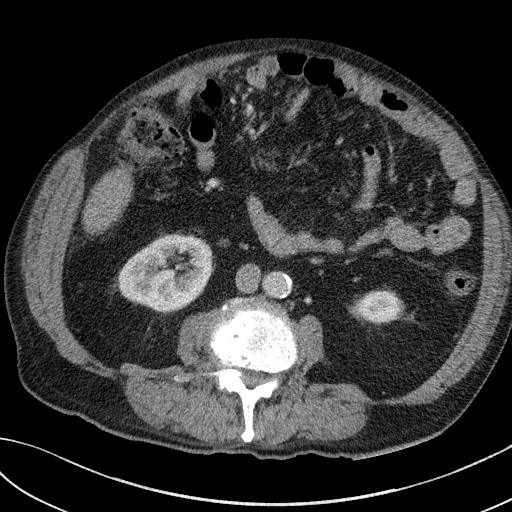
[im 67/107  soft-tissue]
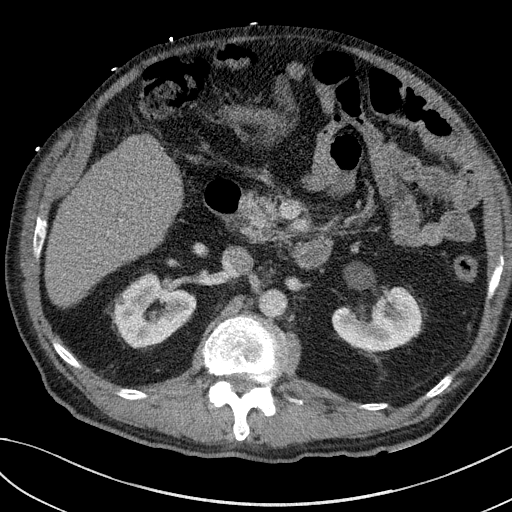
[im 73/107  soft-tissue]
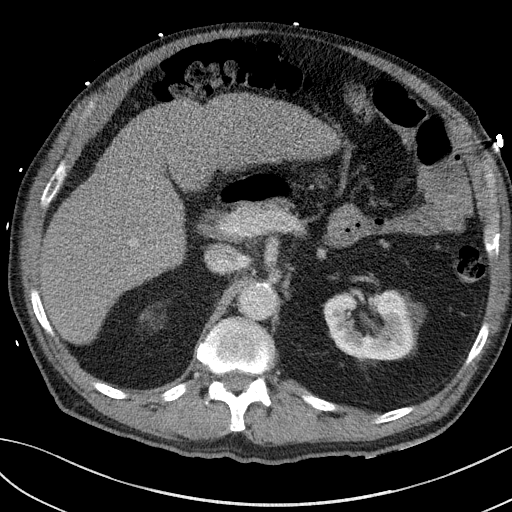
[im 73/107  bone]
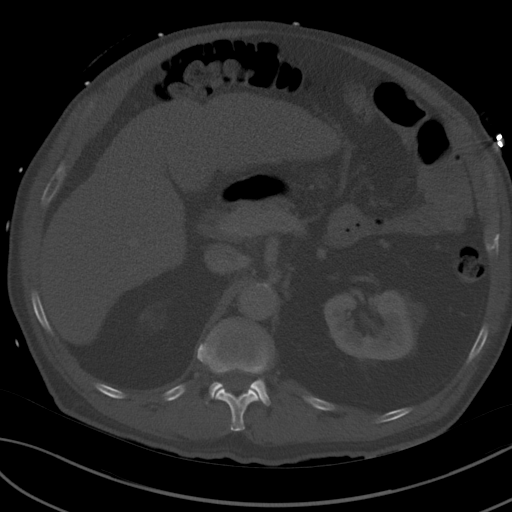
[im 84/107  soft-tissue]
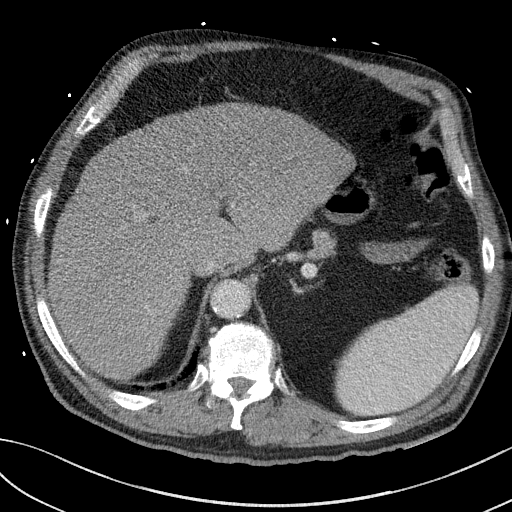
[im 90/107  soft-tissue]
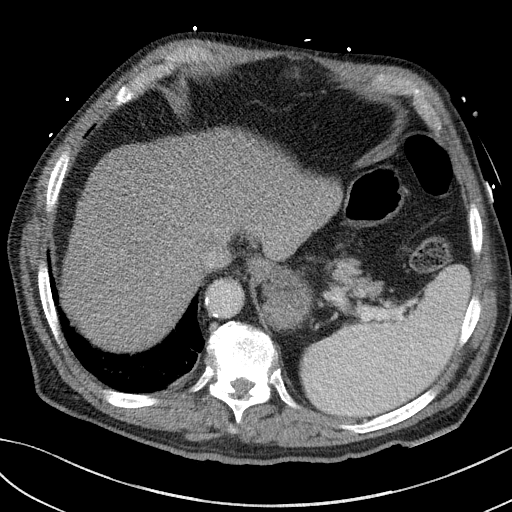
[im 101/107  soft-tissue]
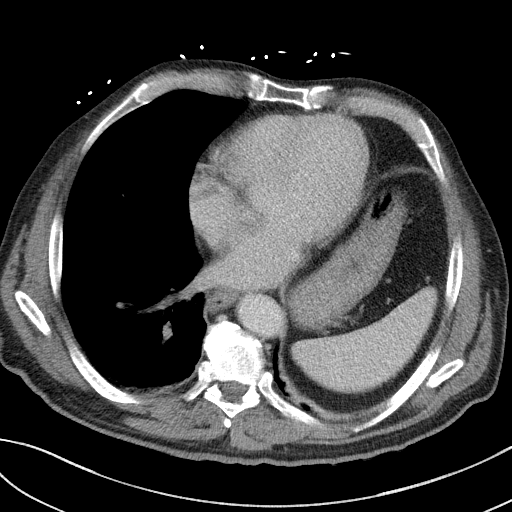

[Series 8: a/p w/ cor · coronal · 0.97mm/px · 3 of 158 slices shown]
[im 53/158  soft-tissue]
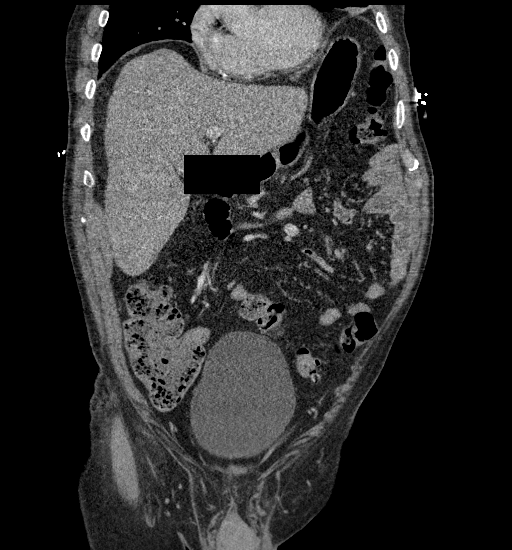
[im 70/158  soft-tissue]
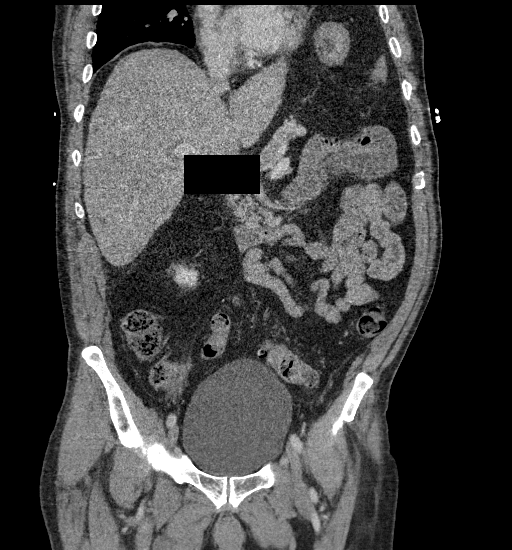
[im 88/158  soft-tissue]
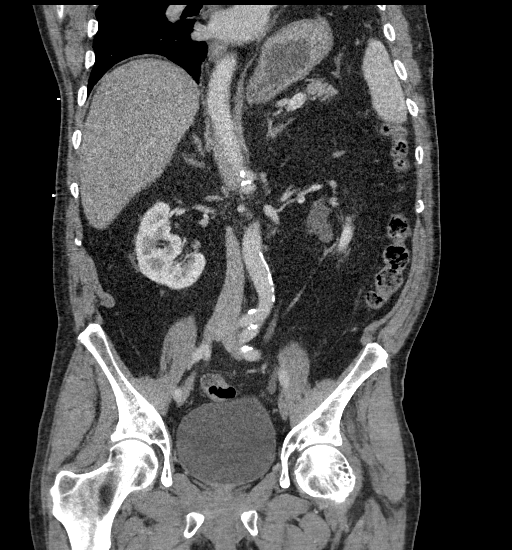

[15 of 46 positions shown; findings below may reference images not displayed]

FINDINGS: Lower chest: There is persistent elevation of the left
hemidiaphragm. There is mild bibasilar scarring and atelectasis. No
lung base edema or consolidation. There are foci of coronary artery
calcification. There is a small hiatal hernia.

Hepatobiliary: Liver measures 21.8 cm in length. Liver contour
appears stable and unremarkable. No focal liver lesions are
apparent. Gallbladder is absent. There is no appreciable biliary
duct dilatation.

Pancreas: No pancreatic mass or inflammatory focus evident.

Spleen: No splenic lesions are evident. Spleen measures 16.2 x
x 6.9 cm with a measured splenic volume of 827 cubic cm.

Adrenals/Urinary Tract: Adrenals appear unremarkable bilaterally.
There is a cyst arising from the lateral upper to mid left kidney
measuring 2.3 x 2.3 cm. There is a cyst arising from the medial mid
right kidney measuring 1.3 x 1.0 cm. There is a prominent extrarenal
pelvis on each side. There is no hydronephrosis on either side.
There is no renal or ureteral calculus on either side. Urinary
bladder is midline with wall thickness within normal limits. Urinary
bladder is somewhat distended.

Stomach/Bowel: There is moderate stool throughout the colon. There
is no appreciable bowel wall or mesenteric thickening. No bowel
obstruction. No free air or portal venous air.

Vascular/Lymphatic: There is atherosclerotic calcification in the
aorta and common iliac arteries. Aorta is ectatic but shows no
evidence of aneurysm. Major mesenteric vessels appear patent. There
is no evident adenopathy in the abdomen or pelvis.

Reproductive: Prostate and seminal vesicles appear normal in size
and contour. No pelvic mass.

Other: Appendix appears normal. There is no ascites or abscess in
the abdomen or pelvis. There is fat in each inguinal ring.

Musculoskeletal: There is extensive degenerative type change in the
lumbar spine. There are no blastic or lytic bone lesions. There is
spinal stenosis at L2-3 and L3-4 and to a lesser extent at L4-5 due
to diffuse disc protrusion coupled with bony hypertrophy. There is
no intramuscular or abdominal wall lesion evident.
IMPRESSION: Hepatomegaly and splenomegaly. No focal liver or splenic lesions are
evident.

Gallbladder absent.

No bowel obstruction.  No abscess.  Appendix appears normal.

No renal or ureteral calculus. No hydronephrosis. Urinary bladder
appears somewhat distended.

There is aortoiliac atherosclerosis. There are foci of coronary
artery calcification.

Spinal stenosis at L2-3, L3-4, and to a slightly lesser extent at
L4-5, multifactorial.

Stable elevation of the left hemidiaphragm.

## 2017-05-22 ENCOUNTER — Telehealth: Payer: Self-pay | Admitting: Endocrinology

## 2017-05-22 MED ORDER — OXYCODONE-ACETAMINOPHEN 10-325 MG PO TABS: 1.0000 | ORAL_TABLET | ORAL | 0 refills | Status: DC | PRN

## 2017-05-22 NOTE — Telephone Encounter (Signed)
**  Remind patient they can make refill requests via MyChart**  Medication refill request (Name & Dosage):  oxyCODONE-acetaminophen (PERCOCET) 10-325 MG tablet  Preferred pharmacy (Name & Address):  [facility]   Other comments (if applicable):   CALL PATIENT ONCE RX IS IN THE FRONT OFFICE FOR PICK UP

## 2017-05-22 NOTE — Telephone Encounter (Signed)
See message. Can you sign for this?

## 2017-05-22 NOTE — Telephone Encounter (Signed)
I can give him 30 tablets until next week.  Pease confirm that he is continuing to have back pain

## 2017-05-22 NOTE — Telephone Encounter (Signed)
Patient confirmed he is continuing to have back pain. Rx placed on your desk to sign.

## 2017-05-26 ENCOUNTER — Telehealth: Payer: Self-pay

## 2017-05-26 ENCOUNTER — Telehealth: Payer: Self-pay | Admitting: Endocrinology

## 2017-05-26 MED ORDER — OXYCODONE-ACETAMINOPHEN 10-325 MG PO TABS: 1.0000 | ORAL_TABLET | ORAL | 0 refills | Status: DC | PRN

## 2017-05-26 NOTE — Telephone Encounter (Signed)
Noted  

## 2017-05-26 NOTE — Telephone Encounter (Signed)
I printed  

## 2017-05-26 NOTE — Telephone Encounter (Signed)
Called and advised that we had refilled the medication, for him to come to the office to pick up prescription. Left call back number if any questions.

## 2017-05-26 NOTE — Telephone Encounter (Signed)
**  Remind patient they can make refill requests via MyChart**  Medication refill request (Name & Dosage): oxyCODONE-acetaminophen (PERCOCET) 10-325 MG tablet [[id number]]     Preferred pharmacy (Name & Address):  N/A, controlled substance.    Other comments (if applicable):

## 2017-05-26 NOTE — Telephone Encounter (Signed)
Please advise if okay to refill? Thank you. 

## 2017-05-27 NOTE — Telephone Encounter (Signed)
Pharmacist Alex notified the dx cose is M54.9 (back pain). He voiced understanding and had no further questions.

## 2017-05-27 NOTE — Telephone Encounter (Signed)
Pharmacist needs dx to fill percocet rx. Please call.

## 2017-06-04 DIAGNOSIS — M545 Low back pain: Secondary | ICD-10-CM | POA: Diagnosis not present

## 2017-06-05 ENCOUNTER — Other Ambulatory Visit: Payer: Self-pay | Admitting: Endocrinology

## 2017-06-08 ENCOUNTER — Ambulatory Visit (INDEPENDENT_AMBULATORY_CARE_PROVIDER_SITE_OTHER): Payer: Medicare Other | Admitting: Endocrinology

## 2017-06-08 VITALS — BP 144/92 | HR 74 | Ht 71.0 in | Wt 208.0 lb

## 2017-06-08 DIAGNOSIS — Z125 Encounter for screening for malignant neoplasm of prostate: Secondary | ICD-10-CM

## 2017-06-08 DIAGNOSIS — Z0001 Encounter for general adult medical examination with abnormal findings: Secondary | ICD-10-CM | POA: Diagnosis not present

## 2017-06-08 DIAGNOSIS — E119 Type 2 diabetes mellitus without complications: Secondary | ICD-10-CM | POA: Diagnosis not present

## 2017-06-08 DIAGNOSIS — I4891 Unspecified atrial fibrillation: Secondary | ICD-10-CM | POA: Diagnosis not present

## 2017-06-08 DIAGNOSIS — M109 Gout, unspecified: Secondary | ICD-10-CM

## 2017-06-08 LAB — LIPID PANEL
Cholesterol: 176 mg/dL (ref 0–200)
HDL: 61.9 mg/dL (ref >39.00)
LDL Cholesterol: 114 mg/dL — ABNORMAL HIGH (ref 0–99)
NonHDL: 114.39
Total CHOL/HDL Ratio: 3
Triglycerides: 1 mg/dL (ref 0.0–149.0)
VLDL: 0.2 mg/dL (ref 0.0–40.0)

## 2017-06-08 LAB — CBC WITH DIFFERENTIAL/PLATELET
Basophils Absolute: 0 10*3/uL (ref 0.0–0.1)
Basophils Relative: 0.2 % (ref 0.0–3.0)
Eosinophils Absolute: 0 10*3/uL (ref 0.0–0.7)
Eosinophils Relative: 0.1 % (ref 0.0–5.0)
HCT: 42.2 % (ref 39.0–52.0)
Hemoglobin: 14.7 g/dL (ref 13.0–17.0)
Lymphocytes Relative: 18.7 % (ref 12.0–46.0)
Lymphs Abs: 1.8 10*3/uL (ref 0.7–4.0)
MCHC: 34.9 g/dL (ref 30.0–36.0)
MCV: 89.6 fl (ref 78.0–100.0)
Monocytes Absolute: 0.7 10*3/uL (ref 0.1–1.0)
Monocytes Relative: 7 % (ref 3.0–12.0)
Neutro Abs: 7.2 10*3/uL (ref 1.4–7.7)
Neutrophils Relative %: 74 % (ref 43.0–77.0)
Platelets: 199 10*3/uL (ref 150.0–400.0)
RBC: 4.71 Mil/uL (ref 4.22–5.81)
RDW: 13.5 % (ref 11.5–15.5)
WBC: 9.8 10*3/uL (ref 4.0–10.5)

## 2017-06-08 LAB — BASIC METABOLIC PANEL
BUN: 18 mg/dL (ref 6–23)
CO2: 24 mEq/L (ref 19–32)
Calcium: 10 mg/dL (ref 8.4–10.5)
Chloride: 101 mEq/L (ref 96–112)
Creatinine, Ser: 1.19 mg/dL (ref 0.40–1.50)
GFR: 62.97 mL/min (ref >60.00)
Glucose, Bld: 178 mg/dL — ABNORMAL HIGH (ref 70–99)
Potassium: 3.7 mEq/L (ref 3.5–5.1)
Sodium: 135 mEq/L (ref 135–145)

## 2017-06-08 LAB — HEPATIC FUNCTION PANEL
ALT: 31 U/L (ref 0–53)
AST: 16 U/L (ref 0–37)
Albumin: 4.6 g/dL (ref 3.5–5.2)
Alkaline Phosphatase: 47 U/L (ref 39–117)
Bilirubin, Direct: 0.2 mg/dL (ref 0.0–0.3)
Total Bilirubin: 0.9 mg/dL (ref 0.2–1.2)
Total Protein: 7.2 g/dL (ref 6.0–8.3)

## 2017-06-08 LAB — MICROALBUMIN / CREATININE URINE RATIO
Creatinine,U: 103.9 mg/dL
Microalb Creat Ratio: 7.7 mg/g (ref 0.0–30.0)
Microalb, Ur: 8 mg/dL — ABNORMAL HIGH (ref 0.0–1.9)

## 2017-06-08 LAB — URINALYSIS, ROUTINE W REFLEX MICROSCOPIC
Bilirubin Urine: NEGATIVE
Hgb urine dipstick: NEGATIVE
Ketones, ur: NEGATIVE
Leukocytes, UA: NEGATIVE
Nitrite: NEGATIVE
RBC / HPF: NONE SEEN (ref 0–?)
Specific Gravity, Urine: 1.015 (ref 1.000–1.030)
Urine Glucose: NEGATIVE
Urobilinogen, UA: 0.2 (ref 0.0–1.0)
pH: 7 (ref 5.0–8.0)

## 2017-06-08 LAB — TSH: TSH: 1.39 u[IU]/mL (ref 0.35–4.50)

## 2017-06-08 LAB — PSA: PSA: 1.91 ng/mL (ref 0.10–4.00)

## 2017-06-08 LAB — URIC ACID: Uric Acid, Serum: 4.9 mg/dL (ref 4.0–7.8)

## 2017-06-08 LAB — POCT GLYCOSYLATED HEMOGLOBIN (HGB A1C): Hemoglobin A1C: 7.4

## 2017-06-08 NOTE — Patient Instructions (Addendum)
Please continue the same medications for diabetes.  However, double the repaglinide for a 4 days after each steroid shot.   Please consider these measures for your health:  minimize alcohol.  Do not use tobacco products.  Have a colonoscopy at least every 10 years from age 76.  Keep firearms safely stored.  Always use seat belts.  have working smoke alarms in your home.  See an eye doctor and dentist regularly.  Never drive under the influence of alcohol or drugs (including prescription drugs).  Those with fair skin should take precautions against the sun, and should carefully examine their skin once per month, for any new or changed moles.   Please come back for a follow-up appointment in 3 months.

## 2017-06-08 NOTE — Progress Notes (Signed)
Subjective:    Patient ID: Larry Robertson, male    DOB: [birth year], 76 y.o.   MRN: [id number]  HPI Pt is here for regular wellness examination, and is feeling pretty well in general, and says chronic med probs are stable, except as noted below.  Past Medical History:  Diagnosis Date  . Arthritis   . Blood transfusion without reported diagnosis   . Colon polyps 05/19/2012  . 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 DIABETES MELLITUS, TYPE II 08/25/2007  . Diastolic dysfunction    on echo  . Diverticulosis   . ERECTILE DYSFUNCTION 08/25/2007  . 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 HEPATITIS C 08/25/2007  . HYPERTENSION 08/25/2007  . 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 HYPOGONADISM 09/27/2009    Past Surgical History:  Procedure Laterality Date  . ABDOMINAL HERNIA REPAIR  2000  . CHOLECYSTECTOMY  1999  . COLONOSCOPY    . LUMBAR FUSION  2005  . TONSILLECTOMY AND ADENOIDECTOMY    . UPPER GASTROINTESTINAL ENDOSCOPY      Social History   Social History  . Marital status: Single    Spouse name: N/A  . Number of children: 7  . Years of education: N/A   Occupational History  . 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 Smoking status: Never Smoker  . Smokeless tobacco: Never Used  . Alcohol use No     Comment: 3-4 beers per week; no alcohol per pt as of 02-09-17 visit  . Drug use: No  . Sexual activity: Not on file   Other Topics Concern  . Not on file   Social History Narrative   Divorced    Current Outpatient Prescriptions on File Prior to Visit  Medication Sig Dispense Refill  . allopurinol (ZYLOPRIM) 300 MG tablet Take 300 mg by mouth daily.      Marland Kitchen amLODipine (NORVASC) 10 MG tablet TAKE 1 TABLET BY MOUTH EVERY DAY 90 tablet 1  . bromocriptine (PARLODEL) 2.5 MG tablet 1/4 tab daily 8 tablet 11  . DULoxetine (CYMBALTA) 30 MG capsule Take 1 capsule (30 mg total) by mouth daily. 90 capsule 3  . fluconazole  (DIFLUCAN) 100 MG tablet Take 1 tablet (100 mg total) by mouth daily. 14 tablet 0  . losartan (COZAAR) 100 MG tablet TAKE 1 TABLET (100 MG TOTAL) BY MOUTH DAILY. 90 tablet 3  . metFORMIN (GLUCOPHAGE) 1000 MG tablet TAKE 1 TABLET BY MOUTH 2 TIMES DAILY WITH A MEAL. 180 tablet 1  . methocarbamol (ROBAXIN) 500 MG tablet Take 1 tablet (500 mg total) by mouth at bedtime and may repeat dose one time if needed. 10 tablet 0  . oxyCODONE-acetaminophen (PERCOCET) 10-325 MG tablet Take 1 tablet by mouth every 4 (four) hours as needed for pain. 100 tablet 0  . repaglinide (PRANDIN) 1 MG tablet Take 1 tablet (1 mg total) by mouth 3 (three) times daily before meals. 90 tablet 11  . sitaGLIPtin (JANUVIA) 100 MG tablet Take 1 tablet (100 mg total) by mouth daily. 30 tablet 6   Current Facility-Administered Medications on File Prior to Visit  Medication Dose Route Frequency Provider Last Rate Last Dose  . 0.9 %  sodium chloride infusion  500 mL Intravenous Continuous Nandigam, Venia Minks, MD        Allergies  Allergen Reactions  . Gabapentin Rash and Other (See Comments)    And diplopia (double vision)    Family History  Problem Relation Age of Onset  . Diabetes Maternal Grandmother   . Cancer Neg Hx   . Colon cancer Neg Hx   . Esophageal cancer Neg Hx   . Rectal cancer Neg Hx   . Stomach cancer Neg Hx     BP (!) 144/92   Pulse 74   Ht 5\' 11"  (1.803 m)   Wt 208 lb (94.3 kg)   SpO2 97%   BMI 29.01 kg/m     Review of Systems  Constitutional: Negative for fever.       He has lost a few lbs  HENT: Negative for hearing loss.   Eyes: Negative for visual disturbance.  Respiratory: Negative for shortness of breath.   Cardiovascular: Negative for chest pain.  Gastrointestinal: Negative for blood in stool.  Endocrine: Negative for cold intolerance.  Genitourinary: Negative for difficulty urinating and hematuria.  Musculoskeletal: Negative for gait problem.  Skin: Negative for rash.    Allergic/Immunologic: Negative for environmental allergies.  Neurological: Negative for numbness.  Hematological: Does not bruise/bleed easily.  Psychiatric/Behavioral: Negative for dysphoric mood.         Objective:   Physical Exam VS: see vs page GEN: no distress HEAD: head: no deformity eyes: no periorbital swelling, no proptosis external nose and ears are normal mouth: no lesion seen NECK: supple, thyroid is not enlarged.  CHEST WALL: no deformity.  LUNGS: clear to auscultation.  BREASTS:  No gynecomastia.  CV: reg rate and rhythm, no murmur.   ABD: abdomen is soft, nontender.  no hepatosplenomegaly.  not distended.  Self-reducing ventral hernia.   RECTAL/PROSTATE: declined.  MUSCULOSKELETAL: muscle bulk and strength are grossly normal.  no obvious joint swelling.  gait is normal and steady.  PULSES: no carotid bruit NEURO:  cn 2-12 grossly intact.   readily moves all 4's.   SKIN:  Normal texture and temperature.  No rash or suspicious lesion is visible.   NODES:  None palpable at the neck.   PSYCH: alert, well-oriented.  Does not appear anxious nor depressed.   I personally reviewed electrocardiogram tracing (today): Indication: DM Impression: NSR.  No MI.  No hypertrophy. Compared to 2016: no significant change     Assessment & Plan:  Wellness visit today, with problems stable, except as noted.     SEPARATE EVALUATION FOLLOWS--EACH PROBLEM HERE IS NEW, NOT RESPONDING TO TREATMENT, OR POSES SIGNIFICANT RISK TO THE PATIENT'S HEALTH: HISTORY OF THE PRESENT ILLNESS:  Pt returns for f/u of diabetes mellitus:  DM type: 2 Dx'ed: 2010.  Complications: none Therapy: 3 oral meds. DKA: never Severe hypoglycemia: never. Pancreatitis: never Other: edema and nocturia (he no longer takes d-DAVP), limit oral rx options; he has never taken insulin.  Interval history: He says lunch is the smallest meal of the day.  He seldom checks cbg's, but says they are in the 200's for a  few days after steroid injections PAST MEDICAL HISTORY Past Medical History:  Diagnosis Date  . Arthritis   . Blood transfusion without reported diagnosis   . Colon polyps 05/19/2012  . 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 DIABETES MELLITUS, TYPE II 08/25/2007  . Diastolic dysfunction    on echo  . Diverticulosis   . ERECTILE DYSFUNCTION 08/25/2007  . 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 HEPATITIS C 08/25/2007  . HYPERTENSION 08/25/2007  . 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 HYPOGONADISM 09/27/2009    Past Surgical History:  Procedure Laterality Date  . ABDOMINAL HERNIA REPAIR  [place]  . COLONOSCOPY    . LUMBAR FUSION  2005  . TONSILLECTOMY AND ADENOIDECTOMY    . UPPER GASTROINTESTINAL ENDOSCOPY      Social History   Social History  . Marital status: Single    Spouse name: N/A  . Number of children: 7  . Years of education: N/A   Occupational History  . 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 Smoking status: Never Smoker  . Smokeless tobacco: Never Used  . Alcohol use No     Comment: 3-4 beers per week; no alcohol per pt as of 02-09-17 visit  . Drug use: No  . Sexual activity: Not on file   Other Topics Concern  . Not on file   Social History Narrative   Divorced    Current Outpatient Prescriptions on File Prior to Visit  Medication Sig Dispense Refill  . allopurinol (ZYLOPRIM) 300 MG tablet Take 300 mg by mouth daily.      Marland Kitchen amLODipine (NORVASC) 10 MG tablet TAKE 1 TABLET BY MOUTH EVERY DAY 90 tablet 1  . bromocriptine (PARLODEL) 2.5 MG tablet 1/4 tab daily 8 tablet 11  . DULoxetine (CYMBALTA) 30 MG capsule Take 1 capsule (30 mg total) by mouth daily. 90 capsule 3  . fluconazole (DIFLUCAN) 100 MG tablet Take 1 tablet (100 mg total) by mouth daily. 14 tablet 0  . losartan (COZAAR) 100 MG tablet TAKE 1 TABLET (100 MG TOTAL) BY MOUTH DAILY. 90 tablet 3  . metFORMIN (GLUCOPHAGE)  1000 MG tablet TAKE 1 TABLET BY MOUTH 2 TIMES DAILY WITH A MEAL. 180 tablet 1  . methocarbamol (ROBAXIN) 500 MG tablet Take 1 tablet (500 mg total) by mouth at bedtime and may repeat dose one time if needed. 10 tablet 0  . oxyCODONE-acetaminophen (PERCOCET) 10-325 MG tablet Take 1 tablet by mouth every 4 (four) hours as needed for pain. 100 tablet 0  . repaglinide (PRANDIN) 1 MG tablet Take 1 tablet (1 mg total) by mouth 3 (three) times daily before meals. 90 tablet 11  . sitaGLIPtin (JANUVIA) 100 MG tablet Take 1 tablet (100 mg total) by mouth daily. 30 tablet 6   Current Facility-Administered Medications on File Prior to Visit  Medication Dose Route Frequency Provider Last Rate Last Dose  . 0.9 %  sodium chloride infusion  500 mL Intravenous Continuous Nandigam, Venia Minks, MD        Allergies  Allergen Reactions  . Gabapentin Rash and Other (See Comments)    And diplopia (double vision)    Family History  Problem Relation Age of Onset  . Diabetes Maternal Grandmother   . Cancer Neg Hx   . Colon cancer Neg Hx   . Esophageal cancer Neg Hx   . Rectal cancer Neg Hx   . Stomach cancer Neg Hx     BP (!) 144/92   Pulse 74   Ht 5\' 11"  (1.803 m)   Wt 208 lb (94.3 kg)   SpO2 97%   BMI 29.01 kg/m   REVIEW OF SYSTEMS: He denies hypoglycemia.  Low-back pain is improved with injections. PHYSICAL EXAMINATION: VITAL SIGNS:  See vs page GENERAL: no distress Pulses: dorsalis pedis intact bilat.   MSK: no deformity of the feet CV: no leg edema, but there are bilat ankle vv's.  Skin:  no ulcer on the feet.  normal color and temp on the feet. Neuro: sensation is intact to  touch on the feet LAB/XRAY RESULTS:  A1c=7.4% Lab Results  Component Value Date   CREATININE 1.19 06/08/2017   BUN 18 06/08/2017   NA 135 06/08/2017   K 3.7 06/08/2017   CL 101 06/08/2017   CO2 24 06/08/2017  IMPRESSION:  Type 2 DM: he needs increased rx, if it can be done with a regimen that avoids or minimizes  hypoglycemia.  Back pain: steroid shots are affecting a1c.  He requests to increase quantity of vicodin--I declined, for safety reasons. HTN: poss situational component.  Please continue the same medication.  Recheck next time.   PLAN:  Please continue the same medications for diabetes.  However, double the repaglinide for a 4 days after each steroid shot.

## 2017-06-23 ENCOUNTER — Telehealth: Payer: Self-pay | Admitting: Endocrinology

## 2017-06-23 MED ORDER — OXYCODONE-ACETAMINOPHEN 10-325 MG PO TABS: 1.0000 | ORAL_TABLET | ORAL | 0 refills | Status: DC | PRN

## 2017-06-23 NOTE — Telephone Encounter (Signed)
I printed  

## 2017-06-23 NOTE — Telephone Encounter (Signed)
Faxed over

## 2017-06-23 NOTE — Telephone Encounter (Signed)
**  Remind patient they can make refill requests via MyChart**  Medication refill request (Name & Dosage):  oxyCODONE-acetaminophen (PERCOCET) 10-325 MG tablet [[id number]]   Preferred pharmacy (Name & Address):  N/A, controlled substance.   Other comments (if applicable):

## 2017-06-24 ENCOUNTER — Telehealth: Payer: Self-pay

## 2017-06-24 NOTE — Telephone Encounter (Signed)
Called patient and told him prescription was ready for pick up at front desk.

## 2017-07-06 DIAGNOSIS — M4120 Other idiopathic scoliosis, site unspecified: Secondary | ICD-10-CM | POA: Diagnosis not present

## 2017-07-06 DIAGNOSIS — M545 Low back pain: Secondary | ICD-10-CM | POA: Diagnosis not present

## 2017-07-06 DIAGNOSIS — M4316 Spondylolisthesis, lumbar region: Secondary | ICD-10-CM | POA: Diagnosis not present

## 2017-07-06 DIAGNOSIS — M5416 Radiculopathy, lumbar region: Secondary | ICD-10-CM | POA: Diagnosis not present

## 2017-07-17 ENCOUNTER — Telehealth: Payer: Self-pay | Admitting: Endocrinology

## 2017-07-17 NOTE — Telephone Encounter (Signed)
Patient stated he would call back to pick up prescription closer to the 1st.

## 2017-07-17 NOTE — Telephone Encounter (Signed)
please call patient: Not due yet.   Next refill due 08/01/17

## 2017-07-17 NOTE — Telephone Encounter (Signed)
Okay to refill.  Thanks.

## 2017-07-17 NOTE — Telephone Encounter (Signed)
MEDICATION: oxyCODONE-acetaminophen (PERCOCET) 10-325 MG tablet  PHARMACY: END[facility]CRINOLOGY    IS THIS A 90 DAY SUPPLY : NO  IS PATIENT OUT OF MEDICTAION: YES  IF NOT; HOW MUCH IS LEFT: N/A  LAST APPOINTMENT DATE:06/08/17  NEXT APPOINTMENT DATE:09/08/17  OTHER COMMENTS:    **Let patient know to contact pharmacy at the end of the day to make sure medication is ready. **  ** Please notify patient to allow 48-72 hours to process**  **Encourage patient to contact the pharmacy for refills or they can request refills through [facility]**

## 2017-07-20 ENCOUNTER — Ambulatory Visit: Payer: Medicare Other | Admitting: Internal Medicine

## 2017-07-21 ENCOUNTER — Other Ambulatory Visit: Payer: Self-pay

## 2017-07-21 MED ORDER — DULOXETINE HCL 30 MG PO CPEP: 30.0000 mg | ORAL_CAPSULE | Freq: Every day | ORAL | 3 refills | Status: DC

## 2017-07-21 MED ORDER — REPAGLINIDE 1 MG PO TABS: 1.0000 mg | ORAL_TABLET | Freq: Three times a day (TID) | ORAL | 11 refills | Status: DC

## 2017-07-22 ENCOUNTER — Other Ambulatory Visit: Payer: Self-pay

## 2017-07-22 MED ORDER — DULOXETINE HCL 30 MG PO CPEP: 30.0000 mg | ORAL_CAPSULE | Freq: Every day | ORAL | 3 refills | Status: DC

## 2017-07-28 ENCOUNTER — Other Ambulatory Visit: Payer: Self-pay

## 2017-07-28 ENCOUNTER — Telehealth: Payer: Self-pay | Admitting: Endocrinology

## 2017-07-28 MED ORDER — OXYCODONE-ACETAMINOPHEN 10-325 MG PO TABS: 1.0000 | ORAL_TABLET | ORAL | 0 refills | Status: DC | PRN

## 2017-07-28 NOTE — Telephone Encounter (Signed)
MEDICATION: oxyCODONE-acetaminophen (PERCOCET) 10-325 MG tablet Patient also needs gout medication (patient could not recall name of Rx)  PHARMACY:    [id number] - [place], [place]. [phone] (Phone) [phone] (Fax)     IS THIS A 90 DAY SUPPLY : N/A  IS PATIENT OUT OF MEDICATION: Y  IF NOT; HOW MUCH IS LEFT:   LAST APPOINTMENT DATE:  NEXT APPOINTMENT DATE:  OTHER COMMENTS:    **Let patient know to contact pharmacy at the end of the day to make sure medication is ready. **  ** Please notify patient to allow 48-72 hours to process**  **Encourage patient to contact the pharmacy for refills or they can request refills through [facility]**

## 2017-07-29 NOTE — Telephone Encounter (Signed)
Pt is here to get his pain meds but is asking for a rx for gout please it is flaring up on him now

## 2017-07-29 NOTE — Telephone Encounter (Signed)
Tomorrow 4:15 PM

## 2017-07-29 NOTE — Telephone Encounter (Signed)
Patient stated that he was good & that prescription was sent to his pharmacy.

## 2017-07-30 ENCOUNTER — Telehealth: Payer: Self-pay

## 2017-07-30 ENCOUNTER — Ambulatory Visit (INDEPENDENT_AMBULATORY_CARE_PROVIDER_SITE_OTHER): Payer: Medicare Other | Admitting: Endocrinology

## 2017-07-30 ENCOUNTER — Encounter: Payer: Self-pay | Admitting: Endocrinology

## 2017-07-30 VITALS — BP 130/80 | HR 77 | Wt 210.8 lb

## 2017-07-30 DIAGNOSIS — M109 Gout, unspecified: Secondary | ICD-10-CM | POA: Diagnosis not present

## 2017-07-30 DIAGNOSIS — K7469 Other cirrhosis of liver: Secondary | ICD-10-CM | POA: Diagnosis not present

## 2017-07-30 MED ORDER — COLCHICINE 0.6 MG PO TABS: ORAL_TABLET | ORAL | 4 refills | Status: DC

## 2017-07-30 NOTE — Telephone Encounter (Signed)
Called patient to confirm appt at 4:15 but left VM to call back.

## 2017-07-30 NOTE — Patient Instructions (Addendum)
I have sent a prescription to your pharmacy, for the gout. Due to an interaction, avoid fluconazole on day you take this.      Gout Gout is painful swelling that can occur in some of your joints. Gout is a type of arthritis. This condition is caused by having too much uric acid in your body. Uric acid is a chemical that forms when your body breaks down substances called purines. Purines are important for building body proteins. When your body has too much uric acid, sharp crystals can form and build up inside your joints. This causes pain and swelling. Gout attacks can happen quickly and be very painful (acute gout). Over time, the attacks can affect more joints and become more frequent (chronic gout). Gout can also cause uric acid to build up under your skin and inside your kidneys. What are the causes? This condition is caused by too much uric acid in your blood. This can occur because:  Your kidneys do not remove enough uric acid from your blood. This is the most common cause.  Your body makes too much uric acid. This can occur with some cancers and cancer treatments. It can also occur if your body is breaking down too many red blood cells (hemolytic anemia).  You eat too many foods that are high in purines. These foods include organ meats and some seafood. Alcohol, especially beer, is also high in purines.  A gout attack may be triggered by trauma or stress. What increases the risk? This condition is more likely to develop in people who:  Have a family history of gout.  Are male and middle-aged.  Are male and have gone through menopause.  Are obese.  Frequently drink alcohol, especially beer.  Are dehydrated.  Lose weight too quickly.  Have an organ transplant.  Have lead poisoning.  Take certain medicines, including aspirin, cyclosporine, diuretics, levodopa, and niacin.  Have kidney disease or psoriasis.  What are the signs or symptoms? An attack of acute gout  happens quickly. It usually occurs in just one joint. The most common place is the big toe. Attacks often start at night. Other joints that may be affected include joints of the feet, ankle, knee, fingers, wrist, or elbow. Symptoms may include:  Severe pain.  Warmth.  Swelling.  Stiffness.  Tenderness. The affected joint may be very painful to touch.  Shiny, red, or purple skin.  Chills and fever.  Chronic gout may cause symptoms more frequently. More joints may be involved. You may also have white or yellow lumps (tophi) on your hands or feet or in other areas near your joints. How is this diagnosed? This condition is diagnosed based on your symptoms, medical history, and physical exam. You may have tests, such as:  Blood tests to measure uric acid levels.  Removal of joint fluid with a needle (aspiration) to look for uric acid crystals.  X-rays to look for joint damage.  How is this treated? Treatment for this condition has two phases: treating an acute attack and preventing future attacks. Acute gout treatment may include medicines to reduce pain and swelling, including:  NSAIDs.  Steroids. These are strong anti-inflammatory medicines that can be taken by mouth (orally) or injected into a joint.  Colchicine. This medicine relieves pain and swelling when it is taken soon after an attack. It can be given orally or through an IV tube.  Preventive treatment may include:  Daily use of smaller doses of NSAIDs or colchicine.  Use  of a medicine that reduces uric acid levels in your blood.  Changes to your diet. You may need to see a specialist about healthy eating (dietitian).  Follow these instructions at home: During a Gout Attack  If directed, apply ice to the affected area: ? Put ice in a plastic bag. ? Place a towel between your skin and the bag. ? Leave the ice on for 20 minutes, 2-3 times a day.  Rest the joint as much as possible. If the affected joint is in your  leg, you may be given crutches to use.  Raise (elevate) the affected joint above the level of your heart as often as possible.  Drink enough fluids to keep your urine clear or pale yellow.  Take over-the-counter and prescription medicines only as told by your health care provider.  Do not drive or operate heavy machinery while taking prescription pain medicine.  Follow instructions from your health care provider about eating or drinking restrictions.  Return to your normal activities as told by your health care provider. Ask your health care provider what activities are safe for you. Avoiding Future Gout Attacks  Follow a low-purine diet as told by your dietitian or health care provider. Avoid foods and drinks that are high in purines, including liver, kidney, anchovies, asparagus, herring, mushrooms, mussels, and beer.  Limit alcohol intake to no more than 1 drink a day for nonpregnant women and 2 drinks a day for men. One drink equals 12 oz of beer, 5 oz of wine, or 1 oz of hard liquor.  Maintain a healthy weight or lose weight if you are overweight. If you want to lose weight, talk with your health care provider. It is important that you do not lose weight too quickly.  Start or maintain an exercise program as told by your health care provider.  Drink enough fluids to keep your urine clear or pale yellow.  Take over-the-counter and prescription medicines only as told by your health care provider.  Keep all follow-up visits as told by your health care provider. This is important. Contact a health care provider if:  You have another gout attack.  You continue to have symptoms of a gout attack after10 days of treatment.  You have side effects from your medicines.  You have chills or a fever.  You have burning pain when you urinate.  You have pain in your lower back or belly. Get help right away if:  You have severe or uncontrolled pain.  You cannot urinate. This  information is not intended to replace advice given to you by your health care provider. Make sure you discuss any questions you have with your health care provider. Document Released: 11/14/2000 Document Revised: 04/24/2016 Document Reviewed: 08/30/2015 Elsevier Interactive Patient Education  2017 [employer].

## 2017-07-30 NOTE — Progress Notes (Signed)
Subjective:    Patient ID: Larry Robertson, male    DOB: 03/06/1941, 76 y.o.   MRN: [id number]  HPI Pt states few days of severe pain at the instep of the left foot, and assoc swelling.  sxs are improved today.  Past Medical History:  Diagnosis Date  . Arthritis   . Blood transfusion without reported diagnosis   . Colon polyps 05/19/2012  . 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 DIABETES MELLITUS, TYPE II 08/25/2007  . Diastolic dysfunction    on echo  . Diverticulosis   . ERECTILE DYSFUNCTION 08/25/2007  . 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 HEPATITIS C 08/25/2007  . HYPERTENSION 08/25/2007  . 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 HYPOGONADISM 09/27/2009    Past Surgical History:  Procedure Laterality Date  . ABDOMINAL HERNIA REPAIR  2000  . CHOLECYSTECTOMY  1999  . COLONOSCOPY    . LUMBAR FUSION  2005  . TONSILLECTOMY AND ADENOIDECTOMY    . UPPER GASTROINTESTINAL ENDOSCOPY      Social History   Social History  . Marital status: Single    Spouse name: N/A  . Number of children: 7  . Years of education: N/A   Occupational History  . 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 Smoking status: Never Smoker  . Smokeless tobacco: Never Used  . Alcohol use No     Comment: 3-4 beers per week; no alcohol per pt as of 02-09-17 visit  . Drug use: No  . Sexual activity: Not on file   Other Topics Concern  . Not on file   Social History Narrative   Divorced    Current Outpatient Prescriptions on File Prior to Visit  Medication Sig Dispense Refill  . allopurinol (ZYLOPRIM) 300 MG tablet Take 300 mg by mouth daily.      Marland Kitchen amLODipine (NORVASC) 10 MG tablet TAKE 1 TABLET BY MOUTH EVERY DAY 90 tablet 1  . bromocriptine (PARLODEL) 2.5 MG tablet 1/4 tab daily 8 tablet 11  . DULoxetine (CYMBALTA) 30 MG capsule Take 1 capsule (30 mg total) by mouth daily. 90 capsule 3  . losartan (COZAAR) 100 MG tablet TAKE 1 TABLET (100 MG  TOTAL) BY MOUTH DAILY. 90 tablet 3  . metFORMIN (GLUCOPHAGE) 1000 MG tablet TAKE 1 TABLET BY MOUTH 2 TIMES DAILY WITH A MEAL. 180 tablet 1  . oxyCODONE-acetaminophen (PERCOCET) 10-325 MG tablet Take 1 tablet by mouth every 4 (four) hours as needed for pain. 100 tablet 0  . repaglinide (PRANDIN) 1 MG tablet Take 1 tablet (1 mg total) by mouth 3 (three) times daily before meals. 90 tablet 11  . sitaGLIPtin (JANUVIA) 100 MG tablet Take 1 tablet (100 mg total) by mouth daily. 30 tablet 6  . fluconazole (DIFLUCAN) 100 MG tablet Take 1 tablet (100 mg total) by mouth daily. (Patient not taking: Reported on 07/30/2017) 14 tablet 0  . methocarbamol (ROBAXIN) 500 MG tablet Take 1 tablet (500 mg total) by mouth at bedtime and may repeat dose one time if needed. (Patient not taking: Reported on 07/30/2017) 10 tablet 0   Current Facility-Administered Medications on File Prior to Visit  Medication Dose Route Frequency Provider Last Rate Last Dose  . 0.9 %  sodium chloride infusion  500 mL Intravenous Continuous Nandigam, Venia Minks, MD        Allergies  Allergen Reactions  . Gabapentin Rash and Other (See Comments)  And diplopia (double vision)    Family History  Problem Relation Age of Onset  . Diabetes Maternal Grandmother   . Cancer Neg Hx   . Colon cancer Neg Hx   . Esophageal cancer Neg Hx   . Rectal cancer Neg Hx   . Stomach cancer Neg Hx     BP 130/80   Pulse 77   Wt 210 lb 12.8 oz (95.6 kg)   SpO2 96%   BMI 29.40 kg/m    Review of Systems  Denies fever and rash.     Objective:   Physical Exam VITAL SIGNS:  See vs page.  GENERAL: no distress.  Left foot: no tend/swell/erythema/warmth.      Assessment & Plan:  Gout, recurrent   Patient Instructions  I have sent a prescription to your pharmacy, for the gout. Due to an interaction, avoid fluconazole on day you take this.      Gout Gout is painful swelling that can occur in some of your joints. Gout is a type of  arthritis. This condition is caused by having too much uric acid in your body. Uric acid is a chemical that forms when your body breaks down substances called purines. Purines are important for building body proteins. When your body has too much uric acid, sharp crystals can form and build up inside your joints. This causes pain and swelling. Gout attacks can happen quickly and be very painful (acute gout). Over time, the attacks can affect more joints and become more frequent (chronic gout). Gout can also cause uric acid to build up under your skin and inside your kidneys. What are the causes? This condition is caused by too much uric acid in your blood. This can occur because:  Your kidneys do not remove enough uric acid from your blood. This is the most common cause.  Your body makes too much uric acid. This can occur with some cancers and cancer treatments. It can also occur if your body is breaking down too many red blood cells (hemolytic anemia).  You eat too many foods that are high in purines. These foods include organ meats and some seafood. Alcohol, especially beer, is also high in purines.  A gout attack may be triggered by trauma or stress. What increases the risk? This condition is more likely to develop in people who:  Have a family history of gout.  Are male and middle-aged.  Are male and have gone through menopause.  Are obese.  Frequently drink alcohol, especially beer.  Are dehydrated.  Lose weight too quickly.  Have an organ transplant.  Have lead poisoning.  Take certain medicines, including aspirin, cyclosporine, diuretics, levodopa, and niacin.  Have kidney disease or psoriasis.  What are the signs or symptoms? An attack of acute gout happens quickly. It usually occurs in just one joint. The most common place is the big toe. Attacks often start at night. Other joints that may be affected include joints of the feet, ankle, knee, fingers, wrist, or elbow.  Symptoms may include:  Severe pain.  Warmth.  Swelling.  Stiffness.  Tenderness. The affected joint may be very painful to touch.  Shiny, red, or purple skin.  Chills and fever.  Chronic gout may cause symptoms more frequently. More joints may be involved. You may also have white or yellow lumps (tophi) on your hands or feet or in other areas near your joints. How is this diagnosed? This condition is diagnosed based on your symptoms, medical history, and  physical exam. You may have tests, such as:  Blood tests to measure uric acid levels.  Removal of joint fluid with a needle (aspiration) to look for uric acid crystals.  X-rays to look for joint damage.  How is this treated? Treatment for this condition has two phases: treating an acute attack and preventing future attacks. Acute gout treatment may include medicines to reduce pain and swelling, including:  NSAIDs.  Steroids. These are strong anti-inflammatory medicines that can be taken by mouth (orally) or injected into a joint.  Colchicine. This medicine relieves pain and swelling when it is taken soon after an attack. It can be given orally or through an IV tube.  Preventive treatment may include:  Daily use of smaller doses of NSAIDs or colchicine.  Use of a medicine that reduces uric acid levels in your blood.  Changes to your diet. You may need to see a specialist about healthy eating (dietitian).  Follow these instructions at home: During a Gout Attack  If directed, apply ice to the affected area: ? Put ice in a plastic bag. ? Place a towel between your skin and the bag. ? Leave the ice on for 20 minutes, 2-3 times a day.  Rest the joint as much as possible. If the affected joint is in your leg, you may be given crutches to use.  Raise (elevate) the affected joint above the level of your heart as often as possible.  Drink enough fluids to keep your urine clear or pale yellow.  Take over-the-counter and  prescription medicines only as told by your health care provider.  Do not drive or operate heavy machinery while taking prescription pain medicine.  Follow instructions from your health care provider about eating or drinking restrictions.  Return to your normal activities as told by your health care provider. Ask your health care provider what activities are safe for you. Avoiding Future Gout Attacks  Follow a low-purine diet as told by your dietitian or health care provider. Avoid foods and drinks that are high in purines, including liver, kidney, anchovies, asparagus, herring, mushrooms, mussels, and beer.  Limit alcohol intake to no more than 1 drink a day for nonpregnant women and 2 drinks a day for men. One drink equals 12 oz of beer, 5 oz of wine, or 1 oz of hard liquor.  Maintain a healthy weight or lose weight if you are overweight. If you want to lose weight, talk with your health care provider. It is important that you do not lose weight too quickly.  Start or maintain an exercise program as told by your health care provider.  Drink enough fluids to keep your urine clear or pale yellow.  Take over-the-counter and prescription medicines only as told by your health care provider.  Keep all follow-up visits as told by your health care provider. This is important. Contact a health care provider if:  You have another gout attack.  You continue to have symptoms of a gout attack after10 days of treatment.  You have side effects from your medicines.  You have chills or a fever.  You have burning pain when you urinate.  You have pain in your lower back or belly. Get help right away if:  You have severe or uncontrolled pain.  You cannot urinate. This information is not intended to replace advice given to you by your health care provider. Make sure you discuss any questions you have with your health care provider. Document Released: 11/14/2000 Document Revised:  04/24/2016  Document Reviewed: 08/30/2015 Elsevier Interactive Patient Education  2017 [employer].

## 2017-07-30 NOTE — Telephone Encounter (Signed)
Patient still wants to come in today at 4:15, he is not on the schedule. Call patient to advise as soon as possible.

## 2017-07-31 ENCOUNTER — Other Ambulatory Visit: Payer: Self-pay | Admitting: Nurse Practitioner

## 2017-07-31 DIAGNOSIS — K7469 Other cirrhosis of liver: Secondary | ICD-10-CM

## 2017-08-12 ENCOUNTER — Other Ambulatory Visit: Payer: Self-pay

## 2017-08-15 ENCOUNTER — Other Ambulatory Visit: Payer: Self-pay | Admitting: Endocrinology

## 2017-08-20 DIAGNOSIS — M545 Low back pain: Secondary | ICD-10-CM | POA: Diagnosis not present

## 2017-08-20 DIAGNOSIS — M4316 Spondylolisthesis, lumbar region: Secondary | ICD-10-CM | POA: Diagnosis not present

## 2017-08-27 ENCOUNTER — Other Ambulatory Visit: Payer: Self-pay

## 2017-08-27 ENCOUNTER — Telehealth: Payer: Self-pay | Admitting: Endocrinology

## 2017-08-27 MED ORDER — OXYCODONE-ACETAMINOPHEN 10-325 MG PO TABS: 1.0000 | ORAL_TABLET | ORAL | 0 refills | Status: DC | PRN

## 2017-08-27 NOTE — Telephone Encounter (Signed)
I printed  

## 2017-08-27 NOTE — Telephone Encounter (Signed)
MEDICATION: oxyCODONE-acetaminophen (PERCOCET) 10-325 MG tablet  PHARMACY:    IS THIS A 90 DAY SUPPLY : N/A  IS PATIENT OUT OF MEDICATION: Y  IF NOT; HOW MUCH IS LEFT:   LAST APPOINTMENT DATE: 07/30/17  NEXT APPOINTMENT DATE: 09/08/17  OTHER COMMENTS: please call patient and advise when ready   **Let patient know to contact pharmacy at the end of the day to make sure medication is ready. **  ** Please notify patient to allow 48-72 hours to process**  **Encourage patient to contact the pharmacy for refills or they can request refills through [facility]**

## 2017-09-07 ENCOUNTER — Other Ambulatory Visit: Payer: Self-pay

## 2017-09-07 MED ORDER — SITAGLIPTIN PHOSPHATE 100 MG PO TABS: 100.0000 mg | ORAL_TABLET | Freq: Every day | ORAL | 3 refills | Status: DC

## 2017-09-08 ENCOUNTER — Encounter: Payer: Self-pay | Admitting: Endocrinology

## 2017-09-08 ENCOUNTER — Ambulatory Visit (INDEPENDENT_AMBULATORY_CARE_PROVIDER_SITE_OTHER): Payer: Medicare Other | Admitting: Endocrinology

## 2017-09-08 VITALS — BP 122/78 | HR 85 | Wt 214.4 lb

## 2017-09-08 DIAGNOSIS — E119 Type 2 diabetes mellitus without complications: Secondary | ICD-10-CM

## 2017-09-08 DIAGNOSIS — Z23 Encounter for immunization: Secondary | ICD-10-CM

## 2017-09-08 DIAGNOSIS — M545 Low back pain, unspecified: Secondary | ICD-10-CM

## 2017-09-08 LAB — POCT GLYCOSYLATED HEMOGLOBIN (HGB A1C): Hemoglobin A1C: 7.1

## 2017-09-08 MED ORDER — DULOXETINE HCL 60 MG PO CPEP: 60.0000 mg | ORAL_CAPSULE | Freq: Every day | ORAL | 11 refills | Status: DC

## 2017-09-08 NOTE — Progress Notes (Signed)
Subjective:    Patient ID: Larry Robertson, male    DOB: [birth year], 76 y.o.   MRN: [id number]  HPI Pt returns for f/u of diabetes mellitus:  DM type: 2 Dx'ed: 2010.  Complications: none.  Therapy: 3 oral meds.  DKA: never.  Severe hypoglycemia: never.  Pancreatitis: never.  Other: edema and nocturia (he no longer takes d-DAVP), limit oral rx options; he has never taken insulin.   Interval history: he does not check cbg's.  neurosurgery said surg would not help.  Pt has also recently received steroid back injections, but pt says these did not help, either.   Past Medical History:  Diagnosis Date  . Arthritis   . Blood transfusion without reported diagnosis   . Colon polyps 05/19/2012  . 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 DIABETES MELLITUS, TYPE II 08/25/2007  . Diastolic dysfunction    on echo  . Diverticulosis   . ERECTILE DYSFUNCTION 08/25/2007  . 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 HEPATITIS C 08/25/2007  . HYPERTENSION 08/25/2007  . 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 HYPOGONADISM 09/27/2009    Past Surgical History:  Procedure Laterality Date  . ABDOMINAL HERNIA REPAIR  2000  . CHOLECYSTECTOMY  1999  . COLONOSCOPY    . LUMBAR FUSION  2005  . TONSILLECTOMY AND ADENOIDECTOMY    . UPPER GASTROINTESTINAL ENDOSCOPY      Social History   Social History  . Marital status: Single    Spouse name: N/A  . Number of children: 7  . Years of education: N/A   Occupational History  . 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 Smoking status: Never Smoker  . Smokeless tobacco: Never Used  . Alcohol use No     Comment: 3-4 beers per week; no alcohol per pt as of 02-09-17 visit  . Drug use: No  . Sexual activity: Not on file   Other Topics Concern  . Not on file   Social History Narrative   Divorced    Current Outpatient Prescriptions on File Prior to Visit  Medication Sig Dispense Refill  . allopurinol  (ZYLOPRIM) 300 MG tablet Take 300 mg by mouth daily.      Marland Kitchen amLODipine (NORVASC) 10 MG tablet TAKE 1 TABLET BY MOUTH EVERY DAY 90 tablet 1  . bromocriptine (PARLODEL) 2.5 MG tablet 1/4 tab daily 8 tablet 11  . colchicine 0.6 MG tablet 1 tablet every hour as needed for gout, not to exceed 6/day 6 tablet 4  . fluconazole (DIFLUCAN) 100 MG tablet Take 1 tablet (100 mg total) by mouth daily. 14 tablet 0  . losartan (COZAAR) 100 MG tablet TAKE 1 TABLET (100 MG TOTAL) BY MOUTH DAILY. 90 tablet 3  . metFORMIN (GLUCOPHAGE) 1000 MG tablet TAKE 1 TABLET BY MOUTH 2 TIMES DAILY WITH A MEAL. (Patient taking differently: TAKE 1 TABLET BY MOUTH 3 TIMES DAILY WITH A MEAL.) 180 tablet 1  . methocarbamol (ROBAXIN) 500 MG tablet Take 1 tablet (500 mg total) by mouth at bedtime and may repeat dose one time if needed. 10 tablet 0  . oxyCODONE-acetaminophen (PERCOCET) 10-325 MG tablet Take 1 tablet by mouth every 4 (four) hours as needed for pain. 100 tablet 0  . repaglinide (PRANDIN) 1 MG tablet Take 1 tablet (1 mg total) by mouth 3 (three) times daily before meals. 90 tablet 11  . sitaGLIPtin (JANUVIA) 100 MG tablet Take 1 tablet (100 mg  total) by mouth daily. 90 tablet 3   Current Facility-Administered Medications on File Prior to Visit  Medication Dose Route Frequency Provider Last Rate Last Dose  . 0.9 %  sodium chloride infusion  500 mL Intravenous Continuous Nandigam, Venia Minks, MD        Allergies  Allergen Reactions  . Gabapentin Rash and Other (See Comments)    And diplopia (double vision)    Family History  Problem Relation Age of Onset  . Diabetes Maternal Grandmother   . Cancer Neg Hx   . Colon cancer Neg Hx   . Esophageal cancer Neg Hx   . Rectal cancer Neg Hx   . Stomach cancer Neg Hx     BP 122/78   Pulse 85   Wt 214 lb 6.4 oz (97.3 kg)   SpO2 95%   BMI 29.90 kg/m    Review of Systems He denies hypoglycemia    Objective:   Physical Exam VITAL SIGNS:  See vs page GENERAL: no  distress Pulses: foot pulses are intact bilaterally.   MSK: no deformity of the feet or ankles.  CV: no edema of the legs or ankles Skin:  no ulcer on the feet or ankles.  normal color and temp on the feet and ankles Neuro: sensation is intact to touch on the feet and ankles.     Lab Results  Component Value Date   HGBA1C 7.1 09/08/2017      Assessment & Plan:  Back pain, persistent Type 2 DM: well-controlled steroid injections: these are affecting a1c  Patient Instructions  I have sent a prescription to your pharmacy, to double the duloxetine.  Please continue the same medications for diabetes.  check your blood sugar once a day.  vary the time of day when you check, between before the 3 meals, and at bedtime.  also check if you have symptoms of your blood sugar being too high or too low.  please keep a record of the readings and bring it to your next appointment here (or you can bring the meter itself).  You can write it on any piece of paper.  please call us sooner if your blood sugar goes below 70, or if you have a lot of readings over 200.  Please come back for a follow-up appointment in 4 months.

## 2017-09-08 NOTE — Patient Instructions (Addendum)
I have sent a prescription to your pharmacy, to double the duloxetine.  Please continue the same medications for diabetes.  check your blood sugar once a day.  vary the time of day when you check, between before the 3 meals, and at bedtime.  also check if you have symptoms of your blood sugar being too high or too low.  please keep a record of the readings and bring it to your next appointment here (or you can bring the meter itself).  You can write it on any piece of paper.  please call us sooner if your blood sugar goes below 70, or if you have a lot of readings over 200.  Please come back for a follow-up appointment in 4 months.

## 2017-09-11 ENCOUNTER — Ambulatory Visit
Admission: RE | Admit: 2017-09-11 | Discharge: 2017-09-11 | Disposition: A | Discharge status: Home / Self Care | Payer: Medicare Other | Source: Ambulatory Visit | Attending: Nurse Practitioner | Admitting: Nurse Practitioner

## 2017-09-11 DIAGNOSIS — K7460 Unspecified cirrhosis of liver: Secondary | ICD-10-CM | POA: Diagnosis not present

## 2017-09-11 DIAGNOSIS — K7469 Other cirrhosis of liver: Secondary | ICD-10-CM

## 2017-09-11 DIAGNOSIS — H43812 Vitreous degeneration, left eye: Secondary | ICD-10-CM | POA: Diagnosis not present

## 2017-09-11 DIAGNOSIS — H25813 Combined forms of age-related cataract, bilateral: Secondary | ICD-10-CM | POA: Diagnosis not present

## 2017-09-11 DIAGNOSIS — E119 Type 2 diabetes mellitus without complications: Secondary | ICD-10-CM | POA: Diagnosis not present

## 2017-09-23 DIAGNOSIS — M545 Low back pain: Secondary | ICD-10-CM | POA: Diagnosis not present

## 2017-09-23 DIAGNOSIS — I10 Essential (primary) hypertension: Secondary | ICD-10-CM | POA: Diagnosis not present

## 2017-09-25 ENCOUNTER — Telehealth: Payer: Self-pay

## 2017-09-25 ENCOUNTER — Other Ambulatory Visit: Payer: Self-pay

## 2017-09-25 MED ORDER — OXYCODONE-ACETAMINOPHEN 10-325 MG PO TABS: 1.0000 | ORAL_TABLET | ORAL | 0 refills | Status: DC | PRN

## 2017-09-25 NOTE — Telephone Encounter (Signed)
Called and notified patient prescription was ready to pick up.

## 2017-09-25 NOTE — Telephone Encounter (Signed)
Patient called to request refill for his pain medication and would like a call when this is ready

## 2017-10-01 ENCOUNTER — Ambulatory Visit (INDEPENDENT_AMBULATORY_CARE_PROVIDER_SITE_OTHER): Payer: Medicare Other | Admitting: Internal Medicine

## 2017-10-01 ENCOUNTER — Encounter: Payer: Self-pay | Admitting: Internal Medicine

## 2017-10-01 VITALS — BP 142/78 | HR 75 | Ht 71.0 in | Wt 217.0 lb

## 2017-10-01 DIAGNOSIS — E785 Hyperlipidemia, unspecified: Secondary | ICD-10-CM | POA: Diagnosis not present

## 2017-10-01 DIAGNOSIS — I358 Other nonrheumatic aortic valve disorders: Secondary | ICD-10-CM

## 2017-10-01 DIAGNOSIS — E119 Type 2 diabetes mellitus without complications: Secondary | ICD-10-CM | POA: Diagnosis not present

## 2017-10-01 DIAGNOSIS — I10 Essential (primary) hypertension: Secondary | ICD-10-CM | POA: Diagnosis not present

## 2017-10-01 NOTE — Progress Notes (Signed)
OFFICE NOTE  Chief Complaint:  Murmur  Primary Care Physician: Renato Shin, MD  HPI:  Larry Robertson is a 76 y.o. male with a past medial history significant for type 2 diabetes, hypertension, dyslipidemia and diastolic dysfunction.  He had an echocardiogram in 2014, which showed an LVEF of 55-60% and grade 2 diastolic dysfunction.  No significant valvular disease was reported however he was noted to have mildly thickened and calcified aortic valve leaflets.  Recently he was thought to have a new murmur and referred for evaluation of this.  He denies any chest pain or worsening shortness of breath.  Blood pressures been reasonably well-controlled at home.  His EKG shows sinus rhythm.  Review of a lipid profile from July 2018 showed total cholesterol 176, triglycerides 1, HDL 61 and LDL 114.  This is obviously an erroneous measurement and I suspect his HDL and LDL are falsely calculated due to the low triglycerides.  Nonetheless he may not be close to goal with his cholesterol which should be an LDL less than 70 given his diabetes.  PMHx:  Past Medical History:  Diagnosis Date  . Arthritis   . Blood transfusion without reported diagnosis   . Colon polyps 05/19/2012  . 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 DIABETES MELLITUS, TYPE II 08/25/2007  . Diastolic dysfunction    on echo  . Diverticulosis   . ERECTILE DYSFUNCTION 08/25/2007  . 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 HEPATITIS C 08/25/2007  . HYPERTENSION 08/25/2007  . 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 HYPOGONADISM 09/27/2009    Past Surgical History:  Procedure Laterality Date  . ABDOMINAL HERNIA REPAIR  2000  . CHOLECYSTECTOMY  1999  . COLONOSCOPY    . LUMBAR FUSION  2005  . TONSILLECTOMY AND ADENOIDECTOMY    . UPPER GASTROINTESTINAL ENDOSCOPY      FAMHx:  Family History  Problem Relation Age of Onset  . Diabetes Maternal Grandmother   . Cancer Neg Hx   . Colon cancer Neg Hx   .  Esophageal cancer Neg Hx   . Rectal cancer Neg Hx   . Stomach cancer Neg Hx     SOCHx:   reports that he has never smoked. He has never used smokeless tobacco. He reports that he does not drink alcohol or use drugs.  ALLERGIES:  Allergies  Allergen Reactions  . Gabapentin Rash and Other (See Comments)    And diplopia (double vision)    ROS: Pertinent items noted in HPI and remainder of comprehensive ROS otherwise negative.  HOME MEDS: Current Outpatient Prescriptions on File Prior to Visit  Medication Sig Dispense Refill  . allopurinol (ZYLOPRIM) 300 MG tablet Take 300 mg by mouth daily.      Marland Kitchen amLODipine (NORVASC) 10 MG tablet TAKE 1 TABLET BY MOUTH EVERY DAY 90 tablet 1  . bromocriptine (PARLODEL) 2.5 MG tablet 1/4 tab daily 8 tablet 11  . colchicine 0.6 MG tablet 1 tablet every hour as needed for gout, not to exceed 6/day 6 tablet 4  . DULoxetine (CYMBALTA) 60 MG capsule Take 1 capsule (60 mg total) by mouth daily. 30 capsule 11  . fluconazole (DIFLUCAN) 100 MG tablet Take 1 tablet (100 mg total) by mouth daily. 14 tablet 0  . losartan (COZAAR) 100 MG tablet TAKE 1 TABLET (100 MG TOTAL) BY MOUTH DAILY. 90 tablet 3  . metFORMIN (GLUCOPHAGE) 1000 MG tablet TAKE 1 TABLET BY MOUTH 2 TIMES DAILY WITH A MEAL. (Patient taking  differently: TAKE 1 TABLET BY MOUTH 3 TIMES DAILY WITH A MEAL.) 180 tablet 1  . methocarbamol (ROBAXIN) 500 MG tablet Take 1 tablet (500 mg total) by mouth at bedtime and may repeat dose one time if needed. 10 tablet 0  . oxyCODONE-acetaminophen (PERCOCET) 10-325 MG tablet Take 1 tablet by mouth every 4 (four) hours as needed for pain. 100 tablet 0  . repaglinide (PRANDIN) 1 MG tablet Take 1 tablet (1 mg total) by mouth 3 (three) times daily before meals. 90 tablet 11  . sitaGLIPtin (JANUVIA) 100 MG tablet Take 1 tablet (100 mg total) by mouth daily. 90 tablet 3   Current Facility-Administered Medications on File Prior to Visit  Medication Dose Route Frequency  Provider Last Rate Last Dose  . 0.9 %  sodium chloride infusion  500 mL Intravenous Continuous Nandigam, Kavitha V, MD        LABS/IMAGING: No results found for this or any previous visit (from the past 48 hour(s)). No results found.  LIPID PANEL:    Component Value Date/Time   CHOL 176 06/08/2017 1019   TRIG 1.0 06/08/2017 1019   HDL 61.90 06/08/2017 1019   CHOLHDL 3 06/08/2017 1019   VLDL 0.2 06/08/2017 1019   LDLCALC 114 (H) 06/08/2017 1019   LDLDIRECT 45.0 04/14/2016 1037     WEIGHTS: Wt Readings from Last 3 Encounters:  10/01/17 217 lb (98.4 kg)  09/08/17 214 lb 6.4 oz (97.3 kg)  07/30/17 210 lb 12.8 oz (95.6 kg)    VITALS: BP (!) 142/78   Pulse 75   Ht 5\' 11"  (1.803 m)   Wt 217 lb (98.4 kg)   SpO2 93%   BMI 30.27 kg/m   EXAM: General appearance: alert and no distress Neck: no carotid bruit, no JVD and thyroid not enlarged, symmetric, no tenderness/mass/nodules Lungs: clear to auscultation bilaterally Heart: regular rate and rhythm, S1, S2 normal and systolic murmur: early systolic 2/6, crescendo at 2nd right intercostal space Abdomen: soft, non-tender; bowel sounds normal; no masses,  no organomegaly Extremities: extremities normal, atraumatic, no cyanosis or edema Pulses: 2+ and symmetric Skin: Skin color, texture, turgor normal. No rashes or lesions Neurologic: Grossly normal Psych: Pleasant  EKG: Normal sinus rhythm at 75- personally reviewed  ASSESSMENT: 1. 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 Type 2 diabetes 3. Dyslipidemia 4. Hypertension  PLAN: 1.   Mr. 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  Given his diabetes he has a lower target for LDL and he is likely not at goal.  His most recent lipid profile was erroneous indicating a triglyceride level of 1 which falsely will calculate his LDL.  I recommend a repeat lipid profile and would advocate for treatment if his LDL is greater than 70.  He can follow-up with his PCP regarding  this.  No further cardiac follow-up is necessary.  Thanks for the kind referral  Pixie Casino, MD, [place]  Attending Cardiologist  Direct Dial: [phone]  Fax: [phone]  Website:  [email or web address] 10/01/2017, 6:10 PM

## 2017-10-01 NOTE — Patient Instructions (Addendum)
Your physician recommends that you return for lab work FASTING to check cholesterol   Your physician wants you to follow-up with Dr. Debara Pickett as needed

## 2017-10-02 DIAGNOSIS — E785 Hyperlipidemia, unspecified: Secondary | ICD-10-CM | POA: Diagnosis not present

## 2017-10-02 LAB — LIPID PANEL
Chol/HDL Ratio: 4.3 ratio (ref 0.0–5.0)
Cholesterol, Total: 168 mg/dL (ref 100–199)
HDL: 39 mg/dL — ABNORMAL LOW (ref >39)
LDL Calculated: 86 mg/dL (ref 0–99)
Triglycerides: 217 mg/dL — ABNORMAL HIGH (ref 0–149)
VLDL Cholesterol Cal: 43 mg/dL — ABNORMAL HIGH (ref 5–40)

## 2017-10-04 ENCOUNTER — Other Ambulatory Visit: Payer: Self-pay | Admitting: Endocrinology

## 2017-10-04 MED ORDER — ROSUVASTATIN CALCIUM 5 MG PO TABS: 5.0000 mg | ORAL_TABLET | Freq: Every day | ORAL | 11 refills | Status: DC

## 2017-10-07 ENCOUNTER — Telehealth: Payer: Self-pay | Admitting: Internal Medicine

## 2017-10-07 ENCOUNTER — Encounter: Payer: Self-pay | Admitting: Internal Medicine

## 2017-10-07 DIAGNOSIS — E781 Pure hyperglyceridemia: Secondary | ICD-10-CM

## 2017-10-07 MED ORDER — ATORVASTATIN CALCIUM 20 MG PO TABS: 20.0000 mg | ORAL_TABLET | Freq: Every day | ORAL | 3 refills | Status: DC

## 2017-10-07 NOTE — Telephone Encounter (Signed)
-----   Message from Pixie Casino, MD sent at 10/03/2017  8:01 PM EDT ----- Lipid profile more accurate than the study 3 months ago - goal LDL <70 given diabetes. Would recommend starting atorvastatin 20 mg QHS - repeat lipid profile in 3 months.  Dr. Lemmie Evens

## 2017-10-07 NOTE — Telephone Encounter (Signed)
Patient aware of results. Agrees w/MD plan. Rx(s) sent to pharmacy electronically. Lab order and recent lab results mailed to patient.

## 2017-10-21 ENCOUNTER — Other Ambulatory Visit: Payer: Self-pay

## 2017-10-21 ENCOUNTER — Telehealth: Payer: Self-pay | Admitting: Endocrinology

## 2017-10-21 MED ORDER — OXYCODONE-ACETAMINOPHEN 10-325 MG PO TABS: 1.0000 | ORAL_TABLET | ORAL | 0 refills | Status: DC | PRN

## 2017-10-21 MED ORDER — LOSARTAN POTASSIUM 100 MG PO TABS: ORAL_TABLET | ORAL | 3 refills | Status: DC

## 2017-10-21 NOTE — Telephone Encounter (Signed)
Called patient & notified patient that prescription would in office ready to puck up.

## 2017-10-21 NOTE — Telephone Encounter (Signed)
See below . Thanks

## 2017-10-21 NOTE — Telephone Encounter (Signed)
Ok

## 2017-10-21 NOTE — Telephone Encounter (Signed)
Options: Another dr can refill in my absence. I can do on monday

## 2017-10-21 NOTE — Telephone Encounter (Signed)
Pt is in need of pain med refill

## 2017-11-02 ENCOUNTER — Other Ambulatory Visit: Payer: Self-pay

## 2017-11-02 MED ORDER — DULOXETINE HCL 60 MG PO CPEP: 60.0000 mg | ORAL_CAPSULE | Freq: Every day | ORAL | 2 refills | Status: DC

## 2017-11-09 ENCOUNTER — Other Ambulatory Visit: Payer: Self-pay | Admitting: Endocrinology

## 2017-11-19 DIAGNOSIS — M5416 Radiculopathy, lumbar region: Secondary | ICD-10-CM | POA: Diagnosis not present

## 2017-11-19 DIAGNOSIS — M4316 Spondylolisthesis, lumbar region: Secondary | ICD-10-CM | POA: Diagnosis not present

## 2017-11-20 ENCOUNTER — Telehealth: Payer: Self-pay | Admitting: Endocrinology

## 2017-11-20 NOTE — Telephone Encounter (Signed)
Pt needs refills on his pain meds. I let him know the MD was out

## 2017-11-20 NOTE — Telephone Encounter (Signed)
Please add on pt for 11:30 AM Monday, because there are new requirements for chronic opioid use.  This is a procedure our practice has implemented in order to comply with [facility] requirements:  1.  Sign a controlled substance contract once a year.  2.  Urine drug screen once a year. 3.  Appointment here every 3 months.  No other medical problems will be addressed at this visit.  At that visit, the [place] controlled substance reporting system is checked.

## 2017-11-20 NOTE — Telephone Encounter (Signed)
I called and patient was willing to come in. I have sent Colletta [place] a message for him to be added to Monday's schedule at 11:30.

## 2017-11-23 ENCOUNTER — Ambulatory Visit (INDEPENDENT_AMBULATORY_CARE_PROVIDER_SITE_OTHER): Payer: Medicare Other | Admitting: Endocrinology

## 2017-11-23 ENCOUNTER — Encounter: Payer: Self-pay | Admitting: Endocrinology

## 2017-11-23 DIAGNOSIS — F1190 Opioid use, unspecified, uncomplicated: Secondary | ICD-10-CM | POA: Insufficient documentation

## 2017-11-23 MED ORDER — OXYCODONE-ACETAMINOPHEN 10-325 MG PO TABS: 1.0000 | ORAL_TABLET | ORAL | 0 refills | Status: DC | PRN

## 2017-11-23 NOTE — Progress Notes (Signed)
Subjective:    Patient ID: Larry Robertson, male    DOB: 1941-02-13, 76 y.o.   MRN: [id number]  HPI Pt returns for chronic opioid rx:  Indication for chronic opioid: he has many years of severe pain at the lower back, and assoc pain at the left shoulder.   Medication: percocet 10/325 # pills per month: 100 Last annual UDS date: due now .Paincontract signed (once): can't be accessed today.  [place] narcotic database reviewed today.  He is found to have received narcotic rx in 2017 and mid-2018, from a Dr Barth Kirks, in [place], [place].  Pt says he has never seen or ever heard of this Dr.  Past Medical History:  Diagnosis Date  . Arthritis   . Blood transfusion without reported diagnosis   . Colon polyps 05/19/2012  . 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 DIABETES MELLITUS, TYPE II 08/25/2007  . Diastolic dysfunction    on echo  . Diverticulosis   . ERECTILE DYSFUNCTION 08/25/2007  . 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 HEPATITIS C 08/25/2007  . HYPERTENSION 08/25/2007  . 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 HYPOGONADISM 09/27/2009    Past Surgical History:  Procedure Laterality Date  . ABDOMINAL HERNIA REPAIR  2000  . CHOLECYSTECTOMY  1999  . COLONOSCOPY    . LUMBAR FUSION  2005  . TONSILLECTOMY AND ADENOIDECTOMY    . UPPER GASTROINTESTINAL ENDOSCOPY      Social History   Socioeconomic History  . Marital status: Single    Spouse name: Not on file  . Number of children: 7  . Years of education: Not on file  . Highest education level: Not on file  Social Needs  . Financial resource strain: Not on file  . Food insecurity - worry: Not on file  . Food insecurity - inability: Not on file  . Transportation needs - medical: Not on file  . Transportation needs - non-medical: Not on file  Occupational History  . 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 Smoking status: Never Smoker  . Smokeless tobacco: Never Used    Substance and Sexual Activity  . Alcohol use: No    Comment: 3-4 beers per week; no alcohol per pt as of 02-09-17 visit  . Drug use: No  . Sexual activity: Not on file  Other Topics Concern  . Not on file  Social History Narrative   Divorced    Current Outpatient Medications on File Prior to Visit  Medication Sig Dispense Refill  . allopurinol (ZYLOPRIM) 300 MG tablet Take 300 mg by mouth daily.      Marland Kitchen amLODipine (NORVASC) 10 MG tablet TAKE 1 TABLET BY MOUTH EVERY DAY 90 tablet 1  . atorvastatin (LIPITOR) 20 MG tablet Take 1 tablet (20 mg total) at bedtime by mouth. 90 tablet 3  . bromocriptine (PARLODEL) 2.5 MG tablet 1/4 tab daily 8 tablet 11  . colchicine 0.6 MG tablet 1 tablet every hour as needed for gout, not to exceed 6/day 6 tablet 4  . DULoxetine (CYMBALTA) 60 MG capsule Take 1 capsule (60 mg total) by mouth daily. 90 capsule 2  . fluconazole (DIFLUCAN) 100 MG tablet Take 1 tablet (100 mg total) by mouth daily. 14 tablet 0  . losartan (COZAAR) 100 MG tablet TAKE 1 TABLET (100 MG TOTAL) BY MOUTH DAILY. 90 tablet 3  . metFORMIN (GLUCOPHAGE) 1000 MG tablet TAKE 1 TABLET BY MOUTH 2 TIMES DAILY WITH  A MEAL. (Patient taking differently: TAKE 1 TABLET BY MOUTH 3 TIMES DAILY WITH A MEAL.) 180 tablet 1  . methocarbamol (ROBAXIN) 500 MG tablet Take 1 tablet (500 mg total) by mouth at bedtime and may repeat dose one time if needed. 10 tablet 0  . repaglinide (PRANDIN) 1 MG tablet Take 1 tablet (1 mg total) by mouth 3 (three) times daily before meals. 90 tablet 11  . rosuvastatin (CRESTOR) 5 MG tablet Take 1 tablet (5 mg total) daily by mouth. 30 tablet 11  . sitaGLIPtin (JANUVIA) 100 MG tablet Take 1 tablet (100 mg total) by mouth daily. 90 tablet 3   Current Facility-Administered Medications on File Prior to Visit  Medication Dose Route Frequency Provider Last Rate Last Dose  . 0.9 %  sodium chloride infusion  500 mL Intravenous Continuous Nandigam, Venia Minks, MD        Allergies   Allergen Reactions  . Gabapentin Rash and Other (See Comments)    And diplopia (double vision)    Family History  Problem Relation Age of Onset  . Diabetes Maternal Grandmother   . Cancer Neg Hx   . Colon cancer Neg Hx   . Esophageal cancer Neg Hx   . Rectal cancer Neg Hx   . Stomach cancer Neg Hx     BP (!) 160/84 (BP Location: Left Arm, Patient Position: Sitting, Cuff Size: Normal)   Pulse 92   Wt 217 lb 6.4 oz (98.6 kg)   SpO2 94%   BMI 30.32 kg/m    Review of Systems Denies constipation and vomiting.     Objective:   Physical Exam VITAL SIGNS:  See vs page.   GENERAL: no distress.  Spine: nontender.       Assessment & Plan:  Chronic pain syndrome, persistent.  I told patient that if Dr Barth Kirks comes up again on [place] datapbase, we'll have to D/C narcotics.  Also, we discussed the fact that he is considered to be at increased risk, so we'll need to reduce # of pills per month.     Patient Instructions  I have sent 3 prescriptions, each for a month.  For safety reasons, we need to reduce to 80 per month.   urine tests are requested for you today.  We'll let you know about the results.   Please come back for a follow-up appointment in 3 months.

## 2017-11-23 NOTE — Patient Instructions (Addendum)
I have sent 3 prescriptions, each for a month.  For safety reasons, we need to reduce to 80 per month.   urine tests are requested for you today.  We'll let you know about the results.   Please come back for a follow-up appointment in 3 months.

## 2017-11-28 LAB — PAIN MGMT, PROFILE 8 W/CONF, U
6 Acetylmorphine: NEGATIVE ng/mL (ref <10)
Alcohol Metabolites: NEGATIVE ng/mL (ref <500)
Amphetamines: NEGATIVE ng/mL (ref <500)
Benzodiazepines: NEGATIVE ng/mL (ref <100)
Buprenorphine, Urine: NEGATIVE ng/mL (ref <5)
Cocaine Metabolite: NEGATIVE ng/mL (ref <150)
Creatinine: 106 mg/dL
MDMA: NEGATIVE ng/mL (ref <500)
Marijuana Metabolite: NEGATIVE ng/mL (ref <20)
Opiates: NEGATIVE ng/mL (ref <100)
Oxidant: NEGATIVE ug/mL (ref <200)
Oxycodone: NEGATIVE ng/mL (ref <100)
pH: 7.88 (ref 4.5–9.0)

## 2017-12-22 DIAGNOSIS — M4316 Spondylolisthesis, lumbar region: Secondary | ICD-10-CM | POA: Diagnosis not present

## 2017-12-22 DIAGNOSIS — I10 Essential (primary) hypertension: Secondary | ICD-10-CM | POA: Diagnosis not present

## 2017-12-22 DIAGNOSIS — M5416 Radiculopathy, lumbar region: Secondary | ICD-10-CM | POA: Diagnosis not present

## 2017-12-24 ENCOUNTER — Telehealth: Payer: Self-pay | Admitting: Endocrinology

## 2017-12-24 NOTE — Telephone Encounter (Signed)
Patient need a refill of oxyCODONE-acetaminophen (PERCOCET) 10-325 MG tablet [[id number]]  Please let him know when it is ready

## 2017-12-24 NOTE — Telephone Encounter (Signed)
See message and please advise if ok to refill. Thanks!  

## 2017-12-24 NOTE — Telephone Encounter (Signed)
Refills are at your pharmacy

## 2017-12-24 NOTE — Telephone Encounter (Signed)
Patient needs script for Percocet asap. Please call patient when script is ready.

## 2017-12-25 ENCOUNTER — Other Ambulatory Visit: Payer: Self-pay

## 2017-12-25 NOTE — Telephone Encounter (Signed)
Calling is on the status of medication Percocet. Please advise  Call into his pharmacy  Send to  Pharmacy:  [id number] - [place], [place]. DEA #:  [id number]

## 2017-12-25 NOTE — Telephone Encounter (Signed)
please call patient's pharmacy: Last month, I sent 3 prescriptions, for 1 month each.  Can they please refill it?

## 2017-12-25 NOTE — Telephone Encounter (Signed)
I have called pharmacy & resolved.

## 2017-12-25 NOTE — Telephone Encounter (Signed)
[facility] called [facility] & they are filling patient's prescription.

## 2017-12-25 NOTE — Telephone Encounter (Signed)
I see the prescription was sent on 12/24 with 0 refills but nothing yesterday? Please advise?

## 2017-12-25 NOTE — Telephone Encounter (Signed)
All 3 (for 1 month each) were sent last month

## 2018-01-08 ENCOUNTER — Ambulatory Visit: Payer: Self-pay | Admitting: Endocrinology

## 2018-01-12 IMAGING — US US ABDOMEN COMPLETE W/ ELASTOGRAPHY
1 series · 13 of 22 positions shown · non-contrast
Comparison: CT on 09/20/2014 and ultrasound on 09/13/2013

CLINICAL DATA: Chronic hepatitis-C without hepatic coma . Prior
cholecystectomy.



[Series 1: us abdomen complete w/ elastography · 0.15mm/px · 13 of 22 slices shown]
[im 1/22]
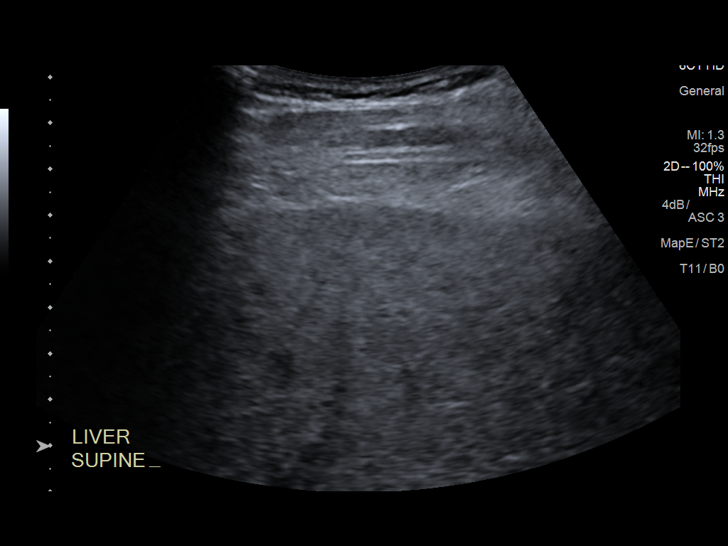
[im 3/22]
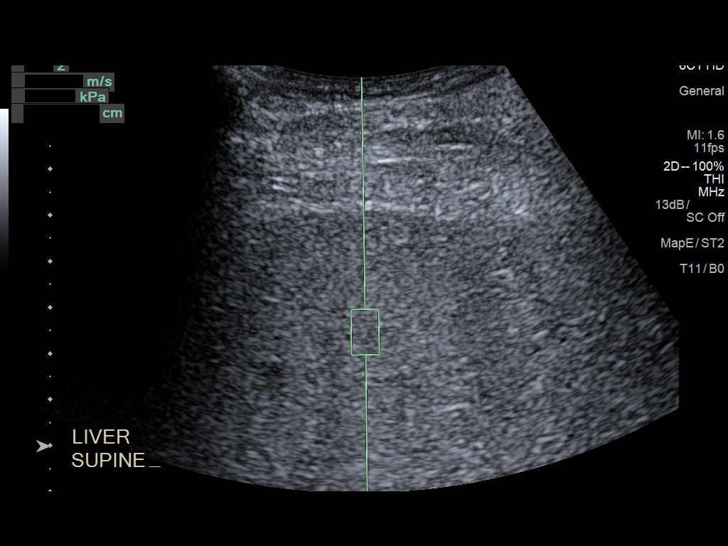
[im 5/22]
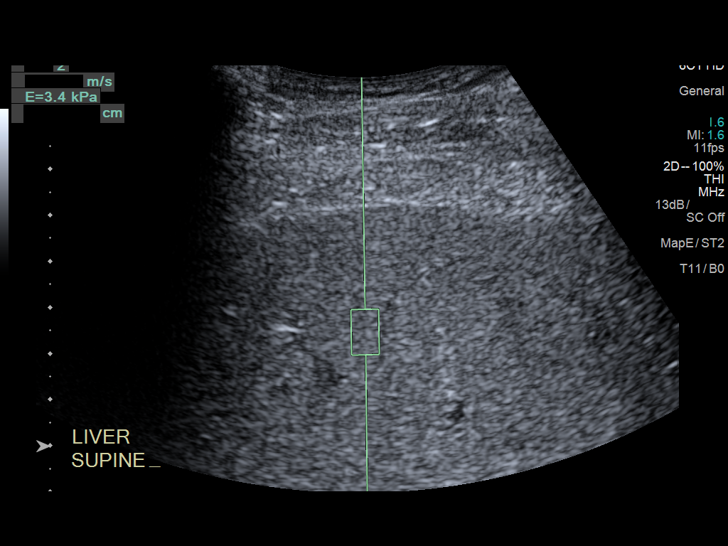
[im 6/22]
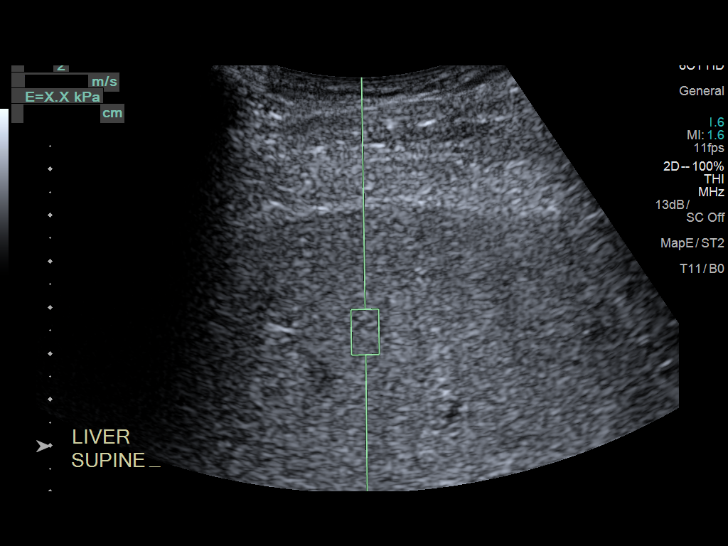
[im 8/22]
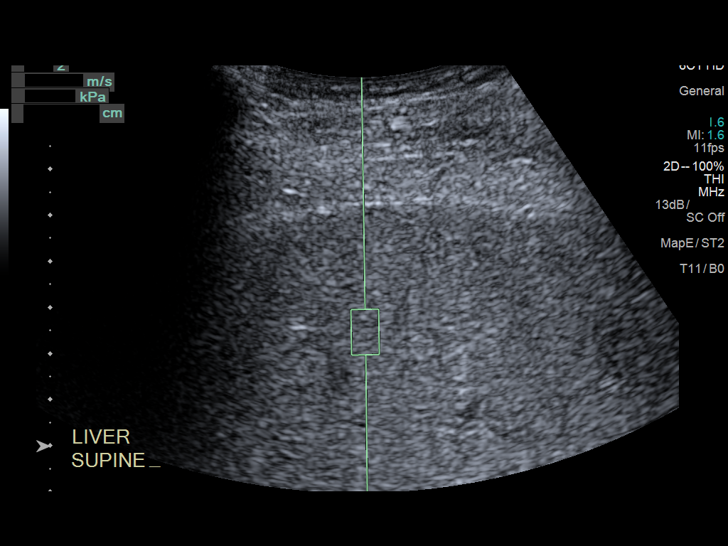
[im 10/22]
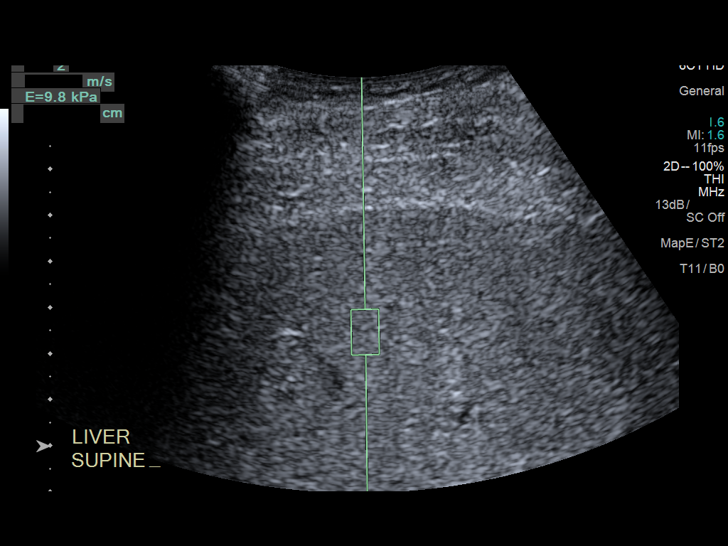
[im 12/22]
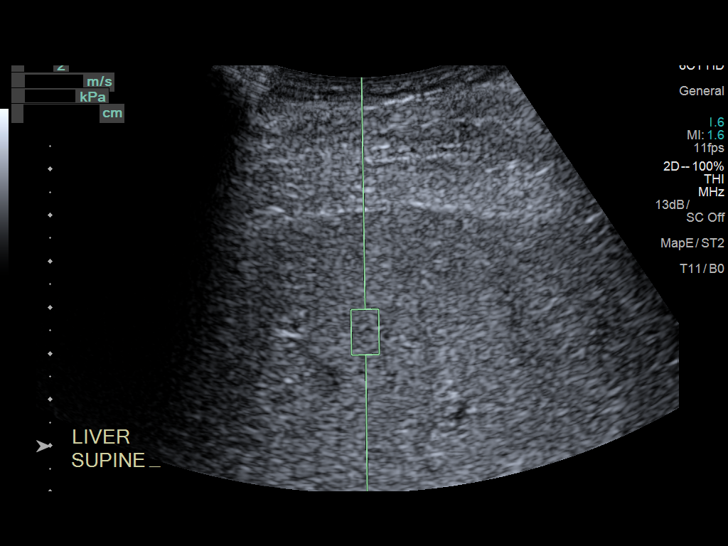
[im 13/22]
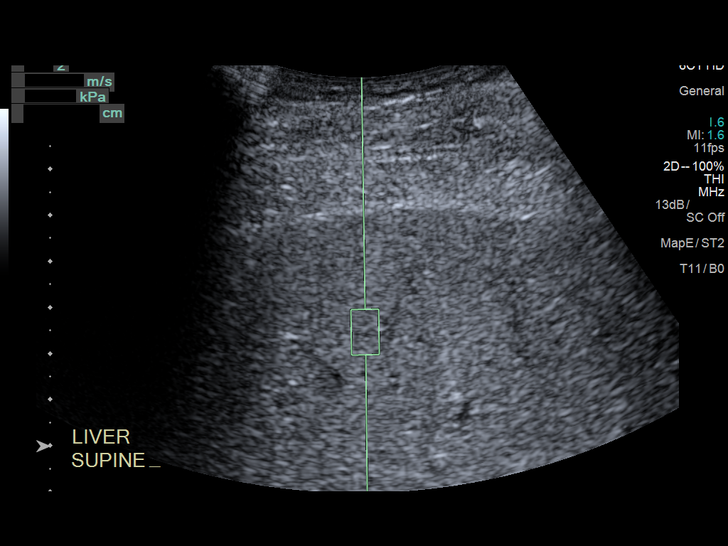
[im 15/22]
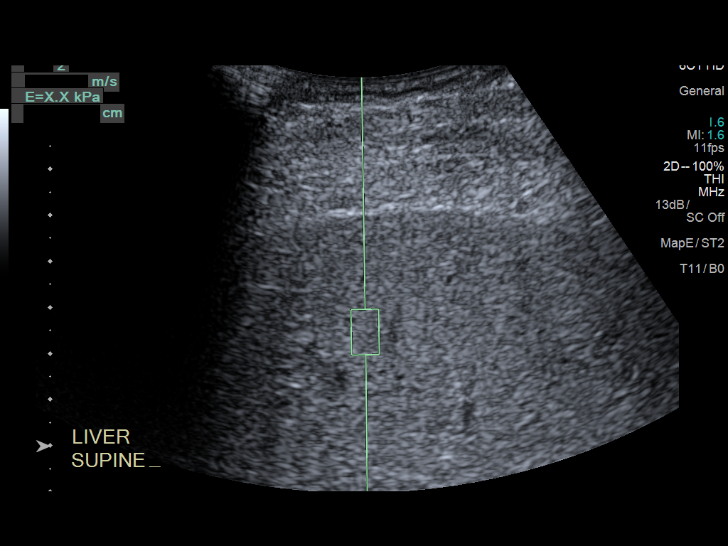
[im 17/22]
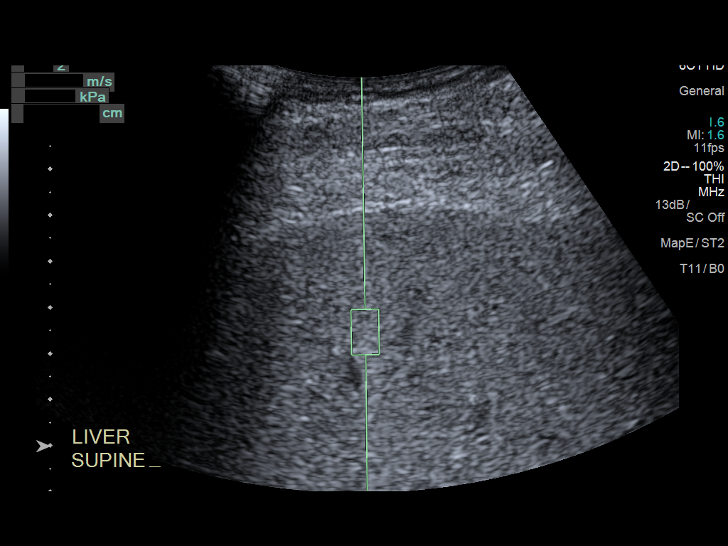
[im 18/22]
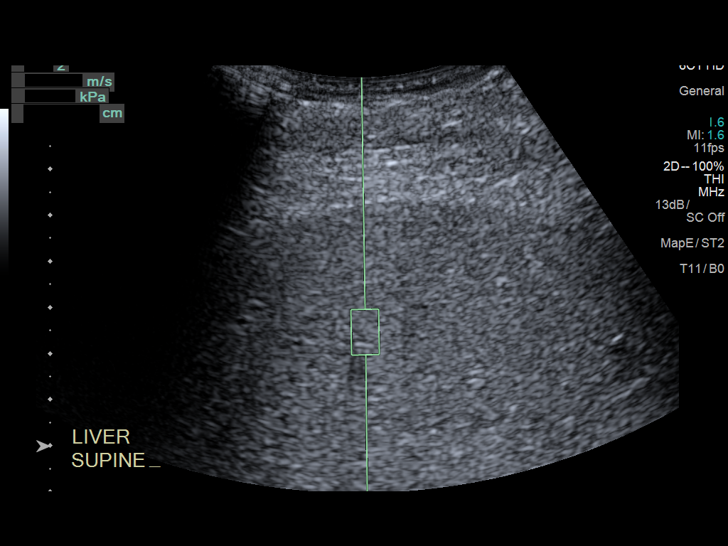
[im 20/22]
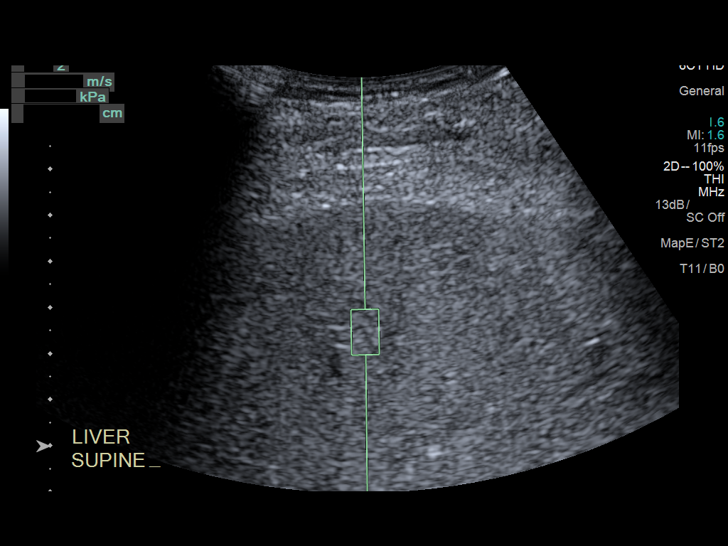
[im 22/22]
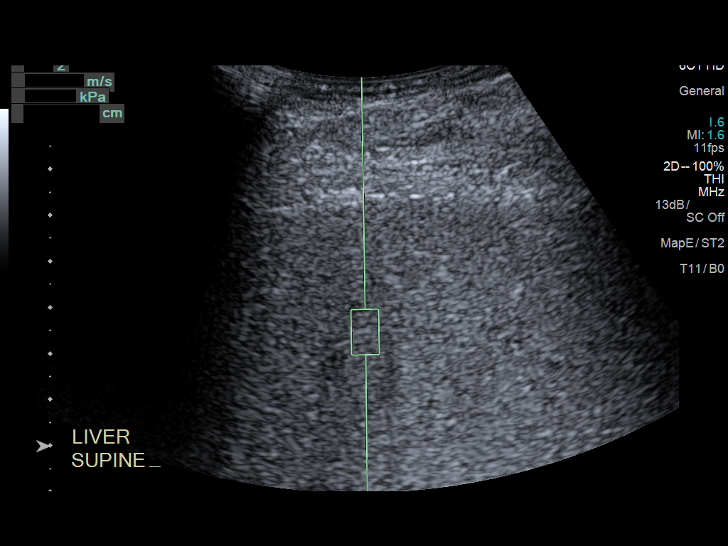

[13 of 22 positions shown; findings below may reference images not displayed]

FINDINGS: ULTRASOUND ABDOMEN

Gallbladder: Surgically absent

Common bile duct: Diameter: 3 mm, within normal limits.

Liver: Diffuse worsening of hepatic echotexture mild capsular
nodularity, suspicious for hepatic cirrhosis. No liver mass
identified. Portal vein remains patent.

IVC: No abnormality visualized.

Pancreas: Visualized portion unremarkable.

Spleen: Upper normal size measuring approximately 12 cm in length.
No splenic masses identified

Right Kidney: Length: 13.4 cm. Echogenicity within normal limits. No
mass or hydronephrosis visualized.

Left Kidney: Length: 13.4 cm. Echogenicity within normal limits. No
mass or hydronephrosis visualized. Subcapsular cyst again seen in
upper pole measuring 2.7 cm .

Abdominal aorta: No aneurysm visualized.

Other findings: None.

ULTRASOUND HEPATIC ELASTOGRAPHY

Device: Siemens Helix VTQ

Patient position: Left Lateral Decubitus

Transducer [id number]

Number of measurements: 10

Hepatic segment:  8

Median velocity:   2.88  m/sec

IQR:

IQR/Median velocity ratio:

Corresponding Metavir fibrosis score:  Some F3 + F4

Risk of fibrosis: High

Limitations of exam: Motion artifact

Pertinent findings noted on other imaging exams:  None

Please note that abnormal shear wave velocities may also be
identified in clinical settings other than with hepatic fibrosis,
such as: acute hepatitis, elevated right heart and central venous
pressures including use of beta blockers, Ramandeep disease
([place]), infiltrative processes such as
mastocytosis/amyloidosis/infiltrative tumor, extrahepatic
cholestasis, in the post-prandial state, and liver transplantation.
Correlation with patient history, laboratory data, and clinical
condition recommended.
IMPRESSION: ULTRASOUND ABDOMEN:
Prior cholecystectomy.  No evidence of biliary duct dilatation.

Diffuse coarsening of hepatic echotexture and mild capsular
nodularity, suspicious for hepatic cirrhosis. No liver mass
identified

ULTRASOUND HEPATIC ELASTOGRAPHY:

Median hepatic shear wave velocity is calculated at 2.88 m/sec.

Corresponding Metavir fibrosis score is  Some F3 + F4.

Risk of fibrosis is High.

Follow-up: Follow up advised

## 2018-01-13 DIAGNOSIS — M4316 Spondylolisthesis, lumbar region: Secondary | ICD-10-CM | POA: Diagnosis not present

## 2018-01-13 DIAGNOSIS — M5412 Radiculopathy, cervical region: Secondary | ICD-10-CM | POA: Diagnosis not present

## 2018-01-13 DIAGNOSIS — M4120 Other idiopathic scoliosis, site unspecified: Secondary | ICD-10-CM | POA: Diagnosis not present

## 2018-01-13 DIAGNOSIS — M542 Cervicalgia: Secondary | ICD-10-CM | POA: Diagnosis not present

## 2018-01-20 DIAGNOSIS — M5412 Radiculopathy, cervical region: Secondary | ICD-10-CM | POA: Diagnosis not present

## 2018-01-20 DIAGNOSIS — M542 Cervicalgia: Secondary | ICD-10-CM | POA: Diagnosis not present

## 2018-01-21 DIAGNOSIS — E781 Pure hyperglyceridemia: Secondary | ICD-10-CM | POA: Diagnosis not present

## 2018-01-22 LAB — LIPID PANEL
Chol/HDL Ratio: 3 ratio (ref 0.0–5.0)
Cholesterol, Total: 128 mg/dL (ref 100–199)
HDL: 42 mg/dL (ref >39)
LDL Calculated: 39 mg/dL (ref 0–99)
Triglycerides: 237 mg/dL — ABNORMAL HIGH (ref 0–149)
VLDL Cholesterol Cal: 47 mg/dL — ABNORMAL HIGH (ref 5–40)

## 2018-02-01 DIAGNOSIS — M5412 Radiculopathy, cervical region: Secondary | ICD-10-CM | POA: Diagnosis not present

## 2018-02-01 DIAGNOSIS — M5020 Other cervical disc displacement, unspecified cervical region: Secondary | ICD-10-CM | POA: Diagnosis not present

## 2018-02-01 DIAGNOSIS — I10 Essential (primary) hypertension: Secondary | ICD-10-CM | POA: Diagnosis not present

## 2018-02-01 DIAGNOSIS — M542 Cervicalgia: Secondary | ICD-10-CM | POA: Diagnosis not present

## 2018-02-02 ENCOUNTER — Telehealth: Payer: Self-pay | Admitting: *Deleted

## 2018-02-02 ENCOUNTER — Other Ambulatory Visit: Payer: Self-pay | Admitting: Neurosurgery

## 2018-02-02 NOTE — Telephone Encounter (Signed)
   [facility] Pre-operative Risk Assessment    Request for surgical clearance:  1. What type of surgery is being performed? C3-4, C6-7 right side approach, anterior cervical decompression/discectimy/fusion   2. When is this surgery scheduled? 03/04/18   3. What type of clearance is required (medical clearance vs. Pharmacy clearance to hold med vs. Both)? medical  4. Are there any medications that need to be held prior to surgery and how long? N/A   5. Practice name and name of physician performing surgery? [facility] Dr. Vertell Limber   6. What is your office phone and fax number? [phone]    7. Anesthesia type (None, local, MAC, general) ?    Latrina Guttman A Jasmen Emrich 02/02/2018, 4:10 PM  _________________________________________________________________   (provider comments below)  Request last OV note as well.

## 2018-02-04 NOTE — Telephone Encounter (Signed)
   Primary Cardiologist: Pixie Casino, MD  Chart reviewed as part of pre-operative protocol coverage. Patient was contacted 02/04/2018 in reference to pre-operative risk assessment for pending surgery as outlined below.  Larry Robertson was last seen on 10/01/2017 by Dr. Debara Pickett.  Since that day, Larry Robertson has done well and able to complete at least 4 METS of activity without any issue.   Therefore, based on ACC/AHA guidelines, the patient would be at acceptable risk for the planned procedure without further cardiovascular testing.   I will route this recommendation to the requesting party via Epic fax function and remove from pre-op pool.  Please call with questions.  Almyra Deforest, [place] 02/04/2018, 4:17 PM

## 2018-02-05 ENCOUNTER — Encounter: Payer: Self-pay | Admitting: Endocrinology

## 2018-02-05 ENCOUNTER — Ambulatory Visit (INDEPENDENT_AMBULATORY_CARE_PROVIDER_SITE_OTHER): Payer: Medicare Other | Admitting: Endocrinology

## 2018-02-05 VITALS — BP 162/92 | HR 94 | Wt 220.4 lb

## 2018-02-05 DIAGNOSIS — F1190 Opioid use, unspecified, uncomplicated: Secondary | ICD-10-CM

## 2018-02-05 MED ORDER — OXYCODONE-ACETAMINOPHEN 10-325 MG PO TABS: 1.0000 | ORAL_TABLET | ORAL | 0 refills | Status: DC | PRN

## 2018-02-05 MED ORDER — TRAZODONE HCL 100 MG PO TABS: 100.0000 mg | ORAL_TABLET | Freq: Every day | ORAL | 11 refills | Status: DC

## 2018-02-05 NOTE — Patient Instructions (Addendum)
You next pain med refill is due 02/18/18. I have sent a prescription to your pharmacy, to help you sleep.  Please come back for a follow-up appointment in 1 month, for diabetes (after you get out of the hospital).

## 2018-02-05 NOTE — Progress Notes (Signed)
Subjective:    Patient ID: Larry Robertson, male    DOB: 1941-05-10, 77 y.o.   MRN: [id number]  HPI Pt returns for chronic opioid rx:  Indication for chronic opioid: he has many years of severe pain at the lower back, and assoc pain at the left shoulder and both legs.   Medication: percocet 10/325 # pills per month: 80 Last annual UDS date: 11/23/17. Paincontract signed (once): done today.  [place] narcotic database reviewed today, and scanned into the EMR Non-opiod rx: lower back surg soon.  Robaxin did not help  Pain Inventory (1-10 worse): Average Pain: 7 Pain Right Now: 8  Pain is worse with: nothing Relief from Meds: good  In the last 24 hours, has pain interfered with the following (1-10 greatest interference) ? General activity: yes Relation with others: no Enjoyment of life  What TIME of day is your pain at its worst?  any time     Sleep (in general): poor  Mobility/Function: Assistance device: none How many minutes can you walk? 15 Ability to climb steps?  yes Do you drive? yes Disabled (date):  never Past Medical History:  Diagnosis Date  . Arthritis   . Blood transfusion without reported diagnosis   . Colon polyps 05/19/2012  . 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 DIABETES MELLITUS, TYPE II 08/25/2007  . Diastolic dysfunction    on echo  . Diverticulosis   . ERECTILE DYSFUNCTION 08/25/2007  . 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 HEPATITIS C 08/25/2007  . HYPERTENSION 08/25/2007  . 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 HYPOGONADISM 09/27/2009    Past Surgical History:  Procedure Laterality Date  . ABDOMINAL HERNIA REPAIR  2000  . CHOLECYSTECTOMY  1999  . COLONOSCOPY    . LUMBAR FUSION  2005  . TONSILLECTOMY AND ADENOIDECTOMY    . UPPER GASTROINTESTINAL ENDOSCOPY      Social History   Socioeconomic History  . Marital status: Single    Spouse name: Not on file  . Number of children: 7  . Years of education: Not on file  .  Highest education level: Not on file  Social Needs  . Financial resource strain: Not on file  . Food insecurity - worry: Not on file  . Food insecurity - inability: Not on file  . Transportation needs - medical: Not on file  . Transportation needs - non-medical: Not on file  Occupational History  . 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 Smoking status: Never Smoker  . Smokeless tobacco: Never Used  Substance and Sexual Activity  . Alcohol use: No    Comment: 3-4 beers per week; no alcohol per pt as of 02-09-17 visit  . Drug use: No  . Sexual activity: Not on file  Other Topics Concern  . Not on file  Social History Narrative   Divorced    Current Outpatient Medications on File Prior to Visit  Medication Sig Dispense Refill  . allopurinol (ZYLOPRIM) 300 MG tablet Take 300 mg by mouth daily.      Marland Kitchen amLODipine (NORVASC) 10 MG tablet TAKE 1 TABLET BY MOUTH EVERY DAY 90 tablet 1  . bromocriptine (PARLODEL) 2.5 MG tablet 1/4 tab daily 8 tablet 11  . colchicine 0.6 MG tablet 1 tablet every hour as needed for gout, not to exceed 6/day 6 tablet 4  . DULoxetine (CYMBALTA) 60 MG capsule Take 1 capsule (60 mg total) by mouth daily. [place]  capsule 2  . fluconazole (DIFLUCAN) 100 MG tablet Take 1 tablet (100 mg total) by mouth daily. 14 tablet 0  . losartan (COZAAR) 100 MG tablet TAKE 1 TABLET (100 MG TOTAL) BY MOUTH DAILY. 90 tablet 3  . metFORMIN (GLUCOPHAGE) 1000 MG tablet TAKE 1 TABLET BY MOUTH 2 TIMES DAILY WITH A MEAL. (Patient taking differently: TAKE 1 TABLET BY MOUTH 3 TIMES DAILY WITH A MEAL.) 180 tablet 1  . methocarbamol (ROBAXIN) 500 MG tablet Take 1 tablet (500 mg total) by mouth at bedtime and may repeat dose one time if needed. 10 tablet 0  . repaglinide (PRANDIN) 1 MG tablet Take 1 tablet (1 mg total) by mouth 3 (three) times daily before meals. 90 tablet 11  . rosuvastatin (CRESTOR) 5 MG tablet Take 1 tablet (5 mg total) daily by mouth. 30 tablet  11  . sitaGLIPtin (JANUVIA) 100 MG tablet Take 1 tablet (100 mg total) by mouth daily. 90 tablet 3  . atorvastatin (LIPITOR) 20 MG tablet Take 1 tablet (20 mg total) at bedtime by mouth. 90 tablet 3   Current Facility-Administered Medications on File Prior to Visit  Medication Dose Route Frequency Provider Last Rate Last Dose  . 0.9 %  sodium chloride infusion  500 mL Intravenous Continuous Nandigam, Venia Minks, MD        Allergies  Allergen Reactions  . Gabapentin Rash and Other (See Comments)    And diplopia (double vision)    Family History  Problem Relation Age of Onset  . Diabetes Maternal Grandmother   . Cancer Neg Hx   . Colon cancer Neg Hx   . Esophageal cancer Neg Hx   . Rectal cancer Neg Hx   . Stomach cancer Neg Hx     BP (!) 162/92 (BP Location: Left Arm, Patient Position: Sitting, Cuff Size: Normal)   Pulse 94   Wt 220 lb 6.4 oz (100 kg)   SpO2 (!) 80%   BMI 30.74 kg/m    Review of Systems Denies depression, but insomnia is worse.      Objective:   Physical Exam VITAL SIGNS:  See vs page GENERAL: no distress Spine: nontender.     Assessment & Plan:  Chronic pain syndrome: risk from pain meds is still high, so we'll continue to reduce meds.  Insomnia: worse  Patient Instructions  You next pain med refill is due 02/18/18. I have sent a prescription to your pharmacy, to help you sleep.  Please come back for a follow-up appointment in 1 month, for diabetes (after you get out of the hospital).

## 2018-02-12 ENCOUNTER — Other Ambulatory Visit: Payer: Self-pay

## 2018-02-12 MED ORDER — AMLODIPINE BESYLATE 10 MG PO TABS: 10.0000 mg | ORAL_TABLET | Freq: Every day | ORAL | 1 refills | Status: DC

## 2018-02-18 NOTE — H&P (Signed)
Patient ID:   [id number] Patient: Larry Robertson  Date of Birth: 09/29/1941 Visit Type: Office Visit   Date: 02/01/2018 12:30 PM Provider: Marchia Meiers. Vertell Limber MD   This 77 year old male presents for MRI review.   History of Present Illness: 1.  MRI review  Patient returns to review his MRI  Patient is very frustrated and says that he is having considerable pain.  He had a cervical MRI which shows significant left sided spondylosis and foraminal stenosis at both the C3-4 and C6-7 levels.  He says he is quite miserable with this.  The other levels look satisfactory.    He wishes to go ahead with surgery to get some relief for his pain.  He has left triceps weakness on confrontational examination.   He has a positive Spurling maneuver to the left.  He has a healed right-sided anterior cervical incision.         Medical/Surgical/Interim History Reviewed, no change.  Last detailed document date:03/23/2017.     PAST MEDICAL HISTORY, SURGICAL HISTORY, FAMILY HISTORY, SOCIAL HISTORY AND REVIEW OF SYSTEMS I have reviewed the patient's past medical, surgical, family and social history as well as the comprehensive review of systems as included on the [facility] history form dated 05/11/2017, which I have signed.  Family History: Reviewed, no changes.  Last detailed document date:03/23/2017.   Social History: Reviewed, no changes. Last detailed document date: 03/23/2017.    MEDICATIONS(added, continued or stopped this visit): Started Medication Directions Instruction Stopped   Cymbalta 60 mg capsule,delayed release take 1 capsule by oral route  every day     Januvia 100 mg tablet take 1 tablet by oral route  every day     metformin 1,000 mg tablet take 1 tablet by oral route 2 times every day with morning and evening meals     Norvasc 10 mg tablet take 1 tablet by oral route  every day     Percocet 10 mg-325 mg tablet take 1 tablet by oral route  every 6  hours as needed     Zyloprim 300 mg tablet take 1 tablet by oral route  every day       ALLERGIES: Ingredient Reaction Medication Name Comment  GABAPENTIN Rash     Reviewed, no changes.    Vitals Date Temp F BP Pulse Ht In Wt Lb BMI BSA Pain Score  02/01/2018  164/92 81 72 219.4 29.76  7/10      IMPRESSION The patient is very uncomfortable.  He wants to go to surgery.  This will consist of anterior cervical decompression and fusion C3-4 level and C6-7 level with possible use of Anchor C implant device.  This will be done from a right-sided approach because of his previous anterior cervical surgery from the right.   Completed Orders (this encounter) Order Details Reason Side Interpretation Result Initial Treatment Date Region  Hypertension education Patient to follow up with primary care provider.        Dietary management education, guidance, and counseling patient encouraged to eat a well balanced diet         Assessment/Plan # Detail Type Description   1. Assessment Cervicalgia (M54.2).       2. Assessment Radiculopathy, cervical region (M54.12).       3. 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   Plan Orders Soft Collar Regular [id number]).       4. Assessment Essential (primary) hypertension (I10).  5. Assessment Body mass index (BMI) 29.0-29.9, adult (V25.36).   Plan Orders Today's instructions / counseling include(s) Dietary management education, guidance, and counseling.           Pain Management Plan Pain Scale: 7/10. Method: Numeric Pain Intensity Scale. Location: back and left arm. Onset: 02/14/2017. Duration: varies. Quality: discomforting. Pain management follow-up plan of care: Patient is taking OTC pain relievers for relief..  Proceed with ACDF C3-4 and C6-7 levels via right-sided approach .  Risks and benefits were discussed in detail with patient and he wishes to proceed with surgery.  He was fitted for cervical collar.  His  questions were answered.  Surgeries been scheduled for March 04, 2018.    Orders: Diagnostic Procedures: Assessment Procedure  M54.12 Cervical Spine- Lateral  Instruction(s)/Education: Assessment Instruction  I10 Hypertension education  Z68.29 Dietary management education, guidance, and counseling  Miscellaneous: Assessment   M50.20 Soft Collar Regular ([id number])             Provider:  Vertell Limber MD, Marchia Meiers 02/05/2018 3:52 PM  Dictation edited by: Marchia Meiers. Vertell Limber    CC Providers: Renato Shin [facility] [place],  [place]  [id number]-   Diandra Cimini MD  [address] [place], [place] [address]              Electronically signed by Marchia Meiers. Vertell Limber MD on 02/05/2018 03:52 PM  Patient ID:   [id number] Patient: Larry Robertson  Date of Birth: June 25, 1941 Visit Type: Office Visit   Date: 03/23/2017 10:30 AM Provider: Marchia Meiers. Vertell Limber MD   This 77 year old male presents for back pain and Leg pain.  History of Present Illness: 1.  back pain  2.  Leg pain    Larry Robertson, 77 year old retired male returns for evaluation of low back and bilateral leg pain, L>R. (patient was last seen for ACDF in 2005).  Patient recalls no trauma, noting symptoms increasingx1 month.  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 He also has pain in his right shoulder. Pain in his low back goes below the knees but is mostly in the upper leg and buttocks. Patient is being treated for hepatitis C and taking Epclusa.        PAST MEDICAL/SURGICAL HISTORY   (Detailed)     Family History   (Detailed)   SOCIAL HISTORY  (Detailed) Preferred language is Unknown.   HOME ENVIRONMENT/SAFETY The patient has not fallen in the last year.        MEDICATIONS(added, continued or stopped this visit):   ALLERGIES:   REVIEW OF SYSTEMS System Neg/Pos Details  Constitutional Negative Chills, fatigue, fever, malaise, night sweats, weight gain and weight loss.  ENMT Negative Ear drainage, hearing loss, nasal drainage, otalgia, sinus pressure and sore throat.  Eyes Negative Eye discharge, eye pain and vision changes.  Respiratory Negative Chronic cough, cough, dyspnea, known TB exposure and wheezing.  Cardio Negative Chest pain, claudication, edema and irregular heartbeat/palpitations.  GI Negative Abdominal pain, blood in stool, change in stool pattern, constipation, decreased appetite, diarrhea, heartburn, nausea and vomiting.  GU Negative Dribbling, dysuria, erectile dysfunction, hematuria, polyuria, slow stream, urinary  frequency, urinary incontinence and urinary retention.  Endocrine Negative Cold intolerance, heat intolerance, polydipsia and polyphagia.  Neuro Negative Dizziness, extremity weakness, gait disturbance, headache, memory impairment, numbness in extremity, seizures and tremors.  Psych Negative Anxiety, depression and insomnia.  Integumentary Negative Brittle hair, brittle nails, change in shape/size of mole(s), hair loss, hirsutism, hives, pruritus, rash and skin lesion.  MS Positive Back pain.  Hema/Lymph Negative Easy bleeding, easy bruising and lymphadenopathy.  Allergic/Immuno Negative Contact allergy, environmental allergies, food allergies and seasonal allergies.  Reproductive Negative Penile discharge and sexual dysfunction.     Vitals Date Temp F BP Pulse Ht In Wt Lb BMI BSA Pain Score  03/23/2017  166/92 91 72 210.8 28.59  6/10     PHYSICAL EXAM    Motor Strength . Any abnormal findings will be noted below.   Right Left Hip  Flexor:  4+/5   Motor and other Tests    Right Left Spurlings  negative   Additional Findings:  negative spurling's on left, UE strength normal, 4+/5 left hip flexor, positive seated SLR on left, negative hoffman's sign, LE reflexes brisk, general cranial nerve exam normal.     IMPRESSION On confrontational testing, negative spurling's on left, UE strength normal, 4+/5 left hip flexor, positive seated SLR on left, negative hoffman's sign, LE reflexes brisk, general cranial nerve exam normal.   I believe the patient's shoulder discomfort is not related to neck.   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 Aortic calcification seen on X-ray. Need new lumbar MRI.  Scoliosis Xrays reveal: PT: 22     Mismatch: + 17     SVA: 73     Films show flat back      15 degree coronal curve  Completed Orders (this encounter) Order Details Reason Side Interpretation Result Initial Treatment Date Region  Scoliosis- AP/Lat      03/23/2017 All Levels to All Levels   Assessment/Plan # Detail Type Description   1. 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       2. Assessment Lumbar radiculopathy (M54.16).         Pain Assessment/Treatment Pain Scale: 6/10. Method: Numeric Pain Intensity Scale. Location: back and leg. Onset: 02/14/2017. Duration: varies. Quality: discomforting. Pain Assessment/Treatment follow-up plan of care: Patient taking medication as prescribed..  Fall Risk Plan The patient has not fallen in the last year.  Scheduled new lumbar MRI. Follow up after MRI.   Orders: Diagnostic Procedures: Assessment Procedure  M41.20 Scoliosis- AP/Lat             Provider:  Marchia Meiers. Vertell Limber MD  03/23/2017 02:06 PM Dictation edited by: Dionne Bucy    CC Providers: Erline Levine MD [address] [place], [place] [address]              Electronically signed by Marchia Meiers. Vertell Limber MD on  03/24/2017 05:16 PM

## 2018-02-23 NOTE — Pre-Procedure Instructions (Signed)
Larry Robertson  02/23/2018      [id number] - [place], [place] [address] Phone: [phone] Fax: [phone]    Your procedure is scheduled on Thurs., March 04, 2018  Report to [facility] Admitting   Call this number if you have problems the morning of surgery:  [phone]   Remember:  Do not eat food or drink liquids after midnight.  Take these medicines the morning of surgery with A SIP OF WATER: AmLODipine (NORVASC) and DULoxetine (CYMBALTA).  7 days before surgery (Mar. 28), stop taking all Aspirins, Vitamins, Fish oils, and Herbal medications. Also stop all NSAIDS i.e. Advil, Ibuprofen, Motrin, Aleve, Anaprox, Naproxen, BC and Goody Powders.  How to Manage Your Diabetes Before and After Surgery  Why is it important to control my blood sugar before and after surgery? . Improving blood sugar levels before and after surgery helps healing and can limit problems. . A way of improving blood sugar control is eating a healthy diet by: o  Eating less sugar and carbohydrates o  Increasing activity/exercise o  Talking with your doctor about reaching your blood sugar goals . High blood sugars (greater than 180 mg/dL) can raise your risk of infections and slow your recovery, so you will need to focus on controlling your diabetes during the weeks before surgery. . Make sure that the doctor who takes care of your diabetes knows about your planned surgery including the date and location.  How do I manage my blood sugar before surgery? . Check your blood sugar at least 4 times a day, starting 2 days before surgery, to make sure that the level is not too high or low. o Check your blood sugar the morning of your surgery when you wake up and every 2 hours until you get to the Short Stay unit. . If your blood sugar is less than 70 mg/dL, you will need to treat for low blood sugar: o Do not take insulin. o Treat a low  blood sugar (less than 70 mg/dL) with  cup of clear juice (cranberry or apple), 4 glucose tablets, OR glucose gel. Recheck blood sugar in 15 minutes after treatment (to make sure it is greater than 70 mg/dL). If your blood sugar is not greater than 70 mg/dL on recheck, call [phone] o  for further instructions. . Report your blood sugar to the short stay nurse when you get to Short Stay.  . If you are admitted to the hospital after surgery: o Your blood sugar will be checked by the staff and you will probably be given insulin after surgery (instead of oral diabetes medicines) to make sure you have good blood sugar levels. o The goal for blood sugar control after surgery is 80-180 mg/dL.  WHAT DO I DO ABOUT MY DIABETES MEDICATION?  Marland Kitchen Do not take MetFORMIN (GLUCOPHAGE), Repaglinide (PRANDINsitaGLIPtin), and (JANUVIA) the morning of surgery.  . If your CBG is greater than 220 mg/dL, call us at [phone]   Do not wear jewelry.  Do not wear lotions, powders, colognes, or deodorant.  Do not shave 48 hours prior to surgery.  Men may shave face and neck.  Do not bring valuables to the hospital.  [facility] is not responsible for any belongings or valuables.  Contacts, dentures or bridgework may not be worn into surgery.  Leave your suitcase in the car.  After surgery it may be brought to your room.  For  patients admitted to the hospital, discharge time will be determined by your treatment team.  Patients discharged the day of surgery will not be allowed to drive home.   Special instructions:   [facility]- Preparing For Surgery  Before surgery, you can play an important role. Because skin is not sterile, your skin needs to be as free of germs as possible. You can reduce the number of germs on your skin by washing with CHG (chlorahexidine gluconate) Soap before surgery.  CHG is an antiseptic cleaner which kills germs and bonds with the skin to continue killing germs even after  washing.  Please do not use if you have an allergy to CHG or antibacterial soaps. If your skin becomes reddened/irritated stop using the CHG.  Do not shave (including legs and underarms) for at least 48 hours prior to first CHG shower. It is OK to shave your face.  Please follow these instructions carefully.   1. Shower the NIGHT BEFORE SURGERY and the MORNING OF SURGERY with CHG.   2. If you chose to wash your hair, wash your hair first as usual with your normal shampoo.  3. After you shampoo, rinse your hair and body thoroughly to remove the shampoo.  4. Use CHG as you would any other liquid soap. You can apply CHG directly to the skin and wash gently with a scrungie or a clean washcloth.   5. Apply the CHG Soap to your body ONLY FROM THE NECK DOWN.  Do not use on open wounds or open sores. Avoid contact with your eyes, ears, mouth and genitals (private parts). Wash Face and genitals (private parts)  with your normal soap.  6. Wash thoroughly, paying special attention to the area where your surgery will be performed.  7. Thoroughly rinse your body with warm water from the neck down.  8. DO NOT shower/wash with your normal soap after using and rinsing off the CHG Soap.  9. Pat yourself dry with a CLEAN TOWEL.  10. Wear CLEAN PAJAMAS to bed the night before surgery, wear comfortable clothes the morning of surgery  11. Place CLEAN SHEETS on your bed the night of your first shower and DO NOT SLEEP WITH PETS.  Day of Surgery: Do not apply any deodorants/lotions. Please wear clean clothes to the hospital/surgery center.    Please read over the following fact sheets that you were given. Pain Booklet, Coughing and Deep Breathing, MRSA Information and Surgical Site Infection Prevention

## 2018-02-24 ENCOUNTER — Encounter (HOSPITAL_COMMUNITY): Payer: Self-pay

## 2018-02-24 ENCOUNTER — Encounter (HOSPITAL_COMMUNITY)
Admission: RE | Admit: 2018-02-24 | Discharge: 2018-02-24 | Disposition: A | Discharge status: Home / Self Care | Payer: Medicare Other | Source: Ambulatory Visit | Attending: Neurosurgery | Admitting: Neurosurgery

## 2018-02-24 DIAGNOSIS — E119 Type 2 diabetes mellitus without complications: Secondary | ICD-10-CM | POA: Diagnosis not present

## 2018-02-24 DIAGNOSIS — E785 Hyperlipidemia, unspecified: Secondary | ICD-10-CM | POA: Insufficient documentation

## 2018-02-24 DIAGNOSIS — Z01812 Encounter for preprocedural laboratory examination: Secondary | ICD-10-CM | POA: Diagnosis not present

## 2018-02-24 DIAGNOSIS — M5020 Other cervical disc displacement, unspecified cervical region: Secondary | ICD-10-CM | POA: Insufficient documentation

## 2018-02-24 DIAGNOSIS — B1920 Unspecified viral hepatitis C without hepatic coma: Secondary | ICD-10-CM | POA: Insufficient documentation

## 2018-02-24 DIAGNOSIS — I10 Essential (primary) hypertension: Secondary | ICD-10-CM | POA: Insufficient documentation

## 2018-02-24 HISTORY — DX: Cardiac murmur, unspecified: R01.1

## 2018-02-24 HISTORY — DX: Inflammatory liver disease, unspecified: K75.9

## 2018-02-24 LAB — HEMOGLOBIN A1C
Hgb A1c MFr Bld: 6.1 % — ABNORMAL HIGH (ref 4.8–5.6)
Mean Plasma Glucose: 128.37 mg/dL

## 2018-02-24 LAB — TYPE AND SCREEN
ABO/RH(D): A NEG
Antibody Screen: NEGATIVE

## 2018-02-24 LAB — COMPREHENSIVE METABOLIC PANEL
ALT: 29 U/L (ref 17–63)
AST: 25 U/L (ref 15–41)
Albumin: 4.3 g/dL (ref 3.5–5.0)
Alkaline Phosphatase: 51 U/L (ref 38–126)
Anion gap: 14 (ref 5–15)
BUN: 8 mg/dL (ref 6–20)
CO2: 25 mmol/L (ref 22–32)
Calcium: 9.9 mg/dL (ref 8.9–10.3)
Chloride: 99 mmol/L — ABNORMAL LOW (ref 101–111)
Creatinine, Ser: 1.06 mg/dL (ref 0.61–1.24)
GFR calc Af Amer: >60 mL/min (ref >60)
GFR calc non Af Amer: >60 mL/min (ref >60)
Glucose, Bld: 301 mg/dL — ABNORMAL HIGH (ref 65–99)
Potassium: 3.2 mmol/L — ABNORMAL LOW (ref 3.5–5.1)
Sodium: 138 mmol/L (ref 135–145)
Total Bilirubin: 1.1 mg/dL (ref 0.3–1.2)
Total Protein: 7.2 g/dL (ref 6.5–8.1)

## 2018-02-24 LAB — CBC
HCT: 42 % (ref 39.0–52.0)
Hemoglobin: 14.3 g/dL (ref 13.0–17.0)
MCH: 30.9 pg (ref 26.0–34.0)
MCHC: 34 g/dL (ref 30.0–36.0)
MCV: 90.7 fL (ref 78.0–100.0)
Platelets: 183 10*3/uL (ref 150–400)
RBC: 4.63 MIL/uL (ref 4.22–5.81)
RDW: 13.3 % (ref 11.5–15.5)
WBC: 10.4 10*3/uL (ref 4.0–10.5)

## 2018-02-24 LAB — GLUCOSE, CAPILLARY: Glucose-Capillary: 271 mg/dL — ABNORMAL HIGH (ref 65–99)

## 2018-02-24 LAB — SURGICAL PCR SCREEN
MRSA, PCR: NEGATIVE
Staphylococcus aureus: NEGATIVE

## 2018-02-24 LAB — ABO/RH: ABO/RH(D): A NEG

## 2018-02-25 NOTE — Progress Notes (Signed)
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 Pt cleared for surgery by Larry Deforest, PA on 02/04/18  PMH includes:  HTN, DM, hyperlipidemia, hepatitis C. Never smoker. BMI 29  Medications include: amlodipine, lipitor, losartan, metformin, prandin, rosuvastatin, sitaglipitin  BP (!) 163/82   Pulse 85   Temp (!) 36.3 C   Resp 20   Ht 6\' 1"  (1.854 m)   Wt 218 lb 1.6 oz (98.9 kg)   SpO2 95%   BMI 28.77 kg/m   Preoperative labs reviewed.   - HbA1c 6.1, glucose 301  EKG 10/01/17: NSR  Echo 05/05/13:  - Left ventricle: The cavity size was normal. Wall thickness was increased in a pattern of mild LVH. Systolic function was normal. The estimated ejection fraction was in the range of 55% to 60%. Features are consistent with a pseudonormal left ventricular filling pattern, with concomitant abnormal relaxation and increased filling pressure (grade 2 diastolic dysfunction).  If glucose acceptable day of surgery, I anticipate pt can proceed with surgery as scheduled.   Larry Cass, FNP-BC [facility] Short Stay Surgical [facility]/Anesthesiology Phone: [phone] 02/25/2018 4:19 PM

## 2018-03-04 ENCOUNTER — Ambulatory Visit (HOSPITAL_COMMUNITY): Payer: Medicare Other | Admitting: Emergency Medicine

## 2018-03-04 ENCOUNTER — Other Ambulatory Visit: Payer: Self-pay

## 2018-03-04 ENCOUNTER — Ambulatory Visit (HOSPITAL_COMMUNITY): Payer: Medicare Other

## 2018-03-04 ENCOUNTER — Inpatient Hospital Stay (HOSPITAL_COMMUNITY)
Admission: AD | Admit: 2018-03-04 | Discharge: 2018-03-05 | DRG: 473 | Disposition: A | Discharge status: Home / Self Care | Payer: Medicare Other | Source: Ambulatory Visit | Attending: Neurosurgery | Admitting: Neurosurgery

## 2018-03-04 ENCOUNTER — Encounter (HOSPITAL_COMMUNITY): Payer: Self-pay | Admitting: *Deleted

## 2018-03-04 ENCOUNTER — Ambulatory Visit (HOSPITAL_COMMUNITY): Payer: Medicare Other | Admitting: Certified Registered"

## 2018-03-04 ENCOUNTER — Encounter (HOSPITAL_COMMUNITY)
Admission: AD | Disposition: A | Discharge status: Home / Self Care | Payer: Self-pay | Source: Ambulatory Visit | Attending: Neurosurgery

## 2018-03-04 DIAGNOSIS — M1990 Unspecified osteoarthritis, unspecified site: Secondary | ICD-10-CM | POA: Diagnosis present

## 2018-03-04 DIAGNOSIS — M5020 Other cervical disc displacement, unspecified cervical region: Secondary | ICD-10-CM | POA: Diagnosis present

## 2018-03-04 DIAGNOSIS — I700 Atherosclerosis of aorta: Secondary | ICD-10-CM | POA: Diagnosis not present

## 2018-03-04 DIAGNOSIS — M5011 Cervical disc disorder with radiculopathy,  high cervical region: Secondary | ICD-10-CM | POA: Diagnosis not present

## 2018-03-04 DIAGNOSIS — M4322 Fusion of spine, cervical region: Secondary | ICD-10-CM | POA: Diagnosis not present

## 2018-03-04 DIAGNOSIS — K7460 Unspecified cirrhosis of liver: Secondary | ICD-10-CM

## 2018-03-04 DIAGNOSIS — Z981 Arthrodesis status: Secondary | ICD-10-CM

## 2018-03-04 DIAGNOSIS — M4120 Other idiopathic scoliosis, site unspecified: Secondary | ICD-10-CM | POA: Diagnosis present

## 2018-03-04 DIAGNOSIS — G709 Myoneural disorder, unspecified: Secondary | ICD-10-CM | POA: Diagnosis not present

## 2018-03-04 DIAGNOSIS — R32 Unspecified urinary incontinence: Secondary | ICD-10-CM | POA: Diagnosis present

## 2018-03-04 DIAGNOSIS — Z8619 Personal history of other infectious and parasitic diseases: Secondary | ICD-10-CM

## 2018-03-04 DIAGNOSIS — Z419 Encounter for procedure for purposes other than remedying health state, unspecified: Secondary | ICD-10-CM

## 2018-03-04 DIAGNOSIS — M4802 Spinal stenosis, cervical region: Secondary | ICD-10-CM | POA: Diagnosis not present

## 2018-03-04 DIAGNOSIS — M5412 Radiculopathy, cervical region: Secondary | ICD-10-CM | POA: Diagnosis not present

## 2018-03-04 DIAGNOSIS — Z888 Allergy status to other drugs, medicaments and biological substances status: Secondary | ICD-10-CM | POA: Diagnosis not present

## 2018-03-04 DIAGNOSIS — M479 Spondylosis, unspecified: Secondary | ICD-10-CM | POA: Diagnosis not present

## 2018-03-04 DIAGNOSIS — E119 Type 2 diabetes mellitus without complications: Secondary | ICD-10-CM | POA: Diagnosis not present

## 2018-03-04 DIAGNOSIS — Z201 Contact with and (suspected) exposure to tuberculosis: Secondary | ICD-10-CM | POA: Diagnosis present

## 2018-03-04 DIAGNOSIS — Z86010 Personal history of colonic polyps: Secondary | ICD-10-CM

## 2018-03-04 DIAGNOSIS — Z7984 Long term (current) use of oral hypoglycemic drugs: Secondary | ICD-10-CM

## 2018-03-04 DIAGNOSIS — I10 Essential (primary) hypertension: Secondary | ICD-10-CM | POA: Diagnosis present

## 2018-03-04 DIAGNOSIS — Z79899 Other long term (current) drug therapy: Secondary | ICD-10-CM | POA: Diagnosis not present

## 2018-03-04 HISTORY — PX: ANTERIOR CERVICAL DECOMP/DISCECTOMY FUSION: SHX1161

## 2018-03-04 LAB — GLUCOSE, CAPILLARY
Glucose-Capillary: 177 mg/dL — ABNORMAL HIGH (ref 65–99)
Glucose-Capillary: 219 mg/dL — ABNORMAL HIGH (ref 65–99)
Glucose-Capillary: 318 mg/dL — ABNORMAL HIGH (ref 65–99)

## 2018-03-04 SURGERY — ANTERIOR CERVICAL DECOMPRESSION/DISCECTOMY FUSION 2 LEVELS
Anesthesia: General | Site: Spine Cervical | Laterality: Right

## 2018-03-04 MED ORDER — ALLOPURINOL 300 MG PO TABS: 300.0000 mg | ORAL_TABLET | Freq: Every day | ORAL | Status: DC | PRN

## 2018-03-04 MED ORDER — CHLORHEXIDINE GLUCONATE CLOTH 2 % EX PADS: 6.0000 | MEDICATED_PAD | Freq: Once | CUTANEOUS | Status: DC

## 2018-03-04 MED ORDER — DEXAMETHASONE SODIUM PHOSPHATE 10 MG/ML IJ SOLN
INTRAMUSCULAR | Status: DC | PRN
  Administered 2018-03-04: 10 mg via INTRAVENOUS

## 2018-03-04 MED ORDER — REPAGLINIDE 0.5 MG PO TABS
1.0000 mg | ORAL_TABLET | Freq: Three times a day (TID) | ORAL | Status: DC
  Filled 2018-03-04: qty 2
  Filled 2018-03-04 (×2): qty 1

## 2018-03-04 MED ORDER — SODIUM CHLORIDE 0.9 % IV SOLN: 250.0000 mL | INTRAVENOUS | Status: DC

## 2018-03-04 MED ORDER — 0.9 % SODIUM CHLORIDE (POUR BTL) OPTIME
TOPICAL | Status: DC | PRN
  Administered 2018-03-04: 1000 mL

## 2018-03-04 MED ORDER — LIDOCAINE HCL (CARDIAC) 20 MG/ML IV SOLN
INTRAVENOUS | Status: DC | PRN
  Administered 2018-03-04: 50 mg via INTRATRACHEAL

## 2018-03-04 MED ORDER — METFORMIN HCL 500 MG PO TABS
1000.0000 mg | ORAL_TABLET | Freq: Every day | ORAL | Status: DC
  Administered 2018-03-05: 1000 mg via ORAL
  Filled 2018-03-04: qty 2

## 2018-03-04 MED ORDER — ONDANSETRON HCL 4 MG PO TABS: 4.0000 mg | ORAL_TABLET | Freq: Four times a day (QID) | ORAL | Status: DC | PRN

## 2018-03-04 MED ORDER — ZOLPIDEM TARTRATE 5 MG PO TABS: 5.0000 mg | ORAL_TABLET | Freq: Every evening | ORAL | Status: DC | PRN

## 2018-03-04 MED ORDER — SODIUM CHLORIDE 0.9 % IV SOLN: 500.0000 mL | INTRAVENOUS | Status: DC

## 2018-03-04 MED ORDER — CEFAZOLIN SODIUM-DEXTROSE 2-4 GM/100ML-% IV SOLN
2.0000 g | Freq: Three times a day (TID) | INTRAVENOUS | Status: AC
  Administered 2018-03-04 – 2018-03-05 (×2): 2 g via INTRAVENOUS
  Filled 2018-03-04 (×2): qty 100

## 2018-03-04 MED ORDER — KCL IN DEXTROSE-NACL 20-5-0.45 MEQ/L-%-% IV SOLN
INTRAVENOUS | Status: DC
  Administered 2018-03-04: 19:00:00 via INTRAVENOUS
  Filled 2018-03-04: qty 1000

## 2018-03-04 MED ORDER — FENTANYL CITRATE (PF) 250 MCG/5ML IJ SOLN
INTRAMUSCULAR | Status: DC | PRN
  Administered 2018-03-04 (×2): 50 ug via INTRAVENOUS
  Administered 2018-03-04: 100 ug via INTRAVENOUS
  Administered 2018-03-04: 50 ug via INTRAVENOUS

## 2018-03-04 MED ORDER — ACETAMINOPHEN 325 MG PO TABS: 650.0000 mg | ORAL_TABLET | ORAL | Status: DC | PRN

## 2018-03-04 MED ORDER — PHENYLEPHRINE 40 MCG/ML (10ML) SYRINGE FOR IV PUSH (FOR BLOOD PRESSURE SUPPORT)
PREFILLED_SYRINGE | INTRAVENOUS | Status: AC
  Filled 2018-03-04: qty 10

## 2018-03-04 MED ORDER — LIDOCAINE-EPINEPHRINE 1 %-1:100000 IJ SOLN
INTRAMUSCULAR | Status: AC
  Filled 2018-03-04: qty 1

## 2018-03-04 MED ORDER — METHOCARBAMOL 500 MG PO TABS
500.0000 mg | ORAL_TABLET | Freq: Four times a day (QID) | ORAL | Status: DC | PRN
  Administered 2018-03-04 – 2018-03-05 (×2): 500 mg via ORAL
  Filled 2018-03-04 (×2): qty 1

## 2018-03-04 MED ORDER — THROMBIN 5000 UNITS EX SOLR
CUTANEOUS | Status: AC
  Filled 2018-03-04: qty 5000

## 2018-03-04 MED ORDER — HYDROCODONE-ACETAMINOPHEN 5-325 MG PO TABS
2.0000 | ORAL_TABLET | ORAL | Status: DC | PRN
  Administered 2018-03-04 – 2018-03-05 (×2): 2 via ORAL
  Filled 2018-03-04 (×2): qty 2

## 2018-03-04 MED ORDER — DULOXETINE HCL 60 MG PO CPEP
60.0000 mg | ORAL_CAPSULE | Freq: Every day | ORAL | Status: DC
  Administered 2018-03-05: 60 mg via ORAL
  Filled 2018-03-04: qty 1

## 2018-03-04 MED ORDER — CEFAZOLIN SODIUM-DEXTROSE 2-4 GM/100ML-% IV SOLN
INTRAVENOUS | Status: AC
  Filled 2018-03-04: qty 100

## 2018-03-04 MED ORDER — SUGAMMADEX SODIUM 200 MG/2ML IV SOLN
INTRAVENOUS | Status: DC | PRN
  Administered 2018-03-04: 200 mg via INTRAVENOUS

## 2018-03-04 MED ORDER — OXYCODONE-ACETAMINOPHEN 5-325 MG PO TABS: 1.0000 | ORAL_TABLET | ORAL | Status: DC | PRN

## 2018-03-04 MED ORDER — INSULIN ASPART 100 UNIT/ML ~~LOC~~ SOLN
4.0000 [IU] | Freq: Three times a day (TID) | SUBCUTANEOUS | Status: DC
  Administered 2018-03-05: 4 [IU] via SUBCUTANEOUS

## 2018-03-04 MED ORDER — MEPERIDINE HCL 50 MG/ML IJ SOLN: 6.2500 mg | INTRAMUSCULAR | Status: DC | PRN

## 2018-03-04 MED ORDER — LOSARTAN POTASSIUM 50 MG PO TABS
100.0000 mg | ORAL_TABLET | Freq: Every day | ORAL | Status: DC
  Administered 2018-03-05: 100 mg via ORAL
  Filled 2018-03-04: qty 2

## 2018-03-04 MED ORDER — SUGAMMADEX SODIUM 200 MG/2ML IV SOLN
INTRAVENOUS | Status: AC
  Filled 2018-03-04: qty 2

## 2018-03-04 MED ORDER — OXYCODONE HCL 5 MG PO TABS
5.0000 mg | ORAL_TABLET | ORAL | Status: DC | PRN
  Administered 2018-03-04: 5 mg via ORAL
  Filled 2018-03-04: qty 1

## 2018-03-04 MED ORDER — METHOCARBAMOL 1000 MG/10ML IJ SOLN: 500.0000 mg | Freq: Four times a day (QID) | INTRAVENOUS | Status: DC | PRN

## 2018-03-04 MED ORDER — SODIUM CHLORIDE 0.9% FLUSH
3.0000 mL | Freq: Two times a day (BID) | INTRAVENOUS | Status: DC
  Administered 2018-03-04 – 2018-03-05 (×2): 3 mL via INTRAVENOUS

## 2018-03-04 MED ORDER — LACTATED RINGERS IV SOLN: INTRAVENOUS | Status: DC

## 2018-03-04 MED ORDER — BUPIVACAINE HCL (PF) 0.5 % IJ SOLN
INTRAMUSCULAR | Status: DC | PRN
  Administered 2018-03-04: 4 mL

## 2018-03-04 MED ORDER — LIDOCAINE HCL (CARDIAC) 20 MG/ML IV SOLN
INTRAVENOUS | Status: AC
  Filled 2018-03-04: qty 5

## 2018-03-04 MED ORDER — PHENOL 1.4 % MT LIQD: 1.0000 | OROMUCOSAL | Status: DC | PRN

## 2018-03-04 MED ORDER — ROCURONIUM BROMIDE 100 MG/10ML IV SOLN
INTRAVENOUS | Status: DC | PRN
  Administered 2018-03-04: 50 mg via INTRAVENOUS

## 2018-03-04 MED ORDER — PHENYLEPHRINE HCL 10 MG/ML IJ SOLN
INTRAVENOUS | Status: DC | PRN
  Administered 2018-03-04: 25 ug/min via INTRAVENOUS

## 2018-03-04 MED ORDER — ROCURONIUM BROMIDE 10 MG/ML (PF) SYRINGE
PREFILLED_SYRINGE | INTRAVENOUS | Status: AC
  Filled 2018-03-04: qty 5

## 2018-03-04 MED ORDER — ALBUTEROL SULFATE HFA 108 (90 BASE) MCG/ACT IN AERS
INHALATION_SPRAY | RESPIRATORY_TRACT | Status: DC | PRN
  Administered 2018-03-04 (×6): 2 via RESPIRATORY_TRACT

## 2018-03-04 MED ORDER — COLCHICINE 0.6 MG PO TABS: 0.6000 mg | ORAL_TABLET | ORAL | Status: DC | PRN

## 2018-03-04 MED ORDER — PROPOFOL 10 MG/ML IV BOLUS
INTRAVENOUS | Status: AC
  Filled 2018-03-04: qty 20

## 2018-03-04 MED ORDER — PROMETHAZINE HCL 25 MG/ML IJ SOLN: 6.2500 mg | INTRAMUSCULAR | Status: DC | PRN

## 2018-03-04 MED ORDER — FLEET ENEMA 7-19 GM/118ML RE ENEM: 1.0000 | ENEMA | Freq: Once | RECTAL | Status: DC | PRN

## 2018-03-04 MED ORDER — OXYCODONE HCL 5 MG PO TABS: 5.0000 mg | ORAL_TABLET | ORAL | Status: DC | PRN

## 2018-03-04 MED ORDER — POLYETHYLENE GLYCOL 3350 17 G PO PACK: 17.0000 g | PACK | Freq: Every day | ORAL | Status: DC | PRN

## 2018-03-04 MED ORDER — MORPHINE SULFATE (PF) 2 MG/ML IV SOLN
2.0000 mg | INTRAVENOUS | Status: DC | PRN
  Administered 2018-03-04: 2 mg via INTRAVENOUS
  Filled 2018-03-04: qty 1

## 2018-03-04 MED ORDER — AMLODIPINE BESYLATE 10 MG PO TABS
10.0000 mg | ORAL_TABLET | Freq: Every day | ORAL | Status: DC
  Administered 2018-03-04 – 2018-03-05 (×2): 10 mg via ORAL
  Filled 2018-03-04 (×2): qty 1

## 2018-03-04 MED ORDER — SUCCINYLCHOLINE CHLORIDE 200 MG/10ML IV SOSY
PREFILLED_SYRINGE | INTRAVENOUS | Status: AC
  Filled 2018-03-04: qty 10

## 2018-03-04 MED ORDER — DOCUSATE SODIUM 100 MG PO CAPS
100.0000 mg | ORAL_CAPSULE | Freq: Two times a day (BID) | ORAL | Status: DC
  Administered 2018-03-04 – 2018-03-05 (×2): 100 mg via ORAL
  Filled 2018-03-04 (×2): qty 1

## 2018-03-04 MED ORDER — BISACODYL 10 MG RE SUPP: 10.0000 mg | Freq: Every day | RECTAL | Status: DC | PRN

## 2018-03-04 MED ORDER — ONDANSETRON HCL 4 MG/2ML IJ SOLN
INTRAMUSCULAR | Status: AC
  Filled 2018-03-04: qty 2

## 2018-03-04 MED ORDER — LACTATED RINGERS IV SOLN
INTRAVENOUS | Status: DC
  Administered 2018-03-04 (×2): via INTRAVENOUS

## 2018-03-04 MED ORDER — INSULIN ASPART 100 UNIT/ML ~~LOC~~ SOLN
0.0000 [IU] | Freq: Three times a day (TID) | SUBCUTANEOUS | Status: DC
  Administered 2018-03-05: 5 [IU] via SUBCUTANEOUS

## 2018-03-04 MED ORDER — PHENYLEPHRINE HCL 10 MG/ML IJ SOLN
INTRAMUSCULAR | Status: DC | PRN
  Administered 2018-03-04: 80 ug via INTRAVENOUS
  Administered 2018-03-04: 40 ug via INTRAVENOUS
  Administered 2018-03-04: 200 ug via INTRAVENOUS
  Administered 2018-03-04: 160 ug via INTRAVENOUS

## 2018-03-04 MED ORDER — SODIUM CHLORIDE 0.9% FLUSH: 3.0000 mL | INTRAVENOUS | Status: DC | PRN

## 2018-03-04 MED ORDER — ATORVASTATIN CALCIUM 20 MG PO TABS
20.0000 mg | ORAL_TABLET | Freq: Every day | ORAL | Status: DC
  Administered 2018-03-04: 20 mg via ORAL
  Filled 2018-03-04: qty 1

## 2018-03-04 MED ORDER — HYDROMORPHONE HCL 1 MG/ML IJ SOLN: 0.2500 mg | INTRAMUSCULAR | Status: DC | PRN

## 2018-03-04 MED ORDER — FENTANYL CITRATE (PF) 250 MCG/5ML IJ SOLN
INTRAMUSCULAR | Status: AC
  Filled 2018-03-04: qty 5

## 2018-03-04 MED ORDER — ACETAMINOPHEN 650 MG RE SUPP: 650.0000 mg | RECTAL | Status: DC | PRN

## 2018-03-04 MED ORDER — BUPIVACAINE HCL (PF) 0.5 % IJ SOLN
INTRAMUSCULAR | Status: AC
  Filled 2018-03-04: qty 30

## 2018-03-04 MED ORDER — MENTHOL 3 MG MT LOZG: 1.0000 | LOZENGE | OROMUCOSAL | Status: DC | PRN

## 2018-03-04 MED ORDER — GELATIN ABSORBABLE MT POWD
OROMUCOSAL | Status: DC | PRN
  Administered 2018-03-04 (×2): 5 mL via TOPICAL

## 2018-03-04 MED ORDER — ALUM & MAG HYDROXIDE-SIMETH 200-200-20 MG/5ML PO SUSP: 30.0000 mL | Freq: Four times a day (QID) | ORAL | Status: DC | PRN

## 2018-03-04 MED ORDER — DEXAMETHASONE SODIUM PHOSPHATE 10 MG/ML IJ SOLN
INTRAMUSCULAR | Status: AC
  Filled 2018-03-04: qty 1

## 2018-03-04 MED ORDER — LIDOCAINE-EPINEPHRINE 1 %-1:100000 IJ SOLN
INTRAMUSCULAR | Status: DC | PRN
  Administered 2018-03-04: 4 mL

## 2018-03-04 MED ORDER — PROPOFOL 10 MG/ML IV BOLUS
INTRAVENOUS | Status: DC | PRN
  Administered 2018-03-04: 150 mg via INTRAVENOUS

## 2018-03-04 MED ORDER — ONDANSETRON HCL 4 MG/2ML IJ SOLN: 4.0000 mg | Freq: Four times a day (QID) | INTRAMUSCULAR | Status: DC | PRN

## 2018-03-04 MED ORDER — CEFAZOLIN SODIUM-DEXTROSE 2-4 GM/100ML-% IV SOLN
2.0000 g | INTRAVENOUS | Status: AC
  Administered 2018-03-04: 2 g via INTRAVENOUS

## 2018-03-04 MED ORDER — OXYCODONE-ACETAMINOPHEN 10-325 MG PO TABS: 1.0000 | ORAL_TABLET | ORAL | Status: DC | PRN

## 2018-03-04 MED ORDER — LINAGLIPTIN 5 MG PO TABS
5.0000 mg | ORAL_TABLET | Freq: Every day | ORAL | Status: DC
  Administered 2018-03-04 – 2018-03-05 (×2): 5 mg via ORAL
  Filled 2018-03-04 (×2): qty 1

## 2018-03-04 SURGICAL SUPPLY — 77 items
ADH SKN CLS APL DERMABOND .7 (GAUZE/BANDAGES/DRESSINGS) ×1
BASKET BONE COLLECTION (BASKET) ×1 IMPLANT
BIT DRILL 14X2.5XNS TI ANT (BIT) ×1 IMPLANT
BIT DRILL AVIATOR 14 (BIT) ×2
BIT DRILL NEURO 2X3.1 SFT TUCH (MISCELLANEOUS) ×1 IMPLANT
BIT DRL 14X2.5XNS TI ANT (BIT) ×1
BLADE ULTRA TIP 2M (BLADE) IMPLANT
BNDG GAUZE ELAST 4 BULKY (GAUZE/BANDAGES/DRESSINGS) ×2 IMPLANT
BUR BARREL STRAIGHT FLUTE 4.0 (BURR) ×2 IMPLANT
CANISTER SUCT 3000ML PPV (MISCELLANEOUS) ×2 IMPLANT
CARTRIDGE OIL MAESTRO DRILL (MISCELLANEOUS) ×1 IMPLANT
COVER MAYO STAND STRL (DRAPES) ×2 IMPLANT
DECANTER SPIKE VIAL GLASS SM (MISCELLANEOUS) ×2 IMPLANT
DERMABOND ADVANCED (GAUZE/BANDAGES/DRESSINGS) ×1
DERMABOND ADVANCED .7 DNX12 (GAUZE/BANDAGES/DRESSINGS) ×1 IMPLANT
DIFFUSER DRILL AIR PNEUMATIC (MISCELLANEOUS) ×2 IMPLANT
DRAIN JACKSON PRATT 10MM FLAT (MISCELLANEOUS) ×1 IMPLANT
DRAPE HALF SHEET 40X57 (DRAPES) IMPLANT
DRAPE LAPAROTOMY 100X72 PEDS (DRAPES) ×2 IMPLANT
DRAPE MICROSCOPE LEICA (MISCELLANEOUS) ×2 IMPLANT
DRILL NEURO 2X3.1 SOFT TOUCH (MISCELLANEOUS) ×2
DRSG OPSITE POSTOP 4X6 (GAUZE/BANDAGES/DRESSINGS) ×2 IMPLANT
DURAPREP 6ML APPLICATOR 50/CS (WOUND CARE) ×2 IMPLANT
ELECT COATED BLADE 2.86 ST (ELECTRODE) ×2 IMPLANT
ELECT REM PT RETURN 9FT ADLT (ELECTROSURGICAL) ×2
ELECTRODE REM PT RTRN 9FT ADLT (ELECTROSURGICAL) ×1 IMPLANT
EVACUATOR SILICONE 100CC (DRAIN) ×1 IMPLANT
GAUZE SPONGE 4X4 12PLY STRL (GAUZE/BANDAGES/DRESSINGS) IMPLANT
GAUZE SPONGE 4X4 16PLY XRAY LF (GAUZE/BANDAGES/DRESSINGS) IMPLANT
GLOVE BIO SURGEON STRL SZ8 (GLOVE) ×2 IMPLANT
GLOVE BIOGEL PI IND STRL 6.5 (GLOVE) IMPLANT
GLOVE BIOGEL PI IND STRL 7.0 (GLOVE) IMPLANT
GLOVE BIOGEL PI IND STRL 7.5 (GLOVE) IMPLANT
GLOVE BIOGEL PI IND STRL 8 (GLOVE) ×1 IMPLANT
GLOVE BIOGEL PI IND STRL 8.5 (GLOVE) ×1 IMPLANT
GLOVE BIOGEL PI INDICATOR 6.5 (GLOVE) ×2
GLOVE BIOGEL PI INDICATOR 7.0 (GLOVE) ×1
GLOVE BIOGEL PI INDICATOR 7.5 (GLOVE) ×1
GLOVE BIOGEL PI INDICATOR 8 (GLOVE) ×2
GLOVE BIOGEL PI INDICATOR 8.5 (GLOVE) ×1
GLOVE ECLIPSE 7.5 STRL STRAW (GLOVE) ×1 IMPLANT
GLOVE ECLIPSE 8.0 STRL XLNG CF (GLOVE) ×2 IMPLANT
GLOVE EXAM NITRILE LRG STRL (GLOVE) IMPLANT
GLOVE EXAM NITRILE XL STR (GLOVE) IMPLANT
GLOVE EXAM NITRILE XS STR PU (GLOVE) IMPLANT
GLOVE SURG SS PI 6.0 STRL IVOR (GLOVE) ×3 IMPLANT
GOWN STRL REUS W/ TWL LRG LVL3 (GOWN DISPOSABLE) IMPLANT
GOWN STRL REUS W/ TWL XL LVL3 (GOWN DISPOSABLE) ×3 IMPLANT
GOWN STRL REUS W/TWL 2XL LVL3 (GOWN DISPOSABLE) ×2 IMPLANT
GOWN STRL REUS W/TWL LRG LVL3 (GOWN DISPOSABLE) ×6
GOWN STRL REUS W/TWL XL LVL3 (GOWN DISPOSABLE) ×6
HALTER HD/CHIN CERV TRACTION D (MISCELLANEOUS) ×2 IMPLANT
HEMOSTAT POWDER KIT SURGIFOAM (HEMOSTASIS) ×3 IMPLANT
KIT BASIN OR (CUSTOM PROCEDURE TRAY) ×2 IMPLANT
KIT TURNOVER KIT B (KITS) ×2 IMPLANT
NDL HYPO 25X1 1.5 SAFETY (NEEDLE) ×1 IMPLANT
NDL SPNL 22GX3.5 QUINCKE BK (NEEDLE) ×1 IMPLANT
NEEDLE HYPO 25X1 1.5 SAFETY (NEEDLE) ×2 IMPLANT
NEEDLE SPNL 22GX3.5 QUINCKE BK (NEEDLE) ×4 IMPLANT
NS IRRIG 1000ML POUR BTL (IV SOLUTION) ×2 IMPLANT
OIL CARTRIDGE MAESTRO DRILL (MISCELLANEOUS) ×2
PACK LAMINECTOMY NEURO (CUSTOM PROCEDURE TRAY) ×2 IMPLANT
PAD ARMBOARD 7.5X6 YLW CONV (MISCELLANEOUS) ×6 IMPLANT
PEEK SPACER AVS AS 7X14X16X4% (Peek) ×2 IMPLANT
PIN DISTRACTION 14MM (PIN) ×4 IMPLANT
PLATE AVIATOR ASSY 1LVL SZ 12 (Plate) ×2 IMPLANT
PLATE AVIATOR ASSY 1LVL SZ 14 (Plate) ×1 IMPLANT
RUBBERBAND STERILE (MISCELLANEOUS) ×4 IMPLANT
SCREW AVIATOR VAR SELFTAP 4X14 (Screw) ×8 IMPLANT
SPONGE INTESTINAL PEANUT (DISPOSABLE) ×3 IMPLANT
SPONGE SURGIFOAM ABS GEL SZ50 (HEMOSTASIS) IMPLANT
STAPLER SKIN PROX WIDE 3.9 (STAPLE) ×1 IMPLANT
SUT VIC AB 3-0 SH 8-18 (SUTURE) ×4 IMPLANT
TOWEL GREEN STERILE (TOWEL DISPOSABLE) ×2 IMPLANT
TOWEL GREEN STERILE FF (TOWEL DISPOSABLE) ×2 IMPLANT
TUBE CONNECTING 20X1/4 (TUBING) ×2 IMPLANT
WATER STERILE IRR 1000ML POUR (IV SOLUTION) ×2 IMPLANT

## 2018-03-04 NOTE — Op Note (Signed)
03/04/2018  3:59 PM  PATIENT:  Larry Robertson  77 y.o. 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 and C 67 levels  POST-OPERATIVE DIAGNOSIS:  Herniated nucleus pulposus, Cervical with stenosis, radiculopathy, cervicalgia C 34 and C 67 levels  PROCEDURE:  Procedure(s) with comments: Cervical Three-Four, Cervical Six-Seven Right Side approach, Anterior cervical decompression/discectomy/fusion (Right) - right side anterior approach with PEEK cages, autograft, plates  SURGEON:  Surgeon(s) and Role:    Erline Levine, MD - Primary    * Jovita Gamma, MD - Assisting  PHYSICIAN ASSISTANT:   ASSISTANTS: P[place]at, RN   ANESTHESIA:   general  EBL:  100 mL   BLOOD ADMINISTERED:none  DRAINS: (10) Jackson-Pratt drain(s) with closed bulb suction in the prevertebral space   LOCAL MEDICATIONS USED:  MARCAINE    and LIDOCAINE   SPECIMEN:  No Specimen  DISPOSITION OF SPECIMEN:  N/A  COUNTS:  YES  TOURNIQUET:  * No tourniquets in log *  DICTATION: DICTATION: Patient is 76 year old male with herniation of C3 on 4 with stenosis, radiculopathy, cord compression, HNP and disc herniation at C 67.  It was elected to take him to surgery for anterior cervical decompression and fusion C 34 level and C 67 level.  He had previously undergone ACDF C 45 from the right.  PROCEDURE: Patient was brought to operating room and following the smooth and uncomplicated induction of general endotracheal anesthesia his head was placed on a horseshoe head holder he was placed in 5 pounds of Holter traction and his anterior neck was prepped and draped in usual sterile fashion. An incision was made on the right side of midline after infiltrating the skin and subcutaneous tissues with local lidocaine. The platysmal layer was incised and subplatysmal dissection was performed exposing the anterior border sternocleidomastoid muscle. Using blunt  dissection the carotid sheath was kept lateral and trachea and esophagus kept medial exposing the anterior cervical spine. A bent spinal needle was placed it was felt to be the C 34 and the C 67  levels and this was confirmed on intraoperative x-ray, although it was not possible to see the tip of the needle at the C 67 level. Longus coli muscles were taken down from the anterior cervical spine using electrocautery and key elevator and self-retaining retractor was placed exposing the C 67 level. The interspace was incised and a thorough discectomy was performed. Distraction pins were placed. Uncinate spurs and central spondylitic ridges were drilled down with a high-speed drill. The spinal cord dura and both C7 nerve roots were widely decompressed. Hemostasis was assured. After trial sizing a 7 mm lordotic large footprint PEEK cage was selected and packed with local autograft. This was tamped into position and countersunk appropriately. Distraction weight was removed. A 12 mm Stryker Aviator anterior cervical plate was affixed to the cervical spine with 14 mm variable-angle screws 2 at C6, 2 at C7. Longus coli muscles were taken down from the anterior cervical spine using electrocautery and key elevator and self-retaining retractor was placed exposing the C 34 level. The interspace was incised and a thorough discectomy was performed. Distraction pins were placed. Uncinate spurs and central spondylitic ridges were drilled down with a high-speed drill. The central spinal cord dura and both C4 nerve roots were widely decompressed. Hemostasis was assured. After trial sizing a 7 mm lordotic large footprint PEEK cage was selected and packed with local autograft. This was tamped into position and  countersunk appropriately. Distraction weight was removed. A 14 mm Stryker Aviator anterior cervical plate was affixed to the cervical spine with 14 mm variable-angle screws 2 at C3, 2 at C4.  All screws were well-positioned and  locking mechanisms were engaged. A final X ray was obtained which showed well positioned graft and anterior plate at C 34 without complicating features. Soft tissues were inspected and found to be in good repair. A #10 JP drain was inserted through a separate stab incision.   The wound was irrigated. The platysma layer was closed with 3-0 Vicryl stitches and the skin was reapproximated with 3-0 Vicryl subcuticular stitches. The wound was dressed with Dermabond. Counts were correct at the end of the case. Patient was extubated and taken to recovery in stable and satisfactory condition.   PLAN OF CARE: Admit to inpatient   PATIENT DISPOSITION:  PACU - hemodynamically stable.   Delay start of Pharmacological VTE agent (>24hrs) due to surgical blood loss or risk of bleeding: yes

## 2018-03-04 NOTE — Anesthesia Procedure Notes (Signed)
Procedure Name: Intubation Date/Time: 03/04/2018 12:59 PM Performed by: Lance Coon, CRNA Pre-anesthesia Checklist: Patient identified, Emergency Drugs available, Suction available, Patient being monitored and Timeout performed Patient Re-evaluated:Patient Re-evaluated prior to induction Oxygen Delivery Method: Circle system utilized Preoxygenation: Pre-oxygenation with 100% oxygen Induction Type: IV induction Ventilation: Mask ventilation without difficulty Laryngoscope Size: Glidescope and 4 Grade View: Grade I Tube type: Oral Tube size: 7.5 mm Number of attempts: 1 Airway Equipment and Method: Stylet and Video-laryngoscopy Placement Confirmation: ETT inserted through vocal cords under direct vision,  positive ETCO2 and breath sounds checked- equal and bilateral Secured at: 22 cm Tube secured with: Tape Dental Injury: Teeth and Oropharynx as per pre-operative assessment

## 2018-03-04 NOTE — Anesthesia Preprocedure Evaluation (Addendum)
Anesthesia Evaluation  Patient identified by MRN, date of birth, ID band Patient awake    Reviewed: Allergy & Precautions, NPO status , Patient's Chart, lab work & pertinent test results  Airway Mallampati: II  TM Distance: >3 FB Neck ROM: Full    Dental  (+) Upper Dentures, Lower Dentures   Pulmonary neg pulmonary ROS,    breath sounds clear to auscultation       Cardiovascular hypertension,  Rhythm:Regular Rate:Normal     Neuro/Psych  Neuromuscular disease negative psych ROS   GI/Hepatic GERD  ,(+) Hepatitis -, C  Endo/Other  diabetes, Type 2, Oral Hypoglycemic Agents  Renal/GU negative Renal ROS  negative genitourinary   Musculoskeletal  (+) Arthritis , Osteoarthritis,    Abdominal Normal abdominal exam  (+)   Peds  Hematology negative hematology ROS (+)   Anesthesia Other Findings   Reproductive/Obstetrics                            Lab Results  Component Value Date   WBC 10.4 02/24/2018   HGB 14.3 02/24/2018   HCT 42.0 02/24/2018   MCV 90.7 02/24/2018   PLT 183 02/24/2018   Lab Results  Component Value Date   CREATININE 1.06 02/24/2018   BUN 8 02/24/2018   NA 138 02/24/2018   K 3.2 (L) 02/24/2018   CL 99 (L) 02/24/2018   CO2 25 02/24/2018   Lab Results  Component Value Date   INR 1.0 07/13/2013   EKG: normal sinus rhythm.  Anesthesia Physical Anesthesia Plan  ASA: III  Anesthesia Plan: General   Post-op Pain Management:    Induction: Intravenous  PONV Risk Score and Plan: 3 and Ondansetron, Dexamethasone and Treatment may vary due to age or medical condition  Airway Management Planned: Oral ETT and Video Laryngoscope Planned  Additional Equipment: None  Intra-op Plan:   Post-operative Plan: Extubation in OR  Informed Consent: I have reviewed the patients History and Physical, chart, labs and discussed the procedure including the risks, benefits and  alternatives for the proposed anesthesia with the patient or authorized representative who has indicated his/her understanding and acceptance.   Dental advisory given  Plan Discussed with: CRNA  Anesthesia Plan Comments:        Anesthesia Quick Evaluation

## 2018-03-04 NOTE — Interval H&P Note (Signed)
History and Physical Interval Note:  03/04/2018 11:19 AM  Larry Robertson  has presented today for surgery, with the diagnosis of Herniated nucleus pulposus, Cervical  The various methods of treatment have been discussed with the patient and family. After consideration of risks, benefits and other options for treatment, the patient has consented to  Procedure(s) with comments: C3-4, C6-7 Right Side approach, Anterior cervical decompression/discectomy/fusion (Right) - C3-4, C6-7 Right Side approach, Anterior cervical decompression/discectomy/fusion as a surgical intervention .  The patient's history has been reviewed, patient examined, no change in status, stable for surgery.  I have reviewed the patient's chart and labs.  Questions were answered to the patient's satisfaction.     Kalsey Lull D

## 2018-03-04 NOTE — Transfer of Care (Signed)
Immediate Anesthesia Transfer of Care Note  Patient: Larry Robertson  Procedure(s) Performed: Cervical Three-Four, Cervical Six-Seven Right Side approach, Anterior cervical decompression/discectomy/fusion (Right Spine Cervical)  Patient Location: PACU  Anesthesia Type:General  Level of Consciousness: awake and patient cooperative  Airway & Oxygen Therapy: Patient Spontanous Breathing  Post-op Assessment: Report given to RN and Post -op Vital signs reviewed and stable  Post vital signs: Reviewed and stable  Last Vitals:  Vitals Value Taken Time  BP 139/92 03/04/2018  4:03 PM  Temp 36.1 C 03/04/2018  4:03 PM  Pulse 104 03/04/2018  4:05 PM  Resp 20 03/04/2018  4:05 PM  SpO2 93 % 03/04/2018  4:05 PM  Vitals shown include unvalidated device data.  Last Pain:  Vitals:   03/04/18 1130  TempSrc: Oral         Complications: No apparent anesthesia complications

## 2018-03-04 NOTE — Plan of Care (Signed)
  Problem: Nutrition: Goal: Adequate nutrition will be maintained Outcome: Progressing   Problem: Elimination: Goal: Will not experience complications related to bowel motility Outcome: Progressing   Problem: Pain Managment: Goal: General experience of comfort will improve Outcome: Progressing   Problem: Safety: Goal: Ability to remain free from injury will improve Outcome: Progressing   

## 2018-03-04 NOTE — Brief Op Note (Signed)
03/04/2018  3:59 PM  PATIENT:  Larry Robertson  77 y.o. 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 and C 67 levels  POST-OPERATIVE DIAGNOSIS:  Herniated nucleus pulposus, Cervical with stenosis, radiculopathy, cervicalgia C 34 and C 67 levels  PROCEDURE:  Procedure(s) with comments: Cervical Three-Four, Cervical Six-Seven Right Side approach, Anterior cervical decompression/discectomy/fusion (Right) - right side anterior approach with PEEK cages, autograft, plates  SURGEON:  Surgeon(s) and Role:    Erline Levine, MD - Primary    * Jovita Gamma, MD - Assisting  PHYSICIAN ASSISTANT:   ASSISTANTS: Poteat, RN   ANESTHESIA:   general  EBL:  100 mL   BLOOD ADMINISTERED:none  DRAINS: (10) Jackson-Pratt drain(s) with closed bulb suction in the prevertebral space   LOCAL MEDICATIONS USED:  MARCAINE    and LIDOCAINE   SPECIMEN:  No Specimen  DISPOSITION OF SPECIMEN:  N/A  COUNTS:  YES  TOURNIQUET:  * No tourniquets in log *  DICTATION: DICTATION: Patient is 77 year old male with herniation of C3 on 4 with stenosis, radiculopathy, cord compression, HNP and disc herniation at C 67.  It was elected to take him to surgery for anterior cervical decompression and fusion C 34 level and C 67 level.  He had previously undergone ACDF C 45 from the right.  PROCEDURE: Patient was brought to operating room and following the smooth and uncomplicated induction of general endotracheal anesthesia his head was placed on a horseshoe head holder he was placed in 5 pounds of Holter traction and his anterior neck was prepped and draped in usual sterile fashion. An incision was made on the right side of midline after infiltrating the skin and subcutaneous tissues with local lidocaine. The platysmal layer was incised and subplatysmal dissection was performed exposing the anterior border sternocleidomastoid muscle. Using blunt  dissection the carotid sheath was kept lateral and trachea and esophagus kept medial exposing the anterior cervical spine. A bent spinal needle was placed it was felt to be the C 34 and the C 67  levels and this was confirmed on intraoperative x-ray, although it was not possible to see the tip of the needle at the C 67 level. Longus coli muscles were taken down from the anterior cervical spine using electrocautery and key elevator and self-retaining retractor was placed exposing the C 67 level. The interspace was incised and a thorough discectomy was performed. Distraction pins were placed. Uncinate spurs and central spondylitic ridges were drilled down with a high-speed drill. The spinal cord dura and both C7 nerve roots were widely decompressed. Hemostasis was assured. After trial sizing a 7 mm lordotic large footprint PEEK cage was selected and packed with local autograft. This was tamped into position and countersunk appropriately. Distraction weight was removed. A 12 mm Stryker Aviator anterior cervical plate was affixed to the cervical spine with 14 mm variable-angle screws 2 at C6, 2 at C7. Longus coli muscles were taken down from the anterior cervical spine using electrocautery and key elevator and self-retaining retractor was placed exposing the C 34 level. The interspace was incised and a thorough discectomy was performed. Distraction pins were placed. Uncinate spurs and central spondylitic ridges were drilled down with a high-speed drill. The central spinal cord dura and both C4 nerve roots were widely decompressed. Hemostasis was assured. After trial sizing a 7 mm lordotic large footprint PEEK cage was selected and packed with local autograft. This was tamped into position and  countersunk appropriately. Distraction weight was removed. A 14 mm Stryker Aviator anterior cervical plate was affixed to the cervical spine with 14 mm variable-angle screws 2 at C3, 2 at C4.  All screws were well-positioned and  locking mechanisms were engaged. A final X ray was obtained which showed well positioned graft and anterior plate at C 34 without complicating features. Soft tissues were inspected and found to be in good repair. A #10 JP drain was inserted through a separate stab incision.   The wound was irrigated. The platysma layer was closed with 3-0 Vicryl stitches and the skin was reapproximated with 3-0 Vicryl subcuticular stitches. The wound was dressed with Dermabond. Counts were correct at the end of the case. Patient was extubated and taken to recovery in stable and satisfactory condition.   PLAN OF CARE: Admit to inpatient   PATIENT DISPOSITION:  PACU - hemodynamically stable.   Delay start of Pharmacological VTE agent (>24hrs) due to surgical blood loss or risk of bleeding: yes

## 2018-03-04 NOTE — Progress Notes (Signed)
Orthopedic Tech Progress Note Patient Details:  Larry Robertson 04/18/1941 [id number]  Ortho Devices Type of Ortho Device: Soft collar Ortho Device/Splint Location: neck Ortho Device/Splint Interventions: Ordered, Application   Post Interventions Patient Tolerated: Well Instructions Provided: Care of device   Braulio Bosch 03/04/2018, 4:54 PM

## 2018-03-05 ENCOUNTER — Encounter (HOSPITAL_COMMUNITY): Payer: Self-pay | Admitting: Neurosurgery

## 2018-03-05 DIAGNOSIS — M1990 Unspecified osteoarthritis, unspecified site: Secondary | ICD-10-CM | POA: Diagnosis not present

## 2018-03-05 DIAGNOSIS — M5412 Radiculopathy, cervical region: Secondary | ICD-10-CM | POA: Diagnosis not present

## 2018-03-05 DIAGNOSIS — Z7984 Long term (current) use of oral hypoglycemic drugs: Secondary | ICD-10-CM | POA: Diagnosis not present

## 2018-03-05 DIAGNOSIS — M4120 Other idiopathic scoliosis, site unspecified: Secondary | ICD-10-CM | POA: Diagnosis not present

## 2018-03-05 DIAGNOSIS — M5020 Other cervical disc displacement, unspecified cervical region: Secondary | ICD-10-CM | POA: Diagnosis not present

## 2018-03-05 DIAGNOSIS — Z79899 Other long term (current) drug therapy: Secondary | ICD-10-CM | POA: Diagnosis not present

## 2018-03-05 DIAGNOSIS — G709 Myoneural disorder, unspecified: Secondary | ICD-10-CM | POA: Diagnosis not present

## 2018-03-05 DIAGNOSIS — Z8619 Personal history of other infectious and parasitic diseases: Secondary | ICD-10-CM | POA: Diagnosis not present

## 2018-03-05 DIAGNOSIS — M479 Spondylosis, unspecified: Secondary | ICD-10-CM | POA: Diagnosis not present

## 2018-03-05 DIAGNOSIS — I700 Atherosclerosis of aorta: Secondary | ICD-10-CM | POA: Diagnosis not present

## 2018-03-05 DIAGNOSIS — M4802 Spinal stenosis, cervical region: Secondary | ICD-10-CM | POA: Diagnosis not present

## 2018-03-05 DIAGNOSIS — Z981 Arthrodesis status: Secondary | ICD-10-CM | POA: Diagnosis not present

## 2018-03-05 DIAGNOSIS — I10 Essential (primary) hypertension: Secondary | ICD-10-CM | POA: Diagnosis not present

## 2018-03-05 DIAGNOSIS — Z888 Allergy status to other drugs, medicaments and biological substances status: Secondary | ICD-10-CM | POA: Diagnosis not present

## 2018-03-05 LAB — GLUCOSE, CAPILLARY: Glucose-Capillary: 228 mg/dL — ABNORMAL HIGH (ref 65–99)

## 2018-03-05 MED ORDER — OXYCODONE-ACETAMINOPHEN 10-325 MG PO TABS: 1.0000 | ORAL_TABLET | ORAL | 0 refills | Status: DC | PRN

## 2018-03-05 MED ORDER — METHOCARBAMOL 500 MG PO TABS: 500.0000 mg | ORAL_TABLET | Freq: Four times a day (QID) | ORAL | 1 refills | Status: DC | PRN

## 2018-03-05 MED FILL — Gelatin Absorbable MT Powder: OROMUCOSAL | Qty: 1 | Status: AC

## 2018-03-05 MED FILL — Thrombin For Soln 5000 Unit: CUTANEOUS | Qty: 5000 | Status: AC

## 2018-03-05 NOTE — Discharge Summary (Signed)
Physician Discharge Summary  Patient ID: Larry Robertson MRN: [id number] DOB/AGE: 78/28/1942 78 y.o.  Admit date: 03/04/2018 Discharge date: 03/05/2018  Admission Diagnoses: Herniated nucleus pulposus, Cervical with stenosis, radiculopathy, cervicalgia C 34 and C 67 levels    Discharge Diagnoses: Herniated nucleus pulposus, Cervical with stenosis, radiculopathy, cervicalgia C 34 and C 67 levels  S/p Cervical Three-Four, Cervical Six-Seven Right Side approach, Anterior cervical decompression/discectomy/fusion (Right) - right side anterior approach with PEEK cages, autograft, plates    Active Problems:   Herniated cervical intervertebral disc   Discharged Condition: good  Hospital Course: Larry Robertson was admitted for surgery with dx HNP, stenosis, and radiculopathy. Following uncomplicated ACDF W4-3 and C6-7, he recovered nicely. Pt has mobilized well, with full strength BUE.   Consults: None  Significant Diagnostic Studies: radiology: X-Ray: intra-op  Treatments: surgery: Cervical Three-Four, Cervical Six-Seven Right Side approach, Anterior cervical decompression/discectomy/fusion (Right) - right side anterior approach with PEEK cages, autograft, plates    Discharge Exam: Blood pressure (!) 154/95, pulse 93, temperature 97.6 F (36.4 C), temperature source Oral, resp. rate 18, SpO2 96 %. Alert, conversant. Posterior cervical and shoulder soreness as expected. Incision without erythema, swelling or drainage beneath honeycomb and dermabond. JP ~24ml overnight. Good strength BUE including hand intrinsics.    Disposition:  Discharge to home. JP pulled, DSD to siste on Dr. Melven Sartorius order. Pt tolerated without complaint. Pt verbalizes understanding of d/c instructions. Rx's printed by DrStern for prn home use. Office f/u already sheduled.      Allergies as of 03/05/2018      Reactions   Gabapentin Rash, Other (See Comments)   Diplopia (double vision)       Medication List    TAKE these medications   allopurinol 300 MG tablet Commonly known as:  ZYLOPRIM Take 300 mg by mouth daily as needed (for gout flares ups.).   amLODipine 10 MG tablet Commonly known as:  NORVASC Take 1 tablet (10 mg total) by mouth daily.   atorvastatin 20 MG tablet Commonly known as:  LIPITOR Take 1 tablet (20 mg total) at bedtime by mouth.   colchicine 0.6 MG tablet 1 tablet every hour as needed for gout, not to exceed 6/day What changed:    how much to take  how to take this  when to take this  reasons to take this  additional instructions   DULoxetine 60 MG capsule Commonly known as:  CYMBALTA Take 1 capsule (60 mg total) by mouth daily.   losartan 100 MG tablet Commonly known as:  COZAAR TAKE 1 TABLET (100 MG TOTAL) BY MOUTH DAILY.   metFORMIN 1000 MG tablet Commonly known as:  GLUCOPHAGE TAKE 1 TABLET BY MOUTH 2 TIMES DAILY WITH A MEAL.   methocarbamol 500 MG tablet Commonly known as:  ROBAXIN Take 1 tablet (500 mg total) by mouth every 6 (six) hours as needed for muscle spasms.   oxyCODONE-acetaminophen 10-325 MG tablet Commonly known as:  PERCOCET Take 1 tablet by mouth every 4 (four) hours as needed for pain. Please make available 04/23/18   repaglinide 1 MG tablet Commonly known as:  PRANDIN Take 1 tablet (1 mg total) by mouth 3 (three) times daily before meals.   rosuvastatin 5 MG tablet Commonly known as:  CRESTOR Take 1 tablet (5 mg total) daily by mouth.   sitaGLIPtin 100 MG tablet Commonly known as:  JANUVIA Take 1 tablet (100 mg total) by mouth daily.   traZODone 100 MG tablet Commonly known as:  DESYREL  Take 1 tablet (100 mg total) by mouth at bedtime.        Signed: Peggyann Shoals, MD 03/05/2018, 11:04 AM

## 2018-03-05 NOTE — Evaluation (Signed)
Occupational Therapy Evaluation Patient Details Name: Larry Robertson MRN: [id number] DOB: 04/04/1941 Today's Date: 03/05/2018    History of Present Illness s/p Anterior cervical decompression/discectomy/fusion (Right) 2 levels. PMH includes: HTN, NIDDM, hepatitis-C, ACDF September 2005.   Clinical Impression   Pt admitted with the above diagnoses and presents with below problem list. PTA pt was independent with ADLs. Pt is currently mostly setup to min guard with ADLs and functional transfers. Pt with 1 LOB turning at foot of bed needing assist to regain balance with pt seeking external support, pt cited ongoing issues with his legs. Pt min guard with in-room mobility remainder of session. ADL strategies and brace education provided. Plan is d/c home today.     Follow Up Recommendations  No OT follow up;Supervision - Intermittent    Equipment Recommendations  None recommended by OT;Other (comment)(pt declined shower seat)    Recommendations for Other Services PT consult     Precautions / Restrictions Precautions Precautions: Cervical Precaution Booklet Issued: Yes (comment) Required Braces or Orthoses: Cervical Brace Cervical Brace: Soft collar Restrictions Weight Bearing Restrictions: No      Mobility Bed Mobility Overal bed mobility: Modified Independent             General bed mobility comments: instructed in log roll. Pt ultimately completed more as supine>EOB. Reinstructed on benefit of log roll technique.  Transfers Overall transfer level: Needs assistance Equipment used: None Transfers: Sit to/from Stand Sit to Stand: Min guard         General transfer comment: from EOB to recliner    Balance Overall balance assessment: Needs assistance Sitting-balance support: No upper extremity supported;Feet supported Sitting balance-Leahy Scale: Good     Standing balance support: No upper extremity supported Standing balance-Leahy Scale: Fair Standing balance  comment: good static, fair dynamic. 1 LOB turning in room.                           ADL either performed or assessed with clinical judgement   ADL Overall ADL's : Needs assistance/impaired Eating/Feeding: Set up;Sitting   Grooming: Min guard;Standing   Upper Body Bathing: Set up;Sitting   Lower Body Bathing: Min guard;Sit to/from stand   Upper Body Dressing : Set up;Sitting   Lower Body Dressing: Min guard;Sit to/from stand   Toilet Transfer: Min guard;Ambulation   Toileting- Clothing Manipulation and Hygiene: Min guard;Sit to/from stand   Tub/ Shower Transfer: Tub transfer;Min guard;Ambulation   Functional mobility during ADLs: Min guard;Minimal assistance General ADL Comments: Pt completed bed and in-room functional mobility. Pt practiced donning brace. Pt mostly min guard walking in room but did have 1 LOB turning at foot of bed with assist provided to regain balance. ADL education provided.      Vision         Perception     Praxis      Pertinent Vitals/Pain Pain Assessment: Faces Faces Pain Scale: Hurts little more Pain Location: incisional Pain Intervention(s): Monitored during session     Hand Dominance Right   Extremity/Trunk Assessment Upper Extremity Assessment Upper Extremity Assessment: Overall WFL for tasks assessed;LUE deficits/detail LUE Deficits / Details: baseline pain with shoulder flexion past ~80*   Lower Extremity Assessment Lower Extremity Assessment: Defer to PT evaluation       Communication Communication Communication: No difficulties   Cognition Arousal/Alertness: Awake/alert Behavior During Therapy: WFL for tasks assessed/performed Overall Cognitive Status: Within Functional Limits for tasks assessed  General Comments       Exercises     Shoulder Instructions      Home Living Family/patient expects to be discharged to:: Private residence Living  Arrangements: Alone Available Help at Discharge: Available PRN/intermittently Type of Home: House Home Access: Level entry     Home Layout: One level     Bathroom Shower/Tub: Tub/shower unit;Door         Home Equipment: None          Prior Functioning/Environment Level of Independence: Independent                 OT Problem List: Impaired balance (sitting and/or standing);Decreased knowledge of use of DME or AE;Decreased knowledge of precautions;Pain      OT Treatment/Interventions:      OT Goals(Current goals can be found in the care plan section) Acute Rehab OT Goals Patient Stated Goal: home  OT Frequency:     Barriers to D/C:            Co-evaluation              AM-PAC PT "6 Clicks" Daily Activity     Outcome Measure Help from another person eating meals?: None Help from another person taking care of personal grooming?: None Help from another person toileting, which includes using toliet, bedpan, or urinal?: None Help from another person bathing (including washing, rinsing, drying)?: A Little Help from another person to put on and taking off regular upper body clothing?: None Help from another person to put on and taking off regular lower body clothing?: None 6 Click Score: 23   End of Session Equipment Utilized During Treatment: Cervical collar  Activity Tolerance: Patient tolerated treatment well Patient left: in chair;with call bell/phone within reach  OT Visit Diagnosis: Unsteadiness on feet (R26.81);Pain                Time: 1610-9604 OT Time Calculation (min): 20 min Charges:  OT General Charges $OT Visit: 1 Visit OT Evaluation $OT Eval Low Complexity: 1 Low G-Codes:       [name] 03/05/2018, 9:56 AM

## 2018-03-05 NOTE — Progress Notes (Addendum)
Subjective: Patient reports "I feel pretty good. Sore in my neck and shoulders"  Objective: Vital signs in last 24 hours: Temp:  [97 F (36.1 C)-98.5 F (36.9 C)] 97.6 F (36.4 C) (04/05 0546) Pulse Rate:  [88-104] 93 (04/05 0546) Resp:  [14-22] 18 (04/05 0546) BP: (130-168)/(85-102) 154/95 (04/05 0546) SpO2:  [88 %-96 %] 96 % (04/05 0546)  Intake/Output from previous day: 04/04 0701 - 04/05 0700 In: 2675 [I.V.:2475; IV Piggyback:200] Out: 140 [Drains:40; Blood:100] Intake/Output this shift: No intake/output data recorded.  Alert, conversant. Posterior cervical and shoulder soreness as expected. Incision without erythema, swelling or drainage beneath honeycomb and dermabond. JP ~11ml overnight. Good strength BUE including hand intrinsics.   Lab Results: No results for input(s): WBC, HGB, HCT, PLT in the last 72 hours. BMET No results for input(s): NA, K, CL, CO2, GLUCOSE, BUN, CREATININE, CALCIUM in the last 72 hours.  Studies/Results: Dg Cervical Spine 2-3 Views  Result Date: 03/04/2018 CLINICAL DATA:  Status post surgical anterior fusion. EXAM: CERVICAL SPINE - 2-3 VIEW COMPARISON:  MRI of January 20, 2018. FINDINGS: Three intraoperative cross-table lateral projections of the cervical spine were obtained. First and second images demonstrate surgical probe directed toward the anterior portion of C3-4 disc space. Final image demonstrates patient be status post surgical anterior fusion of C3-4. IMPRESSION: Status post surgical anterior fusion of C3-4. Electronically Signed   By: Marijo Conception, M.D.   On: 03/04/2018 15:46    Assessment/Plan: Improved  LOS: 1 day  Discharge to home per DrStern. JP pulled, DSD to siste on Dr. Melven Sartorius order. Pt tolerated without complaint. Pt verbalizes understanding of d/c instructions. Rx's printed by DrStern for prn home use. Office f/u already sheduled.    Verdis Prime 03/05/2018, 8:44 AM  Patient is doing well.  Discharge home.

## 2018-03-05 NOTE — Evaluation (Signed)
Physical Therapy Evaluation Patient Details Name: Larry Robertson MRN: [id number] DOB: 10-Aug-1941 Today's Date: 03/05/2018   History of Present Illness  Pt presents to [facility] on 03/04/18 for ACDF (Right) C3/4 & C6/7. PMH includes: HTN, NIDDM, hepatitis-C, ACDF September 2005. Pt reports LUE constant pain prior to surgery since prior fusion in 2005, but he did have return of some motor loss after that surgery. Pt live alone, full yindepdent cumminty swellign adult without recent falls histoy, without need for assistive device.   Clinical Impression  Pt admitted with above diagnosis. Pt currently with functional limitations due to the deficits listed below (see "PT Problem List"). Upon entry, the patient is received up to chair, no family/caregiver present. The pt is awake and agreeable to participate. No acute distress noted at this time. The pt is alert and oriented x3, pleasant, conversational, and following simple and multi-step commands consistently. Functional mobility assessment demonstrates no acute impairment, transfers and 350+ft AMB performed independently, gait speed indicative of intervention needed to reduce risk of falls. Pt attests to no acute limitations. Patient is at baseline level of assistance for mobility, all education completed, and time is given to address all questions/concerns. No additional skilled PT services needed at this time, PT signing off. PT recommends daily ambulation ad lib or with nursing staff as needed to prevent deconditioning.      Follow Up Recommendations Follow surgeon's recommendation for DC plan and follow-up therapies;Outpatient PT;Supervision - Intermittent    Equipment Recommendations  None recommended by PT    Recommendations for Other Services       Precautions / Restrictions Precautions Precautions: Cervical Precaution Booklet Issued: Yes (comment) Required Braces or Orthoses: Cervical Brace Cervical Brace: Soft collar Restrictions Weight Bearing  Restrictions: No      Mobility  Bed Mobility Overal bed mobility: Modified Independent             General bed mobility comments: received up in bed   Transfers Overall transfer level: Independent Equipment used: None Transfers: Sit to/from Stand Sit to Stand: Min guard         General transfer comment: from EOB to recliner  Ambulation/Gait   Ambulation Distance (Feet): 360 Feet Assistive device: None Gait Pattern/deviations: WFL(Within Functional Limits) Gait velocity: 0.31ms  Gait velocity interpretation: at or above normal speed for age/gender    Stairs            Wheelchair Mobility    Modified Rankin (Stroke Patients Only)       Balance Overall balance assessment: Independent;No apparent balance deficits (not formally assessed) Sitting-balance support: No upper extremity supported;Feet supported Sitting balance-Leahy Scale: Good     Standing balance support: No upper extremity supported Standing balance-Leahy Scale: Fair Standing balance comment: good static, fair dynamic. 1 LOB turning in room.                             Pertinent Vitals/Pain Pain Assessment: 0-10 Pain Score: 6  Faces Pain Scale: Hurts little more Pain Location: surgical site  Pain Intervention(s): Limited activity within patient's tolerance;Monitored during session    HCarrollexpects to be discharged to:: Private residence Living Arrangements: Alone Available Help at Discharge: Available PRN/intermittently Type of Home: House Home Access: Stairs to enter   [employer] Steps: 1 step to enter Home Layout: One level Home Equipment: None      Prior Function Level of Independence: Independent  Hand Dominance   Dominant Hand: Right    Extremity/Trunk Assessment   Upper Extremity Assessment Upper Extremity Assessment: Overall WFL for tasks assessed;LUE deficits/detail LUE Deficits / Details:  baseline pain with shoulder flexion past ~80*    Lower Extremity Assessment Lower Extremity Assessment: Overall WFL for tasks assessed    Cervical / Trunk Assessment Cervical / Trunk Assessment: Normal  Communication   Communication: No difficulties  Cognition Arousal/Alertness: Awake/alert Behavior During Therapy: WFL for tasks assessed/performed Overall Cognitive Status: Within Functional Limits for tasks assessed                                        General Comments      Exercises     Assessment/Plan    PT Assessment All further PT needs can be met in the next venue of care  PT Problem List Decreased range of motion;Decreased strength;Pain;Decreased activity tolerance       PT Treatment Interventions      PT Goals (Current goals can be found in the Care Plan section)  Acute Rehab PT Goals Patient Stated Goal: home PT Goal Formulation: All assessment and education complete, DC therapy    Frequency     Barriers to discharge        Co-evaluation               AM-PAC PT "6 Clicks" Daily Activity  Outcome Measure Difficulty turning over in bed (including adjusting bedclothes, sheets and blankets)?: None Difficulty moving from lying on back to sitting on the side of the bed? : A Little Difficulty sitting down on and standing up from a chair with arms (e.g., wheelchair, bedside commode, etc,.)?: None Help needed moving to and from a bed to chair (including a wheelchair)?: None Help needed walking in hospital room?: None Help needed climbing 3-5 steps with a railing? : None 6 Click Score: 23    End of Session Equipment Utilized During Treatment: Gait belt;Cervical collar Activity Tolerance: Patient tolerated treatment well;No increased pain Patient left: in chair;with call bell/phone within reach   PT Visit Diagnosis: Difficulty in walking, not elsewhere classified (R26.2);Other symptoms and signs involving the nervous system (R29.898)     Time: 1004-1016 PT Time Calculation (min) (ACUTE ONLY): 12 min   Charges:   PT Evaluation $PT Eval Low Complexity: 1 Low     PT G Codes:        10:24 AM, 2018-03-08 Etta Grandchild, PT, DPT Physical Therapist - [place] [phone] (Pager)  [phone] (Office)      Elio Haden C Mar 08, 2018, 10:22 AM

## 2018-03-08 ENCOUNTER — Other Ambulatory Visit: Payer: Self-pay

## 2018-03-08 MED ORDER — TRAZODONE HCL 100 MG PO TABS: 100.0000 mg | ORAL_TABLET | Freq: Every day | ORAL | 1 refills | Status: DC

## 2018-03-08 NOTE — Anesthesia Postprocedure Evaluation (Signed)
Anesthesia Post Note  Patient: Larry Robertson  Procedure(s) Performed: Cervical Three-Four, Cervical Six-Seven Right Side approach, Anterior cervical decompression/discectomy/fusion (Right Spine Cervical)     Patient location during evaluation: PACU Anesthesia Type: General Level of consciousness: awake and alert Pain management: pain level controlled Vital Signs Assessment: post-procedure vital signs reviewed and stable Respiratory status: spontaneous breathing, nonlabored ventilation, respiratory function stable and patient connected to nasal cannula oxygen Cardiovascular status: blood pressure returned to baseline and stable Postop Assessment: no apparent nausea or vomiting Anesthetic complications: no    Last Vitals:  Vitals:   03/05/18 0118 03/05/18 0546  BP: (!) 147/88 (!) 154/95  Pulse: 95 93  Resp: 18 18  Temp: 36.6 C 36.4 C  SpO2: 95% 96%     Pain Goal:                 [name]

## 2018-03-10 ENCOUNTER — Ambulatory Visit (INDEPENDENT_AMBULATORY_CARE_PROVIDER_SITE_OTHER): Payer: Medicare Other | Admitting: Endocrinology

## 2018-03-10 ENCOUNTER — Encounter: Payer: Self-pay | Admitting: Endocrinology

## 2018-03-10 VITALS — BP 130/88 | HR 91 | Wt 213.8 lb

## 2018-03-10 DIAGNOSIS — M545 Low back pain, unspecified: Secondary | ICD-10-CM

## 2018-03-10 DIAGNOSIS — E119 Type 2 diabetes mellitus without complications: Secondary | ICD-10-CM | POA: Diagnosis not present

## 2018-03-10 NOTE — Progress Notes (Signed)
Subjective:    Patient ID: Larry Robertson, male    DOB: 1941-11-06, 77 y.o.   MRN: [id number]  HPI Pt returns for f/u of diabetes mellitus:  DM type: 2 Dx'ed: 2010.  Complications: none.  Therapy: 3 oral meds.  DKA: never.  Severe hypoglycemia: never.  Pancreatitis: never.  Other: edema and nocturia (he no longer takes d-DAVP), limit oral rx options; he has never taken insulin.   Interval history: he does not check cbg's.   He is out of pain meds, due to taking extra.  He has ongoing severe low back pain.  He had surgery last week.  Past Medical History:  Diagnosis Date  . Arthritis   . Blood transfusion without reported diagnosis   . Colon polyps 05/19/2012  . 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 DIABETES MELLITUS, TYPE II 08/25/2007  . Diastolic dysfunction    on echo  . Diverticulosis   . ERECTILE DYSFUNCTION 08/25/2007  . 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 Heart murmur    but was not stated by DR Hilty  . Hepatitis    took treatment  . HEPATITIS C 08/25/2007  . HYPERTENSION 08/25/2007  . 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 HYPOGONADISM 09/27/2009    Past Surgical History:  Procedure Laterality Date  . ABDOMINAL HERNIA REPAIR  2000  . ANTERIOR CERVICAL DECOMP/DISCECTOMY FUSION Right 03/04/2018   Procedure: Cervical Three-Four, Cervical Six-Seven Right Side approach, Anterior cervical decompression/discectomy/fusion;  Surgeon: Erline Levine, MD;  Location: [place];  Service: Neurosurgery;  Laterality: Right;  right side anterior approach  . CHOLECYSTECTOMY  1999  . COLONOSCOPY    . LUMBAR FUSION  2005  . TONSILLECTOMY AND ADENOIDECTOMY    . UPPER GASTROINTESTINAL ENDOSCOPY      Social History   Socioeconomic History  . Marital status: Single    Spouse name: Not on file  . Number of children: 7  . Years of education: Not on file  . Highest education level: Not on file  Occupational History  . 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 Financial resource strain: Not on file  . Food insecurity:    Worry: Not on file    Inability: Not on file  . Transportation needs:    Medical: Not on file    Non-medical: Not on file  Tobacco Use  . Smoking status: Never Smoker  . Smokeless tobacco: Never Used  Substance and Sexual Activity  . Alcohol use: Yes    Comment: 3-4 beers per week; no alcohol per pt as of 02-09-17 visit  . Drug use: No  . Sexual activity: Not on file  Lifestyle  . Physical activity:    Days per week: Not on file    Minutes per session: Not on file  . Stress: Not on file  Relationships  . Social connections:    Talks on phone: Not on file    Gets together: Not on file    Attends religious service: Not on file    Active member of club or organization: Not on file    Attends meetings of clubs or organizations: Not on file    Relationship status: Not on file  . Intimate partner violence:    Fear of current or ex partner: Not on file    Emotionally abused: Not on file    Physically abused: Not on file    Forced sexual activity: Not on file  Other Topics Concern  .  Not on file  Social History Narrative   Divorced    Current Outpatient Medications on File Prior to Visit  Medication Sig Dispense Refill  . allopurinol (ZYLOPRIM) 300 MG tablet Take 300 mg by mouth daily as needed (for gout flares ups.).     Marland Kitchen amLODipine (NORVASC) 10 MG tablet Take 1 tablet (10 mg total) by mouth daily. 90 tablet 1  . atorvastatin (LIPITOR) 20 MG tablet Take 1 tablet (20 mg total) at bedtime by mouth. 90 tablet 3  . colchicine 0.6 MG tablet 1 tablet every hour as needed for gout, not to exceed 6/day (Patient taking differently: Take 0.6 mg by mouth every hour as needed (for gout flare ups *do not exceed 6/day*). 1 tablet every hour as needed for gout, not to exceed 6/day) 6 tablet 4  . DULoxetine (CYMBALTA) 60 MG capsule Take 1 capsule (60 mg total) by mouth daily. 90 capsule 2  .  losartan (COZAAR) 100 MG tablet TAKE 1 TABLET (100 MG TOTAL) BY MOUTH DAILY. 90 tablet 3  . metFORMIN (GLUCOPHAGE) 1000 MG tablet TAKE 1 TABLET BY MOUTH 2 TIMES DAILY WITH A MEAL. 180 tablet 1  . methocarbamol (ROBAXIN) 500 MG tablet Take 1 tablet (500 mg total) by mouth every 6 (six) hours as needed for muscle spasms. 60 tablet 1  . oxyCODONE-acetaminophen (PERCOCET) 10-325 MG tablet Take 1 tablet by mouth every 4 (four) hours as needed for pain. Please make available 04/23/18 60 tablet 0  . repaglinide (PRANDIN) 1 MG tablet Take 1 tablet (1 mg total) by mouth 3 (three) times daily before meals. 90 tablet 11  . rosuvastatin (CRESTOR) 5 MG tablet Take 1 tablet (5 mg total) daily by mouth. (Patient not taking: Reported on 02/17/2018) 30 tablet 11  . sitaGLIPtin (JANUVIA) 100 MG tablet Take 1 tablet (100 mg total) by mouth daily. 90 tablet 3  . traZODone (DESYREL) 100 MG tablet Take 1 tablet (100 mg total) by mouth at bedtime. 90 tablet 1   No current facility-administered medications on file prior to visit.     Allergies  Allergen Reactions  . Gabapentin Rash and Other (See Comments)    Diplopia (double vision)    Family History  Problem Relation Age of Onset  . Diabetes Maternal Grandmother   . Cancer Neg Hx   . Colon cancer Neg Hx   . Esophageal cancer Neg Hx   . Rectal cancer Neg Hx   . Stomach cancer Neg Hx     BP 130/88 (BP Location: Right Arm, Patient Position: Sitting, Cuff Size: Normal)   Pulse 91   Wt 213 lb 12.8 oz (97 kg)   SpO2 95%   BMI 28.21 kg/m   Review of Systems He has lost a few lbs    Objective:   Physical Exam VITAL SIGNS:  See vs page GENERAL: no distress Pulses: dorsalis pedis intact bilat.   MSK: no deformity of the feet CV: no leg edema Skin:  no ulcer on the feet.  normal color and temp on the feet. Neuro: sensation is intact to touch on the feet.     Lab Results  Component Value Date   HGBA1C 6.1 (H) 02/24/2018       Assessment & Plan:    Type 2 DM: well-controlled Chronic pain syndrome: we discussed ref for other non-narcotic options.    Patient Instructions  Please see a PMR specialist.  you will receive a phone call, about a day and time for an appointment.  Please continue the same medications for diabetes.   Please come back for a follow-up appointment in 2-3 months.

## 2018-03-10 NOTE — Patient Instructions (Addendum)
Please see a PMR specialist.  you will receive a phone call, about a day and time for an appointment.   Please continue the same medications for diabetes.   Please come back for a follow-up appointment in 2-3 months.

## 2018-03-31 DIAGNOSIS — M5412 Radiculopathy, cervical region: Secondary | ICD-10-CM | POA: Diagnosis not present

## 2018-04-04 ENCOUNTER — Other Ambulatory Visit: Payer: Self-pay | Admitting: Endocrinology

## 2018-05-10 DIAGNOSIS — M542 Cervicalgia: Secondary | ICD-10-CM | POA: Diagnosis not present

## 2018-06-09 ENCOUNTER — Telehealth: Payer: Self-pay | Admitting: Endocrinology

## 2018-06-09 NOTE — Telephone Encounter (Signed)
Yes, we'll address at next pain mgmt visit.  However, does pt have a refill sent to pharmacy (I sent 3 times at last pain mgmt visit)?

## 2018-06-09 NOTE — Telephone Encounter (Signed)
I called patient & explained that paper copies were obsolete, so it would have to be sent electronically by Dr. Loanne Drilling. I stated that he may have to wait until next Tuesday when he had his next appointment. I explained I had forwarded Dr. Loanne Drilling messages & was waiting to hear if he just needed to come for pain management appointment.

## 2018-06-09 NOTE — Telephone Encounter (Signed)
Please advise? It appears that patient needs pain management visit?

## 2018-06-09 NOTE — Telephone Encounter (Signed)
Patient was notified and he stated an understanding

## 2018-06-09 NOTE — Telephone Encounter (Signed)
oxyCODONE-acetaminophen (PERCOCET) 10-325 MG tablet    Patient would like a prescription written for the above medication  Please advise

## 2018-06-09 NOTE — Telephone Encounter (Signed)
Patient does have appointment next Tuesday 7/16.

## 2018-06-09 NOTE — Telephone Encounter (Signed)
We'll have to address next week.

## 2018-06-09 NOTE — Telephone Encounter (Signed)
Patient has been waiting to hear from our office on if he needs to pick RX for pain medication or is it being sent online. Patient's ph# [phone].

## 2018-06-09 NOTE — Telephone Encounter (Signed)
I called pharmacy & he filled for qty of 120 05/10/2018. He would be able to fill today, but he has no prescription on hold. Can you send or does patient need to come in for visit? You are pretty booked out on your schedule?

## 2018-06-15 ENCOUNTER — Ambulatory Visit (INDEPENDENT_AMBULATORY_CARE_PROVIDER_SITE_OTHER): Payer: Medicare Other | Admitting: Endocrinology

## 2018-06-15 ENCOUNTER — Encounter: Payer: Self-pay | Admitting: Endocrinology

## 2018-06-15 VITALS — BP 132/88 | HR 76 | Ht 73.0 in | Wt 215.0 lb

## 2018-06-15 DIAGNOSIS — E119 Type 2 diabetes mellitus without complications: Secondary | ICD-10-CM | POA: Diagnosis not present

## 2018-06-15 DIAGNOSIS — M545 Low back pain, unspecified: Secondary | ICD-10-CM

## 2018-06-15 LAB — POCT GLYCOSYLATED HEMOGLOBIN (HGB A1C): Hemoglobin A1C: 6.5 % — AB (ref 4.0–5.6)

## 2018-06-15 MED ORDER — REPAGLINIDE 0.5 MG PO TABS: 0.5000 mg | ORAL_TABLET | Freq: Three times a day (TID) | ORAL | 3 refills | Status: DC

## 2018-06-15 NOTE — Patient Instructions (Addendum)
Please see a PMR specialist.  you will receive a phone call, about a day and time for an appointment.   I have sent a prescription to your pharmacy, to reduce the repaglinide.  Please continue the same other medications for diabetes.   Please come back for a follow-up appointment in 2-3 weeks.

## 2018-06-15 NOTE — Progress Notes (Signed)
Subjective:    Patient ID: Larry Robertson, male    DOB: October 24, 1941, 77 y.o.   MRN: [id number]  HPI Pt returns for f/u of diabetes mellitus:  DM type: 2 Dx'ed: 2010.  Complications: none.  Therapy: 3 oral meds.  DKA: never.  Severe hypoglycemia: never.  Pancreatitis: never.  Other: edema and nocturia (he no longer takes d-DAVP), limit oral rx options; he has never taken insulin.   Interval history: he does not check cbg's.  Back pain persists.  Past Medical History:  Diagnosis Date  . Arthritis   . Blood transfusion without reported diagnosis   . Colon polyps 05/19/2012  . 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 DIABETES MELLITUS, TYPE II 08/25/2007  . Diastolic dysfunction    on echo  . Diverticulosis   . ERECTILE DYSFUNCTION 08/25/2007  . 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 Heart murmur    but was not stated by DR Hilty  . Hepatitis    took treatment  . HEPATITIS C 08/25/2007  . HYPERTENSION 08/25/2007  . 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 HYPOGONADISM 09/27/2009    Past Surgical History:  Procedure Laterality Date  . ABDOMINAL HERNIA REPAIR  2000  . ANTERIOR CERVICAL DECOMP/DISCECTOMY FUSION Right 03/04/2018   Procedure: Cervical Three-Four, Cervical Six-Seven Right Side approach, Anterior cervical decompression/discectomy/fusion;  Surgeon: Erline Levine, MD;  Location: [place];  Service: Neurosurgery;  Laterality: Right;  right side anterior approach  . CHOLECYSTECTOMY  1999  . COLONOSCOPY    . LUMBAR FUSION  2005  . TONSILLECTOMY AND ADENOIDECTOMY    . UPPER GASTROINTESTINAL ENDOSCOPY      Social History   Socioeconomic History  . Marital status: Single    Spouse name: Not on file  . Number of children: 7  . Years of education: Not on file  . Highest education level: Not on file  Occupational History  . 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 Financial resource strain: Not on file  . Food  insecurity:    Worry: Not on file    Inability: Not on file  . Transportation needs:    Medical: Not on file    Non-medical: Not on file  Tobacco Use  . Smoking status: Never Smoker  . Smokeless tobacco: Never Used  Substance and Sexual Activity  . Alcohol use: Yes    Comment: 3-4 beers per week; no alcohol per pt as of 02-09-17 visit  . Drug use: No  . Sexual activity: Not on file  Lifestyle  . Physical activity:    Days per week: Not on file    Minutes per session: Not on file  . Stress: Not on file  Relationships  . Social connections:    Talks on phone: Not on file    Gets together: Not on file    Attends religious service: Not on file    Active member of club or organization: Not on file    Attends meetings of clubs or organizations: Not on file    Relationship status: Not on file  . Intimate partner violence:    Fear of current or ex partner: Not on file    Emotionally abused: Not on file    Physically abused: Not on file    Forced sexual activity: Not on file  Other Topics Concern  . Not on file  Social History Narrative   Divorced    Current Outpatient Medications on File  Prior to Visit  Medication Sig Dispense Refill  . allopurinol (ZYLOPRIM) 300 MG tablet Take 300 mg by mouth daily as needed (for gout flares ups.).     Marland Kitchen amLODipine (NORVASC) 10 MG tablet Take 1 tablet (10 mg total) by mouth daily. 90 tablet 1  . atorvastatin (LIPITOR) 20 MG tablet Take 1 tablet (20 mg total) at bedtime by mouth. 90 tablet 3  . colchicine 0.6 MG tablet 1 tablet every hour as needed for gout, not to exceed 6/day (Patient taking differently: Take 0.6 mg by mouth every hour as needed (for gout flare ups *do not exceed 6/day*). 1 tablet every hour as needed for gout, not to exceed 6/day) 6 tablet 4  . DULoxetine (CYMBALTA) 60 MG capsule Take 1 capsule (60 mg total) by mouth daily. 90 capsule 2  . losartan (COZAAR) 100 MG tablet TAKE 1 TABLET (100 MG TOTAL) BY MOUTH DAILY. 90 tablet 3    . metFORMIN (GLUCOPHAGE) 1000 MG tablet TAKE 1 TABLET BY MOUTH 2 TIMES DAILY WITH A MEAL. 180 tablet 1  . methocarbamol (ROBAXIN) 500 MG tablet Take 1 tablet (500 mg total) by mouth every 6 (six) hours as needed for muscle spasms. 60 tablet 1  . oxyCODONE-acetaminophen (PERCOCET) 10-325 MG tablet Take 1 tablet by mouth every 4 (four) hours as needed for pain. Please make available 04/23/18 60 tablet 0  . rosuvastatin (CRESTOR) 5 MG tablet Take 1 tablet (5 mg total) daily by mouth. 30 tablet 11  . sitaGLIPtin (JANUVIA) 100 MG tablet Take 1 tablet (100 mg total) by mouth daily. 90 tablet 3  . traZODone (DESYREL) 100 MG tablet Take 1 tablet (100 mg total) by mouth at bedtime. 90 tablet 1   No current facility-administered medications on file prior to visit.     Allergies  Allergen Reactions  . Gabapentin Rash and Other (See Comments)    Diplopia (double vision)    Family History  Problem Relation Age of Onset  . Diabetes Maternal Grandmother   . Cancer Neg Hx   . Colon cancer Neg Hx   . Esophageal cancer Neg Hx   . Rectal cancer Neg Hx   . Stomach cancer Neg Hx     BP 132/88   Pulse 76   Ht 6\' 1"  (1.854 m)   Wt 215 lb (97.5 kg)   SpO2 96%   BMI 28.37 kg/m    Review of Systems He denies hypoglycemia    Objective:   Physical Exam VITAL SIGNS:  See vs page GENERAL: no distress Pulses: foot pulses are intact bilaterally.   MSK: no deformity of the feet or ankles.  CV: no edema of the legs or ankles Skin:  no ulcer on the feet or ankles.  normal color and temp on the feet and ankles.   Neuro: sensation is intact to touch on the feet and ankles, but decreased from normal.   A1c=6.5%     Assessment & Plan:  Type 2 DM: overcontrolled, given pt's age.  Back pain: persistent HTN: well-controlled.  Please continue the same medication.  Patient Instructions  Please see a PMR specialist.  you will receive a phone call, about a day and time for an appointment.   I have sent  a prescription to your pharmacy, to reduce the repaglinide.  Please continue the same other medications for diabetes.   Please come back for a follow-up appointment in 2-3 weeks.

## 2018-07-01 ENCOUNTER — Encounter: Payer: Self-pay | Admitting: Endocrinology

## 2018-07-01 ENCOUNTER — Ambulatory Visit (INDEPENDENT_AMBULATORY_CARE_PROVIDER_SITE_OTHER): Payer: Medicare Other | Admitting: Endocrinology

## 2018-07-01 VITALS — BP 144/78 | HR 83 | Temp 98.8°F | Ht 73.0 in | Wt 210.0 lb

## 2018-07-01 DIAGNOSIS — M545 Low back pain, unspecified: Secondary | ICD-10-CM

## 2018-07-01 MED ORDER — OXYCODONE-ACETAMINOPHEN 10-325 MG PO TABS: 1.0000 | ORAL_TABLET | ORAL | 0 refills | Status: DC | PRN

## 2018-07-01 NOTE — Progress Notes (Signed)
Subjective:    Patient ID: Larry Robertson, male    DOB: Jul 28, 1941, 77 y.o.   MRN: [id number]  HPI Pt returns for chronic opioid rx:  Indication for chronic opioid: he has many years of severe pain at the lower back, and assoc pain at the left shoulder and both legs.   Medication: percocet 10/325 # pills per month: 60 Last annual UDS date: 11/23/17. Paincontract signed (once): done today.   narcotic database: is not available, due to password problem.   Non-opiod rx: lower back surg soon. Robaxin did not help; he has had L-spine injections and C-spine surgery (2019 and 2004); he did not tolerate gabapentin (rash); he takes duloxitene now.    Pain Inventory (1-10 worse): Average Pain: 7 Pain Right Now: 7 Pain is worse with: nothing Relief from Meds: good.    In the last 24 hours, has pain interfered with the following (1-10 greatest interference)?  General activity: 8 Relation with others: 2 Enjoyment of life: 10 What TIME of day is your pain at its worst? any time     Sleep (in general): fair  Mobility/Function: Assistance device: none How many minutes can you walk? 20 Ability to climb steps? yes, with difficulty Do you drive? yes Disabled (date): never Past Medical History:  Diagnosis Date  . Arthritis   . Blood transfusion without reported diagnosis   . Colon polyps 05/19/2012  . 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 DIABETES MELLITUS, TYPE II 08/25/2007  . Diastolic dysfunction    on echo  . Diverticulosis   . ERECTILE DYSFUNCTION 08/25/2007  . 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 Heart murmur    but was not stated by DR Hilty  . Hepatitis    took treatment  . HEPATITIS C 08/25/2007  . HYPERTENSION 08/25/2007  . 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 HYPOGONADISM 09/27/2009    Past Surgical History:  Procedure Laterality Date  . ABDOMINAL HERNIA REPAIR  2000  . ANTERIOR CERVICAL DECOMP/DISCECTOMY FUSION Right 03/04/2018   Procedure:  Cervical Three-Four, Cervical Six-Seven Right Side approach, Anterior cervical decompression/discectomy/fusion;  Surgeon: Erline Levine, MD;  Location: [facility];  Service: Neurosurgery;  Laterality: Right;  right side anterior approach  . CHOLECYSTECTOMY  1999  . COLONOSCOPY    . LUMBAR FUSION  2005  . TONSILLECTOMY AND ADENOIDECTOMY    . UPPER GASTROINTESTINAL ENDOSCOPY      Social History   Socioeconomic History  . Marital status: Single    Spouse name: Not on file  . Number of children: 7  . Years of education: Not on file  . Highest education level: Not on file  Occupational History  . 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 Financial resource strain: Not on file  . Food insecurity:    Worry: Not on file    Inability: Not on file  . Transportation needs:    Medical: Not on file    Non-medical: Not on file  Tobacco Use  . Smoking status: Never Smoker  . Smokeless tobacco: Never Used  Substance and Sexual Activity  . Alcohol use: Yes    Comment: 3-4 beers per week; no alcohol per pt as of 02-09-17 visit  . Drug use: No  . Sexual activity: Not on file  Lifestyle  . Physical activity:    Days per week: Not on file    Minutes per session: Not on file  . Stress: Not on file  Relationships  . Social connections:    Talks on phone: Not on file    Gets together: Not on file    Attends religious service: Not on file    Active member of club or organization: Not on file    Attends meetings of clubs or organizations: Not on file    Relationship status: Not on file  . Intimate partner violence:    Fear of current or ex partner: Not on file    Emotionally abused: Not on file    Physically abused: Not on file    Forced sexual activity: Not on file  Other Topics Concern  . Not on file  Social History Narrative   Divorced    Current Outpatient Medications on File Prior to Visit  Medication Sig Dispense Refill  . allopurinol (ZYLOPRIM) 300  MG tablet Take 300 mg by mouth daily as needed (for gout flares ups.).     Marland Kitchen amLODipine (NORVASC) 10 MG tablet Take 1 tablet (10 mg total) by mouth daily. 90 tablet 1  . atorvastatin (LIPITOR) 20 MG tablet Take 1 tablet (20 mg total) at bedtime by mouth. 90 tablet 3  . colchicine 0.6 MG tablet 1 tablet every hour as needed for gout, not to exceed 6/day (Patient taking differently: Take 0.6 mg by mouth every hour as needed (for gout flare ups *do not exceed 6/day*). 1 tablet every hour as needed for gout, not to exceed 6/day) 6 tablet 4  . DULoxetine (CYMBALTA) 60 MG capsule Take 1 capsule (60 mg total) by mouth daily. 90 capsule 2  . losartan (COZAAR) 100 MG tablet TAKE 1 TABLET (100 MG TOTAL) BY MOUTH DAILY. 90 tablet 3  . metFORMIN (GLUCOPHAGE) 1000 MG tablet TAKE 1 TABLET BY MOUTH 2 TIMES DAILY WITH A MEAL. 180 tablet 1  . methocarbamol (ROBAXIN) 500 MG tablet Take 1 tablet (500 mg total) by mouth every 6 (six) hours as needed for muscle spasms. 60 tablet 1  . repaglinide (PRANDIN) 0.5 MG tablet Take 1 tablet (0.5 mg total) by mouth 3 (three) times daily before meals. 270 tablet 3  . rosuvastatin (CRESTOR) 5 MG tablet Take 1 tablet (5 mg total) daily by mouth. 30 tablet 11  . sitaGLIPtin (JANUVIA) 100 MG tablet Take 1 tablet (100 mg total) by mouth daily. 90 tablet 3  . traZODone (DESYREL) 100 MG tablet Take 1 tablet (100 mg total) by mouth at bedtime. 90 tablet 1   No current facility-administered medications on file prior to visit.     Allergies  Allergen Reactions  . Gabapentin Rash and Other (See Comments)    Diplopia (double vision)    Family History  Problem Relation Age of Onset  . Diabetes Maternal Grandmother   . Cancer Neg Hx   . Colon cancer Neg Hx   . Esophageal cancer Neg Hx   . Rectal cancer Neg Hx   . Stomach cancer Neg Hx     BP (!) 144/78 (BP Location: Right Arm, Patient Position: Sitting, Cuff Size: Normal)   Pulse 83   Temp 98.8 F (37.1 C) (Oral)   Ht 6\' 1"   (1.854 m)   Wt 210 lb (95.3 kg)   SpO2 97%   BMI 27.71 kg/m    Review of Systems denies falls and numbness     Objective:   Physical Exam VITAL SIGNS:  See vs page GENERAL: no distress Spine: nontender Gait: slow but steady      Assessment & Plan:  Chronic  pain syndrome: persistent Insomnia: stable: he declines to increase trazodone or cymbalta.  HTN: recheck next time  Patient Instructions  I have refilled the pain medication x 3 months.   Please come back for a follow-up appointment in 2 months.

## 2018-07-01 NOTE — Patient Instructions (Signed)
I have refilled the pain medication x 3 months.   Please come back for a follow-up appointment in 2 months.

## 2018-07-15 ENCOUNTER — Telehealth: Payer: Self-pay | Admitting: Endocrinology

## 2018-07-15 DIAGNOSIS — M545 Low back pain, unspecified: Secondary | ICD-10-CM

## 2018-07-15 NOTE — Telephone Encounter (Signed)
Ok, I put in referral

## 2018-07-15 NOTE — Telephone Encounter (Signed)
Patient stated that Dr Loanne Drilling was going to be referring him to a pain management office and he has not heard from anyone. He would like to know if he can have the name and number of the office her was referring him to so he can call to schedule  Please advise

## 2018-07-15 NOTE — Telephone Encounter (Signed)
Patient needs another referral for pain management. The last one was closed out because he misunderstood & did not mean to refuse the appointment all together. If you look at notes it seems there was a misunderstanding somewhere? Patient out of pain medication & needs help.

## 2018-07-24 ENCOUNTER — Other Ambulatory Visit: Payer: Self-pay | Admitting: Endocrinology

## 2018-07-26 ENCOUNTER — Telehealth: Payer: Self-pay | Admitting: Endocrinology

## 2018-07-26 NOTE — Telephone Encounter (Signed)
Patient stated he is needing the number to the pain mgnt center that dr Loanne Drilling referred him to so he can call and check the status  Please advise

## 2018-07-27 ENCOUNTER — Encounter: Payer: Self-pay | Admitting: Physical Medicine & Rehabilitation

## 2018-07-27 NOTE — Telephone Encounter (Signed)
I called & informed patient that referral was placed on 8/15. In notes it stated allow 4-6 weeks for review as well as scheduling. Patient seemed to be ok with this & said he understood.

## 2018-08-16 DIAGNOSIS — M5412 Radiculopathy, cervical region: Secondary | ICD-10-CM | POA: Diagnosis not present

## 2018-08-16 DIAGNOSIS — R030 Elevated blood-pressure reading, without diagnosis of hypertension: Secondary | ICD-10-CM | POA: Diagnosis not present

## 2018-08-16 DIAGNOSIS — M542 Cervicalgia: Secondary | ICD-10-CM | POA: Diagnosis not present

## 2018-08-16 DIAGNOSIS — M545 Low back pain: Secondary | ICD-10-CM | POA: Diagnosis not present

## 2018-08-16 DIAGNOSIS — M5020 Other cervical disc displacement, unspecified cervical region: Secondary | ICD-10-CM | POA: Diagnosis not present

## 2018-08-19 ENCOUNTER — Telehealth: Payer: Self-pay | Admitting: Endocrinology

## 2018-08-19 ENCOUNTER — Ambulatory Visit: Payer: Medicare Other | Admitting: Physical Medicine & Rehabilitation

## 2018-08-19 MED ORDER — GLUCOSE BLOOD VI STRP: 1.0000 | ORAL_STRIP | Freq: Every day | 12 refills | Status: DC

## 2018-08-19 MED ORDER — ONETOUCH VERIO IQ SYSTEM W/DEVICE KIT: 1.0000 | PACK | Freq: Once | 0 refills | Status: AC

## 2018-08-19 NOTE — Telephone Encounter (Signed)
Please place order or inform me of what you would like to be sent

## 2018-08-19 NOTE — Telephone Encounter (Signed)
Pt aware.

## 2018-08-19 NOTE — Telephone Encounter (Signed)
Ok, I have sent a prescription to your pharmacy 

## 2018-08-19 NOTE — Telephone Encounter (Signed)
Patient requests a RX for a meter and supplies to test his blood sugars with. Please send RX to [facility] [address].

## 2018-08-19 NOTE — Telephone Encounter (Signed)
[employer] ph#  [phone] requests patient gets a RX for a diabetic meter and supplies-they cover Accucheck Guide, Accuck Nano, Accucheck  Aviva Plus  AND OneTouch Verio Flex brands (script has to be one of the meters w/accompanying supplies listed herein) sent to [facility] on [address]

## 2018-08-20 ENCOUNTER — Other Ambulatory Visit: Payer: Self-pay | Admitting: Endocrinology

## 2018-08-20 MED ORDER — ACCU-CHEK AVIVA DEVI: 0 refills | Status: AC

## 2018-08-20 MED ORDER — GLUCOSE BLOOD VI STRP: 1.0000 | ORAL_STRIP | Freq: Every day | 3 refills | Status: DC

## 2018-08-20 NOTE — Telephone Encounter (Signed)
Please change to one of the approved meters and supples

## 2018-08-20 NOTE — Telephone Encounter (Signed)
Ok, I have sent a prescription to your pharmacy 

## 2018-08-23 ENCOUNTER — Encounter: Payer: Self-pay | Admitting: Physical Medicine & Rehabilitation

## 2018-08-23 ENCOUNTER — Encounter: Payer: Medicare Other | Attending: Physical Medicine & Rehabilitation

## 2018-08-23 ENCOUNTER — Ambulatory Visit (HOSPITAL_BASED_OUTPATIENT_CLINIC_OR_DEPARTMENT_OTHER): Payer: Medicare Other | Admitting: Physical Medicine & Rehabilitation

## 2018-08-23 VITALS — BP 135/84 | HR 82 | Ht 73.0 in | Wt 212.6 lb

## 2018-08-23 DIAGNOSIS — E119 Type 2 diabetes mellitus without complications: Secondary | ICD-10-CM | POA: Insufficient documentation

## 2018-08-23 DIAGNOSIS — M24112 Other articular cartilage disorders, left shoulder: Secondary | ICD-10-CM | POA: Insufficient documentation

## 2018-08-23 DIAGNOSIS — M961 Postlaminectomy syndrome, not elsewhere classified: Secondary | ICD-10-CM | POA: Diagnosis not present

## 2018-08-23 DIAGNOSIS — M25519 Pain in unspecified shoulder: Secondary | ICD-10-CM | POA: Diagnosis not present

## 2018-08-23 DIAGNOSIS — M545 Low back pain, unspecified: Secondary | ICD-10-CM

## 2018-08-23 DIAGNOSIS — G8929 Other chronic pain: Secondary | ICD-10-CM

## 2018-08-23 DIAGNOSIS — I10 Essential (primary) hypertension: Secondary | ICD-10-CM | POA: Diagnosis not present

## 2018-08-23 DIAGNOSIS — Z9889 Other specified postprocedural states: Secondary | ICD-10-CM | POA: Insufficient documentation

## 2018-08-23 DIAGNOSIS — M24512 Contracture, left shoulder: Secondary | ICD-10-CM | POA: Insufficient documentation

## 2018-08-23 DIAGNOSIS — G894 Chronic pain syndrome: Secondary | ICD-10-CM | POA: Insufficient documentation

## 2018-08-23 MED ORDER — PREGABALIN 25 MG PO CAPS: 25.0000 mg | ORAL_CAPSULE | Freq: Two times a day (BID) | ORAL | 1 refills | Status: DC

## 2018-08-23 NOTE — Patient Instructions (Signed)
Need to check urine screen prior to calling in any other pain meds Back Exercises If you have pain in your back, do these exercises 2-3 times each day or as told by your doctor. When the pain goes away, do the exercises once each day, but repeat the steps more times for each exercise (do more repetitions). If you do not have pain in your back, do these exercises once each day or as told by your doctor. Exercises Single Knee to Chest  Do these steps 3-5 times in a row for each leg: 1. Lie on your back on a firm bed or the floor with your legs stretched out. 2. Bring one knee to your chest. 3. Hold your knee to your chest by grabbing your knee or thigh. 4. Pull on your knee until you feel a gentle stretch in your lower back. 5. Keep doing the stretch for 10-30 seconds. 6. Slowly let go of your leg and straighten it.  Pelvic Tilt  Do these steps 5-10 times in a row: 1. Lie on your back on a firm bed or the floor with your legs stretched out. 2. Bend your knees so they point up to the ceiling. Your feet should be flat on the floor. 3. Tighten your lower belly (abdomen) muscles to press your lower back against the floor. This will make your tailbone point up to the ceiling instead of pointing down to your feet or the floor. 4. Stay in this position for 5-10 seconds while you gently tighten your muscles and breathe evenly.  Cat-Cow  Do these steps until your lower back bends more easily: 1. Get on your hands and knees on a firm surface. Keep your hands under your shoulders, and keep your knees under your hips. You may put padding under your knees. 2. Let your head hang down, and make your tailbone point down to the floor so your lower back is round like the back of a cat. 3. Stay in this position for 5 seconds. 4. Slowly lift your head and make your tailbone point up to the ceiling so your back hangs low (sags) like the back of a cow. 5. Stay in this position for 5 seconds.  Press-Ups  Do  these steps 5-10 times in a row: 1. Lie on your belly (face-down) on the floor. 2. Place your hands near your head, about shoulder-width apart. 3. While you keep your back relaxed and keep your hips on the floor, slowly straighten your arms to raise the top half of your body and lift your shoulders. Do not use your back muscles. To make yourself more comfortable, you may change where you place your hands. 4. Stay in this position for 5 seconds. 5. Slowly return to lying flat on the floor.  Bridges  Do these steps 10 times in a row: 1. Lie on your back on a firm surface. 2. Bend your knees so they point up to the ceiling. Your feet should be flat on the floor. 3. Tighten your butt muscles and lift your butt off of the floor until your waist is almost as high as your knees. If you do not feel the muscles working in your butt and the back of your thighs, slide your feet 1-2 inches farther away from your butt. 4. Stay in this position for 3-5 seconds. 5. Slowly lower your butt to the floor, and let your butt muscles relax.  If this exercise is too easy, try doing it with your arms  crossed over your chest. Belly Crunches  Do these steps 5-10 times in a row: 1. Lie on your back on a firm bed or the floor with your legs stretched out. 2. Bend your knees so they point up to the ceiling. Your feet should be flat on the floor. 3. Cross your arms over your chest. 4. Tip your chin a little bit toward your chest but do not bend your neck. 5. Tighten your belly muscles and slowly raise your chest just enough to lift your shoulder blades a tiny bit off of the floor. 6. Slowly lower your chest and your head to the floor.  Back Lifts Do these steps 5-10 times in a row: 1. Lie on your belly (face-down) with your arms at your sides, and rest your forehead on the floor. 2. Tighten the muscles in your legs and your butt. 3. Slowly lift your chest off of the floor while you keep your hips on the floor. Keep  the back of your head in line with the curve in your back. Look at the floor while you do this. 4. Stay in this position for 3-5 seconds. 5. Slowly lower your chest and your face to the floor.  Contact a doctor if:  Your back pain gets a lot worse when you do an exercise.  Your back pain does not lessen 2 hours after you exercise. If you have any of these problems, stop doing the exercises. Do not do them again unless your doctor says it is okay. Get help right away if:  You have sudden, very bad back pain. If this happens, stop doing the exercises. Do not do them again unless your doctor says it is okay. This information is not intended to replace advice given to you by your health care provider. Make sure you discuss any questions you have with your health care provider. Document Released: 12/20/2010 Document Revised: 04/24/2016 Document Reviewed: 01/11/2015 Elsevier Interactive Patient Education  [employer].

## 2018-08-23 NOTE — Progress Notes (Signed)
Subjective:    Patient ID: Larry Robertson, male    DOB: 1941-03-17, 77 y.o.   MRN: [id number]  HPI 77 year old male with history of multiple pain complaints that have been going on for many years.  Pains include upper back lower back leg and left shoulder. Patient does not have any one particular pain that is more intense than the others. Left shoulder has been hurting the longest.  The patient underwent cervical fusion March 04, 2018.  Cervical fusion was ACDF at C3-C4 level.  Patient did not feel like this surgery was helpful in alleviating his left shoulder pain.  Also did not have any relief of neck pain.  He has been told that he had nerve damage affecting the left shoulder. Shoulder bothers him constantly.  Cannot elevate arm overhead  Currently on oxycodone 10mg  BID for pain This has been the average over the last 39mo, prescribed by Dr Vertell Limber or Loanne Drilling  Dr Loanne Drilling no longer following pt as PCP, just for endocrine issues  Has had spine but not shoulder injections  Shoulder MRI from 2005 reviewed, evidence of rotator cuff degeneration as well as glenohumeral and acromioclavicular arthritis.  There is also labral cuff degeneration.  The patient has not undergone any surgery for the left shoulder.  Hx of Lumbar surgery 2005, x-rays of the lumbar spine reviewed from 2018 which did not show any metallic hardware.  Pain Inventory Average Pain 7 Pain Right Now 7 My pain is aching  In the last 24 hours, has pain interfered with the following? General activity 4 Relation with others 5 Enjoyment of life 7 What TIME of day is your pain at its worst? na Sleep (in general) Fair  Pain is worse with: unsure Pain improves with: medication Relief from Meds: 10  Mobility walk without assistance ability to climb steps?  yes do you drive?  yes  Function not employed: date last employed 2014  Neuro/Psych trouble walking  Prior Studies Any changes since last visit?   no  Physicians involved in your care Any changes since last visit?  no   Family History  Problem Relation Age of Onset  . Diabetes Maternal Grandmother   . Cancer Neg Hx   . Colon cancer Neg Hx   . Esophageal cancer Neg Hx   . Rectal cancer Neg Hx   . Stomach cancer Neg Hx    Social History   Socioeconomic History  . Marital status: Single    Spouse name: Not on file  . Number of children: 7  . Years of education: Not on file  . Highest education level: Not on file  Occupational History  . 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 Financial resource strain: Not on file  . Food insecurity:    Worry: Not on file    Inability: Not on file  . Transportation needs:    Medical: Not on file    Non-medical: Not on file  Tobacco Use  . Smoking status: Never Smoker  . Smokeless tobacco: Never Used  Substance and Sexual Activity  . Alcohol use: Yes    Comment: 3-4 beers per week; no alcohol per pt as of 02-09-17 visit  . Drug use: No  . Sexual activity: Not on file  Lifestyle  . Physical activity:    Days per week: Not on file    Minutes per session: Not on file  . Stress: Not on file  Relationships  .  Social connections:    Talks on phone: Not on file    Gets together: Not on file    Attends religious service: Not on file    Active member of club or organization: Not on file    Attends meetings of clubs or organizations: Not on file    Relationship status: Not on file  Other Topics Concern  . Not on file  Social History Narrative   Divorced   Past Surgical History:  Procedure Laterality Date  . ABDOMINAL HERNIA REPAIR  2000  . ANTERIOR CERVICAL DECOMP/DISCECTOMY FUSION Right 03/04/2018   Procedure: Cervical Three-Four, Cervical Six-Seven Right Side approach, Anterior cervical decompression/discectomy/fusion;  Surgeon: Erline Levine, MD;  Location: [facility];  Service: Neurosurgery;  Laterality: Right;  right side anterior approach  .  CHOLECYSTECTOMY  1999  . COLONOSCOPY    . LUMBAR FUSION  2005  . TONSILLECTOMY AND ADENOIDECTOMY    . UPPER GASTROINTESTINAL ENDOSCOPY     Past Medical History:  Diagnosis Date  . Arthritis   . Blood transfusion without reported diagnosis   . Colon polyps 05/19/2012  . 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 DIABETES MELLITUS, TYPE II 08/25/2007  . Diastolic dysfunction    on echo  . Diverticulosis   . ERECTILE DYSFUNCTION 08/25/2007  . 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 Heart murmur    but was not stated by DR Hilty  . Hepatitis    took treatment  . HEPATITIS C 08/25/2007  . HYPERTENSION 08/25/2007  . 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 HYPOGONADISM 09/27/2009   Ht 6\' 1"  (1.854 m)   Wt 212 lb 9.6 oz (96.4 kg)   BMI 28.05 kg/m   Opioid Risk Score:   Fall Risk Score:  `1  Depression screen PHQ 2/9  No flowsheet data found.   Review of Systems  Constitutional: Negative.   HENT: Negative.   Eyes: Negative.   Respiratory: Negative.   Cardiovascular: Negative.   Gastrointestinal: Negative.   Endocrine:       High blood sugar   Genitourinary: Negative.   Musculoskeletal: Positive for arthralgias, back pain, gait problem, myalgias and neck pain.  Skin: Negative.   Allergic/Immunologic: Negative.   Hematological: Negative.   Psychiatric/Behavioral: Negative.   All other systems reviewed and are negative.      Objective:   Physical Exam  Constitutional: He is oriented to person, place, and time. He appears well-developed and well-nourished. No distress.  HENT:  Head: Normocephalic and atraumatic.  Eyes: Pupils are equal, round, and reactive to light. EOM are normal.  Neck:  Reduced cervical flexion and extension, lateral bending reduced on R>L  Musculoskeletal:  Reduced lumbar range of motion 50% flexion extension lateral bending and rotation.  No directional component pain. Scoliosis mild. Left shoulder with impingement signs  external rotation is intact no pain with adduction of the shoulder.  Neurological: He is alert and oriented to person, place, and time. He has normal strength. No sensory deficit.  5/5 strength in bilateral deltoid, bicep, tricep, grip, hip flexor, knee extensor, ankle dorsiflexor Negative straight leg raise Sensation intact to pinprick bilateral C5 C6-C7-C8 L2-L3-L4 L5-S1 dermatomal distribution. Lumbar spine with levoscoliosis  Skin: He is not diaphoretic.  Nursing note and vitals reviewed.         Assessment & Plan:  1.  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

## 2018-08-28 LAB — TOXASSURE SELECT,+ANTIDEPR,UR

## 2018-09-01 ENCOUNTER — Ambulatory Visit (INDEPENDENT_AMBULATORY_CARE_PROVIDER_SITE_OTHER): Payer: Medicare Other | Admitting: Endocrinology

## 2018-09-01 ENCOUNTER — Encounter: Payer: Self-pay | Admitting: Endocrinology

## 2018-09-01 VITALS — BP 130/80 | HR 77 | Ht 73.0 in | Wt 218.6 lb

## 2018-09-01 DIAGNOSIS — M109 Gout, unspecified: Secondary | ICD-10-CM | POA: Diagnosis not present

## 2018-09-01 DIAGNOSIS — Z23 Encounter for immunization: Secondary | ICD-10-CM | POA: Diagnosis not present

## 2018-09-01 DIAGNOSIS — E119 Type 2 diabetes mellitus without complications: Secondary | ICD-10-CM | POA: Diagnosis not present

## 2018-09-01 DIAGNOSIS — I4891 Unspecified atrial fibrillation: Secondary | ICD-10-CM

## 2018-09-01 DIAGNOSIS — Z125 Encounter for screening for malignant neoplasm of prostate: Secondary | ICD-10-CM | POA: Diagnosis not present

## 2018-09-01 DIAGNOSIS — Z0000 Encounter for general adult medical examination without abnormal findings: Secondary | ICD-10-CM | POA: Diagnosis not present

## 2018-09-01 LAB — BASIC METABOLIC PANEL
BUN: 15 mg/dL (ref 6–23)
CO2: 28 mEq/L (ref 19–32)
Calcium: 9.7 mg/dL (ref 8.4–10.5)
Chloride: 101 mEq/L (ref 96–112)
Creatinine, Ser: 0.93 mg/dL (ref 0.40–1.50)
GFR: 83.43 mL/min (ref >60.00)
Glucose, Bld: 186 mg/dL — ABNORMAL HIGH (ref 70–99)
Potassium: 3.7 mEq/L (ref 3.5–5.1)
Sodium: 139 mEq/L (ref 135–145)

## 2018-09-01 LAB — URINALYSIS, ROUTINE W REFLEX MICROSCOPIC
Bilirubin Urine: NEGATIVE
Hgb urine dipstick: NEGATIVE
Ketones, ur: NEGATIVE
Leukocytes, UA: NEGATIVE
Nitrite: NEGATIVE
RBC / HPF: NONE SEEN (ref 0–?)
Specific Gravity, Urine: <=1.005 — AB (ref 1.000–1.030)
Total Protein, Urine: NEGATIVE
Urine Glucose: NEGATIVE
Urobilinogen, UA: 0.2 (ref 0.0–1.0)
pH: 6 (ref 5.0–8.0)

## 2018-09-01 LAB — CBC WITH DIFFERENTIAL/PLATELET
Basophils Absolute: 0.1 10*3/uL (ref 0.0–0.1)
Basophils Relative: 0.7 % (ref 0.0–3.0)
Eosinophils Absolute: 0.1 10*3/uL (ref 0.0–0.7)
Eosinophils Relative: 1.6 % (ref 0.0–5.0)
HCT: 41.6 % (ref 39.0–52.0)
Hemoglobin: 14.3 g/dL (ref 13.0–17.0)
Lymphocytes Relative: 38.5 % (ref 12.0–46.0)
Lymphs Abs: 2.7 10*3/uL (ref 0.7–4.0)
MCHC: 34.3 g/dL (ref 30.0–36.0)
MCV: 88.4 fl (ref 78.0–100.0)
Monocytes Absolute: 0.5 10*3/uL (ref 0.1–1.0)
Monocytes Relative: 7.4 % (ref 3.0–12.0)
Neutro Abs: 3.6 10*3/uL (ref 1.4–7.7)
Neutrophils Relative %: 51.8 % (ref 43.0–77.0)
Platelets: 194 10*3/uL (ref 150.0–400.0)
RBC: 4.71 Mil/uL (ref 4.22–5.81)
RDW: 12.8 % (ref 11.5–15.5)
WBC: 6.9 10*3/uL (ref 4.0–10.5)

## 2018-09-01 LAB — PSA: PSA: 2.37 ng/mL (ref 0.10–4.00)

## 2018-09-01 LAB — URIC ACID: Uric Acid, Serum: 5.4 mg/dL (ref 4.0–7.8)

## 2018-09-01 LAB — HEPATIC FUNCTION PANEL
ALT: 32 U/L (ref 0–53)
AST: 22 U/L (ref 0–37)
Albumin: 4.6 g/dL (ref 3.5–5.2)
Alkaline Phosphatase: 46 U/L (ref 39–117)
Bilirubin, Direct: 0.2 mg/dL (ref 0.0–0.3)
Total Bilirubin: 1 mg/dL (ref 0.2–1.2)
Total Protein: 7.1 g/dL (ref 6.0–8.3)

## 2018-09-01 LAB — POCT GLYCOSYLATED HEMOGLOBIN (HGB A1C): Hemoglobin A1C: 6.6 % — AB (ref 4.0–5.6)

## 2018-09-01 LAB — MICROALBUMIN / CREATININE URINE RATIO
Creatinine,U: 42.7 mg/dL
Microalb Creat Ratio: 4.5 mg/g (ref 0.0–30.0)
Microalb, Ur: 1.9 mg/dL (ref 0.0–1.9)

## 2018-09-01 LAB — TSH: TSH: 1.83 u[IU]/mL (ref 0.35–4.50)

## 2018-09-01 MED ORDER — COLCHICINE 0.6 MG PO TABS: ORAL_TABLET | ORAL | 4 refills | Status: AC

## 2018-09-01 NOTE — Progress Notes (Signed)
Subjective:    Patient ID: Larry Robertson, male    DOB: 1941/08/26, 77 y.o.   MRN: [id number]  HPI Pt is here for regular wellness examination, and is feeling pretty well in general, and says chronic med probs are stable, except as noted below Past Medical History:  Diagnosis Date  . Arthritis   . Blood transfusion without reported diagnosis   . Colon polyps 05/19/2012  . 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 DIABETES MELLITUS, TYPE II 08/25/2007  . Diastolic dysfunction    on echo  . Diverticulosis   . ERECTILE DYSFUNCTION 08/25/2007  . 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 Heart murmur    but was not stated by DR Hilty  . Hepatitis    took treatment  . HEPATITIS C 08/25/2007  . HYPERTENSION 08/25/2007  . 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 HYPOGONADISM 09/27/2009    Past Surgical History:  Procedure Laterality Date  . ABDOMINAL HERNIA REPAIR  2000  . ANTERIOR CERVICAL DECOMP/DISCECTOMY FUSION Right 03/04/2018   Procedure: Cervical Three-Four, Cervical Six-Seven Right Side approach, Anterior cervical decompression/discectomy/fusion;  Surgeon: Erline Levine, MD;  Location: [facility];  Service: Neurosurgery;  Laterality: Right;  right side anterior approach  . CHOLECYSTECTOMY  1999  . COLONOSCOPY    . LUMBAR FUSION  2005  . TONSILLECTOMY AND ADENOIDECTOMY    . UPPER GASTROINTESTINAL ENDOSCOPY      Social History   Socioeconomic History  . Marital status: Single    Spouse name: Not on file  . Number of children: 7  . Years of education: Not on file  . Highest education level: Not on file  Occupational History  . 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 Financial resource strain: Not on file  . Food insecurity:    Worry: Not on file    Inability: Not on file  . Transportation needs:    Medical: Not on file    Non-medical: Not on file  Tobacco Use  . Smoking status: Never Smoker  . Smokeless tobacco: Never  Used  Substance and Sexual Activity  . Alcohol use: Yes    Comment: 3-4 beers per week; no alcohol per pt as of 02-09-17 visit  . Drug use: No  . Sexual activity: Not on file  Lifestyle  . Physical activity:    Days per week: Not on file    Minutes per session: Not on file  . Stress: Not on file  Relationships  . Social connections:    Talks on phone: Not on file    Gets together: Not on file    Attends religious service: Not on file    Active member of club or organization: Not on file    Attends meetings of clubs or organizations: Not on file    Relationship status: Not on file  . Intimate partner violence:    Fear of current or ex partner: Not on file    Emotionally abused: Not on file    Physically abused: Not on file    Forced sexual activity: Not on file  Other Topics Concern  . Not on file  Social History Narrative   Divorced    Current Outpatient Medications on File Prior to Visit  Medication Sig Dispense Refill  . allopurinol (ZYLOPRIM) 300 MG tablet Take 300 mg by mouth daily as needed (for gout flares ups.).     Marland Kitchen amLODipine (NORVASC) 10 MG tablet Take 1  tablet (10 mg total) by mouth daily. 90 tablet 1  . atorvastatin (LIPITOR) 20 MG tablet Take 1 tablet (20 mg total) at bedtime by mouth. 90 tablet 3  . Blood Glucose Monitoring Suppl (ACCU-CHEK AVIVA) device Use as instructed 1 each 0  . DULoxetine (CYMBALTA) 60 MG capsule TAKE 1 CAPSULE BY MOUTH EVERY DAY 90 capsule 2  . glucose blood (ACCU-CHEK AVIVA) test strip 1 each by Other route daily. Use as instructed 100 each 3  . JANUVIA 100 MG tablet TAKE 1 TABLET BY MOUTH EVERY DAY 90 tablet 3  . losartan (COZAAR) 100 MG tablet TAKE 1 TABLET (100 MG TOTAL) BY MOUTH DAILY. 90 tablet 3  . metFORMIN (GLUCOPHAGE) 1000 MG tablet TAKE 1 TABLET BY MOUTH 2 TIMES DAILY WITH A MEAL. 180 tablet 1  . oxyCODONE-acetaminophen (PERCOCET) 10-325 MG tablet Take 1 tablet by mouth every 4 (four) hours as needed for pain. 60 tablet 0  .  pregabalin (LYRICA) 25 MG capsule Take 1 capsule (25 mg total) by mouth 2 (two) times daily. 60 capsule 1  . repaglinide (PRANDIN) 0.5 MG tablet Take 1 tablet (0.5 mg total) by mouth 3 (three) times daily before meals. 270 tablet 3  . traZODone (DESYREL) 100 MG tablet Take 1 tablet (100 mg total) by mouth at bedtime. 90 tablet 1   No current facility-administered medications on file prior to visit.     Allergies  Allergen Reactions  . Gabapentin Rash and Other (See Comments)    Diplopia (double vision)    Family History  Problem Relation Age of Onset  . Diabetes Maternal Grandmother   . Cancer Neg Hx   . Colon cancer Neg Hx   . Esophageal cancer Neg Hx   . Rectal cancer Neg Hx   . Stomach cancer Neg Hx     BP 130/80 (BP Location: Left Arm)   Pulse 77   Ht 6\' 1"  (1.854 m)   Wt 218 lb 9.6 oz (99.2 kg)   SpO2 95%   BMI 28.84 kg/m     Review of Systems Denies fever, fatigue, diplopia, earache, chest pain, sob, insomnia, cold intolerance, BRBPR, hematuria, syncope, numbness, allergy sxs, easy bruising, and rash. No change in chronic back pain.    Objective:   Physical Exam VS: see vs page GEN: no distress HEAD: head: no deformity eyes: no periorbital swelling, no proptosis external nose and ears are normal mouth: no lesion seen NECK: supple, thyroid is not enlarged CHEST WALL: no deformity LUNGS: clear to auscultation CV: reg rate and rhythm, no murmur ABD: abdomen is soft, nontender.  no hepatosplenomegaly.  not distended.  Self-reducing ventral hernia MUSCULOSKELETAL: muscle bulk and strength are grossly normal.  no obvious joint swelling.  gait is normal and steady EXTEMITIES: no deformity.  no ulcer on the feet.  feet are of normal color and temp.  Trace bilat leg edema, and bilat vv's.   PULSES: dorsalis pedis intact bilat.  no carotid bruit NEURO:  cn 2-12 grossly intact.   readily moves all 4's.  sensation is intact to touch on the feet SKIN:  Normal texture and  temperature.  No rash or suspicious lesion is visible.   NODES:  None palpable at the neck PSYCH: alert, well-oriented.  Does not appear anxious nor depressed.   Lab Results  Component Value Date   HGBA1C 6.6 (A) 09/01/2018       Assessment & Plan:  Wellness visit today, with problems stable, except as noted.  Subjective:   Patient here for Medicare annual wellness visit and management of other chronic and acute problems.     Risk factors: advanced age    62 of Physicians Providing Medical Care to Patient:  See "snapshot"   Activities of Daily Living: In your present state of health, do you have any difficulty performing the following activities (lives alone)?:  Preparing food and eating?: No  Bathing yourself: No  Getting dressed: No  Using the toilet:No  Moving around from place to place: No  In the past year have you fallen or had a near fall?: No    Home Safety: Has smoke detector and wears seat belts. No firearms. No excess sun exposure.  Opioid Use: addressed at separate visits   Diet and Exercise  Current exercise habits: pt says limited by back pain Dietary issues discussed: pt reports a healthy diet   Depression Screen  Q1: Over the past two weeks, have you felt down, depressed or hopeless? no  Q2: Over the past two weeks, have you felt little interest or pleasure in doing things? no   The following portions of the patient's history were reviewed and updated as appropriate: allergies, current medications, past family history, past medical history, past social history, past surgical history and problem list.   Review of Systems  Denies hearing loss, and visual loss Objective:   Vision:  See [facility] done today Hearing: grossly normal Body mass index:  See vs page Msk: pt easily and quickly performs "get-up-and-go" from a sitting position Cognitive Impairment Assessment: cognition, memory and judgment appear normal.  remembers 3/3 at 5 minutes.  excellent  recall.  can easily read and write a sentence.  alert and oriented x 3.     Assessment:   Medicare wellness utd on preventive parameters    Plan:   During the course of the visit the patient was educated and counseled about appropriate screening and preventive services including:        Fall prevention is advised today  Diabetes screening is UTD Nutrition counseling is offered  advanced directives/end of life addressed today:  see healthcare directives hyperlink  Vaccines are updated as needed  Patient Instructions (the written plan) was given to the patient.   Patient Instructions  good diet and exercise significantly improve the control of your diabetes.  please let me know if you wish to be referred to a dietician.  high blood sugar is very risky to your health.  you should see an eye doctor and dentist every year.  It is very important to get all recommended vaccinations.  Please consider these measures for your health:  minimize alcohol.  Do not use tobacco products.  Have a colonoscopy at least every 10 years from age 89.  Keep firearms safely stored.  Always use seat belts.  have working smoke alarms in your home.  See an eye doctor and dentist regularly.  Never drive under the influence of alcohol or drugs (including prescription drugs).  Those with fair skin should take precautions against the sun, and should carefully examine their skin once per month, for any new or changed moles.  It is critically important to prevent falling down (keep floor areas well-lit, dry, and free of loose objects.  If you have a cane, walker, or wheelchair, you should use it, even for short trips around the house.  Wear flat-soled shoes.  Also, try not to rush).  blood tests are requested for you today.  We'll let you  know about the results.   Please come back for a follow-up appointment in 1 month.

## 2018-09-01 NOTE — Progress Notes (Signed)
we discussed code status.  pt requests full code, but would not want to be started or maintained on artificial life-support measures if there was not a reasonable chance of recovery 

## 2018-09-01 NOTE — Patient Instructions (Addendum)
good diet and exercise significantly improve the control of your diabetes.  please let me know if you wish to be referred to a dietician.  high blood sugar is very risky to your health.  you should see an eye doctor and dentist every year.  It is very important to get all recommended vaccinations.  Please consider these measures for your health:  minimize alcohol.  Do not use tobacco products.  Have a colonoscopy at least every 10 years from age 78.  Keep firearms safely stored.  Always use seat belts.  have working smoke alarms in your home.  See an eye doctor and dentist regularly.  Never drive under the influence of alcohol or drugs (including prescription drugs).  Those with fair skin should take precautions against the sun, and should carefully examine their skin once per month, for any new or changed moles.  It is critically important to prevent falling down (keep floor areas well-lit, dry, and free of loose objects.  If you have a cane, walker, or wheelchair, you should use it, even for short trips around the house.  Wear flat-soled shoes.  Also, try not to rush).  blood tests are requested for you today.  We'll let you know about the results.   Please come back for a follow-up appointment in 1 month.

## 2018-09-02 ENCOUNTER — Telehealth: Payer: Self-pay | Admitting: *Deleted

## 2018-09-02 NOTE — Telephone Encounter (Signed)
Urine drug screen had no medications.  Larry Robertson reported that "08/01/18 filled(sig: 1q4 hours prn)  #60   Had #0 @appointmen  9/23t, but last dose reported taken last week, which would technically make the uds consistent.

## 2018-09-02 NOTE — Telephone Encounter (Signed)
The patient told me he was taking 2 a day and if you look at his history he has been taking 2 a day even though its written as 1 p.o. every 4 hours as needed I would expected to last for 1 month

## 2018-09-13 ENCOUNTER — Other Ambulatory Visit: Payer: Self-pay

## 2018-09-13 DIAGNOSIS — E119 Type 2 diabetes mellitus without complications: Secondary | ICD-10-CM | POA: Diagnosis not present

## 2018-09-13 DIAGNOSIS — H25813 Combined forms of age-related cataract, bilateral: Secondary | ICD-10-CM | POA: Diagnosis not present

## 2018-09-13 MED ORDER — AMLODIPINE BESYLATE 10 MG PO TABS: 10.0000 mg | ORAL_TABLET | Freq: Every day | ORAL | 1 refills | Status: DC

## 2018-09-19 ENCOUNTER — Other Ambulatory Visit: Payer: Self-pay | Admitting: Internal Medicine

## 2018-09-19 DIAGNOSIS — E781 Pure hyperglyceridemia: Secondary | ICD-10-CM

## 2018-09-20 ENCOUNTER — Ambulatory Visit: Payer: Medicare Other | Admitting: Registered Nurse

## 2018-09-21 ENCOUNTER — Ambulatory Visit (HOSPITAL_BASED_OUTPATIENT_CLINIC_OR_DEPARTMENT_OTHER): Payer: Medicare Other | Admitting: Physical Medicine & Rehabilitation

## 2018-09-21 ENCOUNTER — Encounter: Payer: Medicare Other | Attending: Physical Medicine & Rehabilitation

## 2018-09-21 ENCOUNTER — Encounter: Payer: Self-pay | Admitting: Physical Medicine & Rehabilitation

## 2018-09-21 VITALS — BP 165/96 | HR 71 | Resp 16 | Ht 73.0 in | Wt 216.0 lb

## 2018-09-21 DIAGNOSIS — M24512 Contracture, left shoulder: Secondary | ICD-10-CM

## 2018-09-21 DIAGNOSIS — I10 Essential (primary) hypertension: Secondary | ICD-10-CM | POA: Diagnosis not present

## 2018-09-21 DIAGNOSIS — G8929 Other chronic pain: Secondary | ICD-10-CM | POA: Diagnosis not present

## 2018-09-21 DIAGNOSIS — M545 Low back pain, unspecified: Secondary | ICD-10-CM

## 2018-09-21 DIAGNOSIS — E119 Type 2 diabetes mellitus without complications: Secondary | ICD-10-CM | POA: Diagnosis not present

## 2018-09-21 DIAGNOSIS — M25519 Pain in unspecified shoulder: Secondary | ICD-10-CM | POA: Diagnosis not present

## 2018-09-21 DIAGNOSIS — M961 Postlaminectomy syndrome, not elsewhere classified: Secondary | ICD-10-CM

## 2018-09-21 DIAGNOSIS — Z9889 Other specified postprocedural states: Secondary | ICD-10-CM | POA: Insufficient documentation

## 2018-09-21 MED ORDER — PREGABALIN 50 MG PO CAPS: 50.0000 mg | ORAL_CAPSULE | Freq: Two times a day (BID) | ORAL | 0 refills | Status: DC

## 2018-09-21 MED ORDER — OXYCODONE-ACETAMINOPHEN 10-325 MG PO TABS: 1.0000 | ORAL_TABLET | ORAL | 0 refills | Status: DC | PRN

## 2018-09-21 NOTE — Progress Notes (Signed)
Subjective:    Patient ID: Larry Robertson, male    DOB: [birth year], 77 y.o.   MRN: [id number] 77 year old male with history of multiple pain complaints that have been going on for many years.  Pains include upper back lower back leg and left shoulder. Patient does not have any one particular pain that is more intense than the others. Left shoulder has been hurting the longest.  The patient underwent cervical fusion March 04, 2018.  Cervical fusion was ACDF at C3-C4 level.  Patient did not feel like this surgery was helpful in alleviating his left shoulder pain.  Also did not have any relief of neck pain.  He has been told that he had nerve damage affecting the left shoulder. Shoulder bothers him constantly.  Cannot elevate arm overhead  Currently on oxycodone 10mg  BID for pain This has been the average over the last 36mo, prescribed by Dr Vertell Limber or Loanne Drilling  Dr Loanne Drilling no longer following pt as PCP, just for endocrine issues  Has had spine but not shoulder injections  Shoulder MRI from 2005 reviewed, evidence of rotator cuff degeneration as well as glenohumeral and acromioclavicular arthritis.  There is also labral cuff degeneration.  The patient has not undergone any surgery for the left shoulder.  Hx of Lumbar surgery 2005, x-rays of the lumbar spine reviewed from 2018 which did not show any metallic hardware. HPI Has been through PT Had lumbar injections at Dr Melven Sartorius office Out of pain meds for several days  UDS result reviewed, pt was out of meds when sample was taken Pain Inventory Average Pain 7 Pain Right Now 7 My pain is aching  In the last 24 hours, has pain interfered with the following? General activity 4 Relation with others 5 Enjoyment of life 5 What TIME of day is your pain at its worst? morning, daytime, evening Sleep (in general) Poor  Pain is worse with: walking, bending, sitting, inactivity, standing and some activites Pain improves with: medication Relief  from Meds: 9  Mobility walk without assistance how many minutes can you walk? 30 ability to climb steps?  yes do you drive?  yes  Function retired Do you have any goals in this area?  no  Neuro/Psych trouble walking  Prior Studies Any changes since last visit?  no  Physicians involved in your care Any changes since last visit?  no   Family History  Problem Relation Age of Onset  . Diabetes Maternal Grandmother   . Cancer Neg Hx   . Colon cancer Neg Hx   . Esophageal cancer Neg Hx   . Rectal cancer Neg Hx   . Stomach cancer Neg Hx    Social History   Socioeconomic History  . Marital status: Single    Spouse name: Not on file  . Number of children: 7  . Years of education: Not on file  . Highest education level: Not on file  Occupational History  . 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 Financial resource strain: Not on file  . Food insecurity:    Worry: Not on file    Inability: Not on file  . Transportation needs:    Medical: Not on file    Non-medical: Not on file  Tobacco Use  . Smoking status: Never Smoker  . Smokeless tobacco: Never Used  Substance and Sexual Activity  . Alcohol use: Yes    Comment: 3-4 beers per week; no alcohol per pt  as of 02-09-17 visit  . Drug use: No  . Sexual activity: Not on file  Lifestyle  . Physical activity:    Days per week: Not on file    Minutes per session: Not on file  . Stress: Not on file  Relationships  . Social connections:    Talks on phone: Not on file    Gets together: Not on file    Attends religious service: Not on file    Active member of club or organization: Not on file    Attends meetings of clubs or organizations: Not on file    Relationship status: Not on file  Other Topics Concern  . Not on file  Social History Narrative   Divorced   Past Surgical History:  Procedure Laterality Date  . ABDOMINAL HERNIA REPAIR  2000  . ANTERIOR CERVICAL  DECOMP/DISCECTOMY FUSION Right 03/04/2018   Procedure: Cervical Three-Four, Cervical Six-Seven Right Side approach, Anterior cervical decompression/discectomy/fusion;  Surgeon: Erline Levine, MD;  Location: [facility];  Service: Neurosurgery;  Laterality: Right;  right side anterior approach  . CHOLECYSTECTOMY  1999  . COLONOSCOPY    . LUMBAR FUSION  2005  . TONSILLECTOMY AND ADENOIDECTOMY    . UPPER GASTROINTESTINAL ENDOSCOPY     Past Medical History:  Diagnosis Date  . Arthritis   . Blood transfusion without reported diagnosis   . Colon polyps 05/19/2012  . 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 DIABETES MELLITUS, TYPE II 08/25/2007  . Diastolic dysfunction    on echo  . Diverticulosis   . ERECTILE DYSFUNCTION 08/25/2007  . 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 Heart murmur    but was not stated by DR Hilty  . Hepatitis    took treatment  . HEPATITIS C 08/25/2007  . HYPERTENSION 08/25/2007  . 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 HYPOGONADISM 09/27/2009   BP (!) 165/96 (BP Location: Left Arm, Patient Position: Sitting, Cuff Size: Normal)   Pulse 71   Resp 16   Ht 6\' 1"  (1.854 m)   Wt 216 lb (98 kg)   SpO2 95%   BMI 28.50 kg/m   Opioid Risk Score:   Fall Risk Score:  `1  Depression screen PHQ 2/9  No flowsheet data found.  Review of Systems  Constitutional: Negative.   HENT: Negative.   Eyes: Negative.   Respiratory: Negative.   Cardiovascular: Negative.   Gastrointestinal: Negative.   Endocrine: Negative.   Genitourinary: Negative.   Musculoskeletal: Positive for arthralgias, back pain and gait problem.  Skin: Negative.   Allergic/Immunologic: Negative.   Hematological: Negative.   Psychiatric/Behavioral: Negative.   All other systems reviewed and are negative.      Objective:   Physical Exam  Constitutional: He is oriented to person, place, and time. He appears well-developed and well-nourished. No distress.  HENT:  Head: Normocephalic  and atraumatic.  Eyes: Pupils are equal, round, and reactive to light. EOM are normal.  Musculoskeletal: Normal range of motion.  Neurological: He is alert and oriented to person, place, and time.  Skin: He is not diaphoretic.  Nursing note and vitals reviewed.  motor strength is 5/5 bilateral deltoid bicep tricep grip Shoulder range of motion mildly decreased with internal/external rotation left side compared to the right side as well as abduction mildly decreased. Sensation intact light touch bilateral upper and lower limb Ambulates without assistive device no evidence of toe drag or knee stability Lumbar spine has 50% range forward flexion extension lateral bending  and rotation.       Assessment & Plan:  1.  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 2.  Cervical postlaminectomy syndrome he has intermittent pain but this is not his disabling as his low back pain.  His left upper extremity pain also appears to be more episodic.  I do not appreciate any weakness on the left side

## 2018-09-29 ENCOUNTER — Ambulatory Visit (INDEPENDENT_AMBULATORY_CARE_PROVIDER_SITE_OTHER): Payer: Medicare Other | Admitting: Endocrinology

## 2018-09-29 ENCOUNTER — Encounter: Payer: Self-pay | Admitting: Endocrinology

## 2018-09-29 ENCOUNTER — Other Ambulatory Visit: Payer: Self-pay | Admitting: Endocrinology

## 2018-09-29 VITALS — BP 140/78 | HR 72 | Ht 73.0 in | Wt 211.6 lb

## 2018-09-29 DIAGNOSIS — M545 Low back pain, unspecified: Secondary | ICD-10-CM

## 2018-09-29 DIAGNOSIS — G8929 Other chronic pain: Secondary | ICD-10-CM

## 2018-09-29 NOTE — Progress Notes (Signed)
   Subjective:    Patient ID: Larry Robertson, male    DOB: 1941/09/01, 77 y.o.   MRN: [id number]  HPI Pt returns for chronic opioid rx:  Indication for chronic opioid: he has many years of very severe pain at the lower back, but no assoc numbness.   Medication: percocet 10/325 # pills per month: 60.   Last annual UDS date: 11/23/17. Paincontract signed: 07/01/18 [employer] narcotic database:  Non-opiod rx: lower back surg twice--did not help. Robaxin did not help; he has had L-spine injections and C-spine surgery (2019 and 2004); he did not tolerate gabapentin (rash); he takes duloxitene now.  Pt says this has not helped.  He recently started Lyrica.    Pain Inventory (1-10 worse): Average Pain: 7 Pain Right Now: 6 Pain is worse with: nothing Relief from Meds: very good.    In the last 24 hours, has pain interfered with the following (1-10 greatest interference)?  General activity: 7 Relation with others: 1 Enjoyment of life: 9 What TIME of day is your pain at its worst? No particular time.       Sleep (in general): 8  Mobility/Function: Assistance device: none How many minutes can you walk? 15 Ability to climb steps? yes, with difficulty Do you drive? yes Disabled (date): never   Review of Systems He also has pain at the back of the neck and left shoulder.     Objective:   Physical Exam VITAL SIGNS:  See vs page GENERAL: no distress GAIT: normal and steady.     Assessment & Plan:  Chronic pain syndrome: stable. he declines to increase duloxetine.   AF: this limits rx options  Patient Instructions  Please continue the same medications.   Please come back for a follow-up appointment in 4 months, for the diabetes.

## 2018-09-29 NOTE — Patient Instructions (Addendum)
Please continue the same medications.   Please come back for a follow-up appointment in 4 months, for the diabetes.

## 2018-10-04 ENCOUNTER — Telehealth: Payer: Self-pay | Admitting: Endocrinology

## 2018-10-04 NOTE — Telephone Encounter (Signed)
Please advise 

## 2018-10-04 NOTE — Telephone Encounter (Signed)
Called pt and informed him about his Rx, prescriber and location of his prescription. Advised he call Dr. Joycelyn Schmid office to inquire further.

## 2018-10-04 NOTE — Telephone Encounter (Signed)
Patient is stating they where supposed to get a RX refill on November 1st for his pain medication Percocet. Please Advise [id number]

## 2018-10-04 NOTE — Telephone Encounter (Signed)
This was rx'ed by Dr Ella Bodo on 09/21/18, to [facility].

## 2018-10-18 ENCOUNTER — Other Ambulatory Visit: Payer: Self-pay

## 2018-10-18 ENCOUNTER — Encounter: Payer: Medicare Other | Attending: Physical Medicine & Rehabilitation | Admitting: Registered Nurse

## 2018-10-18 ENCOUNTER — Telehealth: Payer: Self-pay | Admitting: Family Medicine

## 2018-10-18 ENCOUNTER — Encounter: Payer: Self-pay | Admitting: Registered Nurse

## 2018-10-18 VITALS — BP 164/82 | HR 79 | Ht 73.0 in | Wt 216.0 lb

## 2018-10-18 DIAGNOSIS — M25519 Pain in unspecified shoulder: Secondary | ICD-10-CM | POA: Diagnosis not present

## 2018-10-18 DIAGNOSIS — Z79891 Long term (current) use of opiate analgesic: Secondary | ICD-10-CM | POA: Diagnosis not present

## 2018-10-18 DIAGNOSIS — M24512 Contracture, left shoulder: Secondary | ICD-10-CM | POA: Diagnosis not present

## 2018-10-18 DIAGNOSIS — G894 Chronic pain syndrome: Secondary | ICD-10-CM | POA: Diagnosis not present

## 2018-10-18 DIAGNOSIS — Z9889 Other specified postprocedural states: Secondary | ICD-10-CM | POA: Diagnosis not present

## 2018-10-18 DIAGNOSIS — M545 Low back pain, unspecified: Secondary | ICD-10-CM

## 2018-10-18 DIAGNOSIS — G8929 Other chronic pain: Secondary | ICD-10-CM | POA: Diagnosis not present

## 2018-10-18 DIAGNOSIS — I10 Essential (primary) hypertension: Secondary | ICD-10-CM | POA: Insufficient documentation

## 2018-10-18 DIAGNOSIS — M961 Postlaminectomy syndrome, not elsewhere classified: Secondary | ICD-10-CM | POA: Diagnosis not present

## 2018-10-18 DIAGNOSIS — Z5181 Encounter for therapeutic drug level monitoring: Secondary | ICD-10-CM

## 2018-10-18 DIAGNOSIS — E119 Type 2 diabetes mellitus without complications: Secondary | ICD-10-CM | POA: Insufficient documentation

## 2018-10-18 MED ORDER — OXYCODONE-ACETAMINOPHEN 10-325 MG PO TABS: 1.0000 | ORAL_TABLET | Freq: Three times a day (TID) | ORAL | 0 refills | Status: DC | PRN

## 2018-10-18 NOTE — Progress Notes (Signed)
Subjective:    Patient ID: Larry Robertson, male    DOB: 11/23/1941, 77 y.o.   MRN: [id number]  HPI: Mr. Larry Robertson is a 77 year old male who returns for follow up appointment for chronic pain and medication refill. He states his pain is located in his left shoulder, lower back and bilateral lower extremities. He rates his pain 7. His current exercise regime is walking,  And using his home Gym  Using the elliptical for 15 minutes 4 days a week and light weights.  [name] reports his pain has intensified and he is not receiving any relief of his pain with his current dose of oxycodone only receiving 3-4 hours of relief. Mr. Hands was following his Sig and today he's out of medication, this was discussed with Dr. Letta Pate UDS cancelled this month. We will increase his Oxycodone to TID as needed for pain, he was also educated on the narcotic policy and instructed to call office in two weeks to evaluate medication regimen, he verbalizes understanding.   [name] Morphine Equivalent is 30.00 MME.  Last UDS was Performed on 08/23/2018, it was consistent.   Pain Inventory Average Pain 6 Pain Right Now 7 My pain is sharp and aching  In the last 24 hours, has pain interfered with the following? General activity 5 Relation with others 2 Enjoyment of life 6 What TIME of day is your pain at its worst? night Sleep (in general) Fair  Pain is worse with: none Pain improves with: medication Relief from Meds: 9  Mobility walk without assistance how many minutes can you walk? 30 ability to climb steps?  yes do you drive?  yes  Function retired  Neuro/Psych No problems in this area  Prior Studies Any changes since last visit?  no  Physicians involved in your care Any changes since last visit?  no   Family History  Problem Relation Age of Onset  . Diabetes Maternal Grandmother   . Cancer Neg Hx   . Colon cancer Neg Hx   . Esophageal cancer Neg Hx   . Rectal cancer Neg Hx     . Stomach cancer Neg Hx    Social History   Socioeconomic History  . Marital status: Single    Spouse name: Not on file  . Number of children: 7  . Years of education: Not on file  . Highest education level: Not on file  Occupational History  . 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 Financial resource strain: Not on file  . Food insecurity:    Worry: Not on file    Inability: Not on file  . Transportation needs:    Medical: Not on file    Non-medical: Not on file  Tobacco Use  . Smoking status: Never Smoker  . Smokeless tobacco: Never Used  Substance and Sexual Activity  . Alcohol use: Yes    Comment: 3-4 beers per week; no alcohol per pt as of 02-09-17 visit  . Drug use: No  . Sexual activity: Not on file  Lifestyle  . Physical activity:    Days per week: Not on file    Minutes per session: Not on file  . Stress: Not on file  Relationships  . Social connections:    Talks on phone: Not on file    Gets together: Not on file    Attends religious service: Not on file    Active member of  club or organization: Not on file    Attends meetings of clubs or organizations: Not on file    Relationship status: Not on file  Other Topics Concern  . Not on file  Social History Narrative   Divorced   Past Surgical History:  Procedure Laterality Date  . ABDOMINAL HERNIA REPAIR  2000  . ANTERIOR CERVICAL DECOMP/DISCECTOMY FUSION Right 03/04/2018   Procedure: Cervical Three-Four, Cervical Six-Seven Right Side approach, Anterior cervical decompression/discectomy/fusion;  Surgeon: Erline Levine, MD;  Location: [facility];  Service: Neurosurgery;  Laterality: Right;  right side anterior approach  . CHOLECYSTECTOMY  1999  . COLONOSCOPY    . LUMBAR FUSION  2005  . TONSILLECTOMY AND ADENOIDECTOMY    . UPPER GASTROINTESTINAL ENDOSCOPY     Past Medical History:  Diagnosis Date  . Arthritis   . Blood transfusion without reported diagnosis   . Colon  polyps 05/19/2012  . 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 DIABETES MELLITUS, TYPE II 08/25/2007  . Diastolic dysfunction    on echo  . Diverticulosis   . ERECTILE DYSFUNCTION 08/25/2007  . 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 Heart murmur    but was not stated by DR Hilty  . Hepatitis    took treatment  . HEPATITIS C 08/25/2007  . HYPERTENSION 08/25/2007  . 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 HYPOGONADISM 09/27/2009   BP (!) 164/82   Pulse 79   Ht 6\' 1"  (1.854 m)   Wt 216 lb (98 kg)   SpO2 94%   BMI 28.50 kg/m   Opioid Risk Score:   Fall Risk Score:  `1  Depression screen PHQ 2/9  Depression screen PHQ 2/9 10/18/2018  Decreased Interest 0  Down, Depressed, Hopeless 0  PHQ - 2 Score 0  Altered sleeping 0  Tired, decreased energy 2  Change in appetite 0  Feeling bad or failure about yourself  0  Trouble concentrating 0  Moving slowly or fidgety/restless 0  Suicidal thoughts 0  PHQ-9 Score 2    Review of Systems  Constitutional: Negative.   HENT: Negative.   Eyes: Negative.   Respiratory: Negative.   Cardiovascular: Negative.   Gastrointestinal: Negative.   Endocrine: Negative.   Genitourinary: Negative.   Musculoskeletal: Negative.   Skin: Negative.   Allergic/Immunologic: Negative.   Neurological: Negative.   Hematological: Negative.   Psychiatric/Behavioral: Negative.   All other systems reviewed and are negative.      Objective:   Physical Exam  Constitutional: He is oriented to person, place, and time. He appears well-developed and well-nourished.  HENT:  Head: Normocephalic and atraumatic.  Neck: Normal range of motion. Neck supple.  Cardiovascular: Normal rate and regular rhythm.  Pulmonary/Chest: Effort normal and breath sounds normal.  Musculoskeletal:  Normal Muscle Bulk and Muscle Testing Reveals:  Upper Extremities: Right: Full ROM and Muscle Strength 4/5 Left: Decreased ROM 45 Degrees and Muscle Strength  4/5 Left AC Joint Tenderness Lumbar Paraspinal Tenderness: L-4-L-5 Lower Extremities: Full ROM and Muscle Strength 5/5 Bilateral Lower Extremities Flexion Produces Pain into Bilateral Lower Extremities Arises from Chair with Ease Narrow Based Gait   Neurological: He is alert and oriented to person, place, and time.  Skin: Skin is warm and dry.  Nursing note and vitals reviewed.         Assessment & Plan:  1. Cervical Postlaminectomy Syndrome: Continue HEP as Tolerated. Continue to Monitor.  2. Contracture of Left Shoulder Joint Region: Continue HEP as Tolerated. 3. 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 Continue to Monitor.  4. Chronic Pain Syndrome: Increased: Oxycodone 10/325 mg one tablet three times a day as needed for pain #90.  We will continue the opioid monitoring program, this consists of regular clinic visits, examinations, urine drug screen, pill counts as well as use of [place] Controlled Substance Reporting system.  30 minutes of face to face patient care time was spent during this visit. All questions were encouraged and answered.  F/U in 1 month

## 2018-10-18 NOTE — Telephone Encounter (Signed)
Called and spoke with pt regarding their appt with Dr. Brigitte Pulse on 11/02/18. I explained that Dr. Brigitte Pulse is unavailable that day and I was able to get pt rescheduled for 11/05/18 with Dr. Brigitte Pulse at the same time as the original appt. I advised of time, building number and late policy.  Pt was agreeable and acknowledged.

## 2018-11-02 ENCOUNTER — Ambulatory Visit: Payer: Self-pay | Admitting: Family Medicine

## 2018-11-05 ENCOUNTER — Encounter

## 2018-11-05 ENCOUNTER — Encounter: Payer: Self-pay | Admitting: Family Medicine

## 2018-11-05 ENCOUNTER — Ambulatory Visit (INDEPENDENT_AMBULATORY_CARE_PROVIDER_SITE_OTHER): Payer: Medicare Other | Admitting: Family Medicine

## 2018-11-05 ENCOUNTER — Other Ambulatory Visit: Payer: Self-pay

## 2018-11-05 VITALS — BP 161/89 | HR 73 | Temp 98.8°F | Resp 16 | Ht 72.0 in | Wt 217.6 lb

## 2018-11-05 DIAGNOSIS — I10 Essential (primary) hypertension: Secondary | ICD-10-CM

## 2018-11-05 DIAGNOSIS — E781 Pure hyperglyceridemia: Secondary | ICD-10-CM | POA: Diagnosis not present

## 2018-11-05 DIAGNOSIS — K7460 Unspecified cirrhosis of liver: Secondary | ICD-10-CM

## 2018-11-05 MED ORDER — AMLODIPINE-OLMESARTAN 10-40 MG PO TABS: 1.0000 | ORAL_TABLET | Freq: Every day | ORAL | 1 refills | Status: DC

## 2018-11-05 MED ORDER — ATORVASTATIN CALCIUM 20 MG PO TABS: 20.0000 mg | ORAL_TABLET | Freq: Every day | ORAL | 1 refills | Status: DC

## 2018-11-05 MED ORDER — SPIRONOLACTONE 25 MG PO TABS: 25.0000 mg | ORAL_TABLET | Freq: Every day | ORAL | 3 refills | Status: DC

## 2018-11-05 MED ORDER — FUROSEMIDE 40 MG PO TABS: 40.0000 mg | ORAL_TABLET | Freq: Every day | ORAL | 3 refills | Status: DC

## 2018-11-05 NOTE — Patient Instructions (Addendum)
Stop taking the amlodipine and the losartan.  Instead I have put you on a combination medicine of amlodipine AND olmesartan which will hopefully be stronger for your blood pressure.   Do not take the allopurinol unless you eat something that is not on the approved gout diet list.  Never take this medicine when you are starting to have symptoms of a gout flare as it would make it much worse.  If you ever have symptoms of a gout flare starting, please take the colchicine immediately.   If you have lab work done today you will be contacted with your lab results within the next 2 weeks.  If you have not heard from [place] then please contact us. The fastest way to get your results is to register for My Chart.   IF you received an x-ray today, you will receive an invoice from [facility]. Please contact [facility] at [phone] with questions or concerns regarding your invoice.   IF you received labwork today, you will receive an invoice from [facility]. Please contact [facility] at [phone] with questions or concerns regarding your invoice.   Our billing staff will not be able to assist you with questions regarding bills from these companies.  You will be contacted with the lab results as soon as they are available. The fastest way to get your results is to activate your My Chart account. Instructions are located on the last page of this paperwork. If you have not heard from [place] regarding the results in 2 weeks, please contact this office.     Managing Your Hypertension Hypertension is commonly called high blood pressure. This is when the force of your blood pressing against the walls of your arteries is too strong. Arteries are blood vessels that carry blood from your heart throughout your body. Hypertension forces the heart to work harder to pump blood, and may cause the arteries to become narrow or stiff. Having untreated or uncontrolled hypertension can cause heart attack,  stroke, kidney disease, and other problems. What are blood pressure readings? A blood pressure reading consists of a higher number over a lower number. Ideally, your blood pressure should be below 120/80. The first ("top") number is called the systolic pressure. It is a measure of the pressure in your arteries as your heart beats. The second ("bottom") number is called the diastolic pressure. It is a measure of the pressure in your arteries as the heart relaxes. What does my blood pressure reading mean? Blood pressure is classified into four stages. Based on your blood pressure reading, your health care provider may use the following stages to determine what type of treatment you need, if any. Systolic pressure and diastolic pressure are measured in a unit called mm Hg. Normal  Systolic pressure: below 818.  Diastolic pressure: below 80. Elevated  Systolic pressure: 299-371.  Diastolic pressure: below 80. Hypertension stage 1  Systolic pressure: 696-789.  Diastolic pressure: 38-10. Hypertension stage 2  Systolic pressure: 175 or above.  Diastolic pressure: 90 or above. What health risks are associated with hypertension? Managing your hypertension is an important responsibility. Uncontrolled hypertension can lead to:  A heart attack.  A stroke.  A weakened blood vessel (aneurysm).  Heart failure.  Kidney damage.  Eye damage.  Metabolic syndrome.  Memory and concentration problems.  What changes can I make to manage my hypertension? Hypertension can be managed by making lifestyle changes and possibly by taking medicines. Your health care provider will help you make a plan  to bring your blood pressure within a normal range. Eating and drinking  Eat a diet that is high in fiber and potassium, and low in salt (sodium), added sugar, and fat. An example eating plan is called the DASH (Dietary Approaches to Stop Hypertension) diet. To eat this way: ? Eat plenty of fresh  fruits and vegetables. Try to fill half of your plate at each meal with fruits and vegetables. ? Eat whole grains, such as whole wheat pasta, brown rice, or whole grain bread. Fill about one quarter of your plate with whole grains. ? Eat low-fat diary products. ? Avoid fatty cuts of meat, processed or cured meats, and poultry with skin. Fill about one quarter of your plate with lean proteins such as fish, chicken without skin, beans, eggs, and tofu. ? Avoid premade and processed foods. These tend to be higher in sodium, added sugar, and fat.  Reduce your daily sodium intake. Most people with hypertension should eat less than 1,500 mg of sodium a day.  Limit alcohol intake to no more than 1 drink a day for nonpregnant women and 2 drinks a day for men. One drink equals 12 oz of beer, 5 oz of wine, or 1 oz of hard liquor. Lifestyle  Work with your health care provider to maintain a healthy body weight, or to lose weight. Ask what an ideal weight is for you.  Get at least 30 minutes of exercise that causes your heart to beat faster (aerobic exercise) most days of the week. Activities may include walking, swimming, or biking.  Include exercise to strengthen your muscles (resistance exercise), such as weight lifting, as part of your weekly exercise routine. Try to do these types of exercises for 30 minutes at least 3 days a week.  Do not use any products that contain nicotine or tobacco, such as cigarettes and e-cigarettes. If you need help quitting, ask your health care provider.  Control any long-term (chronic) conditions you have, such as high cholesterol or diabetes. Monitoring  Monitor your blood pressure at home as told by your health care provider. Your personal target blood pressure may vary depending on your medical conditions, your age, and other factors.  Have your blood pressure checked regularly, as often as told by your health care provider. Working with your health care  provider  Review all the medicines you take with your health care provider because there may be side effects or interactions.  Talk with your health care provider about your diet, exercise habits, and other lifestyle factors that may be contributing to hypertension.  Visit your health care provider regularly. Your health care provider can help you create and adjust your plan for managing hypertension. Will I need medicine to control my blood pressure? Your health care provider may prescribe medicine if lifestyle changes are not enough to get your blood pressure under control, and if:  Your systolic blood pressure is 130 or higher.  Your diastolic blood pressure is 80 or higher.  Take medicines only as told by your health care provider. Follow the directions carefully. Blood pressure medicines must be taken as prescribed. The medicine does not work as well when you skip doses. Skipping doses also puts you at risk for problems. Contact a health care provider if:  You think you are having a reaction to medicines you have taken.  You have repeated (recurrent) headaches.  You feel dizzy.  You have swelling in your ankles.  You have trouble with your vision. Get help  right away if:  You develop a severe headache or confusion.  You have unusual weakness or numbness, or you feel faint.  You have severe pain in your chest or abdomen.  You vomit repeatedly.  You have trouble breathing. Summary  Hypertension is when the force of blood pumping through your arteries is too strong. If this condition is not controlled, it may put you at risk for serious complications.  Your personal target blood pressure may vary depending on your medical conditions, your age, and other factors. For most people, a normal blood pressure is less than 120/80.  Hypertension is managed by lifestyle changes, medicines, or both. Lifestyle changes include weight loss, eating a healthy, low-sodium diet, exercising  more, and limiting alcohol. This information is not intended to replace advice given to you by your health care provider. Make sure you discuss any questions you have with your health care provider. Document Released: 08/11/2012 Document Revised: 10/15/2016 Document Reviewed: 10/15/2016 Elsevier Interactive Patient Education  2018 [employer].  Cirrhosis Cirrhosis is long-term (chronic) liver injury. The liver is your largest internal organ, and it performs many functions. The liver converts food into energy, removes toxic material from your blood, makes important proteins, and absorbs necessary vitamins from your diet. If you have cirrhosis, it means many of your healthy liver cells have been replaced by scar tissue. This prevents blood from flowing through your liver, which makes it difficult for your liver to function. This scarring is not reversible, but treatment can prevent it from getting worse. What are the causes? Hepatitis C and long-term alcohol abuse are the most common causes of cirrhosis. Other causes include:  Nonalcoholic fatty liver disease.  Hepatitis B infection.  Autoimmune hepatitis.  Diseases that cause blockage of ducts inside the liver.  Inherited liver diseases.  Reactions to certain long-term medicines.  Parasitic infections.  Long-term exposure to certain toxins.  What increases the risk? You may have a higher risk of cirrhosis if you:  Have certain hepatitis viruses.  Abuse alcohol, especially if you are male.  Are overweight.  Share needles.  Have unprotected sex with someone who has hepatitis.  What are the signs or symptoms? You may not have any signs and symptoms at first. Symptoms may not develop until the damage to your liver starts to get worse. Signs and symptoms of cirrhosis may include:  Tenderness in the right-upper part of your abdomen.  Weakness and tiredness (fatigue).  Loss of appetite.  Nausea.  Weight loss and muscle  loss.  Itchiness.  Yellow skin and eyes (jaundice).  Buildup of fluid in the abdomen (ascites).  Swelling of the feet and ankles (edema).  Appearance of tiny blood vessels under the skin.  Mental confusion.  Easy bruising and bleeding.  How is this diagnosed? Your health care provider may suspect cirrhosis based on your symptoms and medical history, especially if you have other medical conditions or a history of alcohol abuse. Your health care provider will do a physical exam to feel your liver and check for signs of cirrhosis. Your health care provider may perform other tests, including:  Blood tests to check: ? Whether you have hepatitis B or C. ? Kidney function. ? Liver function.  Imaging tests such as: ? MRI or CT scan to look for changes seen in advanced cirrhosis. ? Ultrasound to see if normal liver tissue is being replaced by scar tissue.  A procedure using a long needle to take a sample of liver tissue (biopsy) for  examination under a microscope. Liver biopsy can confirm the diagnosis of cirrhosis.  How is this treated? Treatment depends on how damaged your liver is and what caused the damage. Treatment may include treating cirrhosis symptoms or treating the underlying causes of the condition to try to slow the progression of the damage. Treatment may include:  Making lifestyle changes, such as: ? Eating a healthy diet. ? Restricting salt intake. ? Maintaining a healthy weight. ? Not abusing drugs or alcohol.  Taking medicines to: ? Treat liver infections or other infections. ? Control itching. ? Reduce fluid buildup. ? Reduce certain blood toxins. ? Reduce risk of bleeding from enlarged blood vessels in the stomach or esophagus (varices).  If varices are causing bleeding problems, you may need treatment with a procedure that ties up the vessels causing them to fall off (band ligation).  If cirrhosis is causing your liver to fail, your health care provider may  recommend a liver transplant.  Other treatments may be recommended depending on any complications of cirrhosis, such as liver-related kidney failure (hepatorenal syndrome).  Follow these instructions at home:  Take medicines only as directed by your health care provider. Do not use drugs that are toxic to your liver. Ask your health care provider before taking any new medicines, including over-the-counter medicines.  Rest as needed.  Eat a well-balanced diet. Ask your health care provider or dietitian for more information.  You may have to follow a low-salt diet or restrict your water intake as directed.  Do not drink alcohol. This is especially important if you are taking acetaminophen.  Keep all follow-up visits as directed by your health care provider. This is important. Contact a health care provider if:  You have fatigue or weakness that is getting worse.  You develop swelling of the hands, feet, legs, or face.  You have a fever.  You develop loss of appetite.  You have nausea or vomiting.  You develop jaundice.  You develop easy bruising or bleeding. Get help right away if:  You vomit bright red blood or a material that looks like coffee grounds.  You have blood in your stools.  Your stools appear black and tarry.  You become confused.  You have chest pain or trouble breathing. This information is not intended to replace advice given to you by your health care provider. Make sure you discuss any questions you have with your health care provider. Document Released: 11/17/2005 Document Revised: 03/27/2016 Document Reviewed: 07/26/2014 Elsevier Interactive Patient Education  [employer].

## 2018-11-05 NOTE — Progress Notes (Signed)
Subjective:    Patient: Larry Robertson  DOB: [birth year]; 77 y.o.   MRN: [id number]  Chief Complaint  Patient presents with  . New pt    estab care, per pt nothing to discuss    HPI Has been followed by Dr. Loanne Drilling as his PCP - his CPE was done 09/01/2018 by Loanne Drilling (has all the criteria for medicare wellness visit but was billed as a [phone] >70 yo CPE - Now Dr. Loanne Drilling is only following for endocrine issues so needs PCP.  Hasn't had any gout flairs in about a year so will stay off of the allopurinol.  HTN: Does check BP outside office   Insomnia: Trazodone makes him to drowsy the next day.   Medical History Past Medical History:  Diagnosis Date  . Arthritis   . Blood transfusion without reported diagnosis   . Colon polyps 05/19/2012  . 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 DIABETES MELLITUS, TYPE II 08/25/2007  . Diastolic dysfunction    on echo  . Diverticulosis   . ERECTILE DYSFUNCTION 08/25/2007  . 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 Heart murmur    but was not stated by DR Hilty  . Hepatitis    took treatment  . HEPATITIS C 08/25/2007  . HYPERTENSION 08/25/2007  . 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 HYPOGONADISM 09/27/2009   Past Surgical History:  Procedure Laterality Date  . ABDOMINAL HERNIA REPAIR  2000  . ANTERIOR CERVICAL DECOMP/DISCECTOMY FUSION Right 03/04/2018   Procedure: Cervical Three-Four, Cervical Six-Seven Right Side approach, Anterior cervical decompression/discectomy/fusion;  Surgeon: Erline Levine, MD;  Location: [place];  Service: Neurosurgery;  Laterality: Right;  right side anterior approach  . CHOLECYSTECTOMY  1999  . COLONOSCOPY    . LUMBAR FUSION  2005  . TONSILLECTOMY AND ADENOIDECTOMY    . UPPER GASTROINTESTINAL ENDOSCOPY     Current Outpatient Medications on File Prior to Visit  Medication Sig Dispense Refill  . allopurinol (ZYLOPRIM) 300 MG tablet Take 300 mg by mouth daily as needed (for gout flares ups.).     Marland Kitchen  amLODipine (NORVASC) 10 MG tablet Take 1 tablet (10 mg total) by mouth daily. 90 tablet 1  . atorvastatin (LIPITOR) 20 MG tablet TAKE 1 TABLET (20 MG TOTAL) AT BEDTIME BY MOUTH. 90 tablet 0  . Blood Glucose Monitoring Suppl (ACCU-CHEK AVIVA) device Use as instructed 1 each 0  . colchicine 0.6 MG tablet 1 tablet every hour as needed for gout, not to exceed 6/day 6 tablet 4  . DULoxetine (CYMBALTA) 60 MG capsule TAKE 1 CAPSULE BY MOUTH EVERY DAY 90 capsule 2  . glucose blood (ACCU-CHEK AVIVA) test strip 1 each by Other route daily. Use as instructed 100 each 3  . JANUVIA 100 MG tablet TAKE 1 TABLET BY MOUTH EVERY DAY 90 tablet 3  . losartan (COZAAR) 100 MG tablet TAKE 1 TABLET (100 MG TOTAL) BY MOUTH DAILY. 90 tablet 3  . metFORMIN (GLUCOPHAGE) 1000 MG tablet TAKE 1 TABLET BY MOUTH 2 TIMES DAILY WITH A MEAL. 180 tablet 1  . oxyCODONE-acetaminophen (PERCOCET) 10-325 MG tablet Take 1 tablet by mouth 3 (three) times daily as needed for pain. 90 tablet 0  . repaglinide (PRANDIN) 0.5 MG tablet Take 1 tablet (0.5 mg total) by mouth 3 (three) times daily before meals. 270 tablet 3  . traZODone (DESYREL) 100 MG tablet Take 1 tablet (100 mg total) by mouth at bedtime. 90 tablet 1   No current facility-administered medications  on file prior to visit.    Allergies  Allergen Reactions  . Lyrica [Pregabalin] Other (See Comments)    Blurred vision  . Gabapentin Rash and Other (See Comments)    Diplopia (double vision)   Family History  Problem Relation Age of Onset  . Diabetes Maternal Grandmother   . Cancer Neg Hx   . Colon cancer Neg Hx   . Esophageal cancer Neg Hx   . Rectal cancer Neg Hx   . Stomach cancer Neg Hx    Social History   Socioeconomic History  . Marital status: Single    Spouse name: Not on file  . Number of children: 7  . Years of education: Not on file  . Highest education level: Not on file  Occupational History  . 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 Financial resource strain: Not on file  . Food insecurity:    Worry: Not on file    Inability: Not on file  . Transportation needs:    Medical: Not on file    Non-medical: Not on file  Tobacco Use  . Smoking status: Never Smoker  . Smokeless tobacco: Never Used  Substance and Sexual Activity  . Alcohol use: Yes    Comment: 3-4 beers per week; no alcohol per pt as of 02-09-17 visit  . Drug use: No  . Sexual activity: Not on file  Lifestyle  . Physical activity:    Days per week: Not on file    Minutes per session: Not on file  . Stress: Not on file  Relationships  . Social connections:    Talks on phone: Not on file    Gets together: Not on file    Attends religious service: Not on file    Active member of club or organization: Not on file    Attends meetings of clubs or organizations: Not on file    Relationship status: Not on file  Other Topics Concern  . Not on file  Social History Narrative   Divorced   Depression screen [facility] 2/9 11/05/2018 10/18/2018  Decreased Interest 0 0  Down, Depressed, Hopeless 0 0  PHQ - 2 Score 0 0  Altered sleeping - 0  Tired, decreased energy - 2  Change in appetite - 0  Feeling bad or failure about yourself  - 0  Trouble concentrating - 0  Moving slowly or fidgety/restless - 0  Suicidal thoughts - 0  PHQ-9 Score - 2    ROS As noted in HPI  Objective:  BP (!) 163/94 (BP Location: Right Arm, Patient Position: Sitting, Cuff Size: Normal)   Pulse 73   Temp 98.8 F (37.1 C) (Oral)   Resp 16   Ht 6' (1.829 m)   Wt 217 lb 9.6 oz (98.7 kg)   SpO2 95%   BMI 29.51 kg/m  Physical Exam Constitutional:      General: He is not in acute distress.    Appearance: He is well-developed. He is not diaphoretic.  HENT:     Head: Normocephalic and atraumatic.  Eyes:     General: No scleral icterus.    Conjunctiva/sclera: Conjunctivae normal.     Pupils: Pupils are equal, round, and reactive to light.  Neck:      Musculoskeletal: Normal range of motion and neck supple.     Thyroid: No thyromegaly.  Cardiovascular:     Rate and Rhythm: Normal rate and regular rhythm.  Heart sounds: Normal heart sounds.  Pulmonary:     Effort: Pulmonary effort is normal. No respiratory distress.     Breath sounds: Normal breath sounds.  Abdominal:     General: Bowel sounds are normal. There is no distension.     Palpations: Abdomen is soft. There is no hepatomegaly, splenomegaly, mass or pulsatile mass.     Tenderness: There is no abdominal tenderness.  Lymphadenopathy:     Cervical: No cervical adenopathy.  Skin:    General: Skin is warm and dry.  Neurological:     Mental Status: He is alert and oriented to person, place, and time.  Psychiatric:        Behavior: Behavior normal.     POC TESTING No visits with results within 3 Day(s) from this visit.  Latest known visit with results is:  Office Visit on 09/01/2018  Component Date Value Ref Range Status  . Hemoglobin A1C 09/01/2018 6.6* 4.0 - 5.6 % Final  . Uric Acid, Serum 09/01/2018 5.4  4.0 - 7.8 mg/dL Final  . Color, Urine 09/01/2018 YELLOW  Yellow;Lt. Yellow Final  . APPearance 09/01/2018 CLEAR  Clear Final  . Specific Gravity, Urine 09/01/2018 <=1.005* 1.000 - 1.030 Final  . pH 09/01/2018 6.0  5.0 - 8.0 Final  . Total Protein, Urine 09/01/2018 NEGATIVE  Negative Final  . Urine Glucose 09/01/2018 NEGATIVE  Negative Final  . Ketones, ur 09/01/2018 NEGATIVE  Negative Final  . Bilirubin Urine 09/01/2018 NEGATIVE  Negative Final  . Hgb urine dipstick 09/01/2018 NEGATIVE  Negative Final  . Urobilinogen, UA 09/01/2018 0.2  0.0 - 1.0 Final  . Leukocytes, UA 09/01/2018 NEGATIVE  Negative Final  . Nitrite 09/01/2018 NEGATIVE  Negative Final  . WBC, UA 09/01/2018 0-2/hpf  0-2/hpf Final  . RBC / HPF 09/01/2018 none seen  0-2/hpf Final  . Microalb, Ur 09/01/2018 1.9  0.0 - 1.9 mg/dL Final  . Creatinine,U 09/01/2018 42.7  mg/dL Final  . Microalb Creat  Ratio 09/01/2018 4.5  0.0 - 30.0 mg/g Final  . Total Bilirubin 09/01/2018 1.0  0.2 - 1.2 mg/dL Final  . Bilirubin, Direct 09/01/2018 0.2  0.0 - 0.3 mg/dL Final  . Alkaline Phosphatase 09/01/2018 46  39 - 117 U/L Final  . AST 09/01/2018 22  0 - 37 U/L Final  . ALT 09/01/2018 32  0 - 53 U/L Final  . Total Protein 09/01/2018 7.1  6.0 - 8.3 g/dL Final  . Albumin 09/01/2018 4.6  3.5 - 5.2 g/dL Final  . PSA 09/01/2018 2.37  0.10 - 4.00 ng/mL Final   Test performed using Access [employer]br[employer]ch PSA Assay, a parmagnetic partical, chemiluminecent immunoassay.  . WBC 09/01/2018 6.9  4.0 - 10.5 K/uL Final  . RBC 09/01/2018 4.71  4.22 - 5.81 Mil/uL Final  . Hemoglobin 09/01/2018 14.3  13.0 - 17.0 g/dL Final  . HCT 09/01/2018 41.6  39.0 - 52.0 % Final  . MCV 09/01/2018 88.4  78.0 - 100.0 fl Final  . MCHC 09/01/2018 34.3  30.0 - 36.0 g/dL Final  . RDW 09/01/2018 12.8  11.5 - 15.5 % Final  . Platelets 09/01/2018 194.0  150.0 - 400.0 K/uL Final  . Neutrophils Relative % 09/01/2018 51.8  43.0 - 77.0 % Final  . Lymphocytes Relative 09/01/2018 38.5  12.0 - 46.0 % Final  . Monocytes Relative 09/01/2018 7.4  3.0 - 12.0 % Final  . Eosinophils Relative 09/01/2018 1.6  0.0 - 5.0 % Final  . Basophils Relative 09/01/2018 0.7  0.0 -  3.0 % Final  . Neutro Abs 09/01/2018 3.6  1.4 - 7.7 K/uL Final  . Lymphs Abs 09/01/2018 2.7  0.7 - 4.0 K/uL Final  . Monocytes Absolute 09/01/2018 0.5  0.1 - 1.0 K/uL Final  . Eosinophils Absolute 09/01/2018 0.1  0.0 - 0.7 K/uL Final  . Basophils Absolute 09/01/2018 0.1  0.0 - 0.1 K/uL Final  . Sodium 09/01/2018 139  135 - 145 mEq/L Final  . Potassium 09/01/2018 3.7  3.5 - 5.1 mEq/L Final  . Chloride 09/01/2018 101  96 - 112 mEq/L Final  . CO2 09/01/2018 28  19 - 32 mEq/L Final  . Glucose, Bld 09/01/2018 186* 70 - 99 mg/dL Final  . BUN 09/01/2018 15  6 - 23 mg/dL Final  . Creatinine, Ser 09/01/2018 0.93  0.40 - 1.50 mg/dL Final  . Calcium 09/01/2018 9.7  8.4 - 10.5 mg/dL Final  . GFR  09/01/2018 83.43  >60.00 mL/min Final  . TSH 09/01/2018 1.83  0.35 - 4.50 uIU/mL Final     Assessment & Plan:   1. Essential hypertension   2. HYPERTRIGLYCERIDEMIA   3. Cirrhosis of liver without ascites, unspecified hepatic cirrhosis type [facility])    Patient will continue on current chronic medications other than changes noted above, so ok to refill when needed.   See after visit summary for patient specific instructions.  Orders Placed This Encounter  Procedures  . US ABDOMEN RUQ W/ELASTOGRAPHY    [employer] EPIC 217 lbs No needs Mr/Anthonymichael NPO Midnight  And 301 loc      Standing Status:   Future    Number of Occurrences:   1    Standing Expiration Date:   01/07/2020    Order Specific Question:   Reason for Exam (SYMPTOM  OR DIAGNOSIS REQUIRED)    Answer:   monitor cirrhosis    Order Specific Question:   Preferred imaging location?    Answer:   [facility]  . Care order/instruction:    Scheduling Instructions:     Recheck BP    Meds ordered this encounter  Medications  . atorvastatin (LIPITOR) 20 MG tablet    Sig: Take 1 tablet (20 mg total) by mouth at bedtime.    Dispense:  90 tablet    Refill:  1    Pt does not need currently. Please add this on as refills to current rx.  Marland Kitchen amLODipine-olmesartan (AZOR) 10-40 MG tablet    Sig: Take 1 tablet by mouth daily.    Dispense:  90 tablet    Refill:  1  . spironolactone (ALDACTONE) 25 MG tablet    Sig: Take 1 tablet (25 mg total) by mouth daily.    Dispense:  30 tablet    Refill:  3  . furosemide (LASIX) 40 MG tablet    Sig: Take 1 tablet (40 mg total) by mouth daily.    Dispense:  30 tablet    Refill:  3    Patient verbalized to me that they understand the following: diagnosis, what is being done for them, what to expect and what should be done at home.  Their questions have been answered. They understand that I am unable to predict every possible medication interaction or adverse outcome and that if any unexpected  symptoms arise, they should contact us and their pharmacist, as well as never hesitate to seek urgent/emergent care at [facility] or ER if they think it might be warranted.    Delman Cheadle, MD, MPH Primary  Care at [address] [place], [place]  [phone] Office phone  [phone] Office fax  11/05/18 1:39 PM

## 2018-11-08 ENCOUNTER — Encounter: Payer: Self-pay | Admitting: Registered Nurse

## 2018-11-08 ENCOUNTER — Other Ambulatory Visit: Payer: Self-pay

## 2018-11-08 ENCOUNTER — Encounter: Payer: Medicare Other | Attending: Physical Medicine & Rehabilitation | Admitting: Registered Nurse

## 2018-11-08 VITALS — BP 143/83 | HR 77 | Ht 73.0 in | Wt 216.6 lb

## 2018-11-08 DIAGNOSIS — G8929 Other chronic pain: Secondary | ICD-10-CM | POA: Diagnosis not present

## 2018-11-08 DIAGNOSIS — M25519 Pain in unspecified shoulder: Secondary | ICD-10-CM | POA: Diagnosis not present

## 2018-11-08 DIAGNOSIS — G894 Chronic pain syndrome: Secondary | ICD-10-CM

## 2018-11-08 DIAGNOSIS — M5416 Radiculopathy, lumbar region: Secondary | ICD-10-CM

## 2018-11-08 DIAGNOSIS — Z9889 Other specified postprocedural states: Secondary | ICD-10-CM | POA: Diagnosis not present

## 2018-11-08 DIAGNOSIS — M542 Cervicalgia: Secondary | ICD-10-CM

## 2018-11-08 DIAGNOSIS — Z5181 Encounter for therapeutic drug level monitoring: Secondary | ICD-10-CM

## 2018-11-08 DIAGNOSIS — M961 Postlaminectomy syndrome, not elsewhere classified: Secondary | ICD-10-CM

## 2018-11-08 DIAGNOSIS — Z79891 Long term (current) use of opiate analgesic: Secondary | ICD-10-CM | POA: Diagnosis not present

## 2018-11-08 DIAGNOSIS — E119 Type 2 diabetes mellitus without complications: Secondary | ICD-10-CM | POA: Diagnosis not present

## 2018-11-08 DIAGNOSIS — M24512 Contracture, left shoulder: Secondary | ICD-10-CM

## 2018-11-08 DIAGNOSIS — I10 Essential (primary) hypertension: Secondary | ICD-10-CM | POA: Insufficient documentation

## 2018-11-08 MED ORDER — OXYCODONE-ACETAMINOPHEN 10-325 MG PO TABS: 1.0000 | ORAL_TABLET | Freq: Three times a day (TID) | ORAL | 0 refills | Status: DC | PRN

## 2018-11-08 NOTE — Progress Notes (Signed)
Subjective:    Patient ID: Larry Robertson, male    DOB: [birth year], 77 y.o.   MRN: [id number]  HPI: Larry Robertson is a 77 y.o. male who returns for follow up appointment for chronic pain and medication refill. He states his pain is located in his neck, left shoulder,mid- lower back radiating into his bilateral lower extremities . Mr. Harshfield describes his pain as deep pain. He rates his  pain 8. His current exercise regime is walking.  Mr. Guerreiro Morphine equivalent is 45.00 MME. Last UDS was Performed on 08/23/2018, see note for details. UDS ordered today.   Pain Inventory Average Pain 6 Pain Right Now 8 My pain is sharp  In the last 24 hours, has pain interfered with the following? General activity 8 Relation with others 8 Enjoyment of life 8 What TIME of day is your pain at its worst? all Sleep (in general) Fair  Pain is worse with: standing Pain improves with: medication Relief from Meds: 8  Mobility walk without assistance how many minutes can you walk? 15 ability to climb steps?  yes do you drive?  yes  Function not employed: date last employed n/a Do you have any goals in this area?  no  Neuro/Psych No problems in this area  Prior Studies Any changes since last visit?  no  Physicians involved in your care Any changes since last visit?  no   Family History  Problem Relation Age of Onset  . Stroke Mother   . Diabetes Maternal Grandmother   . Cancer Neg Hx   . Colon cancer Neg Hx   . Esophageal cancer Neg Hx   . Rectal cancer Neg Hx   . Stomach cancer Neg Hx    Social History   Socioeconomic History  . Marital status: Single    Spouse name: Not on file  . Number of children: 7  . Years of education: Not on file  . Highest education level: Not on file  Occupational History  . 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 Financial resource strain: Not on file  . Food insecurity:    Worry: Not on file    Inability:  Not on file  . Transportation needs:    Medical: Not on file    Non-medical: Not on file  Tobacco Use  . Smoking status: Never Smoker  . Smokeless tobacco: Never Used  Substance and Sexual Activity  . Alcohol use: Yes    Comment: 3-4 beers per week; no alcohol per pt as of 02-09-17 visit  . Drug use: No  . Sexual activity: Not on file  Lifestyle  . Physical activity:    Days per week: Not on file    Minutes per session: Not on file  . Stress: Not on file  Relationships  . Social connections:    Talks on phone: Not on file    Gets together: Not on file    Attends religious service: Not on file    Active member of club or organization: Not on file    Attends meetings of clubs or organizations: Not on file    Relationship status: Not on file  Other Topics Concern  . Not on file  Social History Narrative   Divorced   Past Surgical History:  Procedure Laterality Date  . ABDOMINAL HERNIA REPAIR  2000  . ANTERIOR CERVICAL DECOMP/DISCECTOMY FUSION Right 03/04/2018   Procedure: Cervical Three-Four, Cervical Six-Seven Right Side approach,  Anterior cervical decompression/discectomy/fusion;  Surgeon: Erline Levine, MD;  Location: [place];  Service: Neurosurgery;  Laterality: Right;  right side anterior approach  . CHOLECYSTECTOMY  1999  . COLONOSCOPY    . LUMBAR FUSION  2005  . TONSILLECTOMY AND ADENOIDECTOMY    . UPPER GASTROINTESTINAL ENDOSCOPY     Past Medical History:  Diagnosis Date  . Arthritis   . Blood transfusion without reported diagnosis   . Colon polyps 05/19/2012  . 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 DIABETES MELLITUS, TYPE II 08/25/2007  . Diastolic dysfunction    on echo  . Diverticulosis   . ERECTILE DYSFUNCTION 08/25/2007  . 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 Heart murmur    but was not stated by DR Hilty  . Hepatitis    took treatment  . HEPATITIS C 08/25/2007  . HYPERTENSION 08/25/2007  . 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  HYPOGONADISM 09/27/2009   BP (!) 143/83   Pulse 77   Ht 6\' 1"  (1.854 m)   Wt 216 lb 9.6 oz (98.2 kg)   SpO2 93%   BMI 28.58 kg/m   Opioid Risk Score:   Fall Risk Score:  `1  Depression screen PHQ 2/9  Depression screen [facility] 2/9 11/08/2018 11/05/2018 10/18/2018  Decreased Interest 0 0 0  Down, Depressed, Hopeless 0 0 0  PHQ - 2 Score 0 0 0  Altered sleeping - - 0  Tired, decreased energy - - 2  Change in appetite - - 0  Feeling bad or failure about yourself  - - 0  Trouble concentrating - - 0  Moving slowly or fidgety/restless - - 0  Suicidal thoughts - - 0  PHQ-9 Score - - 2     Review of Systems  Constitutional: Negative.   HENT: Negative.   Eyes: Negative.   Respiratory: Negative.   Cardiovascular: Negative.   Gastrointestinal: Negative.   Endocrine: Negative.   Genitourinary: Negative.   Musculoskeletal: Negative.   Skin: Negative.   Allergic/Immunologic: Negative.   Neurological: Negative.   Hematological: Negative.   Psychiatric/Behavioral: Negative.   All other systems reviewed and are negative.      Objective:   Physical Exam Vitals signs and nursing note reviewed.  Constitutional:      Appearance: Normal appearance.  Neck:     Musculoskeletal: Normal range of motion and neck supple.  Cardiovascular:     Rate and Rhythm: Normal rate and regular rhythm.  Pulmonary:     Effort: Pulmonary effort is normal.     Breath sounds: Normal breath sounds.  Musculoskeletal:     Comments: Normal Muscle Bulk and Muscle Testing Reveals:  Upper Extremities: Right: Decreased ROM 90 Degrees and Muscle Strength 5/5 Left: Decreased ROM  45 Degrees  and Muscle Strength 5/5 Back without spinal tenderness noted Lower Extremities: Full ROM and Muscle Strength 5/5 Bilateral Lower Extremities Flexion Produces Pain into Bilateral Lower Extremities Arises from chair with ease Narrow Based  Gait   Skin:    General: Skin is warm and dry.  Neurological:     Mental Status:  He is alert and oriented to person, place, and time.  Psychiatric:        Mood and Affect: Mood normal.        Behavior: Behavior normal.           Assessment & Plan:  1. Cervical Postlaminectomy Syndrome: Continue HEP as Tolerated. Continue to Monitor. 11/08/2018 2. Contracture of Left Shoulder Joint Region:  Continue HEP as Tolerated. 11/08/2018 3. 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 Continue to Monitor. 11/08/2018 4. Chronic Pain Syndrome:Refilled: Oxycodone 10/325 mg one tablet three times a day as needed for pain #90. 11/08/2018 We will continue the opioid monitoring program, this consists of regular clinic visits, examinations, urine drug screen, pill counts as well as use of [place] Controlled Substance Reporting system.  20 minutes of face to face patient care time was spent during this visit. All questions were encouraged and answered.  F/U in 1 month

## 2018-11-11 ENCOUNTER — Ambulatory Visit
Admission: RE | Admit: 2018-11-11 | Discharge: 2018-11-11 | Disposition: A | Discharge status: Home / Self Care | Payer: Medicare Other | Source: Ambulatory Visit | Attending: Family Medicine | Admitting: Family Medicine

## 2018-11-11 DIAGNOSIS — K7460 Unspecified cirrhosis of liver: Secondary | ICD-10-CM

## 2018-11-15 LAB — TOXASSURE SELECT,+ANTIDEPR,UR

## 2018-11-16 ENCOUNTER — Telehealth: Payer: Self-pay | Admitting: *Deleted

## 2018-11-16 NOTE — Telephone Encounter (Signed)
Urine drug screen is consistent for oxycodone, but is also positive for alcohol.

## 2018-11-18 NOTE — Telephone Encounter (Signed)
Yes needs warning letter

## 2018-11-22 NOTE — Telephone Encounter (Signed)
Letter  of formal warning written and will be mailed to Larry Robertson.

## 2018-12-13 ENCOUNTER — Encounter: Payer: Medicare Other | Attending: Physical Medicine & Rehabilitation | Admitting: Registered Nurse

## 2018-12-13 ENCOUNTER — Encounter: Payer: Self-pay | Admitting: Registered Nurse

## 2018-12-13 VITALS — BP 162/87 | HR 76 | Wt 214.0 lb

## 2018-12-13 DIAGNOSIS — M961 Postlaminectomy syndrome, not elsewhere classified: Secondary | ICD-10-CM | POA: Diagnosis not present

## 2018-12-13 DIAGNOSIS — M542 Cervicalgia: Secondary | ICD-10-CM

## 2018-12-13 DIAGNOSIS — Z5181 Encounter for therapeutic drug level monitoring: Secondary | ICD-10-CM

## 2018-12-13 DIAGNOSIS — G8929 Other chronic pain: Secondary | ICD-10-CM | POA: Diagnosis not present

## 2018-12-13 DIAGNOSIS — I10 Essential (primary) hypertension: Secondary | ICD-10-CM | POA: Insufficient documentation

## 2018-12-13 DIAGNOSIS — Z79891 Long term (current) use of opiate analgesic: Secondary | ICD-10-CM

## 2018-12-13 DIAGNOSIS — M545 Low back pain, unspecified: Secondary | ICD-10-CM

## 2018-12-13 DIAGNOSIS — G894 Chronic pain syndrome: Secondary | ICD-10-CM | POA: Diagnosis not present

## 2018-12-13 DIAGNOSIS — E119 Type 2 diabetes mellitus without complications: Secondary | ICD-10-CM | POA: Insufficient documentation

## 2018-12-13 DIAGNOSIS — M25519 Pain in unspecified shoulder: Secondary | ICD-10-CM | POA: Diagnosis not present

## 2018-12-13 DIAGNOSIS — Z9889 Other specified postprocedural states: Secondary | ICD-10-CM | POA: Diagnosis not present

## 2018-12-13 DIAGNOSIS — M24512 Contracture, left shoulder: Secondary | ICD-10-CM

## 2018-12-13 MED ORDER — OXYCODONE-ACETAMINOPHEN 10-325 MG PO TABS: 1.0000 | ORAL_TABLET | Freq: Three times a day (TID) | ORAL | 0 refills | Status: DC | PRN

## 2018-12-13 NOTE — Progress Notes (Signed)
Subjective:    Patient ID: Larry Robertson, male    DOB: 1941-08-14, 78 y.o.   MRN: [id number]  HPI: Larry Robertson is a 78 y.o. male who returns for follow up appointment for chronic pain and medication refill. He states his pain is located in his neck, left shoulder and lower back radiating into his bilateral lower extremities. He rates his pain 9. His current exercise regime is walking.  Larry Robertson Morphine equivalent is 45.00  MME.  Last UDS was Performed on 11/08/2018, it was inconsistent. We discussed the UDS results and he was educated on the narcotic contract/ policy, he is aware if this occurs again he will be discharged from our office. He verbalizes understanding.  He also forgot his medication bottle, he was instructed to bring his medication to every scheduled visit, he verbalizes understanding.   Pain Inventory Average Pain 8 Pain Right Now 9 My pain is constant and aching  In the last 24 hours, has pain interfered with the following? General activity 8 Relation with others 0 Enjoyment of life 8 What TIME of day is your pain at its worst? na Sleep (in general) Fair  Pain is worse with: na Pain improves with: medication Relief from Meds: 9  Mobility walk without assistance ability to climb steps?  yes do you drive?  yes  Function not employed: date last employed na  Neuro/Psych No problems in this area  Prior Studies Any changes since last visit?  no  Physicians involved in your care Any changes since last visit?  no   Family History  Problem Relation Age of Onset  . Stroke Mother   . Diabetes Maternal Grandmother   . Cancer Neg Hx   . Colon cancer Neg Hx   . Esophageal cancer Neg Hx   . Rectal cancer Neg Hx   . Stomach cancer Neg Hx    Social History   Socioeconomic History  . Marital status: Single    Spouse name: Not on file  . Number of children: 7  . Years of education: Not on file  . Highest education level: Not on file  Occupational  History  . 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 Financial resource strain: Not on file  . Food insecurity:    Worry: Not on file    Inability: Not on file  . Transportation needs:    Medical: Not on file    Non-medical: Not on file  Tobacco Use  . Smoking status: Never Smoker  . Smokeless tobacco: Never Used  Substance and Sexual Activity  . Alcohol use: Yes    Comment: 3-4 beers per week; no alcohol per pt as of 02-09-17 visit  . Drug use: No  . Sexual activity: Not on file  Lifestyle  . Physical activity:    Days per week: Not on file    Minutes per session: Not on file  . Stress: Not on file  Relationships  . Social connections:    Talks on phone: Not on file    Gets together: Not on file    Attends religious service: Not on file    Active member of club or organization: Not on file    Attends meetings of clubs or organizations: Not on file    Relationship status: Not on file  Other Topics Concern  . Not on file  Social History Narrative   Divorced   Past Surgical History:  Procedure Laterality  Date  . ABDOMINAL HERNIA REPAIR  2000  . ANTERIOR CERVICAL DECOMP/DISCECTOMY FUSION Right 03/04/2018   Procedure: Cervical Three-Four, Cervical Six-Seven Right Side approach, Anterior cervical decompression/discectomy/fusion;  Surgeon: Erline Levine, MD;  Location: [facility];  Service: Neurosurgery;  Laterality: Right;  right side anterior approach  . CHOLECYSTECTOMY  1999  . COLONOSCOPY    . LUMBAR FUSION  2005  . TONSILLECTOMY AND ADENOIDECTOMY    . UPPER GASTROINTESTINAL ENDOSCOPY     Past Medical History:  Diagnosis Date  . Arthritis   . Blood transfusion without reported diagnosis   . Colon polyps 05/19/2012  . 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 DIABETES MELLITUS, TYPE II 08/25/2007  . Diastolic dysfunction    on echo  . Diverticulosis   . ERECTILE DYSFUNCTION 08/25/2007  . 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  Heart murmur    but was not stated by DR Hilty  . Hepatitis    took treatment  . HEPATITIS C 08/25/2007  . HYPERTENSION 08/25/2007  . 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 HYPOGONADISM 09/27/2009   BP (!) 162/87 (BP Location: Left Arm, Patient Position: Sitting, Cuff Size: Normal)   Pulse 76   Wt 214 lb (97.1 kg)   SpO2 95%   BMI 28.23 kg/m   Opioid Risk Score:   Fall Risk Score:  `1  Depression screen PHQ 2/9  Depression screen [facility] 2/9 11/08/2018 11/05/2018 10/18/2018  Decreased Interest 0 0 0  Down, Depressed, Hopeless 0 0 0  PHQ - 2 Score 0 0 0  Altered sleeping - - 0  Tired, decreased energy - - 2  Change in appetite - - 0  Feeling bad or failure about yourself  - - 0  Trouble concentrating - - 0  Moving slowly or fidgety/restless - - 0  Suicidal thoughts - - 0  PHQ-9 Score - - 2     Review of Systems  Constitutional: Negative.   HENT: Negative.   Eyes: Negative.   Respiratory: Negative.   Cardiovascular: Negative.   Gastrointestinal: Negative.   Endocrine: Negative.   Genitourinary: Negative.   Musculoskeletal: Positive for arthralgias, back pain, joint swelling and myalgias.  Skin: Negative.   Allergic/Immunologic: Negative.   Neurological: Negative.   Hematological: Negative.   Psychiatric/Behavioral: Negative.   All other systems reviewed and are negative.      Objective:   Physical Exam Vitals signs and nursing note reviewed.  Constitutional:      Appearance: Normal appearance.  Neck:     Musculoskeletal: Normal range of motion and neck supple.  Cardiovascular:     Rate and Rhythm: Normal rate and regular rhythm.     Pulses: Normal pulses.     Heart sounds: Normal heart sounds.  Pulmonary:     Effort: Pulmonary effort is normal.     Breath sounds: Normal breath sounds.  Musculoskeletal:     Comments: Normal Muscle Bulk and Muscle Testing Reveals:  Upper Extremities: Right: Full ROM and Muscle Strength 4/5  Left: Decreased ROM 45  Degrees and Muscle Strength 3/5 Left AC Joint Tenderness Lower Extremities: Full ROM and Muscle Strength 5/5 Arises from chair with ease Narrow Based Gait   Skin:    General: Skin is warm and dry.  Neurological:     Mental Status: He is alert and oriented to person, place, and time.  Psychiatric:        Mood and Affect: Mood normal.        Behavior:  Behavior normal.        Thought Content: Thought content normal.           Assessment & Plan:  1. Cervical Postlaminectomy Syndrome: Continue HEP as Tolerated. Continue to Monitor. 12/13/2018. 2. Contracture of Left Shoulder Joint Region: Continue HEP as Tolerated. 12/13/2018. 3. Chronic Low Back Pain: Continue HEP as Tolerated. Continue to Monitor. 12/13/2018 4. Chronic Pain Syndrome:Refilled: Oxycodone 10/325 mg one tablet three times a day as needed for pain #90.12/13/2018 We will continue the opioid monitoring program, this consists of regular clinic visits, examinations, urine drug screen, pill counts as well as use of [place] Controlled Substance Reporting system.  26minutes of face to face patient care time was spent during this visit. All questions were encouraged and answered.  F/U in 1 month

## 2019-01-08 ENCOUNTER — Other Ambulatory Visit: Payer: Self-pay | Admitting: Endocrinology

## 2019-01-08 NOTE — Telephone Encounter (Signed)
Please forward refill request to pt's new primary care provider.  

## 2019-01-11 ENCOUNTER — Other Ambulatory Visit: Payer: Self-pay

## 2019-01-11 ENCOUNTER — Encounter: Payer: Medicare Other | Attending: Physical Medicine & Rehabilitation | Admitting: Registered Nurse

## 2019-01-11 VITALS — BP 132/86 | HR 78 | Ht 73.0 in | Wt 211.4 lb

## 2019-01-11 DIAGNOSIS — Z9889 Other specified postprocedural states: Secondary | ICD-10-CM | POA: Diagnosis not present

## 2019-01-11 DIAGNOSIS — Z79891 Long term (current) use of opiate analgesic: Secondary | ICD-10-CM

## 2019-01-11 DIAGNOSIS — E119 Type 2 diabetes mellitus without complications: Secondary | ICD-10-CM | POA: Insufficient documentation

## 2019-01-11 DIAGNOSIS — Z5181 Encounter for therapeutic drug level monitoring: Secondary | ICD-10-CM

## 2019-01-11 DIAGNOSIS — M25519 Pain in unspecified shoulder: Secondary | ICD-10-CM | POA: Insufficient documentation

## 2019-01-11 DIAGNOSIS — G894 Chronic pain syndrome: Secondary | ICD-10-CM

## 2019-01-11 DIAGNOSIS — M961 Postlaminectomy syndrome, not elsewhere classified: Secondary | ICD-10-CM | POA: Diagnosis not present

## 2019-01-11 DIAGNOSIS — G8929 Other chronic pain: Secondary | ICD-10-CM | POA: Insufficient documentation

## 2019-01-11 DIAGNOSIS — I10 Essential (primary) hypertension: Secondary | ICD-10-CM | POA: Diagnosis not present

## 2019-01-11 DIAGNOSIS — M542 Cervicalgia: Secondary | ICD-10-CM

## 2019-01-11 DIAGNOSIS — M24512 Contracture, left shoulder: Secondary | ICD-10-CM | POA: Diagnosis not present

## 2019-01-11 DIAGNOSIS — M5416 Radiculopathy, lumbar region: Secondary | ICD-10-CM

## 2019-01-11 MED ORDER — OXYCODONE-ACETAMINOPHEN 10-325 MG PO TABS: 1.0000 | ORAL_TABLET | Freq: Three times a day (TID) | ORAL | 0 refills | Status: DC | PRN

## 2019-01-11 NOTE — Progress Notes (Signed)
Subjective:    Patient ID: Larry Robertson, male    DOB: [birth year], 78 y.o.   MRN: [id number]  HPI: Larry Robertson is a 78 y.o. male who returns for follow up appointment for chronic pain and medication refill. He states his pain is located in his neck radiating into his left shoulder, lower back and bilateral lower extremities. He rates his pain 8. His current exercise regime is walking.  Larry Robertson Morphine equivalent is 45.00  MME.  Last UDS was Performed on 11/08/2018, see note for details.   Pain Inventory Average Pain 7 Pain Right Now 8 My pain is aching  In the last 24 hours, has pain interfered with the following? General activity 4 Relation with others 0 Enjoyment of life 3 What TIME of day is your pain at its worst? all Sleep (in general) Fair  Pain is worse with: walking, bending, sitting, standing and some activites Pain improves with: medication Relief from Meds: 7  Mobility how many minutes can you walk? 15 ability to climb steps?  yes do you drive?  yes  Function retired  Neuro/Psych trouble walking  Prior Studies Any changes since last visit?  no  Physicians involved in your care Any changes since last visit?  no   Family History  Problem Relation Age of Onset  . Stroke Mother   . Diabetes Maternal Grandmother   . Cancer Neg Hx   . Colon cancer Neg Hx   . Esophageal cancer Neg Hx   . Rectal cancer Neg Hx   . Stomach cancer Neg Hx    Social History   Socioeconomic History  . Marital status: Single    Spouse name: Not on file  . Number of children: 7  . Years of education: Not on file  . Highest education level: Not on file  Occupational History  . 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 Financial resource strain: Not on file  . Food insecurity:    Worry: Not on file    Inability: Not on file  . Transportation needs:    Medical: Not on file    Non-medical: Not on file  Tobacco Use  . Smoking  status: Never Smoker  . Smokeless tobacco: Never Used  Substance and Sexual Activity  . Alcohol use: Yes    Comment: 3-4 beers per week; no alcohol per pt as of 02-09-17 visit  . Drug use: No  . Sexual activity: Not on file  Lifestyle  . Physical activity:    Days per week: Not on file    Minutes per session: Not on file  . Stress: Not on file  Relationships  . Social connections:    Talks on phone: Not on file    Gets together: Not on file    Attends religious service: Not on file    Active member of club or organization: Not on file    Attends meetings of clubs or organizations: Not on file    Relationship status: Not on file  Other Topics Concern  . Not on file  Social History Narrative   Divorced   Past Surgical History:  Procedure Laterality Date  . ABDOMINAL HERNIA REPAIR  2000  . ANTERIOR CERVICAL DECOMP/DISCECTOMY FUSION Right 03/04/2018   Procedure: Cervical Three-Four, Cervical Six-Seven Right Side approach, Anterior cervical decompression/discectomy/fusion;  Surgeon: Erline Levine, MD;  Location: [place];  Service: Neurosurgery;  Laterality: Right;  right side anterior approach  .  CHOLECYSTECTOMY  1999  . COLONOSCOPY    . LUMBAR FUSION  2005  . TONSILLECTOMY AND ADENOIDECTOMY    . UPPER GASTROINTESTINAL ENDOSCOPY     Past Medical History:  Diagnosis Date  . Arthritis   . Blood transfusion without reported diagnosis   . Colon polyps 05/19/2012  . 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 DIABETES MELLITUS, TYPE II 08/25/2007  . Diastolic dysfunction    on echo  . Diverticulosis   . ERECTILE DYSFUNCTION 08/25/2007  . 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 Heart murmur    but was not stated by DR Hilty  . Hepatitis    took treatment  . HEPATITIS C 08/25/2007  . HYPERTENSION 08/25/2007  . 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 HYPOGONADISM 09/27/2009   BP 132/86   Pulse 78   Ht 6\' 1"  (1.854 m)   Wt 211 lb 6.4 oz (95.9 kg)   SpO2 93%   BMI  27.89 kg/m   Opioid Risk Score:   Fall Risk Score:  `1  Depression screen PHQ 2/9  Depression screen [facility] 2/9 01/11/2019 11/08/2018 11/05/2018 10/18/2018  Decreased Interest 0 0 0 0  Down, Depressed, Hopeless 0 0 0 0  PHQ - 2 Score 0 0 0 0  Altered sleeping - - - 0  Tired, decreased energy - - - 2  Change in appetite - - - 0  Feeling bad or failure about yourself  - - - 0  Trouble concentrating - - - 0  Moving slowly or fidgety/restless - - - 0  Suicidal thoughts - - - 0  PHQ-9 Score - - - 2    Review of Systems  Constitutional: Negative.   HENT: Negative.   Eyes: Negative.   Respiratory: Negative.   Cardiovascular: Negative.   Gastrointestinal: Negative.   Endocrine: Negative.   Genitourinary: Negative.   Musculoskeletal: Negative.   Skin: Negative.   Allergic/Immunologic: Negative.   Neurological: Negative.   Hematological: Negative.   Psychiatric/Behavioral: Negative.   All other systems reviewed and are negative.      Objective:   Physical Exam Vitals signs and nursing note reviewed.  Constitutional:      Appearance: Normal appearance.  Neck:     Musculoskeletal: Normal range of motion and neck supple.  Cardiovascular:     Rate and Rhythm: Normal rate and regular rhythm.     Pulses: Normal pulses.     Heart sounds: Normal heart sounds.  Pulmonary:     Effort: Pulmonary effort is normal.     Breath sounds: Normal breath sounds.  Musculoskeletal:     Comments: Normal Muscle Bulk and Muscle Testing Reveals:  Upper Extremities: Right: Full ROM and Muscle Strength 4/5 Left: Decreased ROM 45 Degrees and Muscle Strength 3/5  Back without spinal tenderness  ( he describes his pain as deep pain)  Lower Extremities: Decreased ROM and Muscle Strength 5/5 Arises from chair with ease Narrow Based Gait   Skin:    General: Skin is warm and dry.  Neurological:     Mental Status: He is alert and oriented to person, place, and time.  Psychiatric:        Mood and  Affect: Mood normal.        Behavior: Behavior normal.           Assessment & Plan:  1. Cervical Postlaminectomy Syndrome: Continue HEP as Tolerated. Continue to Monitor.01/11/2019. 2. Contracture of Left Shoulder Joint Region: Continue HEP as Tolerated.01/11/2019. 3.  Chronic Low Back Pain: Continue HEP as Tolerated. Continue to Monitor.01/11/2019 4. Chronic Pain Syndrome:Refilled:Oxycodone 10/325 mg one tablet three times a day as needed for pain #90.01/11/2019 We will continue the opioid monitoring program, this consists of regular clinic visits, examinations, urine drug screen, pill counts as well as use of [place] Controlled Substance Reporting system.  49minutes of face to face patient care time was spent during this visit. All questions were encouraged and answered.  F/U in 1 month

## 2019-01-16 ENCOUNTER — Encounter: Payer: Self-pay | Admitting: Registered Nurse

## 2019-01-24 ENCOUNTER — Ambulatory Visit: Payer: Medicare Other | Admitting: Endocrinology

## 2019-01-29 ENCOUNTER — Other Ambulatory Visit: Payer: Self-pay | Admitting: Family Medicine

## 2019-01-31 ENCOUNTER — Encounter: Payer: Self-pay | Admitting: Endocrinology

## 2019-01-31 ENCOUNTER — Other Ambulatory Visit: Payer: Self-pay

## 2019-01-31 ENCOUNTER — Ambulatory Visit (INDEPENDENT_AMBULATORY_CARE_PROVIDER_SITE_OTHER): Payer: Medicare Other | Admitting: Endocrinology

## 2019-01-31 VITALS — BP 124/70 | HR 69 | Ht 73.0 in | Wt 212.0 lb

## 2019-01-31 DIAGNOSIS — E119 Type 2 diabetes mellitus without complications: Secondary | ICD-10-CM

## 2019-01-31 LAB — POCT GLYCOSYLATED HEMOGLOBIN (HGB A1C): Hemoglobin A1C: 7.7 % — AB (ref 4.0–5.6)

## 2019-01-31 MED ORDER — REPAGLINIDE 1 MG PO TABS: 1.0000 mg | ORAL_TABLET | Freq: Three times a day (TID) | ORAL | 3 refills | Status: DC

## 2019-01-31 NOTE — Progress Notes (Signed)
Subjective:    Patient ID: Larry Robertson, male    DOB: 09/10/1941, 78 y.o.   MRN: [id number]  HPI Pt returns for f/u of diabetes mellitus:  DM type: 2 Dx'ed: 2010.  Complications: none.  Therapy: 3 oral meds.  DKA: never.  Severe hypoglycemia: never.  Pancreatitis: never.  Other: edema and nocturia (he no longer takes d-DAVP), limit oral rx options; he has never taken insulin.   Interval history: he does not check cbg's, but he takes meds as rx'ed.   Past Medical History:  Diagnosis Date  . Arthritis   . Blood transfusion without reported diagnosis   . Colon polyps 05/19/2012  . 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 DIABETES MELLITUS, TYPE II 08/25/2007  . Diastolic dysfunction    on echo  . Diverticulosis   . ERECTILE DYSFUNCTION 08/25/2007  . 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 Heart murmur    but was not stated by DR Hilty  . Hepatitis    took treatment  . HEPATITIS C 08/25/2007  . HYPERTENSION 08/25/2007  . 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 HYPOGONADISM 09/27/2009    Past Surgical History:  Procedure Laterality Date  . ABDOMINAL HERNIA REPAIR  2000  . ANTERIOR CERVICAL DECOMP/DISCECTOMY FUSION Right 03/04/2018   Procedure: Cervical Three-Four, Cervical Six-Seven Right Side approach, Anterior cervical decompression/discectomy/fusion;  Surgeon: Erline Levine, MD;  Location: [facility];  Service: Neurosurgery;  Laterality: Right;  right side anterior approach  . CHOLECYSTECTOMY  1999  . COLONOSCOPY    . LUMBAR FUSION  2005  . TONSILLECTOMY AND ADENOIDECTOMY    . UPPER GASTROINTESTINAL ENDOSCOPY      Social History   Socioeconomic History  . Marital status: Single    Spouse name: Not on file  . Number of children: 7  . Years of education: Not on file  . Highest education level: Not on file  Occupational History  . 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 Financial resource strain: Not on file  . Food  insecurity:    Worry: Not on file    Inability: Not on file  . Transportation needs:    Medical: Not on file    Non-medical: Not on file  Tobacco Use  . Smoking status: Never Smoker  . Smokeless tobacco: Never Used  Substance and Sexual Activity  . Alcohol use: Yes    Comment: 3-4 beers per week; no alcohol per pt as of 02-09-17 visit  . Drug use: No  . Sexual activity: Not on file  Lifestyle  . Physical activity:    Days per week: Not on file    Minutes per session: Not on file  . Stress: Not on file  Relationships  . Social connections:    Talks on phone: Not on file    Gets together: Not on file    Attends religious service: Not on file    Active member of club or organization: Not on file    Attends meetings of clubs or organizations: Not on file    Relationship status: Not on file  . Intimate partner violence:    Fear of current or ex partner: Not on file    Emotionally abused: Not on file    Physically abused: Not on file    Forced sexual activity: Not on file  Other Topics Concern  . Not on file  Social History Narrative   Divorced    Current Outpatient  Medications on File Prior to Visit  Medication Sig Dispense Refill  . amLODipine-olmesartan (AZOR) 10-40 MG tablet Take 1 tablet by mouth daily. 90 tablet 1  . atorvastatin (LIPITOR) 20 MG tablet Take 1 tablet (20 mg total) by mouth at bedtime. 90 tablet 1  . Blood Glucose Monitoring Suppl (ACCU-CHEK AVIVA) device Use as instructed 1 each 0  . colchicine 0.6 MG tablet 1 tablet every hour as needed for gout, not to exceed 6/day 6 tablet 4  . DULoxetine (CYMBALTA) 60 MG capsule TAKE 1 CAPSULE BY MOUTH EVERY DAY 90 capsule 2  . furosemide (LASIX) 40 MG tablet Take 1 tablet (40 mg total) by mouth daily. 30 tablet 3  . glucose blood (ACCU-CHEK AVIVA) test strip 1 each by Other route daily. Use as instructed 100 each 3  . JANUVIA 100 MG tablet TAKE 1 TABLET BY MOUTH EVERY DAY 90 tablet 3  . metFORMIN (GLUCOPHAGE) 1000  MG tablet TAKE 1 TABLET BY MOUTH 2 TIMES DAILY WITH A MEAL. 180 tablet 1  . oxyCODONE-acetaminophen (PERCOCET) 10-325 MG tablet Take 1 tablet by mouth 3 (three) times daily as needed for pain. 90 tablet 0  . spironolactone (ALDACTONE) 25 MG tablet Take 1 tablet (25 mg total) by mouth daily. 30 tablet 3   No current facility-administered medications on file prior to visit.     Allergies  Allergen Reactions  . Lyrica [Pregabalin] Other (See Comments)    Blurred vision  . Gabapentin Rash and Other (See Comments)    Diplopia (double vision)    Family History  Problem Relation Age of Onset  . Stroke Mother   . Diabetes Maternal Grandmother   . Cancer Neg Hx   . Colon cancer Neg Hx   . Esophageal cancer Neg Hx   . Rectal cancer Neg Hx   . Stomach cancer Neg Hx     BP 124/70 (BP Location: Left Arm, Patient Position: Sitting, Cuff Size: Normal)   Pulse 69   Ht 6\' 1"  (1.854 m)   Wt 212 lb (96.2 kg)   SpO2 94%   BMI 27.97 kg/m    Review of Systems He denies hypoglycemia.      Objective:   Physical Exam VITAL SIGNS:  See vs page GENERAL: no distress Pulses: dorsalis pedis intact bilat.   MSK: no deformity of the feet CV: no leg edema Skin:  no ulcer on the feet.  normal color and temp on the feet. Neuro: sensation is intact to touch on the feet.    A1c=7.7%  Lab Results  Component Value Date   CREATININE 0.93 09/01/2018   BUN 15 09/01/2018   NA 139 09/01/2018   K 3.7 09/01/2018   CL 101 09/01/2018   CO2 28 09/01/2018       Assessment & Plan:  Type 2 DM: worse  Patient Instructions  I have sent a prescription to your pharmacy, to increase the repaglinide.  Please continue the same other diabetes medications.   Please come back for a follow-up appointment in 4 months.

## 2019-01-31 NOTE — Telephone Encounter (Signed)
Requested medication (s) are due for refill today: furosemide yes  Requested medication (s) are on the active medication list: yes  Last refill: 11/05/18  Future visit scheduled: yes  Notes to clinic:  Dr Brigitte Pulse  Requested medication (s) are due for refill today: spironolactone yes  Requested medication (s) are on the active medication list: yes  Last refill:  11/05/18  Future visit scheduled: yes  Notes to clinic:  Dr Brigitte Pulse   Requested Prescriptions  Pending Prescriptions Disp Refills   furosemide (LASIX) 40 MG tablet [Pharmacy Med Name: FUROSEMIDE 40 MG TABLET] 90 tablet 1    Sig: TAKE 1 TABLET BY MOUTH EVERY DAY     Cardiovascular:  Diuretics - Loop Passed - 01/29/2019 12:51 AM      Passed - K in normal range and within 360 days    Potassium  Date Value Ref Range Status  09/01/2018 3.7 3.5 - 5.1 mEq/L Final         Passed - Ca in normal range and within 360 days    Calcium  Date Value Ref Range Status  09/01/2018 9.7 8.4 - 10.5 mg/dL Final         Passed - Na in normal range and within 360 days    Sodium  Date Value Ref Range Status  09/01/2018 139 135 - 145 mEq/L Final         Passed - Cr in normal range and within 360 days    Creatinine, Ser  Date Value Ref Range Status  09/01/2018 0.93 0.40 - 1.50 mg/dL Final         Passed - Last BP in normal range    BP Readings from Last 1 Encounters:  01/11/19 132/86         Passed - Valid encounter within last 6 months    Recent Outpatient Visits          2 months ago Essential hypertension   [facility], Laurey Arrow, MD   6 years ago Special screening, prostate cancer   [facility] -Areatha Keas, MD   7 years ago [place], [facility], Hilliard Clark, MD   7 years ago [place], Hilliard Clark, MD      Future Appointments            In 1 week Shawnee Knapp, MD [facility] [place], [facility]           spironolactone (ALDACTONE) 25 MG tablet [Pharmacy Med Name: SPIRONOLACTONE 25 MG TABLET] 90 tablet 1    Sig: TAKE 1 TABLET BY MOUTH EVERY DAY     Cardiovascular: Diuretics - Aldosterone Antagonist Passed - 01/29/2019 12:51 AM      Passed - Cr in normal range and within 360 days    Creatinine, Ser  Date Value Ref Range Status  09/01/2018 0.93 0.40 - 1.50 mg/dL Final         Passed - K in normal range and within 360 days    Potassium  Date Value Ref Range Status  09/01/2018 3.7 3.5 - 5.1 mEq/L Final         Passed - Na in normal range and within 360 days    Sodium  Date Value Ref Range Status  09/01/2018 139 135 - 145 mEq/L Final         Passed - Last BP in normal range  BP Readings from Last 1 Encounters:  01/11/19 132/86         Passed - Valid encounter within last 6 months    Recent Outpatient Visits          2 months ago Essential hypertension   [facility], Laurey Arrow, MD   6 years ago Special screening, prostate cancer   [place], [place]   7 years ago [place] -Areatha Keas, MD   7 years ago [facility] -Areatha Keas, MD      Future Appointments            In 1 week Shawnee Knapp, MD [facility] [place], [facility]

## 2019-01-31 NOTE — Patient Instructions (Addendum)
I have sent a prescription to your pharmacy, to increase the repaglinide.  Please continue the same other diabetes medications.   Please come back for a follow-up appointment in 4 months.

## 2019-02-09 ENCOUNTER — Encounter: Payer: Self-pay | Admitting: Registered Nurse

## 2019-02-09 ENCOUNTER — Other Ambulatory Visit: Payer: Self-pay

## 2019-02-09 ENCOUNTER — Encounter: Payer: Medicare Other | Attending: Physical Medicine & Rehabilitation | Admitting: Registered Nurse

## 2019-02-09 VITALS — BP 157/88 | HR 66 | Ht 73.0 in | Wt 214.4 lb

## 2019-02-09 DIAGNOSIS — G8929 Other chronic pain: Secondary | ICD-10-CM | POA: Diagnosis present

## 2019-02-09 DIAGNOSIS — Z79891 Long term (current) use of opiate analgesic: Secondary | ICD-10-CM

## 2019-02-09 DIAGNOSIS — Z9889 Other specified postprocedural states: Secondary | ICD-10-CM | POA: Diagnosis not present

## 2019-02-09 DIAGNOSIS — E119 Type 2 diabetes mellitus without complications: Secondary | ICD-10-CM | POA: Insufficient documentation

## 2019-02-09 DIAGNOSIS — M25519 Pain in unspecified shoulder: Secondary | ICD-10-CM | POA: Insufficient documentation

## 2019-02-09 DIAGNOSIS — M961 Postlaminectomy syndrome, not elsewhere classified: Secondary | ICD-10-CM

## 2019-02-09 DIAGNOSIS — G894 Chronic pain syndrome: Secondary | ICD-10-CM | POA: Diagnosis not present

## 2019-02-09 DIAGNOSIS — Z5181 Encounter for therapeutic drug level monitoring: Secondary | ICD-10-CM

## 2019-02-09 DIAGNOSIS — M24512 Contracture, left shoulder: Secondary | ICD-10-CM

## 2019-02-09 DIAGNOSIS — M542 Cervicalgia: Secondary | ICD-10-CM | POA: Diagnosis not present

## 2019-02-09 DIAGNOSIS — M5416 Radiculopathy, lumbar region: Secondary | ICD-10-CM | POA: Diagnosis not present

## 2019-02-09 DIAGNOSIS — I10 Essential (primary) hypertension: Secondary | ICD-10-CM | POA: Diagnosis not present

## 2019-02-09 MED ORDER — OXYCODONE-ACETAMINOPHEN 10-325 MG PO TABS: 1.0000 | ORAL_TABLET | Freq: Three times a day (TID) | ORAL | 0 refills | Status: DC | PRN

## 2019-02-09 NOTE — Progress Notes (Signed)
Subjective:    Patient ID: Larry Robertson, male    DOB: [birth year], 78 y.o.   MRN: [id number]  HPI: Larry Robertson is a 78 y.o. male who returns for follow up appointment for chronic pain and medication refill. He states his  pain is located in his neck,  left shoulder, lower back pain  ( describes as deep pain) radiating into his bilateral lower extremities. He rates his  Pain 8. His . current exercise regime is walking and performing stretching exercises.  Mr. Chachere Morphine equivalent is 45.00 MME.Last UDS was on 11/08/2018, see note for details.     Pain Inventory Average Pain 7 Pain Right Now 8 My pain is aching  In the last 24 hours, has pain interfered with the following? General activity 0 Relation with others 0 Enjoyment of life 8 What TIME of day is your pain at its worst?  Sleep (in general) Fair  Pain is worse with: na Pain improves with: medication Relief from Meds: 8  Mobility walk without assistance how many minutes can you walk? 10 ability to climb steps?  yes do you drive?  yes  Function disabled: date disabled .  Neuro/Psych trouble walking  Prior Studies Any changes since last visit?  no  Physicians involved in your care Any changes since last visit?  no   Family History  Problem Relation Age of Onset  . Stroke Mother   . Diabetes Maternal Grandmother   . Cancer Neg Hx   . Colon cancer Neg Hx   . Esophageal cancer Neg Hx   . Rectal cancer Neg Hx   . Stomach cancer Neg Hx    Social History   Socioeconomic History  . Marital status: Single    Spouse name: Not on file  . Number of children: 7  . Years of education: Not on file  . Highest education level: Not on file  Occupational History  . 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 Financial resource strain: Not on file  . Food insecurity:    Worry: Not on file    Inability: Not on file  . Transportation needs:    Medical: Not on file   Non-medical: Not on file  Tobacco Use  . Smoking status: Never Smoker  . Smokeless tobacco: Never Used  Substance and Sexual Activity  . Alcohol use: Yes    Comment: 3-4 beers per week; no alcohol per pt as of 02-09-17 visit  . Drug use: No  . Sexual activity: Not on file  Lifestyle  . Physical activity:    Days per week: Not on file    Minutes per session: Not on file  . Stress: Not on file  Relationships  . Social connections:    Talks on phone: Not on file    Gets together: Not on file    Attends religious service: Not on file    Active member of club or organization: Not on file    Attends meetings of clubs or organizations: Not on file    Relationship status: Not on file  Other Topics Concern  . Not on file  Social History Narrative   Divorced   Past Surgical History:  Procedure Laterality Date  . ABDOMINAL HERNIA REPAIR  2000  . ANTERIOR CERVICAL DECOMP/DISCECTOMY FUSION Right 03/04/2018   Procedure: Cervical Three-Four, Cervical Six-Seven Right Side approach, Anterior cervical decompression/discectomy/fusion;  Surgeon: Erline Levine, MD;  Location: [facility];  Service: Neurosurgery;  Laterality: Right;  right side anterior approach  . CHOLECYSTECTOMY  1999  . COLONOSCOPY    . LUMBAR FUSION  2005  . TONSILLECTOMY AND ADENOIDECTOMY    . UPPER GASTROINTESTINAL ENDOSCOPY     Past Medical History:  Diagnosis Date  . Arthritis   . Blood transfusion without reported diagnosis   . Colon polyps 05/19/2012  . 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 DIABETES MELLITUS, TYPE II 08/25/2007  . Diastolic dysfunction    on echo  . Diverticulosis   . ERECTILE DYSFUNCTION 08/25/2007  . 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 Heart murmur    but was not stated by DR Hilty  . Hepatitis    took treatment  . HEPATITIS C 08/25/2007  . HYPERTENSION 08/25/2007  . 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 HYPOGONADISM 09/27/2009   BP (!) 157/88   Pulse 66   Ht 6\' 1"   (1.854 m)   Wt 214 lb 6.4 oz (97.3 kg)   SpO2 98%   BMI 28.29 kg/m   Opioid Risk Score:   Fall Risk Score:  `1  Depression screen PHQ 2/9  Depression screen [facility] 2/9 02/09/2019 01/11/2019 11/08/2018 11/05/2018 10/18/2018  Decreased Interest 0 0 0 0 0  Down, Depressed, Hopeless 0 0 0 0 0  PHQ - 2 Score 0 0 0 0 0  Altered sleeping - - - - 0  Tired, decreased energy - - - - 2  Change in appetite - - - - 0  Feeling bad or failure about yourself  - - - - 0  Trouble concentrating - - - - 0  Moving slowly or fidgety/restless - - - - 0  Suicidal thoughts - - - - 0  PHQ-9 Score - - - - 2    Review of Systems  Constitutional: Negative.   HENT: Negative.   Eyes: Negative.   Respiratory: Negative.   Cardiovascular: Negative.   Gastrointestinal: Negative.   Endocrine: Negative.   Genitourinary: Negative.   Musculoskeletal: Positive for gait problem.  Skin: Negative.   Allergic/Immunologic: Negative.   Hematological: Negative.   Psychiatric/Behavioral: Negative.   All other systems reviewed and are negative.      Objective:   Physical Exam Vitals signs and nursing note reviewed.  Constitutional:      Appearance: Normal appearance.  Neck:     Musculoskeletal: Normal range of motion and neck supple.  Cardiovascular:     Rate and Rhythm: Normal rate and regular rhythm.     Pulses: Normal pulses.     Heart sounds: Normal heart sounds.  Pulmonary:     Effort: Pulmonary effort is normal.     Breath sounds: Normal breath sounds.  Musculoskeletal:     Comments: Normal Muscle Bulk and Muscle Testing Reveals:  Upper Extremities:Right: Full  ROM and Muscle Strength 5/5 Left: Decreased ROM 45 Degrees and Muscle Strength 4/5  Left AC Joint Tenderness Back without spinal tenderness Lower Extremities: Full ROM and Muscle Strength 5/5 Arises from chair slowly Narrow Based Gait   Skin:    General: Skin is warm and dry.  Neurological:     Mental Status: He is alert and oriented to  person, place, and time.  Psychiatric:        Mood and Affect: Mood normal.        Behavior: Behavior normal.           Assessment & Plan:  1. Cervical Postlaminectomy Syndrome: Continue HEP as Tolerated. Continue to  Monitor.02/09/2019. 2. Contracture of Left Shoulder Joint Region: Continue HEP as Tolerated.02/09/2019. 3. Chronic Low Back Pain: Continue HEP as Tolerated. Continue to Monitor.02/09/2019 4. Chronic Pain Syndrome:Refilled:Oxycodone 10/325 mg one tablet three times a day as needed for pain #90.02/09/2019 We will continue the opioid monitoring program, this consists of regular clinic visits, examinations, urine drug screen, pill counts as well as use of [place] Controlled Substance Reporting system.  17minutes of face to face patient care time was spent during this visit. All questions were encouraged and answered.  F/U in 1 month

## 2019-02-10 ENCOUNTER — Ambulatory Visit: Payer: Self-pay

## 2019-02-10 ENCOUNTER — Ambulatory Visit: Payer: Self-pay | Admitting: Family Medicine

## 2019-02-26 ENCOUNTER — Other Ambulatory Visit: Payer: Self-pay | Admitting: Family Medicine

## 2019-03-03 ENCOUNTER — Encounter: Payer: Self-pay | Admitting: Family Medicine

## 2019-03-03 ENCOUNTER — Ambulatory Visit (INDEPENDENT_AMBULATORY_CARE_PROVIDER_SITE_OTHER): Payer: Medicare Other | Admitting: Family Medicine

## 2019-03-03 VITALS — BP 153/90 | HR 74 | Ht 73.0 in

## 2019-03-03 DIAGNOSIS — M109 Gout, unspecified: Secondary | ICD-10-CM | POA: Diagnosis not present

## 2019-03-03 MED ORDER — INDOMETHACIN 50 MG PO CAPS: 50.0000 mg | ORAL_CAPSULE | Freq: Two times a day (BID) | ORAL | 1 refills | Status: AC

## 2019-03-03 NOTE — Progress Notes (Addendum)
Virtual Visit via Video Note  I connected with Larry Robertson on 03/03/19 at  9:30 AM EDT by a video enabled telemedicine application and verified that I am speaking with the correct person using two identifiers.   I discussed the limitations of evaluation and management by telemedicine and the availability of in person appointments. The patient expressed understanding and agreed to proceed.  Interactive video and audio telecommunications were attempted between myself and the patient. However they failed due to the patient having technical difficulties or not having access to video capability. We continued and completed with audio only.  History of Present Illness:one week ho bilateral knee pain with some swelling. Thinks it may be his gout. Has been taking colchicine.     Observations/Objective:nad and alert and oriented.   Assessment and Plan:Knee pain possible gout. 1. Gouty arthropathy - indomethacin (INDOCIN) 50 MG capsule; Take 1 capsule (50 mg total) by mouth 2 (two) times daily with a meal for 7 days.  Dispense: 14 capsule; Refill: 1  Follow Up Instructions:    I discussed the assessment and treatment plan with the patient. The patient was provided an opportunity to ask questions and all were answered. The patient agreed with the plan and demonstrated an understanding of the instructions.   The patient was advised to call back or seek an in-person evaluation if the symptoms worsen or if the condition fails to improve as anticipated.  I provided 15 minutes of non-face-to-face time during this encounter.   Will try to bring patient in for bp follow up.

## 2019-03-09 ENCOUNTER — Encounter: Payer: Medicare Other | Attending: Physical Medicine & Rehabilitation | Admitting: Registered Nurse

## 2019-03-09 ENCOUNTER — Other Ambulatory Visit: Payer: Self-pay

## 2019-03-09 VITALS — BP 159/84 | HR 76 | Temp 98.2°F | Ht 73.0 in | Wt 218.4 lb

## 2019-03-09 DIAGNOSIS — M24512 Contracture, left shoulder: Secondary | ICD-10-CM | POA: Diagnosis not present

## 2019-03-09 DIAGNOSIS — E119 Type 2 diabetes mellitus without complications: Secondary | ICD-10-CM | POA: Insufficient documentation

## 2019-03-09 DIAGNOSIS — G8929 Other chronic pain: Secondary | ICD-10-CM | POA: Diagnosis present

## 2019-03-09 DIAGNOSIS — M5416 Radiculopathy, lumbar region: Secondary | ICD-10-CM

## 2019-03-09 DIAGNOSIS — M542 Cervicalgia: Secondary | ICD-10-CM | POA: Diagnosis not present

## 2019-03-09 DIAGNOSIS — Z9889 Other specified postprocedural states: Secondary | ICD-10-CM | POA: Insufficient documentation

## 2019-03-09 DIAGNOSIS — I10 Essential (primary) hypertension: Secondary | ICD-10-CM | POA: Insufficient documentation

## 2019-03-09 DIAGNOSIS — G894 Chronic pain syndrome: Secondary | ICD-10-CM

## 2019-03-09 DIAGNOSIS — M961 Postlaminectomy syndrome, not elsewhere classified: Secondary | ICD-10-CM | POA: Diagnosis not present

## 2019-03-09 DIAGNOSIS — M25519 Pain in unspecified shoulder: Secondary | ICD-10-CM | POA: Insufficient documentation

## 2019-03-09 DIAGNOSIS — Z79891 Long term (current) use of opiate analgesic: Secondary | ICD-10-CM

## 2019-03-09 DIAGNOSIS — Z5181 Encounter for therapeutic drug level monitoring: Secondary | ICD-10-CM

## 2019-03-09 MED ORDER — OXYCODONE-ACETAMINOPHEN 10-325 MG PO TABS: 1.0000 | ORAL_TABLET | Freq: Three times a day (TID) | ORAL | 0 refills | Status: DC | PRN

## 2019-03-09 NOTE — Progress Notes (Signed)
Subjective:    Patient ID: Larry Robertson, male    DOB: Oct 20, 1941, 78 y.o.   MRN: [id number]  HPI: Larry Robertson is a 78 y.o. male who returns for follow up appointment for chronic pain and medication refill. He states his pain is located in his neck, left shoulder, lower back pain radiating into his bilateral lower extremities and bilateral knee pain. He rates his pain 9. His current exercise regime is walking.  Larry Robertson Morphine equivalent is 45.00  MME.  Last UDS was Performed on 11/08/2018, see note for details.   Pain Inventory Average Pain 7 Pain Right Now 9 My pain is aching  In the last 24 hours, has pain interfered with the following? General activity 7 Relation with others 6 Enjoyment of life 4 What TIME of day is your pain at its worst? all Sleep (in general) Fair  Pain is worse with: na Pain improves with: medication Relief from Meds: 7  Mobility how many minutes can you walk? 10 ability to climb steps?  yes do you drive?  yes  Function retired  Neuro/Psych trouble walking  Prior Studies Any changes since last visit?  no  Physicians involved in your care Any changes since last visit?  no   Family History  Problem Relation Age of Onset  . Stroke Mother   . Diabetes Maternal Grandmother   . Cancer Neg Hx   . Colon cancer Neg Hx   . Esophageal cancer Neg Hx   . Rectal cancer Neg Hx   . Stomach cancer Neg Hx    Social History   Socioeconomic History  . Marital status: Single    Spouse name: Not on file  . Number of children: 7  . Years of education: Not on file  . Highest education level: Not on file  Occupational History  . 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 Financial resource strain: Not on file  . Food insecurity:    Worry: Not on file    Inability: Not on file  . Transportation needs:    Medical: Not on file    Non-medical: Not on file  Tobacco Use  . Smoking status: Never Smoker  .  Smokeless tobacco: Never Used  Substance and Sexual Activity  . Alcohol use: Yes    Comment: 3-4 beers per week; no alcohol per pt as of 02-09-17 visit  . Drug use: No  . Sexual activity: Not on file  Lifestyle  . Physical activity:    Days per week: Not on file    Minutes per session: Not on file  . Stress: Not on file  Relationships  . Social connections:    Talks on phone: Not on file    Gets together: Not on file    Attends religious service: Not on file    Active member of club or organization: Not on file    Attends meetings of clubs or organizations: Not on file    Relationship status: Not on file  Other Topics Concern  . Not on file  Social History Narrative   Divorced   Past Surgical History:  Procedure Laterality Date  . ABDOMINAL HERNIA REPAIR  2000  . ANTERIOR CERVICAL DECOMP/DISCECTOMY FUSION Right 03/04/2018   Procedure: Cervical Three-Four, Cervical Six-Seven Right Side approach, Anterior cervical decompression/discectomy/fusion;  Surgeon: Erline Levine, MD;  Location: [facility];  Service: Neurosurgery;  Laterality: Right;  right side anterior approach  . CHOLECYSTECTOMY  1999  . COLONOSCOPY    . LUMBAR FUSION  2005  . TONSILLECTOMY AND ADENOIDECTOMY    . UPPER GASTROINTESTINAL ENDOSCOPY     Past Medical History:  Diagnosis Date  . Arthritis   . Blood transfusion without reported diagnosis   . Colon polyps 05/19/2012  . 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 DIABETES MELLITUS, TYPE II 08/25/2007  . Diastolic dysfunction    on echo  . Diverticulosis   . ERECTILE DYSFUNCTION 08/25/2007  . 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 Heart murmur    but was not stated by DR Hilty  . Hepatitis    took treatment  . HEPATITIS C 08/25/2007  . HYPERTENSION 08/25/2007  . 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 HYPOGONADISM 09/27/2009   BP (!) 159/84   Pulse 76   Temp 98.2 F (36.8 C)   Ht 6\' 1"  (1.854 m)   Wt 218 lb 6.4 oz (99.1 kg)   SpO2 93%    BMI 28.81 kg/m   Opioid Risk Score:   Fall Risk Score:  `1  Depression screen PHQ 2/9  Depression screen [facility] 2/9 03/09/2019 02/09/2019 01/11/2019 11/08/2018 11/05/2018 10/18/2018  Decreased Interest 0 0 0 0 0 0  Down, Depressed, Hopeless - 0 0 0 0 0  PHQ - 2 Score 0 0 0 0 0 0  Altered sleeping - - - - - 0  Tired, decreased energy - - - - - 2  Change in appetite - - - - - 0  Feeling bad or failure about yourself  - - - - - 0  Trouble concentrating - - - - - 0  Moving slowly or fidgety/restless - - - - - 0  Suicidal thoughts - - - - - 0  PHQ-9 Score - - - - - 2    Review of Systems  Constitutional: Negative.   HENT: Negative.   Eyes: Negative.   Respiratory: Negative.   Cardiovascular: Negative.   Gastrointestinal: Negative.   Endocrine: Negative.   Genitourinary: Negative.   Musculoskeletal: Negative.   Skin: Negative.   Allergic/Immunologic: Negative.   Neurological: Negative.   Hematological: Negative.   Psychiatric/Behavioral: Negative.   All other systems reviewed and are negative.      Objective:   Physical Exam Vitals signs and nursing note reviewed.  Constitutional:      Appearance: Normal appearance.  Neck:     Musculoskeletal: Normal range of motion and neck supple.  Cardiovascular:     Rate and Rhythm: Normal rate and regular rhythm.     Pulses: Normal pulses.     Heart sounds: Normal heart sounds.  Pulmonary:     Effort: Pulmonary effort is normal.     Breath sounds: Normal breath sounds.  Musculoskeletal:     Comments: Normal Muscle Bulk and Muscle Testing Reveals:  Upper Extremities: Right: Full ROM and Muscle Strength 5/5 Left: Decreased ROM 30 Degrees and Muscle Strength 4/5 Left AC Joint Tenderness Lumbar Paraspinal Tenderness: L-4-l-5 Lower Extremities: Decreased ROM and Muscle Strength 5/5 Bilateral Lower Extremities Flexion Produces Pain into Bilateral Knees Arises from Table slowly  Antalgic Gait   Skin:    General: Skin is warm and dry.   Neurological:     Mental Status: He is alert and oriented to person, place, and time.  Psychiatric:        Mood and Affect: Mood normal.        Behavior: Behavior normal.  Assessment & Plan:  1. Cervical Postlaminectomy Syndrome/ Cervicalgia: Continue HEP as Tolerated. Continue to Monitor.03/09/2019. 2. Contracture of Left Shoulder Joint Region: Continue HEP as Tolerated.03/09/2019. 3. Chronic Low Back Pain/ Lumbar Radiculitis: Continue HEP as Tolerated. Continue to Monitor.03/09/2019 4. Chronic Pain Syndrome:Refilled:Oxycodone 10/325 mg one tablet three times a day as needed for pain #90.03/09/2019 We will continue the opioid monitoring program, this consists of regular clinic visits, examinations, urine drug screen, pill counts as well as use of [place] Controlled Substance Reporting system.  69minutes of face to face patient care time was spent during this visit. All questions were encouraged and answered.  F/U in 1 month

## 2019-03-13 ENCOUNTER — Encounter: Payer: Self-pay | Admitting: Registered Nurse

## 2019-03-15 ENCOUNTER — Telehealth: Payer: Self-pay | Admitting: Family Medicine

## 2019-03-15 NOTE — Telephone Encounter (Signed)
Patient called to make an appointment but wanted to know the earliest he could come in the office for a check up or bp check, He was talking about some things him and Dr Ethelene Hal discussed and I wasn't sure if he wanted to do a face to face or schedule him out in july

## 2019-03-15 NOTE — Telephone Encounter (Signed)
Liane Comber, As soon as we are seeing patients regularly in the clinic which I hope will be before July.

## 2019-03-16 NOTE — Telephone Encounter (Signed)
Ok. I will let him know.

## 2019-03-22 ENCOUNTER — Other Ambulatory Visit: Payer: Self-pay | Admitting: Family Medicine

## 2019-03-22 NOTE — Telephone Encounter (Signed)
Requested medication (s) are due for refill today: Yes  Requested medication (s) are on the active medication list: Yes  Last refill:  02/06/19  Future visit scheduled: Yes  Notes to clinic:  Unable to refill, last refilled by another provider.     Requested Prescriptions  Pending Prescriptions Disp Refills   furosemide (LASIX) 40 MG tablet [Pharmacy Med Name: FUROSEMIDE 40 MG TABLET] 30 tablet 0    Sig: TAKE 1 TABLET BY MOUTH EVERY DAY     Cardiovascular:  Diuretics - Loop Failed - 03/22/2019 12:34 PM      Failed - Last BP in normal range    BP Readings from Last 1 Encounters:  03/09/19 (!) 159/84         Passed - K in normal range and within 360 days    Potassium  Date Value Ref Range Status  09/01/2018 3.7 3.5 - 5.1 mEq/L Final         Passed - Ca in normal range and within 360 days    Calcium  Date Value Ref Range Status  09/01/2018 9.7 8.4 - 10.5 mg/dL Final         Passed - Na in normal range and within 360 days    Sodium  Date Value Ref Range Status  09/01/2018 139 135 - 145 mEq/L Final         Passed - Cr in normal range and within 360 days    Creatinine, Ser  Date Value Ref Range Status  09/01/2018 0.93 0.40 - 1.50 mg/dL Final         Passed - Valid encounter within last 6 months    Recent Outpatient Visits          2 weeks ago Gouty arthropathy   [facility], Mortimer Fries, MD   4 months ago Essential hypertension   [facility], Laurey Arrow, MD   6 years ago Special screening, prostate cancer   [facility] -Areatha Keas, MD   7 years ago [place] -Are[facility] Keas, MD   7 years ago [facility] -Areatha Keas, MD      Future Appointments            In 2 months Libby Maw, MD [facility]          spironolactone (ALDACTONE) 25 MG tablet [Pharmacy  Med Name: SPIRONOLACTONE 25 MG TABLET] 30 tablet 0    Sig: TAKE 1 TABLET BY MOUTH EVERY DAY     Cardiovascular: Diuretics - Aldosterone Antagonist Failed - 03/22/2019 12:34 PM      Failed - Last BP in normal range    BP Readings from Last 1 Encounters:  03/09/19 (!) 159/84         Passed - Cr in normal range and within 360 days    Creatinine, Ser  Date Value Ref Range Status  09/01/2018 0.93 0.40 - 1.50 mg/dL Final         Passed - K in normal range and within 360 days    Potassium  Date Value Ref Range Status  09/01/2018 3.7 3.5 - 5.1 mEq/L Final         Passed - Na in normal range and within 360 days    Sodium  Date Value Ref Range Status  09/01/2018 139 135 - 145 mEq/L Final  Passed - Valid encounter within last 6 months    Recent Outpatient Visits          2 weeks ago Gouty arthropathy   [facility], Mortimer Fries, MD   4 months ago Essential hypertension   [facility], Laurey Arrow, MD   6 years ago Special screening, prostate cancer   [facility] -Are[facility] Keas, MD   7 years ago [place] -Are[facility] Ke[facility], MD   7 years ago [facility] -Are[facility] Keas, MD      Future Appointments            In 2 months Ethelene Hal, Mortimer Fries, MD [facility]

## 2019-03-28 ENCOUNTER — Other Ambulatory Visit: Payer: Self-pay | Admitting: Endocrinology

## 2019-04-11 ENCOUNTER — Ambulatory Visit: Payer: Medicare Other | Admitting: Registered Nurse

## 2019-04-12 ENCOUNTER — Other Ambulatory Visit: Payer: Self-pay

## 2019-04-12 ENCOUNTER — Encounter: Payer: Medicare Other | Attending: Physical Medicine & Rehabilitation | Admitting: Registered Nurse

## 2019-04-12 ENCOUNTER — Encounter: Payer: Self-pay | Admitting: Registered Nurse

## 2019-04-12 VITALS — BP 139/84 | Temp 98.5°F | Ht 73.0 in | Wt 214.0 lb

## 2019-04-12 DIAGNOSIS — M542 Cervicalgia: Secondary | ICD-10-CM | POA: Diagnosis not present

## 2019-04-12 DIAGNOSIS — M24512 Contracture, left shoulder: Secondary | ICD-10-CM

## 2019-04-12 DIAGNOSIS — M961 Postlaminectomy syndrome, not elsewhere classified: Secondary | ICD-10-CM | POA: Diagnosis not present

## 2019-04-12 DIAGNOSIS — E119 Type 2 diabetes mellitus without complications: Secondary | ICD-10-CM | POA: Insufficient documentation

## 2019-04-12 DIAGNOSIS — Z79891 Long term (current) use of opiate analgesic: Secondary | ICD-10-CM

## 2019-04-12 DIAGNOSIS — G8929 Other chronic pain: Secondary | ICD-10-CM | POA: Insufficient documentation

## 2019-04-12 DIAGNOSIS — I10 Essential (primary) hypertension: Secondary | ICD-10-CM | POA: Insufficient documentation

## 2019-04-12 DIAGNOSIS — Z5181 Encounter for therapeutic drug level monitoring: Secondary | ICD-10-CM

## 2019-04-12 DIAGNOSIS — Z9889 Other specified postprocedural states: Secondary | ICD-10-CM | POA: Insufficient documentation

## 2019-04-12 DIAGNOSIS — M5416 Radiculopathy, lumbar region: Secondary | ICD-10-CM

## 2019-04-12 DIAGNOSIS — M25519 Pain in unspecified shoulder: Secondary | ICD-10-CM | POA: Insufficient documentation

## 2019-04-12 DIAGNOSIS — G894 Chronic pain syndrome: Secondary | ICD-10-CM

## 2019-04-12 MED ORDER — OXYCODONE-ACETAMINOPHEN 10-325 MG PO TABS: 1.0000 | ORAL_TABLET | Freq: Three times a day (TID) | ORAL | 0 refills | Status: DC | PRN

## 2019-04-12 NOTE — Progress Notes (Signed)
Subjective:    Patient ID: Larry Robertson, male    DOB: July 10, 1941, 78 y.o.   MRN: [id number]  HPI: Larry Robertson is a 78 y.o. male his appointment was changed, due to national recommendations of social distancing due to [place] 19, an audio/video telehealth visit is felt to be most appropriate for this patient at this time.  See Chart message from today for the patient's consent to telehealth from [place].     He states his pain is located in his left shoulder and lower back pain radiating into his bilateral lower extremities. He rates his pain 8. His current exercise regime is walking.  Mr. Eckard Morphine equivalent is 45.00 MME.  Last UDS was Performed on 11/08/2018, see note for details.   Marland Mcalpine CMA asked the Health and History Questions. This provider and Marland Mcalpine verified we were  speaking with the correct person using two identifiers.   Pain Inventory Average Pain 8 Pain Right Now 8 My pain is constant, dull and aching  In the last 24 hours, has pain interfered with the following? General activity 8 Relation with others 8 Enjoyment of life 8 What TIME of day is your pain at its worst? all Sleep (in general) Poor  Pain is worse with: walking, standing and some activites Pain improves with: medication Relief from Meds: 8  Mobility walk without assistance ability to climb steps?  yes do you drive?  yes  Function retired  Neuro/Psych trouble walking  Prior Studies Any changes since last visit?  no  Physicians involved in your care Any changes since last visit?  no   Family History  Problem Relation Age of Onset  . Stroke Mother   . Diabetes Maternal Grandmother   . Cancer Neg Hx   . Colon cancer Neg Hx   . Esophageal cancer Neg Hx   . Rectal cancer Neg Hx   . Stomach cancer Neg Hx    Social History   Socioeconomic History  . Marital status: Single    Spouse name: Not on file  . Number of children: 7   . Years of education: Not on file  . Highest education level: Not on file  Occupational History  . 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 Financial resource strain: Not on file  . Food insecurity:    Worry: Not on file    Inability: Not on file  . Transportation needs:    Medical: Not on file    Non-medical: Not on file  Tobacco Use  . Smoking status: Never Smoker  . Smokeless tobacco: Never Used  Substance and Sexual Activity  . Alcohol use: Yes    Comment: 3-4 beers per week; no alcohol per pt as of 02-09-17 visit  . Drug use: No  . Sexual activity: Not on file  Lifestyle  . Physical activity:    Days per week: Not on file    Minutes per session: Not on file  . Stress: Not on file  Relationships  . Social connections:    Talks on phone: Not on file    Gets together: Not on file    Attends religious service: Not on file    Active member of club or organization: Not on file    Attends meetings of clubs or organizations: Not on file    Relationship status: Not on file  Other Topics Concern  . Not on file  Social History Narrative   Divorced   Past Surgical History:  Procedure Laterality Date  . ABDOMINAL HERNIA REPAIR  2000  . ANTERIOR CERVICAL DECOMP/DISCECTOMY FUSION Right 03/04/2018   Procedure: Cervical Three-Four, Cervical Six-Seven Right Side approach, Anterior cervical decompression/discectomy/fusion;  Surgeon: Erline Levine, MD;  Location: [facility];  Service: Neurosurgery;  Laterality: Right;  right side anterior approach  . CHOLECYSTECTOMY  1999  . COLONOSCOPY    . LUMBAR FUSION  2005  . TONSILLECTOMY AND ADENOIDECTOMY    . UPPER GASTROINTESTINAL ENDOSCOPY     Past Medical History:  Diagnosis Date  . Arthritis   . Blood transfusion without reported diagnosis   . Colon polyps 05/19/2012  . 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 DIABETES MELLITUS, TYPE II 08/25/2007  . Diastolic dysfunction    on echo  .  Diverticulosis   . ERECTILE DYSFUNCTION 08/25/2007  . 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 Heart murmur    but was not stated by DR Hilty  . Hepatitis    took treatment  . HEPATITIS C 08/25/2007  . HYPERTENSION 08/25/2007  . 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 HYPOGONADISM 09/27/2009   There were no vitals taken for this visit.  Opioid Risk Score:   Fall Risk Score:  `1  Depression screen PHQ 2/9  Depression screen [facility] 2/9 03/09/2019 02/09/2019 01/11/2019 11/08/2018 11/05/2018 10/18/2018  Decreased Interest 0 0 0 0 0 0  Down, Depressed, Hopeless - 0 0 0 0 0  PHQ - 2 Score 0 0 0 0 0 0  Altered sleeping - - - - - 0  Tired, decreased energy - - - - - 2  Change in appetite - - - - - 0  Feeling bad or failure about yourself  - - - - - 0  Trouble concentrating - - - - - 0  Moving slowly or fidgety/restless - - - - - 0  Suicidal thoughts - - - - - 0  PHQ-9 Score - - - - - 2     Review of Systems  Constitutional: Negative.   HENT: Negative.   Eyes: Negative.   Respiratory: Negative.   Cardiovascular: Negative.   Gastrointestinal: Negative.   Endocrine: Negative.   Genitourinary: Negative.   Musculoskeletal: Positive for arthralgias, back pain and myalgias.  Skin: Negative.   Allergic/Immunologic: Negative.   Neurological: Negative.   Hematological: Negative.   Psychiatric/Behavioral: Negative.   All other systems reviewed and are negative.      Objective:   Physical Exam Vitals signs and nursing note reviewed.  Musculoskeletal:     Comments: No Physical Exam Performed: Virtual Visit  Neurological:     Mental Status: He is oriented to person, place, and time.           Assessment & Plan:  . Cervical Postlaminectomy Syndrome/ Cervicalgia: Continue HEP as Tolerated. Continue to Monitor.04/12/2019. 2. Contracture of Left Shoulder Joint Region: Continue HEP as Tolerated.04/12/2019. 3. Chronic Low Back Pain/ Lumbar Radiculitis: Continue HEP as  Tolerated. Continue to Monitor.04/12/2019 4. Chronic Pain Syndrome:Refilled:Oxycodone 10/325 mg one tablet three times a day as needed for pain #90.04/12/2019 We will continue the opioid monitoring program, this consists of regular clinic visits, examinations, urine drug screen, pill counts as well as use of [place] Controlled Substance Reporting system.  F/U in 1 month  Telephone Call  Location of patient: In his Home Location of provider: Office Established patient Time spent on call:  10 Minutes

## 2019-04-15 ENCOUNTER — Other Ambulatory Visit: Payer: Self-pay | Admitting: Family Medicine

## 2019-04-15 DIAGNOSIS — I10 Essential (primary) hypertension: Secondary | ICD-10-CM

## 2019-04-15 NOTE — Telephone Encounter (Signed)
Dr. Ethelene Hal please advise is refill appropriate?

## 2019-05-08 ENCOUNTER — Other Ambulatory Visit: Payer: Self-pay | Admitting: Family Medicine

## 2019-05-08 DIAGNOSIS — I10 Essential (primary) hypertension: Secondary | ICD-10-CM

## 2019-05-09 ENCOUNTER — Encounter: Payer: Medicare Other | Attending: Physical Medicine & Rehabilitation | Admitting: Registered Nurse

## 2019-05-09 ENCOUNTER — Encounter: Payer: Self-pay | Admitting: Registered Nurse

## 2019-05-09 ENCOUNTER — Other Ambulatory Visit: Payer: Self-pay

## 2019-05-09 ENCOUNTER — Other Ambulatory Visit: Payer: Self-pay | Admitting: Registered Nurse

## 2019-05-09 VITALS — BP 132/76 | HR 69 | Ht 73.0 in | Wt 220.0 lb

## 2019-05-09 DIAGNOSIS — G8929 Other chronic pain: Secondary | ICD-10-CM | POA: Diagnosis present

## 2019-05-09 DIAGNOSIS — M24512 Contracture, left shoulder: Secondary | ICD-10-CM

## 2019-05-09 DIAGNOSIS — M25519 Pain in unspecified shoulder: Secondary | ICD-10-CM | POA: Diagnosis not present

## 2019-05-09 DIAGNOSIS — G894 Chronic pain syndrome: Secondary | ICD-10-CM

## 2019-05-09 DIAGNOSIS — Z5181 Encounter for therapeutic drug level monitoring: Secondary | ICD-10-CM

## 2019-05-09 DIAGNOSIS — M961 Postlaminectomy syndrome, not elsewhere classified: Secondary | ICD-10-CM

## 2019-05-09 DIAGNOSIS — M542 Cervicalgia: Secondary | ICD-10-CM

## 2019-05-09 DIAGNOSIS — I10 Essential (primary) hypertension: Secondary | ICD-10-CM | POA: Insufficient documentation

## 2019-05-09 DIAGNOSIS — Z9889 Other specified postprocedural states: Secondary | ICD-10-CM | POA: Diagnosis not present

## 2019-05-09 DIAGNOSIS — Z79891 Long term (current) use of opiate analgesic: Secondary | ICD-10-CM | POA: Diagnosis not present

## 2019-05-09 DIAGNOSIS — M5416 Radiculopathy, lumbar region: Secondary | ICD-10-CM

## 2019-05-09 DIAGNOSIS — E119 Type 2 diabetes mellitus without complications: Secondary | ICD-10-CM | POA: Diagnosis not present

## 2019-05-09 MED ORDER — OXYCODONE-ACETAMINOPHEN 10-325 MG PO TABS: 1.0000 | ORAL_TABLET | Freq: Three times a day (TID) | ORAL | 0 refills | Status: DC | PRN

## 2019-05-09 NOTE — Progress Notes (Signed)
Subjective:    Patient ID: Larry Robertson, male    DOB: 1941/02/01, 78 y.o.   MRN: [id number]  HPI: Larry Robertson is a 78 y.o. male who returns for follow up appointment for chronic pain and medication refill. He states his  pain is located in his neck radiating into his left shoulder and lower back pain radiating into his bilateral lower extremities. He  rates his  Pain 8. His current exercise regime is walking.   Mr. Villagran Morphine equivalent is 45.00  MME.   UDS ordered today.    Pain Inventory Average Pain 7 Pain Right Now 8 My pain is constant and aching  In the last 24 hours, has pain interfered with the following? General activity 7 Relation with others 0 Enjoyment of life 8 What TIME of day is your pain at its worst? all Sleep (in general) Fair  Pain is worse with: walking, bending, sitting, inactivity, standing and some activites Pain improves with: medication Relief from Meds: 8  Mobility walk without assistance how many minutes can you walk? 10 ability to climb steps?  yes do you drive?  yes  Function not employed: date last employed .  Neuro/Psych trouble walking  Prior Studies Any changes since last visit?  no  Physicians involved in your care Any changes since last visit?  no   Family History  Problem Relation Age of Onset  . Stroke Mother   . Diabetes Maternal Grandmother   . Cancer Neg Hx   . Colon cancer Neg Hx   . Esophageal cancer Neg Hx   . Rectal cancer Neg Hx   . Stomach cancer Neg Hx    Social History   Socioeconomic History  . Marital status: Single    Spouse name: Not on file  . Number of children: 7  . Years of education: Not on file  . Highest education level: Not on file  Occupational History  . 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 Financial resource strain: Not on file  . Food insecurity:    Worry: Not on file    Inability: Not on file  . Transportation needs:    Medical: Not  on file    Non-medical: Not on file  Tobacco Use  . Smoking status: Never Smoker  . Smokeless tobacco: Never Used  Substance and Sexual Activity  . Alcohol use: Yes    Comment: 3-4 beers per week; no alcohol per pt as of 02-09-17 visit  . Drug use: No  . Sexual activity: Not on file  Lifestyle  . Physical activity:    Days per week: Not on file    Minutes per session: Not on file  . Stress: Not on file  Relationships  . Social connections:    Talks on phone: Not on file    Gets together: Not on file    Attends religious service: Not on file    Active member of club or organization: Not on file    Attends meetings of clubs or organizations: Not on file    Relationship status: Not on file  Other Topics Concern  . Not on file  Social History Narrative   Divorced   Past Surgical History:  Procedure Laterality Date  . ABDOMINAL HERNIA REPAIR  2000  . ANTERIOR CERVICAL DECOMP/DISCECTOMY FUSION Right 03/04/2018   Procedure: Cervical Three-Four, Cervical Six-Seven Right Side approach, Anterior cervical decompression/discectomy/fusion;  Surgeon: Erline Levine, MD;  Location: [facility]  OR;  Service: Neurosurgery;  Laterality: Right;  right side anterior approach  . CHOLECYSTECTOMY  1999  . COLONOSCOPY    . LUMBAR FUSION  2005  . TONSILLECTOMY AND ADENOIDECTOMY    . UPPER GASTROINTESTINAL ENDOSCOPY     Past Medical History:  Diagnosis Date  . Arthritis   . Blood transfusion without reported diagnosis   . Colon polyps 05/19/2012  . 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 DIABETES MELLITUS, TYPE II 08/25/2007  . Diastolic dysfunction    on echo  . Diverticulosis   . ERECTILE DYSFUNCTION 08/25/2007  . 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 Heart murmur    but was not stated by DR Hilty  . Hepatitis    took treatment  . HEPATITIS C 08/25/2007  . HYPERTENSION 08/25/2007  . 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 HYPOGONADISM 09/27/2009   BP 132/76   Pulse 69   Ht 6'  1" (1.854 m)   Wt 220 lb (99.8 kg)   SpO2 95%   BMI 29.03 kg/m   Opioid Risk Score:   Fall Risk Score:  `1  Depression screen PHQ 2/9  Depression screen [facility] 2/9 03/09/2019 02/09/2019 01/11/2019 11/08/2018 11/05/2018 10/18/2018  Decreased Interest 0 0 0 0 0 0  Down, Depressed, Hopeless - 0 0 0 0 0  PHQ - 2 Score 0 0 0 0 0 0  Altered sleeping - - - - - 0  Tired, decreased energy - - - - - 2  Change in appetite - - - - - 0  Feeling bad or failure about yourself  - - - - - 0  Trouble concentrating - - - - - 0  Moving slowly or fidgety/restless - - - - - 0  Suicidal thoughts - - - - - 0  PHQ-9 Score - - - - - 2    Review of Systems  Constitutional: Negative.   HENT: Negative.   Eyes: Negative.   Respiratory: Negative.   Cardiovascular: Negative.   Gastrointestinal: Negative.   Endocrine: Negative.        High blood sugar  Genitourinary: Negative.   Musculoskeletal: Positive for arthralgias, back pain and gait problem.  Allergic/Immunologic: Negative.   Hematological: Negative.   Psychiatric/Behavioral: Negative.        Objective:   Physical Exam Vitals signs and nursing note reviewed.  Constitutional:      Appearance: Normal appearance.  Neck:     Musculoskeletal: Normal range of motion and neck supple.  Cardiovascular:     Rate and Rhythm: Normal rate and regular rhythm.     Pulses: Normal pulses.     Heart sounds: Normal heart sounds.  Pulmonary:     Effort: Pulmonary effort is normal.     Breath sounds: Normal breath sounds.  Musculoskeletal:     Comments: Normal Muscle Bulk and Muscle Testing Reveals:  Upper Extremities: Right: Full ROM and Muscle Strength 4/5 Left: Decreased ROM 45 Degrees and Muscle Strength 3/5  Left AC Joint Tenderness Thoracic Hypersensitivity: T-1-T-3 Mainly Left Side Lower Extremities: Full ROM and Muscle Strength 5/5  Arises from chair with ease Narrow Based Gait   Skin:    General: Skin is warm and dry.  Neurological:     Mental  Status: He is alert and oriented to person, place, and time.  Psychiatric:        Mood and Affect: Mood normal.        Behavior: Behavior normal.  Assessment & Plan:  1.Cervical Postlaminectomy Syndrome/ Cervicalgia: Continue HEP as Tolerated. Continue to Monitor.05/09/2019. 2. Contracture of Left Shoulder Joint Region: Continue HEP as Tolerated.05/09/2019. 3. Chronic Low Back Pain/ Lumbar Radiculitis: Continue HEP as Tolerated. Continue to Monitor.05/09/2019 4. Chronic Pain Syndrome:Refilled:Oxycodone 10/325 mg one tablet three times a day as needed for pain #90.05/09/2019 We will continue the opioid monitoring program, this consists of regular clinic visits, examinations, urine drug screen, pill counts as well as use of [place] Controlled Substance Reporting system.  23minutes of face to face patient care time was spent during this visit. All questions were encouraged and answered.  F/U in 1 month

## 2019-05-09 NOTE — Telephone Encounter (Signed)
Thanks

## 2019-05-09 NOTE — Telephone Encounter (Signed)
Pt has appt on 7/1

## 2019-05-11 LAB — TOXASSURE SELECT,+ANTIDEPR,UR

## 2019-05-16 ENCOUNTER — Telehealth: Payer: Self-pay | Admitting: *Deleted

## 2019-05-16 NOTE — Telephone Encounter (Signed)
Urine drug screen for this encounter is consistent for prescribed medication 

## 2019-05-20 ENCOUNTER — Other Ambulatory Visit: Payer: Self-pay | Admitting: Endocrinology

## 2019-05-31 ENCOUNTER — Other Ambulatory Visit: Payer: Self-pay | Admitting: Family Medicine

## 2019-05-31 ENCOUNTER — Other Ambulatory Visit: Payer: Self-pay

## 2019-05-31 DIAGNOSIS — I10 Essential (primary) hypertension: Secondary | ICD-10-CM

## 2019-06-01 ENCOUNTER — Encounter: Payer: Medicare Other | Admitting: Family Medicine

## 2019-06-02 ENCOUNTER — Other Ambulatory Visit: Payer: Self-pay

## 2019-06-02 ENCOUNTER — Ambulatory Visit (INDEPENDENT_AMBULATORY_CARE_PROVIDER_SITE_OTHER): Payer: Medicare Other | Admitting: Endocrinology

## 2019-06-02 ENCOUNTER — Encounter: Payer: Self-pay | Admitting: Endocrinology

## 2019-06-02 VITALS — BP 154/78 | HR 78 | Ht 73.0 in | Wt 213.8 lb

## 2019-06-02 DIAGNOSIS — E119 Type 2 diabetes mellitus without complications: Secondary | ICD-10-CM | POA: Diagnosis not present

## 2019-06-02 LAB — POCT GLYCOSYLATED HEMOGLOBIN (HGB A1C): Hemoglobin A1C: 6.9 % — AB (ref 4.0–5.6)

## 2019-06-02 NOTE — Progress Notes (Signed)
Subjective:    Patient ID: Larry Robertson, male    DOB: 08-22-1941, 78 y.o.   MRN: [id number]  HPI Pt returns for f/u of diabetes mellitus:  DM type: 2 Dx'ed: 2010.  Complications: none.  Therapy: 3 oral meds.  DKA: never.  Severe hypoglycemia: never.  Pancreatitis: never.  Other: edema and nocturia (he no longer takes d-DAVP), limit oral rx options; he has never taken insulin.   Interval history: he does not check cbg's, but he takes meds as rx'ed.  pt states he feels well in general.  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   Heart murmur    but was not stated by DR Hilty   Hepatitis    took treatment   HEPATITIS C 08/25/2007   HYPERTENSION 08/25/2007   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   no per pt   HYPOGONADISM 09/27/2009    Past Surgical History:  Procedure Laterality Date   ABDOMINAL HERNIA REPAIR  2000   ANTERIOR CERVICAL DECOMP/DISCECTOMY FUSION Right 03/04/2018   Procedure: Cervical Three-Four, Cervical Six-Seven Right Side approach, Anterior cervical decompression/discectomy/fusion;  Surgeon: Erline Levine, MD;  Location: [facility];  Service: Neurosurgery;  Laterality: Right;  right side anterior approach   CHOLECYSTECTOMY  1999   COLONOSCOPY     LUMBAR FUSION  2005   TONSILLECTOMY AND ADENOIDECTOMY     UPPER GASTROINTESTINAL ENDOSCOPY      Social History   Socioeconomic History   Marital status: Single    Spouse name: Not on file   Number of children: 7   Years of education: Not on file   Highest education level: Not on file  Occupational History   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: Not on file   Food insecurity    Worry: Not on file    Inability: Not on file   Transportation needs    Medical: Not on file    Non-medical: Not on file  Tobacco Use   Smoking status: Never Smoker   Smokeless tobacco: Never Used  Substance and Sexual Activity   Alcohol use: Yes    Comment: 3-4 beers per week; no alcohol per pt as of 02-09-17 visit   Drug use: No   Sexual activity: Not on file  Lifestyle   Physical activity    Days per week: Not on file    Minutes per session: Not on file   Stress: Not on file  Relationships   Social connections    Talks on phone: Not on file    Gets together: Not on file    Attends religious service: Not on file    Active member of club or organization: Not on file    Attends meetings of clubs or organizations: Not on file    Relationship status: Not on file   Intimate partner violence    Fear of current or ex partner: Not on file    Emotionally abused: Not on file    Physically abused: Not on file    Forced sexual activity: Not on file  Other Topics Concern   Not on file  Social History Narrative  Divorced    Current Outpatient Medications on File Prior to Visit  Medication Sig Dispense Refill   amLODipine-olmesartan (AZOR) 10-40 MG tablet Take 1 tablet by mouth daily. 90 tablet 1   atorvastatin (LIPITOR) 20 MG tablet Take 1 tablet (20 mg total) by mouth at bedtime. 90 tablet 1   Blood Glucose Monitoring Suppl (ACCU-CHEK AVIVA) device Use as instructed 1 each 0   colchicine 0.6 MG tablet 1 tablet every hour as needed for gout, not to exceed 6/day 6 tablet 4   DULoxetine (CYMBALTA) 60 MG capsule TAKE 1 CAPSULE BY MOUTH EVERY DAY 90 capsule 2   furosemide (LASIX) 40 MG tablet TAKE 1 TABLET BY MOUTH EVERY DAY 30 tablet 0   glucose blood (ACCU-CHEK AVIVA) test strip 1 each by Other route daily. Use as instructed 100 each 3   JANUVIA 100 MG tablet TAKE 1 TABLET BY MOUTH EVERY DAY 90 tablet 3    metFORMIN (GLUCOPHAGE) 1000 MG tablet TAKE 1 TABLET BY MOUTH 2 TIMES DAILY WITH A MEAL. 180 tablet 1   oxyCODONE-acetaminophen (PERCOCET) 10-325 MG tablet Take 1 tablet by mouth 3 (three) times daily as needed for pain. 90 tablet 0   repaglinide (PRANDIN) 1 MG tablet Take 1 tablet (1 mg total) by mouth 3 (three) times daily before meals. 270 tablet 3   spironolactone (ALDACTONE) 25 MG tablet TAKE 1 TABLET BY MOUTH EVERY DAY 30 tablet 0   traZODone (DESYREL) 100 MG tablet Take 1 tablet by mouth at bedtime.     No current facility-administered medications on file prior to visit.     Allergies  Allergen Reactions   Lyrica [Pregabalin] Other (See Comments)    Blurred vision   Gabapentin Rash and Other (See Comments)    Diplopia (double vision)    Family History  Problem Relation Age of Onset   Stroke Mother    Diabetes Maternal Grandmother    Cancer Neg Hx    Colon cancer Neg Hx    Esophageal cancer Neg Hx    Rectal cancer Neg Hx    Stomach cancer Neg Hx     BP (!) 154/78 (BP Location: Left Arm, Patient Position: Sitting, Cuff Size: Large)    Pulse 78    Ht 6\' 1"  (1.854 m)    Wt 213 lb 12.8 oz (97 kg)    SpO2 96%    BMI 28.21 kg/m    Review of Systems He denies hypoglycemia.      Objective:   Physical Exam VITAL SIGNS:  See vs page GENERAL: no distress Pulses: dorsalis pedis intact bilat.   MSK: no deformity of the feet CV: no leg edema Skin:  no ulcer on the feet.  normal color and temp on the feet.  Neuro: sensation is intact to touch on the feet.    Lab Results  Component Value Date   HGBA1C 6.9 (A) 06/02/2019       Assessment & Plan:  HTN: is noted today Type 2 DM: well-controlled   Patient Instructions  Your blood pressure is high today.  Please see your primary care provider soon, to have it rechecked Please continue the same medications.  Please come back for a follow-up appointment in 4 months.

## 2019-06-02 NOTE — Patient Instructions (Addendum)
Your blood pressure is high today.  Please see your primary care provider soon, to have it rechecked Please continue the same medications.  Please come back for a follow-up appointment in 4 months.

## 2019-06-08 ENCOUNTER — Encounter: Payer: Self-pay | Admitting: Registered Nurse

## 2019-06-08 ENCOUNTER — Encounter: Payer: Medicare Other | Attending: Physical Medicine & Rehabilitation | Admitting: Registered Nurse

## 2019-06-08 ENCOUNTER — Other Ambulatory Visit: Payer: Self-pay

## 2019-06-08 VITALS — BP 154/82 | HR 73 | Temp 97.7°F | Resp 12 | Ht 73.0 in | Wt 213.0 lb

## 2019-06-08 DIAGNOSIS — Z5181 Encounter for therapeutic drug level monitoring: Secondary | ICD-10-CM

## 2019-06-08 DIAGNOSIS — M542 Cervicalgia: Secondary | ICD-10-CM | POA: Diagnosis not present

## 2019-06-08 DIAGNOSIS — M25562 Pain in left knee: Secondary | ICD-10-CM

## 2019-06-08 DIAGNOSIS — G894 Chronic pain syndrome: Secondary | ICD-10-CM

## 2019-06-08 DIAGNOSIS — Z9889 Other specified postprocedural states: Secondary | ICD-10-CM | POA: Diagnosis not present

## 2019-06-08 DIAGNOSIS — M5416 Radiculopathy, lumbar region: Secondary | ICD-10-CM

## 2019-06-08 DIAGNOSIS — M25561 Pain in right knee: Secondary | ICD-10-CM

## 2019-06-08 DIAGNOSIS — I10 Essential (primary) hypertension: Secondary | ICD-10-CM | POA: Diagnosis not present

## 2019-06-08 DIAGNOSIS — E119 Type 2 diabetes mellitus without complications: Secondary | ICD-10-CM | POA: Insufficient documentation

## 2019-06-08 DIAGNOSIS — M24512 Contracture, left shoulder: Secondary | ICD-10-CM

## 2019-06-08 DIAGNOSIS — M961 Postlaminectomy syndrome, not elsewhere classified: Secondary | ICD-10-CM | POA: Diagnosis not present

## 2019-06-08 DIAGNOSIS — G8929 Other chronic pain: Secondary | ICD-10-CM | POA: Insufficient documentation

## 2019-06-08 DIAGNOSIS — Z79891 Long term (current) use of opiate analgesic: Secondary | ICD-10-CM

## 2019-06-08 DIAGNOSIS — M25519 Pain in unspecified shoulder: Secondary | ICD-10-CM | POA: Diagnosis not present

## 2019-06-08 MED ORDER — OXYCODONE-ACETAMINOPHEN 10-325 MG PO TABS: 1.0000 | ORAL_TABLET | Freq: Three times a day (TID) | ORAL | 0 refills | Status: DC | PRN

## 2019-06-08 NOTE — Progress Notes (Signed)
Subjective:    Patient ID: Larry Robertson, male    DOB: 11-22-1941, 78 y.o.   MRN: [id number]  HPI: Larry Robertson is a 78 y.o. male who returns for follow up appointment for chronic pain and medication refill. He states his pain is located in his neck, left shoulder, lower back radiating into his bilateral lower extremities and bilateral knee pain. He rates his pain 8. His current exercise regime is walking, using he elliptical for 5 minutes three days a week and performing stretching exercises.  Larry Robertson Morphine equivalent is 45.00 MME.  Last UDs was Performed on 05/09/2019, it was consistent.   Pain Inventory Average Pain 7 Pain Right Now 8 My pain is aching  In the last 24 hours, has pain interfered with the following? General activity 6 Relation with others 5 Enjoyment of life 7 What TIME of day is your pain at its worst? n/a Sleep (in general) Poor  Pain is worse with: walking Pain improves with: medication Relief from Meds: 7  Mobility ability to climb steps?  yes do you drive?  yes  Function not employed: date last employed n/a retired  Neuro/Psych trouble walking  Prior Studies no  Physicians involved in your care no   Family History  Problem Relation Age of Onset  . Stroke Mother   . Diabetes Maternal Grandmother   . Cancer Neg Hx   . Colon cancer Neg Hx   . Esophageal cancer Neg Hx   . Rectal cancer Neg Hx   . Stomach cancer Neg Hx    Social History   Socioeconomic History  . Marital status: Single    Spouse name: Not on file  . Number of children: 7  . Years of education: Not on file  . Highest education level: Not on file  Occupational History  . 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 Financial resource strain: Not on file  . Food insecurity    Worry: Not on file    Inability: Not on file  . Transportation needs    Medical: Not on file    Non-medical: Not on file  Tobacco Use  . Smoking  status: Never Smoker  . Smokeless tobacco: Never Used  Substance and Sexual Activity  . Alcohol use: Yes    Comment: 3-4 beers per week; no alcohol per pt as of 02-09-17 visit  . Drug use: No  . Sexual activity: Not on file  Lifestyle  . Physical activity    Days per week: Not on file    Minutes per session: Not on file  . Stress: Not on file  Relationships  . Social Herbalist on phone: Not on file    Gets together: Not on file    Attends religious service: Not on file    Active member of club or organization: Not on file    Attends meetings of clubs or organizations: Not on file    Relationship status: Not on file  Other Topics Concern  . Not on file  Social History Narrative   Divorced   Past Surgical History:  Procedure Laterality Date  . ABDOMINAL HERNIA REPAIR  2000  . ANTERIOR CERVICAL DECOMP/DISCECTOMY FUSION Right 03/04/2018   Procedure: Cervical Three-Four, Cervical Six-Seven Right Side approach, Anterior cervical decompression/discectomy/fusion;  Surgeon: Erline Levine, MD;  Location: [facility];  Service: Neurosurgery;  Laterality: Right;  right side anterior approach  . CHOLECYSTECTOMY  1999  .  COLONOSCOPY    . LUMBAR FUSION  2005  . TONSILLECTOMY AND ADENOIDECTOMY    . UPPER GASTROINTESTINAL ENDOSCOPY     Past Medical History:  Diagnosis Date  . Arthritis   . Blood transfusion without reported diagnosis   . Colon polyps 05/19/2012  . 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 DIABETES MELLITUS, TYPE II 08/25/2007  . Diastolic dysfunction    on echo  . Diverticulosis   . ERECTILE DYSFUNCTION 08/25/2007  . 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 Heart murmur    but was not stated by DR Hilty  . Hepatitis    took treatment  . HEPATITIS C 08/25/2007  . HYPERTENSION 08/25/2007  . 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 HYPOGONADISM 09/27/2009   There were no vitals taken for this visit.  Opioid Risk Score:   Fall Risk Score:  `1   Depression screen PHQ 2/9  Depression screen [facility] 2/9 03/09/2019 02/09/2019 01/11/2019 11/08/2018 11/05/2018 10/18/2018  Decreased Interest 0 0 0 0 0 0  Down, Depressed, Hopeless - 0 0 0 0 0  PHQ - 2 Score 0 0 0 0 0 0  Altered sleeping - - - - - 0  Tired, decreased energy - - - - - 2  Change in appetite - - - - - 0  Feeling bad or failure about yourself  - - - - - 0  Trouble concentrating - - - - - 0  Moving slowly or fidgety/restless - - - - - 0  Suicidal thoughts - - - - - 0  PHQ-9 Score - - - - - 2     Review of Systems  All other systems reviewed and are negative.      Objective:   Physical Exam Vitals signs and nursing note reviewed.  Constitutional:      Appearance: Normal appearance.  Neck:     Musculoskeletal: Normal range of motion and neck supple.  Cardiovascular:     Rate and Rhythm: Normal rate and regular rhythm.     Pulses: Normal pulses.     Heart sounds: Normal heart sounds.  Pulmonary:     Effort: Pulmonary effort is normal.     Breath sounds: Normal breath sounds.  Musculoskeletal:     Comments: Normal Muscle Bulk and Muscle Testing Reveals:  Upper Extremities: Right Full ROM and Muscle Strength 5/5  Left: Decreased ROM 45 Degrees and Muscle Strength 4/5 Lumbar Paraspinal Tenderness: L-4-L-5 Lower Extremities: Full ROM and Muscle Strength 5/5 Bilateral Lower Extremities Flexion Produces Pain into Bilateral Patella's  Antalgic Gait   Neurological:     Mental Status: He is alert and oriented to person, place, and time.  Psychiatric:        Mood and Affect: Mood normal.        Behavior: Behavior normal.           Assessment & Plan:  1.Cervical Postlaminectomy Syndrome/ Cervicalgia: Continue HEP as Tolerated. Continue to Monitor.06/08/2019. 2. Contracture of Left Shoulder Joint Region: Continue HEP as Tolerated.06/08/2019. 3. Chronic Low Back Pain/ Lumbar Radiculitis: Continue HEP as Tolerated. Continue to Monitor.06/08/2019 4. Chronic Pain  Syndrome:Refilled:Oxycodone 10/325 mg one tablet three times a day as needed for pain #90.06/08/2019 We will continue the opioid monitoring program, this consists of regular clinic visits, examinations, urine drug screen, pill counts as well as use of [place] Controlled Substance Reporting system.  23minutes of face to face patient care time was spent during this visit. All questions were  encouraged and answered.  F/U in 1 month

## 2019-06-13 ENCOUNTER — Telehealth: Payer: Self-pay

## 2019-06-13 ENCOUNTER — Other Ambulatory Visit: Payer: Self-pay

## 2019-06-13 NOTE — Patient Outreach (Signed)
[facility]) Care Management  06/13/2019  Larry Robertson [birth year] [id number]   Medication Adherence call to Mr. Larry Robertson HIPPA Compliant Voice message left with a call back number. Mr. Larry Robertson is showing past due on Amlodepine/Olmesartan 10/40 mg under [facility].  [place] Management Direct Dial [phone]  Fax [phone] [email or web address]

## 2019-06-13 NOTE — Telephone Encounter (Signed)

## 2019-06-14 ENCOUNTER — Encounter: Payer: Self-pay | Admitting: Family Medicine

## 2019-06-14 ENCOUNTER — Ambulatory Visit (INDEPENDENT_AMBULATORY_CARE_PROVIDER_SITE_OTHER): Payer: Medicare Other | Admitting: Family Medicine

## 2019-06-14 VITALS — BP 158/88 | HR 78 | Ht 73.0 in | Wt 214.4 lb

## 2019-06-14 DIAGNOSIS — E7800 Pure hypercholesterolemia, unspecified: Secondary | ICD-10-CM | POA: Diagnosis not present

## 2019-06-14 DIAGNOSIS — K7460 Unspecified cirrhosis of liver: Secondary | ICD-10-CM | POA: Diagnosis not present

## 2019-06-14 DIAGNOSIS — I10 Essential (primary) hypertension: Secondary | ICD-10-CM | POA: Diagnosis not present

## 2019-06-14 DIAGNOSIS — Z8619 Personal history of other infectious and parasitic diseases: Secondary | ICD-10-CM

## 2019-06-14 DIAGNOSIS — M109 Gout, unspecified: Secondary | ICD-10-CM | POA: Diagnosis not present

## 2019-06-14 DIAGNOSIS — E119 Type 2 diabetes mellitus without complications: Secondary | ICD-10-CM

## 2019-06-14 DIAGNOSIS — M25562 Pain in left knee: Secondary | ICD-10-CM

## 2019-06-14 DIAGNOSIS — Z0000 Encounter for general adult medical examination without abnormal findings: Secondary | ICD-10-CM

## 2019-06-14 DIAGNOSIS — M25561 Pain in right knee: Secondary | ICD-10-CM

## 2019-06-14 DIAGNOSIS — G8929 Other chronic pain: Secondary | ICD-10-CM

## 2019-06-14 LAB — CBC
HCT: 41.1 % (ref 39.0–52.0)
Hemoglobin: 14 g/dL (ref 13.0–17.0)
MCHC: 34.1 g/dL (ref 30.0–36.0)
MCV: 90 fl (ref 78.0–100.0)
Platelets: 161 10*3/uL (ref 150.0–400.0)
RBC: 4.56 Mil/uL (ref 4.22–5.81)
RDW: 13.2 % (ref 11.5–15.5)
WBC: 7.4 10*3/uL (ref 4.0–10.5)

## 2019-06-14 LAB — URINALYSIS, ROUTINE W REFLEX MICROSCOPIC
Bilirubin Urine: NEGATIVE
Hgb urine dipstick: NEGATIVE
Ketones, ur: NEGATIVE
Leukocytes,Ua: NEGATIVE
Nitrite: NEGATIVE
RBC / HPF: NONE SEEN (ref 0–?)
Specific Gravity, Urine: 1.01 (ref 1.000–1.030)
Total Protein, Urine: NEGATIVE
Urine Glucose: NEGATIVE
Urobilinogen, UA: 0.2 (ref 0.0–1.0)
WBC, UA: NONE SEEN (ref 0–?)
pH: 6 (ref 5.0–8.0)

## 2019-06-14 LAB — COMPREHENSIVE METABOLIC PANEL
ALT: 27 U/L (ref 0–53)
AST: 20 U/L (ref 0–37)
Albumin: 4.9 g/dL (ref 3.5–5.2)
Alkaline Phosphatase: 39 U/L (ref 39–117)
BUN: 13 mg/dL (ref 6–23)
CO2: 26 mEq/L (ref 19–32)
Calcium: 9.6 mg/dL (ref 8.4–10.5)
Chloride: 102 mEq/L (ref 96–112)
Creatinine, Ser: 0.93 mg/dL (ref 0.40–1.50)
GFR: 78.33 mL/min (ref >60.00)
Glucose, Bld: 163 mg/dL — ABNORMAL HIGH (ref 70–99)
Potassium: 4 mEq/L (ref 3.5–5.1)
Sodium: 139 mEq/L (ref 135–145)
Total Bilirubin: 0.7 mg/dL (ref 0.2–1.2)
Total Protein: 7.1 g/dL (ref 6.0–8.3)

## 2019-06-14 LAB — LIPID PANEL
Cholesterol: 126 mg/dL (ref 0–200)
HDL: 45 mg/dL (ref >39.00)
NonHDL: 80.97
Total CHOL/HDL Ratio: 3
Triglycerides: 207 mg/dL — ABNORMAL HIGH (ref 0.0–149.0)
VLDL: 41.4 mg/dL — ABNORMAL HIGH (ref 0.0–40.0)

## 2019-06-14 LAB — URIC ACID: Uric Acid, Serum: 9.2 mg/dL — ABNORMAL HIGH (ref 4.0–7.8)

## 2019-06-14 LAB — GAMMA GT: GGT: 26 U/L (ref 7–51)

## 2019-06-14 LAB — LDL CHOLESTEROL, DIRECT: Direct LDL: 55 mg/dL

## 2019-06-14 MED ORDER — AMLODIPINE-OLMESARTAN 10-40 MG PO TABS: 1.0000 | ORAL_TABLET | Freq: Every day | ORAL | 0 refills | Status: DC

## 2019-06-14 NOTE — Addendum Note (Signed)
Addended by: Jon Billings on: 06/14/2019 03:52 PM   Modules accepted: Orders

## 2019-06-14 NOTE — Patient Instructions (Signed)
Health Maintenance After Age 79 After age 27, you are at a higher risk for certain long-term diseases and infections as well as injuries from falls. Falls are a major cause of broken bones and head injuries in people who are older than age 29. Getting regular preventive care can help to keep you healthy and well. Preventive care includes getting regular testing and making lifestyle changes as recommended by your health care provider. Talk with your health care provider about:  Which screenings and tests you should have. A screening is a test that checks for a disease when you have no symptoms.  A diet and exercise plan that is right for you. What should I know about screenings and tests to prevent falls? Screening and testing are the best ways to find a health problem early. Early diagnosis and treatment give you the best chance of managing medical conditions that are common after age 79. Certain conditions and lifestyle choices may make you more likely to have a fall. Your health care provider may recommend:  Regular vision checks. Poor vision and conditions such as cataracts can make you more likely to have a fall. If you wear glasses, make sure to get your prescription updated if your vision changes.  Medicine review. Work with your health care provider to regularly review all of the medicines you are taking, including over-the-counter medicines. Ask your health care provider about any side effects that may make you more likely to have a fall. Tell your health care provider if any medicines that you take make you feel dizzy or sleepy.  Osteoporosis screening. Osteoporosis is a condition that causes the bones to get weaker. This can make the bones weak and cause them to break more easily.  Blood pressure screening. Blood pressure changes and medicines to control blood pressure can make you feel dizzy.  Strength and balance checks. Your health care provider may recommend certain tests to check your  strength and balance while standing, walking, or changing positions.  Foot health exam. Foot pain and numbness, as well as not wearing proper footwear, can make you more likely to have a fall.  Depression screening. You may be more likely to have a fall if you have a fear of falling, feel emotionally low, or feel unable to do activities that you used to do.  Alcohol use screening. Using too much alcohol can affect your balance and may make you more likely to have a fall. What actions can I take to lower my risk of falls? General instructions  Talk with your health care provider about your risks for falling. Tell your health care provider if: ? You fall. Be sure to tell your health care provider about all falls, even ones that seem minor. ? You feel dizzy, sleepy, or off-balance.  Take over-the-counter and prescription medicines only as told by your health care provider. These include any supplements.  Eat a healthy diet and maintain a healthy weight. A healthy diet includes low-fat dairy products, low-fat (lean) meats, and fiber from whole grains, beans, and lots of fruits and vegetables. Home safety  Remove any tripping hazards, such as rugs, cords, and clutter.  Install safety equipment such as grab bars in bathrooms and safety rails on stairs.  Keep rooms and walkways well-lit. Activity   Follow a regular exercise program to stay fit. This will help you maintain your balance. Ask your health care provider what types of exercise are appropriate for you.  If you need a cane or  walker, use it as recommended by your health care provider.  Wear supportive shoes that have nonskid soles. Lifestyle  Do not drink alcohol if your health care provider tells you not to drink.  If you drink alcohol, limit how much you have: ? 0-1 drink a day for women. ? 0-2 drinks a day for men.  Be aware of how much alcohol is in your drink. In the [place] one drink equals one typical bottle of beer (12  oz), one-half glass of wine (5 oz), or one shot of hard liquor (1 oz).  Do not use any products that contain nicotine or tobacco, such as cigarettes and e-cigarettes. If you need help quitting, ask your health care provider. Summary  Having a healthy lifestyle and getting preventive care can help to protect your health and wellness after age 20.  Screening and testing are the best way to find a health problem early and help you avoid having a fall. Early diagnosis and treatment give you the best chance for managing medical conditions that are more common for people who are older than age 60.  Falls are a major cause of broken bones and head injuries in people who are older than age 47. Take precautions to prevent a fall at home.  Work with your health care provider to learn what changes you can make to improve your health and wellness and to prevent falls. This information is not intended to replace advice given to you by your health care provider. Make sure you discuss any questions you have with your health care provider. Document Released: 09/30/2017 Document Revised: 03/10/2019 Document Reviewed: 09/30/2017 Elsevier Patient Education  2020 [employer].  Cirrhosis  Cirrhosis is long-term (chronic) liver injury. The liver is the body's largest internal organ, and it performs many functions. It converts food into energy, removes toxic material from the blood, makes important proteins, and absorbs necessary vitamins from food. In cirrhosis, healthy liver cells are replaced by scar tissue. This prevents blood from flowing through the liver, making it difficult for the liver to function. Scarring of the liver cannot be reversed, but treatment can prevent it from getting worse. What are the causes? Common causes of this condition are hepatitis C and long-term alcohol abuse. Other causes include:  Nonalcoholic fatty liver disease. This happens when fat is deposited in the liver by causes other  than alcohol.  Hepatitis B infection.  Autoimmune hepatitis. In this condition, the body's defense system (immune system) mistakenly attacks the liver cells, causing irritation and swelling (inflammation).  Diseases that cause blockage of ducts inside the liver.  Inherited liver diseases, such as hemochromatosis. This is one of the most common inherited liver diseases. In this disease, deposits of iron collect in the liver and other organs.  Reactions to certain long-term medicines, such as amiodarone, a heart medicine.  Parasitic infections. These include schistosomiasis, which is caused by a flatworm.  Long-term contact to certain toxins. These toxins include certain organic solvents, such as toluene and chloroform. What increases the risk? You are more likely to develop this condition if:  You have certain types of viral hepatitis.  You abuse alcohol, especially if you are male.  You are overweight.  You share needles.  You have unprotected sex with someone who has viral hepatitis. What are the signs or symptoms? You may not have any signs and symptoms at first. Symptoms may not develop until the damage to your liver starts to get worse. Early symptoms may include:  Weakness and tiredness (fatigue).  Changes in sleep patterns or having trouble sleeping.  Itchiness.  Tenderness in the right-upper part of your abdomen.  Weight loss and muscle loss.  Nausea.  Loss of appetite.  Appearance of tiny blood vessels under the skin. Later symptoms may include:  Fatigue or weakness that is getting worse.  Yellow skin and eyes (jaundice).  Buildup of fluid in the abdomen (ascites). You may notice that your clothes are tight around your waist.  Weight gain.  Swelling of the feet and ankles (edema).  Trouble breathing.  Easy bruising and bleeding.  Vomiting blood.  Black or bloody stool.  Mental confusion. How is this diagnosed? Your health care provider may  suspect cirrhosis based on your symptoms and medical history, especially if you have other medical conditions or a history of alcohol abuse. Your health care provider will do a physical exam to feel your liver and to check for signs of cirrhosis. He or she may perform other tests, including:  Blood tests to check: ? For hepatitis B or C. ? Kidney function. ? Liver function.  Imaging tests such as: ? MRI or CT scan to look for changes seen in advanced cirrhosis. ? Ultrasound to see if normal liver tissue is being replaced by scar tissue.  A procedure in which a long needle is used to take a sample of liver tissue to be checked in a lab (biopsy). Liver biopsy can confirm the diagnosis of cirrhosis. How is this treated? Treatment for this condition depends on how damaged your liver is and what caused the damage. It may include treating the symptoms of cirrhosis, or treating the underlying causes in order to slow the damage. Treatment may include:  Making lifestyle changes, such as: ? Eating a healthy diet. You may need to work with your health care provider or a diet and nutrition specialist (dietitian) to develop an eating plan. ? Restricting salt intake. ? Maintaining a healthy weight. ? Not abusing drugs or alcohol.  Taking medicines to: ? Treat liver infections or other infections. ? Control itching. ? Reduce fluid buildup. ? Reduce certain blood toxins. ? Reduce risk of bleeding from enlarged blood vessels in the stomach or esophagus (varices).  Liver transplant. In this procedure, a liver from a donor is used to replace your diseased liver. This is done if cirrhosis has caused liver failure. Other treatments and procedures may be done depending on the problems that you get from cirrhosis. Common problems include liver-related kidney failure (hepatorenal syndrome). Follow these instructions at home:   Take medicines only as told by your health care provider. Do not use medicines  that are toxic to your liver. Ask your health care provider before taking any new medicines, including over-the-counter medicines.  Rest as needed.  Eat a well-balanced diet. Ask your health care provider or dietitian for more information.  Limit your salt or water intake, if your health care provider asks you to do this.  Do not drink alcohol. This is especially important if you are taking acetaminophen.  Keep all follow-up visits as told by your health care provider. This is important. Contact a health care provider if you:  Have fatigue or weakness that is getting worse.  Develop swelling of the hands, feet, legs, or face.  Have a fever.  Develop loss of appetite.  Have nausea or vomiting.  Develop jaundice.  Develop easy bruising or bleeding. Get help right away if you:  Vomit bright red blood or a  material that looks like coffee grounds.  Have blood in your stools.  Notice that your stools appear black and tarry.  Become confused.  Have chest pain or trouble breathing. Summary  Cirrhosis is chronic liver injury. Liver damage cannot be reversed. Common causes are hepatitis C and long-term alcohol abuse.  Tests used to diagnose cirrhosis include blood tests, imaging tests, and liver biopsy.  Treatment for this condition involves treating the underlying cause. Avoid alcohol, drugs, salt, and medicines that may damage your liver.  Contact your health care provider if you develop ascites, edema, jaundice, fever, nausea or vomiting, easy bruising or bleeding, or worsening fatigue. This information is not intended to replace advice given to you by your health care provider. Make sure you discuss any questions you have with your health care provider. Document Released: 11/17/2005 Document Revised: 03/09/2019 Document Reviewed: 10/07/2017 Elsevier Patient Education  2020 [employer].

## 2019-06-14 NOTE — Progress Notes (Addendum)
Established Patient Office Visit  Subjective:  Patient ID: Larry Robertson, male    DOB: 12-Sep-1941  Age: 78 y.o. MRN: [id number]  CC:  Chief Complaint  Patient presents with  . Annual Exam    HPI Larry Robertson presents for establishment of care by way of transfer.  Patient is being followed by Dr. Loanne Drilling for his diabetes.  He is also being seen by physical medicine for chronic pain in his neck and shoulders and other joints.  Past medical history include hypertension elevated cholesterol hepatitis C and cirrhosis of his liver.  Hep C has been treated.  He has been out of his Azor for the last 2 weeks.  He tells me that his blood pressure runs in the 120/80s range when he is taking it.  Reports compliance with his atorvastatin.  He has never had a heart attack or stroke.  He takes colchicine and Indocin as needed for gouty attacks.  He has taken allopurinol in the past.  But not recently.  Says that he drinks 1-2 beers on occasion.  He has never smoked.  He complains of chronic knee pain and stiffness.  He lives alone.  He has 7 daughters some of which check on him daily.  He has a home gym and an elliptical.  Past Medical History:  Diagnosis Date  . Arthritis   . Blood transfusion without reported diagnosis   . Colon polyps 05/19/2012  . 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 DIABETES MELLITUS, TYPE II 08/25/2007  . Diastolic dysfunction    on echo  . Diverticulosis   . ERECTILE DYSFUNCTION 08/25/2007  . 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 Heart murmur    but was not stated by DR Hilty  . Hepatitis    took treatment  . HEPATITIS C 08/25/2007  . HYPERTENSION 08/25/2007  . 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 HYPOGONADISM 09/27/2009    Past Surgical History:  Procedure Laterality Date  . ABDOMINAL HERNIA REPAIR  2000  . ANTERIOR CERVICAL DECOMP/DISCECTOMY FUSION Right 03/04/2018   Procedure: Cervical Three-Four, Cervical Six-Seven Right Side approach,  Anterior cervical decompression/discectomy/fusion;  Surgeon: Erline Levine, MD;  Location: [facility];  Service: Neurosurgery;  Laterality: Right;  right side anterior approach  . CHOLECYSTECTOMY  1999  . COLONOSCOPY    . LUMBAR FUSION  2005  . TONSILLECTOMY AND ADENOIDECTOMY    . UPPER GASTROINTESTINAL ENDOSCOPY      Family History  Problem Relation Age of Onset  . Stroke Mother   . Diabetes Maternal Grandmother   . Cancer Neg Hx   . Colon cancer Neg Hx   . Esophageal cancer Neg Hx   . Rectal cancer Neg Hx   . Stomach cancer Neg Hx     Social History   Socioeconomic History  . Marital status: Single    Spouse name: Not on file  . Number of children: 7  . Years of education: Not on file  . Highest education level: Not on file  Occupational History  . 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 Financial resource strain: Not on file  . Food insecurity    Worry: Not on file    Inability: Not on file  . Transportation needs    Medical: Not on file    Non-medical: Not on file  Tobacco Use  . Smoking status: Never Smoker  . Smokeless tobacco: Never Used  Substance and Sexual Activity  .  Alcohol use: Yes    Comment: 3-4 beers per week; no alcohol per pt as of 02-09-17 visit  . Drug use: No  . Sexual activity: Not on file  Lifestyle  . Physical activity    Days per week: Not on file    Minutes per session: Not on file  . Stress: Not on file  Relationships  . Social Herbalist on phone: Not on file    Gets together: Not on file    Attends religious service: Not on file    Active member of club or organization: Not on file    Attends meetings of clubs or organizations: Not on file    Relationship status: Not on file  . Intimate partner violence    Fear of current or ex partner: Not on file    Emotionally abused: Not on file    Physically abused: Not on file    Forced sexual activity: Not on file  Other Topics Concern  . Not on  file  Social History Narrative   Divorced    Outpatient Medications Prior to Visit  Medication Sig Dispense Refill  . atorvastatin (LIPITOR) 20 MG tablet Take 1 tablet (20 mg total) by mouth at bedtime. 90 tablet 1  . Blood Glucose Monitoring Suppl (ACCU-CHEK AVIVA) device Use as instructed 1 each 0  . colchicine 0.6 MG tablet 1 tablet every hour as needed for gout, not to exceed 6/day 6 tablet 4  . DULoxetine (CYMBALTA) 60 MG capsule TAKE 1 CAPSULE BY MOUTH EVERY DAY 90 capsule 2  . furosemide (LASIX) 40 MG tablet TAKE 1 TABLET BY MOUTH EVERY DAY 30 tablet 0  . glucose blood (ACCU-CHEK AVIVA) test strip 1 each by Other route daily. Use as instructed 100 each 3  . JANUVIA 100 MG tablet TAKE 1 TABLET BY MOUTH EVERY DAY 90 tablet 3  . metFORMIN (GLUCOPHAGE) 1000 MG tablet TAKE 1 TABLET BY MOUTH 2 TIMES DAILY WITH A MEAL. 180 tablet 1  . oxyCODONE-acetaminophen (PERCOCET) 10-325 MG tablet Take 1 tablet by mouth 3 (three) times daily as needed for pain. 90 tablet 0  . repaglinide (PRANDIN) 1 MG tablet Take 1 tablet (1 mg total) by mouth 3 (three) times daily before meals. 270 tablet 3  . spironolactone (ALDACTONE) 25 MG tablet TAKE 1 TABLET BY MOUTH EVERY DAY 30 tablet 0  . traZODone (DESYREL) 100 MG tablet Take 1 tablet by mouth at bedtime.    Marland Kitchen amLODipine-olmesartan (AZOR) 10-40 MG tablet Take 1 tablet by mouth daily. 90 tablet 1   No facility-administered medications prior to visit.     Allergies  Allergen Reactions  . Lyrica [Pregabalin] Other (See Comments)    Blurred vision  . Gabapentin Rash and Other (See Comments)    Diplopia (double vision)    ROS Review of Systems  Constitutional: Negative.   HENT: Negative.   Eyes: Negative for photophobia and visual disturbance.  Respiratory: Negative.   Cardiovascular: Negative.   Endocrine: Negative for polyphagia and polyuria.  Genitourinary: Negative.   Musculoskeletal: Positive for arthralgias, neck pain and neck stiffness.   Skin: Negative for color change and pallor.  Allergic/Immunologic: Negative for immunocompromised state.  Neurological: Negative for light-headedness and headaches.  Hematological: Does not bruise/bleed easily.  Psychiatric/Behavioral: Negative.       Objective:    Physical Exam  Constitutional: He is oriented to person, place, and time. He appears well-developed and well-nourished. No distress.  HENT:  Head: Normocephalic and  atraumatic.  Right Ear: External ear normal.  Left Ear: External ear normal.  Mouth/Throat: Oropharynx is clear and moist. No oropharyngeal exudate.  Eyes: Pupils are equal, round, and reactive to light. Conjunctivae are normal. Right eye exhibits no discharge. Left eye exhibits no discharge. No scleral icterus.  Neck: Neck supple. No JVD present. No tracheal deviation present. No thyromegaly present.  Cardiovascular: Normal rate, regular rhythm and normal heart sounds.  Pulmonary/Chest: Effort normal and breath sounds normal.  Abdominal: Bowel sounds are normal. He exhibits no distension. There is no abdominal tenderness. There is no rebound and no guarding. A hernia is present. Hernia confirmed positive in the ventral area.  Musculoskeletal:        General: No edema.  Lymphadenopathy:    He has no cervical adenopathy.  Neurological: He is alert and oriented to person, place, and time.  Skin: Skin is warm and dry. He is not diaphoretic.  Psychiatric: He has a normal mood and affect.    BP (!) 158/88   Pulse 78   Ht 6\' 1"  (1.854 m)   Wt 214 lb 6 oz (97.2 kg)   SpO2 92%   BMI 28.28 kg/m  Wt Readings from Last 3 Encounters:  06/14/19 214 lb 6 oz (97.2 kg)  06/08/19 213 lb (96.6 kg)  06/02/19 213 lb 12.8 oz (97 kg)   BP Readings from Last 3 Encounters:  06/14/19 (!) 158/88  06/08/19 (!) 154/82  06/02/19 (!) 154/78   Guideline developer:  UpToDate (see UpToDate for funding source) Date Released: June 2014  There are no preventive care reminders  to display for this patient.  There are no preventive care reminders to display for this patient.  Lab Results  Component Value Date   TSH 1.83 09/01/2018   Lab Results  Component Value Date   WBC 7.4 06/14/2019   HGB 14.0 06/14/2019   HCT 41.1 06/14/2019   MCV 90.0 06/14/2019   PLT 161.0 06/14/2019   Lab Results  Component Value Date   NA 139 06/14/2019   K 4.0 06/14/2019   CO2 26 06/14/2019   GLUCOSE 163 (H) 06/14/2019   BUN 13 06/14/2019   CREATININE 0.93 06/14/2019   BILITOT 0.7 06/14/2019   ALKPHOS 39 06/14/2019   AST 20 06/14/2019   ALT 27 06/14/2019   PROT 7.1 06/14/2019   ALBUMIN 4.9 06/14/2019   CALCIUM 9.6 06/14/2019   ANIONGAP 14 02/24/2018   GFR 78.33 06/14/2019   Lab Results  Component Value Date   CHOL 126 06/14/2019   Lab Results  Component Value Date   HDL 45.00 06/14/2019   Lab Results  Component Value Date   LDLCALC 39 01/21/2018   Lab Results  Component Value Date   TRIG 207.0 (H) 06/14/2019   Lab Results  Component Value Date   CHOLHDL 3 06/14/2019   Lab Results  Component Value Date   HGBA1C 6.9 (A) 06/02/2019      Assessment & Plan:   Problem List Items Addressed This Visit      Cardiovascular and Mediastinum   Essential hypertension - Primary   Relevant Medications   amLODipine-olmesartan (AZOR) 10-40 MG tablet   Other Relevant Orders   CBC (Completed)   Comprehensive metabolic panel (Completed)   Urinalysis, Routine w reflex microscopic (Completed)     Digestive   History of hepatitis C   Cirrhosis of liver without ascites ([place])   Relevant Orders   Comprehensive metabolic panel (Completed)   Gamma GT (Completed)  Endocrine   Diabetes ([place])   Relevant Medications   amLODipine-olmesartan (AZOR) 10-40 MG tablet     Musculoskeletal and Integument   Gouty arthropathy   Relevant Orders   Uric acid (Completed)     Other   Healthcare maintenance   Elevated cholesterol   Relevant Medications    amLODipine-olmesartan (AZOR) 10-40 MG tablet   Other Relevant Orders   LDL cholesterol, direct (Completed)   Lipid panel (Completed)    Other Visit Diagnoses    Chronic pain of both knees       Relevant Orders   Ambulatory referral to Sports Medicine      Meds ordered this encounter  Medications  . amLODipine-olmesartan (AZOR) 10-40 MG tablet    Sig: Take 1 tablet by mouth daily.    Dispense:  90 tablet    Refill:  0    Follow-up: Return in about 3 months (around 09/14/2019).   Patient was given information on health maintenance and cirrhosis.  I advised him to stop drinking altogether.  And a discussion with patient about being seen every 3 months.  He agreed that that would be best.

## 2019-06-14 NOTE — Addendum Note (Signed)
Addended by: Jon Billings on: 06/14/2019 03:50 PM   Modules accepted: Orders

## 2019-06-16 ENCOUNTER — Ambulatory Visit (INDEPENDENT_AMBULATORY_CARE_PROVIDER_SITE_OTHER): Payer: Medicare Other | Admitting: Family Medicine

## 2019-06-16 ENCOUNTER — Ambulatory Visit: Payer: Self-pay

## 2019-06-16 ENCOUNTER — Other Ambulatory Visit: Payer: Self-pay

## 2019-06-16 ENCOUNTER — Ambulatory Visit (HOSPITAL_BASED_OUTPATIENT_CLINIC_OR_DEPARTMENT_OTHER)
Admission: RE | Admit: 2019-06-16 | Discharge: 2019-06-16 | Disposition: A | Discharge status: Home / Self Care | Payer: Medicare Other | Source: Ambulatory Visit | Attending: Family Medicine | Admitting: Family Medicine

## 2019-06-16 ENCOUNTER — Encounter: Payer: Self-pay | Admitting: Family Medicine

## 2019-06-16 VITALS — BP 143/82 | HR 70 | Ht 73.0 in | Wt 217.0 lb

## 2019-06-16 DIAGNOSIS — M25561 Pain in right knee: Secondary | ICD-10-CM | POA: Diagnosis not present

## 2019-06-16 DIAGNOSIS — G8929 Other chronic pain: Secondary | ICD-10-CM

## 2019-06-16 DIAGNOSIS — M25562 Pain in left knee: Secondary | ICD-10-CM | POA: Diagnosis not present

## 2019-06-16 MED ORDER — TRIAMCINOLONE ACETONIDE 40 MG/ML IJ SUSP
40.0000 mg | Freq: Once | INTRAMUSCULAR | Status: AC
  Administered 2019-06-16: 40 mg via INTRA_ARTICULAR

## 2019-06-16 NOTE — Progress Notes (Signed)
Larry Robertson - 78 y.o. male MRN [id number]  Date of birth: 08/14/1941  SUBJECTIVE:  Including CC & ROS.  Chief Complaint  Patient presents with  . Knee Pain    bilateral knee    Larry Robertson is a 78 y.o. male that is presenting with acute on chronic bilateral knee pain.  The pain is been occurring for 3 years.  He denies any specific inciting event.  The pain is intermittent in nature.  Is becoming more constant.  He feels the pain over the medial joint line in each knee.  Denies any mechanical symptoms.  Denies any history of surgery.  The pain can be moderate to severe.  The pain is sharp and throbbing.  It seems to be worse with more walking.  It is localized to the knee.  Has not had any improvement with modalities to date.  Denies any history of injections.    Review of Systems  Constitutional: Negative for fever.  HENT: Negative for congestion.   Respiratory: Negative for cough.   Cardiovascular: Negative for chest pain.  Gastrointestinal: Negative for abdominal pain.  Musculoskeletal: Positive for arthralgias.  Skin: Negative for color change.  Neurological: Negative for weakness.  Hematological: Negative for adenopathy.    HISTORY: Past Medical, Surgical, Social, and Family History Reviewed & Updated per EMR.   Pertinent Historical Findings include:  Past Medical History:  Diagnosis Date  . Arthritis   . Blood transfusion without reported diagnosis   . Colon polyps 05/19/2012  . 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 DIABETES MELLITUS, TYPE II 08/25/2007  . Diastolic dysfunction    on echo  . Diverticulosis   . ERECTILE DYSFUNCTION 08/25/2007  . 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 Heart murmur    but was not stated by DR Hilty  . Hepatitis    took treatment  . HEPATITIS C 08/25/2007  . HYPERTENSION 08/25/2007  . 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 HYPOGONADISM 09/27/2009    Past Surgical History:  Procedure Laterality Date  .  ABDOMINAL HERNIA REPAIR  2000  . ANTERIOR CERVICAL DECOMP/DISCECTOMY FUSION Right 03/04/2018   Procedure: Cervical Three-Four, Cervical Six-Seven Right Side approach, Anterior cervical decompression/discectomy/fusion;  Surgeon: Erline Levine, MD;  Location: [facility];  Service: Neurosurgery;  Laterality: Right;  right side anterior approach  . CHOLECYSTECTOMY  1999  . COLONOSCOPY    . LUMBAR FUSION  2005  . TONSILLECTOMY AND ADENOIDECTOMY    . UPPER GASTROINTESTINAL ENDOSCOPY      Allergies  Allergen Reactions  . Lyrica [Pregabalin] Other (See Comments)    Blurred vision  . Gabapentin Rash and Other (See Comments)    Diplopia (double vision)    Family History  Problem Relation Age of Onset  . Stroke Mother   . Diabetes Maternal Grandmother   . Cancer Neg Hx   . Colon cancer Neg Hx   . Esophageal cancer Neg Hx   . Rectal cancer Neg Hx   . Stomach cancer Neg Hx      Social History   Socioeconomic History  . Marital status: Single    Spouse name: Not on file  . Number of children: 7  . Years of education: Not on file  . Highest education level: Not on file  Occupational History  . 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 Financial resource strain: Not on file  . Food insecurity    Worry: Not on file  Inability: Not on file  . Transportation needs    Medical: Not on file    Non-medical: Not on file  Tobacco Use  . Smoking status: Never Smoker  . Smokeless tobacco: Never Used  Substance and Sexual Activity  . Alcohol use: Yes    Comment: 3-4 beers per week; no alcohol per pt as of 02-09-17 visit  . Drug use: No  . Sexual activity: Not on file  Lifestyle  . Physical activity    Days per week: Not on file    Minutes per session: Not on file  . Stress: Not on file  Relationships  . Social Herbalist on phone: Not on file    Gets together: Not on file    Attends religious service: Not on file    Active member of club or  organization: Not on file    Attends meetings of clubs or organizations: Not on file    Relationship status: Not on file  . Intimate partner violence    Fear of current or ex partner: Not on file    Emotionally abused: Not on file    Physically abused: Not on file    Forced sexual activity: Not on file  Other Topics Concern  . Not on file  Social History Narrative   Divorced     PHYSICAL EXAM:  VS: BP (!) 143/82   Pulse 70   Ht 6\' 1"  (1.854 m)   Wt 217 lb (98.4 kg)   BMI 28.63 kg/m  Physical Exam Gen: NAD, alert, cooperative with exam, well-appearing ENT: normal lips, normal nasal mucosa,  Eye: normal EOM, normal conjunctiva and lids CV:  no edema, +2 pedal pulses   Resp: no accessory muscle use, non-labored,  Skin: no rashes, no areas of induration  Neuro: normal tone, normal sensation to touch Psych:  normal insight, alert and oriented MSK:  Right and left knee:  No obvious effusion. Normal range of motion. Normal strength resistance. Instability with valgus and varus stress testing. No pain with patellar grind. Negative Murray's test. Neurovascular intact   Aspiration/Injection Procedure Note Larry Robertson 03/27/1941  Procedure: Injection Indications: Right knee pain  Procedure Details Consent: Risks of procedure as well as the alternatives and risks of each were explained to the (patient/caregiver).  Consent for procedure obtained. Time Out: Verified patient identification, verified procedure, site/side was marked, verified correct patient position, special equipment/implants available, medications/allergies/relevent history reviewed, required imaging and test results available.  Performed.  The area was cleaned with iodine and alcohol swabs.    The right knee superior lateral suprapatellar pouch was injected using 1 cc's of 40 mg Kenalog and 4 cc's of 0.25% bupivacaine with a 22 1 1/2" needle.  Ultrasound was used. Images were obtained in long views showing  the injection.     A sterile dressing was applied.  Patient did tolerate procedure well.   Aspiration/Injection Procedure Note Larry Robertson 18-Dec-1940  Procedure: Injection Indications: Left knee pain  Procedure Details Consent: Risks of procedure as well as the alternatives and risks of each were explained to the (patient/caregiver).  Consent for procedure obtained. Time Out: Verified patient identification, verified procedure, site/side was marked, verified correct patient position, special equipment/implants available, medications/allergies/relevent history reviewed, required imaging and test results available.  Performed.  The area was cleaned with iodine and alcohol swabs.    The left knee superior lateral suprapatellar pouch was injected using 1 cc's of 40 mg Kenalog and 4 cc's  of 0.25% bupivacaine with a 22 1 1/2" needle.  Ultrasound was used. Images were obtained in long views showing the injection.     A sterile dressing was applied.  Patient did tolerate procedure well.       ASSESSMENT & PLAN:   Chronic pain of both knees Pain most likely associated with degenerative changes.  May be associated with underlying gout. -Injections today. -X-rays. -Counseled on supportive care and home exercise therapy. -If no improvement consider gel injections

## 2019-06-16 NOTE — Patient Instructions (Signed)
Nice to meet you Please try the exercises  Please try ice  Please try over the counter volarten gel   Please send me a message in [place] with any questions or updates.  Please see me back in 4 weeks.  I will call you with the results from today.   --Dr. Raeford Razor

## 2019-06-17 ENCOUNTER — Telehealth: Payer: Self-pay | Admitting: Family Medicine

## 2019-06-17 DIAGNOSIS — M179 Osteoarthritis of knee, unspecified: Secondary | ICD-10-CM | POA: Insufficient documentation

## 2019-06-17 DIAGNOSIS — M1710 Unilateral primary osteoarthritis, unspecified knee: Secondary | ICD-10-CM | POA: Insufficient documentation

## 2019-06-17 NOTE — Telephone Encounter (Signed)
Informed patient of results. May try gel injections going forward.   Rosemarie Ax, MD [facility] 06/17/2019, 11:59 AM

## 2019-06-17 NOTE — Assessment & Plan Note (Signed)
Pain most likely associated with degenerative changes.  May be associated with underlying gout. -Injections today. -X-rays. -Counseled on supportive care and home exercise therapy. -If no improvement consider gel injections

## 2019-06-22 ENCOUNTER — Other Ambulatory Visit: Payer: Self-pay | Admitting: Family Medicine

## 2019-06-23 NOTE — Telephone Encounter (Signed)
Needs to return to discuss treatment of elevated uric acid.

## 2019-07-06 ENCOUNTER — Encounter: Payer: Medicare Other | Attending: Physical Medicine & Rehabilitation | Admitting: Registered Nurse

## 2019-07-06 ENCOUNTER — Other Ambulatory Visit: Payer: Self-pay

## 2019-07-06 ENCOUNTER — Encounter: Payer: Self-pay | Admitting: Registered Nurse

## 2019-07-06 ENCOUNTER — Other Ambulatory Visit: Payer: Self-pay | Admitting: Family Medicine

## 2019-07-06 VITALS — BP 130/78 | HR 82 | Temp 97.7°F | Resp 18 | Ht 73.0 in | Wt 216.2 lb

## 2019-07-06 DIAGNOSIS — M5416 Radiculopathy, lumbar region: Secondary | ICD-10-CM | POA: Diagnosis not present

## 2019-07-06 DIAGNOSIS — M25561 Pain in right knee: Secondary | ICD-10-CM

## 2019-07-06 DIAGNOSIS — E119 Type 2 diabetes mellitus without complications: Secondary | ICD-10-CM | POA: Diagnosis not present

## 2019-07-06 DIAGNOSIS — M24512 Contracture, left shoulder: Secondary | ICD-10-CM

## 2019-07-06 DIAGNOSIS — G894 Chronic pain syndrome: Secondary | ICD-10-CM

## 2019-07-06 DIAGNOSIS — M542 Cervicalgia: Secondary | ICD-10-CM

## 2019-07-06 DIAGNOSIS — G8929 Other chronic pain: Secondary | ICD-10-CM | POA: Insufficient documentation

## 2019-07-06 DIAGNOSIS — I10 Essential (primary) hypertension: Secondary | ICD-10-CM | POA: Diagnosis not present

## 2019-07-06 DIAGNOSIS — Z5181 Encounter for therapeutic drug level monitoring: Secondary | ICD-10-CM

## 2019-07-06 DIAGNOSIS — M961 Postlaminectomy syndrome, not elsewhere classified: Secondary | ICD-10-CM | POA: Diagnosis not present

## 2019-07-06 DIAGNOSIS — Z79891 Long term (current) use of opiate analgesic: Secondary | ICD-10-CM

## 2019-07-06 DIAGNOSIS — Z9889 Other specified postprocedural states: Secondary | ICD-10-CM | POA: Diagnosis not present

## 2019-07-06 DIAGNOSIS — M25519 Pain in unspecified shoulder: Secondary | ICD-10-CM | POA: Diagnosis not present

## 2019-07-06 DIAGNOSIS — M25562 Pain in left knee: Secondary | ICD-10-CM

## 2019-07-06 MED ORDER — OXYCODONE-ACETAMINOPHEN 10-325 MG PO TABS: 1.0000 | ORAL_TABLET | Freq: Three times a day (TID) | ORAL | 0 refills | Status: DC | PRN

## 2019-07-06 NOTE — Progress Notes (Signed)
Subjective:    Patient ID: Larry Robertson, male    DOB: 1941/05/06, 78 y.o.   MRN: [id number]  HPI: Larry Robertson is a 78 y.o. male who returns for follow up appointment for chronic pain and medication refill. He states his pain is located in his neck, left shoulder,lower back radiating into his bilateral lower extremities and bilateral knee pain. He rates his pain 8. His current exercise regime is walking, performing stretching exercises and lifting light weights. .  Larry Robertson Morphine equivalent is 45.00  MME.  Last UDS was on 05/09/2019, it was consistent.   Pain Inventory Average Pain 7 Pain Right Now 8 My pain is aching  In the last 24 hours, has pain interfered with the following? General activity 7 Relation with others 7 Enjoyment of life 8 What TIME of day is your pain at its worst? all Sleep (in general) Fair  Pain is worse with: na Pain improves with: medication Relief from Meds: 7  Mobility walk without assistance how many minutes can you walk? 20 ability to climb steps?  yes do you drive?  yes  Function not employed: date last employed na  Neuro/Psych No problems in this area  Prior Studies Any changes since last visit?  no  Physicians involved in your care Any changes since last visit?  no   Family History  Problem Relation Age of Onset  . Stroke Mother   . Diabetes Maternal Grandmother   . Cancer Neg Hx   . Colon cancer Neg Hx   . Esophageal cancer Neg Hx   . Rectal cancer Neg Hx   . Stomach cancer Neg Hx    Social History   Socioeconomic History  . Marital status: Single    Spouse name: Not on file  . Number of children: 7  . Years of education: Not on file  . Highest education level: Not on file  Occupational History  . 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 Financial resource strain: Not on file  . Food insecurity    Worry: Not on file    Inability: Not on file  . Transportation needs   Medical: Not on file    Non-medical: Not on file  Tobacco Use  . Smoking status: Never Smoker  . Smokeless tobacco: Never Used  Substance and Sexual Activity  . Alcohol use: Yes    Comment: 3-4 beers per week; no alcohol per pt as of 02-09-17 visit  . Drug use: No  . Sexual activity: Not on file  Lifestyle  . Physical activity    Days per week: Not on file    Minutes per session: Not on file  . Stress: Not on file  Relationships  . Social Herbalist on phone: Not on file    Gets together: Not on file    Attends religious service: Not on file    Active member of club or organization: Not on file    Attends meetings of clubs or organizations: Not on file    Relationship status: Not on file  Other Topics Concern  . Not on file  Social History Narrative   Divorced   Past Surgical History:  Procedure Laterality Date  . ABDOMINAL HERNIA REPAIR  2000  . ANTERIOR CERVICAL DECOMP/DISCECTOMY FUSION Right 03/04/2018   Procedure: Cervical Three-Four, Cervical Six-Seven Right Side approach, Anterior cervical decompression/discectomy/fusion;  Surgeon: Erline Levine, MD;  Location: [place];  Service:  Neurosurgery;  Laterality: Right;  right side anterior approach  . CHOLECYSTECTOMY  1999  . COLONOSCOPY    . LUMBAR FUSION  2005  . TONSILLECTOMY AND ADENOIDECTOMY    . UPPER GASTROINTESTINAL ENDOSCOPY     Past Medical History:  Diagnosis Date  . Arthritis   . Blood transfusion without reported diagnosis   . Colon polyps 05/19/2012  . 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 DIABETES MELLITUS, TYPE II 08/25/2007  . Diastolic dysfunction    on echo  . Diverticulosis   . ERECTILE DYSFUNCTION 08/25/2007  . 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 Heart murmur    but was not stated by DR Hilty  . Hepatitis    took treatment  . HEPATITIS C 08/25/2007  . HYPERTENSION 08/25/2007  . 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 HYPOGONADISM 09/27/2009   There were no  vitals taken for this visit.  Opioid Risk Score:   Fall Risk Score:  `1  Depression screen PHQ 2/9  Depression screen [facility] 2/9 03/09/2019 02/09/2019 01/11/2019 11/08/2018 11/05/2018 10/18/2018  Decreased Interest 0 0 0 0 0 0  Down, Depressed, Hopeless - 0 0 0 0 0  PHQ - 2 Score 0 0 0 0 0 0  Altered sleeping - - - - - 0  Tired, decreased energy - - - - - 2  Change in appetite - - - - - 0  Feeling bad or failure about yourself  - - - - - 0  Trouble concentrating - - - - - 0  Moving slowly or fidgety/restless - - - - - 0  Suicidal thoughts - - - - - 0  PHQ-9 Score - - - - - 2     Review of Systems  Constitutional: Negative.   HENT: Negative.   Eyes: Negative.   Respiratory: Negative.   Cardiovascular: Negative.   Gastrointestinal: Negative.   Endocrine:       High Blood Sugar  Genitourinary: Negative.   Musculoskeletal: Positive for back pain.  Skin: Negative.   Allergic/Immunologic: Negative.   Neurological: Negative.   Psychiatric/Behavioral: Negative.   All other systems reviewed and are negative.      Objective:   Physical Exam Vitals signs and nursing note reviewed.  Constitutional:      Appearance: Normal appearance.  Neck:     Musculoskeletal: Normal range of motion and neck supple.  Cardiovascular:     Rate and Rhythm: Normal rate and regular rhythm.     Pulses: Normal pulses.     Heart sounds: Normal heart sounds.  Pulmonary:     Effort: Pulmonary effort is normal.     Breath sounds: Normal breath sounds.  Musculoskeletal:     Comments: Normal Muscle Bulk and Muscle Testing Reveals:  Upper Extremities: Right: Full ROM and Muscle Strength 5/5 Left: Decreased ROM 35 Degrees and Muscle Strength 4/5 Lumbar Paraspinal Tenderness: L-3-L-5 Lower Extremities: Full ROM and Muscle Strength 5/5 Arises from chair slowly Antalgic Gait   Skin:    General: Skin is warm and dry.  Neurological:     Mental Status: He is alert and oriented to person, place, and time.   Psychiatric:        Mood and Affect: Mood normal.        Behavior: Behavior normal.           Assessment & Plan:  1.Cervical Postlaminectomy Syndrome/ Cervicalgia: Continue HEP as Tolerated. Continue to Monitor.07/06/2019. 2. Contracture of Left Shoulder Joint Region: Continue  HEP as Tolerated.07/06/2019. 3. Chronic Low Back Pain/ Lumbar Radiculitis: Continue HEP as Tolerated. Continue to Monitor.07/06/2019 4. Chronic Pain Syndrome:Refilled:Oxycodone 10/325 mg one tablet three times a day as needed for pain #90.06/08/2019 We will continue the opioid monitoring program, this consists of regular clinic visits, examinations, urine drug screen, pill counts as well as use of [place] Controlled Substance Reporting system.  70minutes of face to face patient care time was spent during this visit. All questions were encouraged and answered.  F/U in 1 month

## 2019-07-14 ENCOUNTER — Ambulatory Visit (INDEPENDENT_AMBULATORY_CARE_PROVIDER_SITE_OTHER): Payer: Medicare Other | Admitting: Family Medicine

## 2019-07-14 ENCOUNTER — Other Ambulatory Visit: Payer: Self-pay

## 2019-07-14 DIAGNOSIS — M170 Bilateral primary osteoarthritis of knee: Secondary | ICD-10-CM

## 2019-07-14 DIAGNOSIS — M109 Gout, unspecified: Secondary | ICD-10-CM

## 2019-07-14 NOTE — Progress Notes (Signed)
Medication Samples have been provided to the patient.  Drug name: pensaid       Strength: 2%        Qty: 2 LOT: [id number]  Exp.Date2/21  Dosing instructions: peasize amt to affected area  The patient has been instructed regarding the correct time, dose, and frequency of taking this medication, including desired effects and most common side effects.   Larry Robertson 11:00 AM 07/14/2019    Medication Samples have been provided to the patient.  Drug name: rayos      Strength:5mg        Qty:1  LOT: [id number]  Exp.Date: 5/21  Dosing instructions: 2 tabs qhs  The patient has been instructed regarding the correct time, dose, and frequency of taking this medication, including desired effects and most common side effects.   Larry Robertson 11:00 AM 07/14/2019

## 2019-07-14 NOTE — Assessment & Plan Note (Signed)
Most recent uric acid was 9.2.  May be contributing to his knee pain.  Would consider allopurinol.  Does not appear to have any tophus change in his joints.  Getting lower than 6 would help with any degenerative changes. -Consider initiating allopurinol. -Consider obtaining uric acid

## 2019-07-14 NOTE — Progress Notes (Signed)
Larry Robertson - 78 y.o. male MRN [id number]  Date of birth: 08/30/1941  SUBJECTIVE:  Including CC & ROS.  No chief complaint on file.   KHOEN GENET is a 78 y.o. male that is following up for his bilateral knee pain.  He received bilateral injections of steroid on 7/16.  He had very minor improvement of his knee pain.  The pain is still constant and ongoing on a daily basis.  It throbs and worse at night.  Also worse with any ambulation.  It is localized to the knee.  Denies any mechanical symptoms.  Pain is moderate to severe.  Independent review of the right and left knee x-ray from 7/16 shows severe degenerative changes of the patellofemoral compartment bilaterally   Review of Systems  Constitutional: Negative for fever.  HENT: Negative for congestion.   Respiratory: Negative for cough.   Cardiovascular: Negative for chest pain.  Gastrointestinal: Negative for abdominal distention.  Musculoskeletal: Positive for arthralgias and joint swelling.  Skin: Negative for color change.  Neurological: Negative for weakness.  Hematological: Negative for adenopathy.    HISTORY: Past Medical, Surgical, Social, and Family History Reviewed & Updated per EMR.   Pertinent Historical Findings include:  Past Medical History:  Diagnosis Date  . Arthritis   . Blood transfusion without reported diagnosis   . Colon polyps 05/19/2012  . 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 DIABETES MELLITUS, TYPE II 08/25/2007  . Diastolic dysfunction    on echo  . Diverticulosis   . ERECTILE DYSFUNCTION 08/25/2007  . 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 Heart murmur    but was not stated by DR Hilty  . Hepatitis    took treatment  . HEPATITIS C 08/25/2007  . HYPERTENSION 08/25/2007  . 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 HYPOGONADISM 09/27/2009    Past Surgical History:  Procedure Laterality Date  . ABDOMINAL HERNIA REPAIR  2000  . ANTERIOR CERVICAL DECOMP/DISCECTOMY FUSION  Right 03/04/2018   Procedure: Cervical Three-Four, Cervical Six-Seven Right Side approach, Anterior cervical decompression/discectomy/fusion;  Surgeon: Erline Levine, MD;  Location: [place];  Service: Neurosurgery;  Laterality: Right;  right side anterior approach  . CHOLECYSTECTOMY  1999  . COLONOSCOPY    . LUMBAR FUSION  2005  . TONSILLECTOMY AND ADENOIDECTOMY    . UPPER GASTROINTESTINAL ENDOSCOPY      Allergies  Allergen Reactions  . Lyrica [Pregabalin] Other (See Comments)    Blurred vision  . Gabapentin Rash and Other (See Comments)    Diplopia (double vision)    Family History  Problem Relation Age of Onset  . Stroke Mother   . Diabetes Maternal Grandmother   . Cancer Neg Hx   . Colon cancer Neg Hx   . Esophageal cancer Neg Hx   . Rectal cancer Neg Hx   . Stomach cancer Neg Hx      Social History   Socioeconomic History  . Marital status: Single    Spouse name: Not on file  . Number of children: 7  . Years of education: Not on file  . Highest education level: Not on file  Occupational History  . 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 Financial resource strain: Not on file  . Food insecurity    Worry: Not on file    Inability: Not on file  . Transportation needs    Medical: Not on file    Non-medical: Not on file  Tobacco Use  . Smoking status: Never Smoker  . Smokeless tobacco: Never Used  Substance and Sexual Activity  . Alcohol use: Yes    Comment: 3-4 beers per week; no alcohol per pt as of 02-09-17 visit  . Drug use: No  . Sexual activity: Not on file  Lifestyle  . Physical activity    Days per week: Not on file    Minutes per session: Not on file  . Stress: Not on file  Relationships  . Social Herbalist on phone: Not on file    Gets together: Not on file    Attends religious service: Not on file    Active member of club or organization: Not on file    Attends meetings of clubs or organizations: Not  on file    Relationship status: Not on file  . Intimate partner violence    Fear of current or ex partner: Not on file    Emotionally abused: Not on file    Physically abused: Not on file    Forced sexual activity: Not on file  Other Topics Concern  . Not on file  Social History Narrative   Divorced     PHYSICAL EXAM:  VS: BP (!) 167/90   Ht 6\' 1"  (1.854 m)   Wt 217 lb (98.4 kg)   BMI 28.63 kg/m  Physical Exam Gen: NAD, alert, cooperative with exam, well-appearing ENT: normal lips, normal nasal mucosa,  Eye: normal EOM, normal conjunctiva and lids CV:  no edema, +2 pedal pulses   Resp: no accessory muscle use, non-labored,   Skin: no rashes, no areas of induration  Neuro: normal tone, normal sensation to touch Psych:  normal insight, alert and oriented MSK:  Left and right knee: No obvious effusion. Tenderness palpation over the lateral patella. Normal range of motion. Normal strength resistance. Instability with valgus and varus stress testing. Negative McMurray's test. Neurovascular intact     ASSESSMENT & PLAN:   OA (osteoarthritis) of knee Severe degenerative changes over the patellofemoral joint.  Has had limited improvement with the steroid injection.  May be related to underlying gouty change. -We will pursue gel injections. -Provided sample of Pennsaid and Rayos -Counseled on home exercise therapy and supportive care. -May need to start on allopurinol with last uric acid at  Gouty arthropathy Most recent uric acid was 9.2.  May be contributing to his knee pain.  Would consider allopurinol.  Does not appear to have any tophus change in his joints.  Getting lower than 6 would help with any degenerative changes. -Consider initiating allopurinol. -Consider obtaining uric acid

## 2019-07-14 NOTE — Assessment & Plan Note (Signed)
Severe degenerative changes over the patellofemoral joint.  Has had limited improvement with the steroid injection.  May be related to underlying gouty change. -We will pursue gel injections. -Provided sample of Pennsaid and Rayos -Counseled on home exercise therapy and supportive care. -May need to start on allopurinol with last uric acid at

## 2019-07-14 NOTE — Patient Instructions (Signed)
Good to see you Please use tylenol and vitamin D  Please use ice  Please take two pills of the Rayos as close to 10 pm as possible. For 4 days.  Please try the pennsaid. Rub a peasize on the knee two times daily  Please send me a message in MyChart with any questions or updates.  We will call you once the gel injections are approved by your insurance.   --Dr. Raeford Razor

## 2019-07-21 NOTE — Progress Notes (Signed)
Larry Robertson - 78 y.o. male MRN [id number]  Date of birth: [birth year]  SUBJECTIVE:  Including CC & ROS.  No chief complaint on file.   Larry Robertson is a 78 y.o. male that is presenting for the first injection in his viscosupplementation series.    Review of Systems  HISTORY: Past Medical, Surgical, Social, and Family History Reviewed & Updated per EMR.   Pertinent Historical Findings include:  Past Medical History:  Diagnosis Date  . Arthritis   . Blood transfusion without reported diagnosis   . Colon polyps 05/19/2012  . 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 DIABETES MELLITUS, TYPE II 08/25/2007  . Diastolic dysfunction    on echo  . Diverticulosis   . ERECTILE DYSFUNCTION 08/25/2007  . 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 Heart murmur    but was not stated by DR Hilty  . Hepatitis    took treatment  . HEPATITIS C 08/25/2007  . HYPERTENSION 08/25/2007  . 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 HYPOGONADISM 09/27/2009    Past Surgical History:  Procedure Laterality Date  . ABDOMINAL HERNIA REPAIR  2000  . ANTERIOR CERVICAL DECOMP/DISCECTOMY FUSION Right 03/04/2018   Procedure: Cervical Three-Four, Cervical Six-Seven Right Side approach, Anterior cervical decompression/discectomy/fusion;  Surgeon: Erline Levine, MD;  Location: [place];  Service: Neurosurgery;  Laterality: Right;  right side anterior approach  . CHOLECYSTECTOMY  1999  . COLONOSCOPY    . LUMBAR FUSION  2005  . TONSILLECTOMY AND ADENOIDECTOMY    . UPPER GASTROINTESTINAL ENDOSCOPY      Allergies  Allergen Reactions  . Lyrica [Pregabalin] Other (See Comments)    Blurred vision  . Gabapentin Rash and Other (See Comments)    Diplopia (double vision)    Family History  Problem Relation Age of Onset  . Stroke Mother   . Diabetes Maternal Grandmother   . Cancer Neg Hx   . Colon cancer Neg Hx   . Esophageal cancer Neg Hx   . Rectal cancer Neg Hx   . Stomach cancer Neg Hx       Social History   Socioeconomic History  . Marital status: Single    Spouse name: Not on file  . Number of children: 7  . Years of education: Not on file  . Highest education level: Not on file  Occupational History  . 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 Financial resource strain: Not on file  . Food insecurity    Worry: Not on file    Inability: Not on file  . Transportation needs    Medical: Not on file    Non-medical: Not on file  Tobacco Use  . Smoking status: Never Smoker  . Smokeless tobacco: Never Used  Substance and Sexual Activity  . Alcohol use: Yes    Comment: 3-4 beers per week; no alcohol per pt as of 02-09-17 visit  . Drug use: No  . Sexual activity: Not on file  Lifestyle  . Physical activity    Days per week: Not on file    Minutes per session: Not on file  . Stress: Not on file  Relationships  . Social Herbalist on phone: Not on file    Gets together: Not on file    Attends religious service: Not on file    Active member of club or organization: Not on file    Attends meetings of clubs or organizations:  Not on file    Relationship status: Not on file  . Intimate partner violence    Fear of current or ex partner: Not on file    Emotionally abused: Not on file    Physically abused: Not on file    Forced sexual activity: Not on file  Other Topics Concern  . Not on file  Social History Narrative   Divorced     PHYSICAL EXAM:  VS: There were no vitals taken for this visit. Physical Exam Gen: NAD, alert, cooperative with exam, well-appearing   Aspiration/Injection Procedure Note Larry Robertson Jul 01, 1941  Procedure: Injection Indications: left knee pain   Procedure Details Consent: Risks of procedure as well as the alternatives and risks of each were explained to the (patient/caregiver).  Consent for procedure obtained. Time Out: Verified patient identification, verified procedure, site/side  was marked, verified correct patient position, special equipment/implants available, medications/allergies/relevent history reviewed, required imaging and test results available.  Performed.  The area was cleaned with iodine and alcohol swabs.    The left knee superior lateral suprapatellar pouch was injected using 4 cc's of 1% lidocaine with a 22 1 1/2" needle.  The syringe was switched and a 16.8 mg/2 mL of Gelsyn 3 was injected. Ultrasound was used. Images were obtained in  Long views showing the injection.    A sterile dressing was applied.  Patient did tolerate procedure well.   Aspiration/Injection Procedure Note Larry Robertson 1941-06-07  Procedure: Injection Indications: right knee pain   Procedure Details Consent: Risks of procedure as well as the alternatives and risks of each were explained to the (patient/caregiver).  Consent for procedure obtained. Time Out: Verified patient identification, verified procedure, site/side was marked, verified correct patient position, special equipment/implants available, medications/allergies/relevent history reviewed, required imaging and test results available.  Performed.  The area was cleaned with iodine and alcohol swabs.    The right knee superior lateral suprapatellar pouch was injected using 4 cc's of 1% lidocaine with a 22 1 1/2" needle.  The syringe was switched and a 16.8 mg/2 mL of Gelsyn 3 was injected. Ultrasound was used. Images were obtained in  Long views showing the injection.    A sterile dressing was applied.  Patient did tolerate procedure well.      ASSESSMENT & PLAN:   OA (osteoarthritis) of knee Presented for first Gelsyn3 injections  - f/u in one week.

## 2019-07-22 ENCOUNTER — Ambulatory Visit: Payer: Self-pay

## 2019-07-22 ENCOUNTER — Ambulatory Visit (INDEPENDENT_AMBULATORY_CARE_PROVIDER_SITE_OTHER): Payer: Medicare Other | Admitting: Family Medicine

## 2019-07-22 ENCOUNTER — Other Ambulatory Visit: Payer: Self-pay

## 2019-07-22 DIAGNOSIS — M170 Bilateral primary osteoarthritis of knee: Secondary | ICD-10-CM

## 2019-07-22 NOTE — Assessment & Plan Note (Signed)
Presented for first Gelsyn3 injections  - f/u in one week.

## 2019-07-22 NOTE — Patient Instructions (Signed)
Good to see you Please try to ice Please use tylenol   Please send me a message in MyChart with any questions or updates.  Please see me back in 1 week.   --Dr. Raeford Razor

## 2019-08-01 ENCOUNTER — Other Ambulatory Visit: Payer: Self-pay

## 2019-08-01 ENCOUNTER — Ambulatory Visit (INDEPENDENT_AMBULATORY_CARE_PROVIDER_SITE_OTHER): Payer: Medicare Other | Admitting: Family Medicine

## 2019-08-01 ENCOUNTER — Ambulatory Visit: Payer: Self-pay

## 2019-08-01 DIAGNOSIS — M170 Bilateral primary osteoarthritis of knee: Secondary | ICD-10-CM | POA: Diagnosis not present

## 2019-08-01 NOTE — Progress Notes (Signed)
SNEIJDER STREY - 78 y.o. male MRN [id number]  Date of birth: Dec 23, 1940  SUBJECTIVE:  Including CC & ROS.  No chief complaint on file.   GRANT WOODRUM is a 78 y.o. male that is presenting for his second gel injection in the series of 3..    Review of Systems  HISTORY: Past Medical, Surgical, Social, and Family History Reviewed & Updated per EMR.   Pertinent Historical Findings include:  Past Medical History:  Diagnosis Date  . Arthritis   . Blood transfusion without reported diagnosis   . Colon polyps 05/19/2012  . 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 DIABETES MELLITUS, TYPE II 08/25/2007  . Diastolic dysfunction    on echo  . Diverticulosis   . ERECTILE DYSFUNCTION 08/25/2007  . 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 Heart murmur    but was not stated by DR Hilty  . Hepatitis    took treatment  . HEPATITIS C 08/25/2007  . HYPERTENSION 08/25/2007  . 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 HYPOGONADISM 09/27/2009    Past Surgical History:  Procedure Laterality Date  . ABDOMINAL HERNIA REPAIR  2000  . ANTERIOR CERVICAL DECOMP/DISCECTOMY FUSION Right 03/04/2018   Procedure: Cervical Three-Four, Cervical Six-Seven Right Side approach, Anterior cervical decompression/discectomy/fusion;  Surgeon: Erline Levine, MD;  Location: [place];  Service: Neurosurgery;  Laterality: Right;  right side anterior approach  . CHOLECYSTECTOMY  1999  . COLONOSCOPY    . LUMBAR FUSION  2005  . TONSILLECTOMY AND ADENOIDECTOMY    . UPPER GASTROINTESTINAL ENDOSCOPY      Allergies  Allergen Reactions  . Lyrica [Pregabalin] Other (See Comments)    Blurred vision  . Gabapentin Rash and Other (See Comments)    Diplopia (double vision)    Family History  Problem Relation Age of Onset  . Stroke Mother   . Diabetes Maternal Grandmother   . Cancer Neg Hx   . Colon cancer Neg Hx   . Esophageal cancer Neg Hx   . Rectal cancer Neg Hx   . Stomach cancer Neg Hx      Social  History   Socioeconomic History  . Marital status: Single    Spouse name: Not on file  . Number of children: 7  . Years of education: Not on file  . Highest education level: Not on file  Occupational History  . 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 Financial resource strain: Not on file  . Food insecurity    Worry: Not on file    Inability: Not on file  . Transportation needs    Medical: Not on file    Non-medical: Not on file  Tobacco Use  . Smoking status: Never Smoker  . Smokeless tobacco: Never Used  Substance and Sexual Activity  . Alcohol use: Yes    Comment: 3-4 beers per week; no alcohol per pt as of 02-09-17 visit  . Drug use: No  . Sexual activity: Not on file  Lifestyle  . Physical activity    Days per week: Not on file    Minutes per session: Not on file  . Stress: Not on file  Relationships  . Social Herbalist on phone: Not on file    Gets together: Not on file    Attends religious service: Not on file    Active member of club or organization: Not on file    Attends meetings of clubs  or organizations: Not on file    Relationship status: Not on file  . Intimate partner violence    Fear of current or ex partner: Not on file    Emotionally abused: Not on file    Physically abused: Not on file    Forced sexual activity: Not on file  Other Topics Concern  . Not on file  Social History Narrative   Divorced     PHYSICAL EXAM:  VS: There were no vitals taken for this visit. Physical Exam Gen: NAD, alert, cooperative with exam, well-appearing   Aspiration/Injection Procedure Note USIEL ZUEGE 1941-05-12  Procedure: Injection Indications: right knee pain   Procedure Details Consent: Risks of procedure as well as the alternatives and risks of each were explained to the (patient/caregiver).  Consent for procedure obtained. Time Out: Verified patient identification, verified procedure, site/side was  marked, verified correct patient position, special equipment/implants available, medications/allergies/relevent history reviewed, required imaging and test results available.  Performed.  The area was cleaned with iodine and alcohol swabs.    The right knee superior lateral suprapatellar pouch was injected using 4 cc's of 1% lidocaine with a 22 1 1/2" needle.  The syringe was switched and a 16.8 mg/2 mL of Gelsyn 3 was injected. Ultrasound was used. Images were obtained in  Long views showing the injection.    A sterile dressing was applied.  Patient did tolerate procedure well.   Aspiration/Injection Procedure Note CHARVEZ PRIVOTT 02-12-1941  Procedure: Injection Indications: left knee pain   Procedure Details Consent: Risks of procedure as well as the alternatives and risks of each were explained to the (patient/caregiver).  Consent for procedure obtained. Time Out: Verified patient identification, verified procedure, site/side was marked, verified correct patient position, special equipment/implants available, medications/allergies/relevent history reviewed, required imaging and test results available.  Performed.  The area was cleaned with iodine and alcohol swabs.    The left knee superior lateral suprapatellar pouch was injected using 4 cc's of 1% lidocaine with a 22 1 1/2" needle.  The syringe was switched and a 16.8 mg/2 mL of Gelsyn 3 was injected. Ultrasound was used. Images were obtained in  Long views showing the injection.     A sterile dressing was applied.  Patient did tolerate procedure well.     ASSESSMENT & PLAN:   OA (osteoarthritis) of knee Completed 2/3 of Gelsyn3 injections.  - f/u in one week.

## 2019-08-01 NOTE — Patient Instructions (Signed)
Good to see you Please try ice  Please send me a message in MyChart with any questions or updates.  Please see me back in 1 week.   --Dr. Jannice Beitzel  

## 2019-08-01 NOTE — Assessment & Plan Note (Signed)
Completed 2/3 of Gelsyn3 injections.  - f/u in one week.

## 2019-08-04 ENCOUNTER — Other Ambulatory Visit: Payer: Self-pay | Admitting: Endocrinology

## 2019-08-05 ENCOUNTER — Other Ambulatory Visit: Payer: Self-pay

## 2019-08-05 ENCOUNTER — Encounter: Payer: Medicare Other | Attending: Physical Medicine & Rehabilitation | Admitting: Registered Nurse

## 2019-08-05 ENCOUNTER — Encounter: Payer: Self-pay | Admitting: Registered Nurse

## 2019-08-05 VITALS — BP 131/79 | HR 68 | Temp 98.7°F | Ht 73.0 in | Wt 214.8 lb

## 2019-08-05 DIAGNOSIS — Z5181 Encounter for therapeutic drug level monitoring: Secondary | ICD-10-CM

## 2019-08-05 DIAGNOSIS — M5416 Radiculopathy, lumbar region: Secondary | ICD-10-CM

## 2019-08-05 DIAGNOSIS — I10 Essential (primary) hypertension: Secondary | ICD-10-CM | POA: Diagnosis not present

## 2019-08-05 DIAGNOSIS — E119 Type 2 diabetes mellitus without complications: Secondary | ICD-10-CM | POA: Diagnosis not present

## 2019-08-05 DIAGNOSIS — M25561 Pain in right knee: Secondary | ICD-10-CM

## 2019-08-05 DIAGNOSIS — M542 Cervicalgia: Secondary | ICD-10-CM

## 2019-08-05 DIAGNOSIS — M961 Postlaminectomy syndrome, not elsewhere classified: Secondary | ICD-10-CM

## 2019-08-05 DIAGNOSIS — M25519 Pain in unspecified shoulder: Secondary | ICD-10-CM | POA: Diagnosis not present

## 2019-08-05 DIAGNOSIS — M24512 Contracture, left shoulder: Secondary | ICD-10-CM

## 2019-08-05 DIAGNOSIS — G894 Chronic pain syndrome: Secondary | ICD-10-CM

## 2019-08-05 DIAGNOSIS — M25562 Pain in left knee: Secondary | ICD-10-CM

## 2019-08-05 DIAGNOSIS — G8929 Other chronic pain: Secondary | ICD-10-CM | POA: Diagnosis present

## 2019-08-05 DIAGNOSIS — Z79891 Long term (current) use of opiate analgesic: Secondary | ICD-10-CM

## 2019-08-05 DIAGNOSIS — Z9889 Other specified postprocedural states: Secondary | ICD-10-CM | POA: Insufficient documentation

## 2019-08-05 MED ORDER — OXYCODONE-ACETAMINOPHEN 10-325 MG PO TABS: 1.0000 | ORAL_TABLET | Freq: Three times a day (TID) | ORAL | 0 refills | Status: DC | PRN

## 2019-08-05 NOTE — Progress Notes (Signed)
Subjective:    Patient ID: Larry Robertson, male    DOB: 04/13/1941, 78 y.o.   MRN: [id number]  HPI: Larry Robertson is a 78 y.o. male who returns for follow up appointment for chronic pain and medication refill. He states his pain is located in his neck, left shoulder, lower back pain radiating into his bilateral lower extremities and bilateral knee pain. He rates his pain 7. His  current exercise regime is walking, using his [employer] daily for 15 minutes and elliptical for 5 minutes 2- 3 times a week.  Mr. Clyatt Morphine equivalent is 45.00 MME.  Last UDS was Performe on 05/09/2019, it was consistent.   Pain Inventory Average Pain 7 Pain Right Now 7 My pain is aching  In the last 24 hours, has pain interfered with the following? General activity 7 Relation with others 6 Enjoyment of life 7 What TIME of day is your pain at its worst? n/a Sleep (in general) Fair  Pain is worse with: n/a Pain improves with: n/a Relief from Meds: 7  Mobility walk without assistance how many minutes can you walk? 15 ability to climb steps?  yes do you drive?  yes  Function retired  Neuro/Psych trouble walking  Prior Studies Any changes since last visit?  no  Physicians involved in your care Any changes since last visit?  no   Family History  Problem Relation Age of Onset  . Stroke Mother   . Diabetes Maternal Grandmother   . Cancer Neg Hx   . Colon cancer Neg Hx   . Esophageal cancer Neg Hx   . Rectal cancer Neg Hx   . Stomach cancer Neg Hx    Social History   Socioeconomic History  . Marital status: Single    Spouse name: Not on file  . Number of children: 7  . Years of education: Not on file  . Highest education level: Not on file  Occupational History  . 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 Financial resource strain: Not on file  . Food insecurity    Worry: Not on file    Inability: Not on file  . Transportation needs   Medical: Not on file    Non-medical: Not on file  Tobacco Use  . Smoking status: Never Smoker  . Smokeless tobacco: Never Used  Substance and Sexual Activity  . Alcohol use: Yes    Comment: 3-4 beers per week; no alcohol per pt as of 02-09-17 visit  . Drug use: No  . Sexual activity: Not on file  Lifestyle  . Physical activity    Days per week: Not on file    Minutes per session: Not on file  . Stress: Not on file  Relationships  . Social Herbalist on phone: Not on file    Gets together: Not on file    Attends religious service: Not on file    Active member of club or organization: Not on file    Attends meetings of clubs or organizations: Not on file    Relationship status: Not on file  Other Topics Concern  . Not on file  Social History Narrative   Divorced   Past Surgical History:  Procedure Laterality Date  . ABDOMINAL HERNIA REPAIR  2000  . ANTERIOR CERVICAL DECOMP/DISCECTOMY FUSION Right 03/04/2018   Procedure: Cervical Three-Four, Cervical Six-Seven Right Side approach, Anterior cervical decompression/discectomy/fusion;  Surgeon: Erline Levine, MD;  Location: [facility];  Service: Neurosurgery;  Laterality: Right;  right side anterior approach  . CHOLECYSTECTOMY  1999  . COLONOSCOPY    . LUMBAR FUSION  2005  . TONSILLECTOMY AND ADENOIDECTOMY    . UPPER GASTROINTESTINAL ENDOSCOPY     Past Medical History:  Diagnosis Date  . Arthritis   . Blood transfusion without reported diagnosis   . Colon polyps 05/19/2012  . 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 DIABETES MELLITUS, TYPE II 08/25/2007  . Diastolic dysfunction    on echo  . Diverticulosis   . ERECTILE DYSFUNCTION 08/25/2007  . 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 Heart murmur    but was not stated by DR Hilty  . Hepatitis    took treatment  . HEPATITIS C 08/25/2007  . HYPERTENSION 08/25/2007  . 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 HYPOGONADISM 09/27/2009   There were no  vitals taken for this visit.  Opioid Risk Score:   Fall Risk Score:  `1  Depression screen PHQ 2/9  Depression screen [facility] 2/9 03/09/2019 02/09/2019 01/11/2019 11/08/2018 11/05/2018 10/18/2018  Decreased Interest 0 0 0 0 0 0  Down, Depressed, Hopeless - 0 0 0 0 0  PHQ - 2 Score 0 0 0 0 0 0  Altered sleeping - - - - - 0  Tired, decreased energy - - - - - 2  Change in appetite - - - - - 0  Feeling bad or failure about yourself  - - - - - 0  Trouble concentrating - - - - - 0  Moving slowly or fidgety/restless - - - - - 0  Suicidal thoughts - - - - - 0  PHQ-9 Score - - - - - 2    Review of Systems  Constitutional: Negative.   HENT: Negative.   Eyes: Negative.   Respiratory: Negative.   Cardiovascular: Negative.   Gastrointestinal: Negative.   Endocrine: Negative.   Genitourinary: Negative.   Musculoskeletal: Positive for gait problem.  Skin: Negative.   Allergic/Immunologic: Negative.   Hematological: Negative.   Psychiatric/Behavioral: Negative.   All other systems reviewed and are negative.      Objective:   Physical Exam Vitals signs and nursing note reviewed.  Constitutional:      Appearance: Normal appearance.  Neck:     Musculoskeletal: Normal range of motion and neck supple.  Cardiovascular:     Rate and Rhythm: Normal rate and regular rhythm.     Pulses: Normal pulses.     Heart sounds: Normal heart sounds.  Pulmonary:     Effort: Pulmonary effort is normal.     Breath sounds: Normal breath sounds.  Musculoskeletal:     Comments: Normal Muscle Bulk and Muscle Testing Reveals:  Upper Extremities: Right: Full ROM and Muscle Strength 5/5 Left: Decreased ROM 30 Degrees and Muscle Strength 3/5 Lumbar Paraspinal Tenderness: L-4-L-5 Lower Extremities: Full ROM and Muscle Strength 5/5 Arises from chair slowly Antalgic  Gait   Skin:    General: Skin is warm and dry.  Neurological:     Mental Status: He is alert and oriented to person, place, and time.   Psychiatric:        Mood and Affect: Mood normal.        Behavior: Behavior normal.           Assessment & Plan:  1.Cervical Postlaminectomy Syndrome/ Cervicalgia: Continue HEP as Tolerated. Continue to Monitor.08/05/2019. 2. Contracture of Left Shoulder Joint Region: Continue HEP as  Tolerated.08/05/2019. 3. Chronic Low Back Pain/ Lumbar Radiculitis: Continue HEP as Tolerated. Continue to Monitor.08/05/2019 4. Chronic Pain Syndrome:Refilled:Oxycodone 10/325 mg one tablet three times a day as needed for pain #90.06/08/2019 We will continue the opioid monitoring program, this consists of regular clinic visits, examinations, urine drug screen, pill counts as well as use of [place] Controlled Substance Reporting system.  19minutes of face to face patient care time was spent during this visit. All questions were encouraged and answered.  F/U in 1 month

## 2019-08-09 ENCOUNTER — Ambulatory Visit: Payer: Medicare Other | Admitting: Family Medicine

## 2019-08-09 NOTE — Progress Notes (Deleted)
Larry Robertson - 78 y.o. male MRN [id number]  Date of birth: 06/26/1941  SUBJECTIVE:  Including CC & ROS.  No chief complaint on file.   Larry Robertson is a 78 y.o. male that is  ***.  ***   Review of Systems  HISTORY: Past Medical, Surgical, Social, and Family History Reviewed & Updated per EMR.   Pertinent Historical Findings include:  Past Medical History:  Diagnosis Date  . Arthritis   . Blood transfusion without reported diagnosis   . Colon polyps 05/19/2012  . 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 DIABETES MELLITUS, TYPE II 08/25/2007  . Diastolic dysfunction    on echo  . Diverticulosis   . ERECTILE DYSFUNCTION 08/25/2007  . 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 Heart murmur    but was not stated by DR Hilty  . Hepatitis    took treatment  . HEPATITIS C 08/25/2007  . HYPERTENSION 08/25/2007  . 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 HYPOGONADISM 09/27/2009    Past Surgical History:  Procedure Laterality Date  . ABDOMINAL HERNIA REPAIR  2000  . ANTERIOR CERVICAL DECOMP/DISCECTOMY FUSION Right 03/04/2018   Procedure: Cervical Three-Four, Cervical Six-Seven Right Side approach, Anterior cervical decompression/discectomy/fusion;  Surgeon: Erline Levine, MD;  Location: [place];  Service: Neurosurgery;  Laterality: Right;  right side anterior approach  . CHOLECYSTECTOMY  1999  . COLONOSCOPY    . LUMBAR FUSION  2005  . TONSILLECTOMY AND ADENOIDECTOMY    . UPPER GASTROINTESTINAL ENDOSCOPY      Allergies  Allergen Reactions  . Lyrica [Pregabalin] Other (See Comments)    Blurred vision  . Gabapentin Rash and Other (See Comments)    Diplopia (double vision)    Family History  Problem Relation Age of Onset  . Stroke Mother   . Diabetes Maternal Grandmother   . Cancer Neg Hx   . Colon cancer Neg Hx   . Esophageal cancer Neg Hx   . Rectal cancer Neg Hx   . Stomach cancer Neg Hx      Social History   Socioeconomic History  . Marital status:  Single    Spouse name: Not on file  . Number of children: 7  . Years of education: Not on file  . Highest education level: Not on file  Occupational History  . 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 Financial resource strain: Not on file  . Food insecurity    Worry: Not on file    Inability: Not on file  . Transportation needs    Medical: Not on file    Non-medical: Not on file  Tobacco Use  . Smoking status: Never Smoker  . Smokeless tobacco: Never Used  Substance and Sexual Activity  . Alcohol use: Yes    Comment: 3-4 beers per week; no alcohol per pt as of 02-09-17 visit  . Drug use: No  . Sexual activity: Not on file  Lifestyle  . Physical activity    Days per week: Not on file    Minutes per session: Not on file  . Stress: Not on file  Relationships  . Social Herbalist on phone: Not on file    Gets together: Not on file    Attends religious service: Not on file    Active member of club or organization: Not on file    Attends meetings of clubs or organizations: Not on file  Relationship status: Not on file  . Intimate partner violence    Fear of current or ex partner: Not on file    Emotionally abused: Not on file    Physically abused: Not on file    Forced sexual activity: Not on file  Other Topics Concern  . Not on file  Social History Narrative   Divorced     PHYSICAL EXAM:  VS: There were no vitals taken for this visit. Physical Exam Gen: NAD, alert, cooperative with exam, well-appearing   Aspiration/Injection Procedure Note Larry Robertson 1941/06/25  Procedure: {aspiration/injection:3041535} Indications: ***  Procedure Details Consent: {consent:3041460} Time Out: Verified patient identification, verified procedure, site/side was marked, verified correct patient position, special equipment/implants available, medications/allergies/relevent history reviewed, required imaging and test results  available.  {time out performed:3041467}.  The area was cleaned with iodine and alcohol swabs.    The *** knee superior lateral suprapatellar pouch was injected using 4 cc's of 1% lidocaine with a 22 1 1/2" needle.  The syringe was switched and a 16.8 mg/2 mL of Gelsyn 3 was injected. Ultrasound was used. Images were obtained in  Long views showing the injection.    A sterile dressing was applied.  Patient {did/not:14019} tolerate procedure well.   Aspiration/Injection Procedure Note Larry Robertson [birth year]  Procedure: {aspiration/injection:3041535} Indications: ***  Procedure Details Consent: {consent:3041460} Time Out: Verified patient identification, verified procedure, site/side was marked, verified correct patient position, special equipment/implants available, medications/allergies/relevent history reviewed, required imaging and test results available.  {time out performed:3041467}.  The area was cleaned with iodine and alcohol swabs.    The *** knee superior lateral suprapatellar pouch was injected using 4 cc's of 1% lidocaine with a 22 1 1/2" needle.  The syringe was switched and a 16.8 mg/2 mL of Gelsyn 3 was injected. Ultrasound was used. Images were obtained in  Long views showing the injection.    A sterile dressing was applied.  Patient {did/not:14019} tolerate procedure well.     ASSESSMENT & PLAN:   No problem-specific Assessment & Plan notes found for this encounter.

## 2019-08-12 ENCOUNTER — Other Ambulatory Visit: Payer: Self-pay

## 2019-08-12 ENCOUNTER — Ambulatory Visit (INDEPENDENT_AMBULATORY_CARE_PROVIDER_SITE_OTHER): Payer: Medicare Other | Admitting: Family Medicine

## 2019-08-12 ENCOUNTER — Ambulatory Visit: Payer: Self-pay

## 2019-08-12 DIAGNOSIS — M170 Bilateral primary osteoarthritis of knee: Secondary | ICD-10-CM | POA: Diagnosis not present

## 2019-08-12 NOTE — Patient Instructions (Signed)
Good to see you Please try ice  Please try tylenol as needed   Please send me a message in MyChart with any questions or updates.  Please see me back in 4 weeks or as needed if doing better.   --Dr. Raeford Razor

## 2019-08-12 NOTE — Progress Notes (Signed)
KARSIN FRIEDLAND - 78 y.o. male MRN [id number]  Date of birth: Jan 04, 1941  SUBJECTIVE:  Including CC & ROS.  No chief complaint on file.   DAXEN SCOLLON is a 78 y.o. male that is following up for his third injection of the [place] series.    Review of Systems  HISTORY: Past Medical, Surgical, Social, and Family History Reviewed & Updated per EMR.   Pertinent Historical Findings include:  Past Medical History:  Diagnosis Date  . Arthritis   . Blood transfusion without reported diagnosis   . Colon polyps 05/19/2012  . 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 DIABETES MELLITUS, TYPE II 08/25/2007  . Diastolic dysfunction    on echo  . Diverticulosis   . ERECTILE DYSFUNCTION 08/25/2007  . 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 Heart murmur    but was not stated by DR Hilty  . Hepatitis    took treatment  . HEPATITIS C 08/25/2007  . HYPERTENSION 08/25/2007  . 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 HYPOGONADISM 09/27/2009    Past Surgical History:  Procedure Laterality Date  . ABDOMINAL HERNIA REPAIR  2000  . ANTERIOR CERVICAL DECOMP/DISCECTOMY FUSION Right 03/04/2018   Procedure: Cervical Three-Four, Cervical Six-Seven Right Side approach, Anterior cervical decompression/discectomy/fusion;  Surgeon: Erline Levine, MD;  Location: [place];  Service: Neurosurgery;  Laterality: Right;  right side anterior approach  . CHOLECYSTECTOMY  1999  . COLONOSCOPY    . LUMBAR FUSION  2005  . TONSILLECTOMY AND ADENOIDECTOMY    . UPPER GASTROINTESTINAL ENDOSCOPY      Allergies  Allergen Reactions  . Lyrica [Pregabalin] Other (See Comments)    Blurred vision  . Gabapentin Rash and Other (See Comments)    Diplopia (double vision)    Family History  Problem Relation Age of Onset  . Stroke Mother   . Diabetes Maternal Grandmother   . Cancer Neg Hx   . Colon cancer Neg Hx   . Esophageal cancer Neg Hx   . Rectal cancer Neg Hx   . Stomach cancer Neg Hx      Social  History   Socioeconomic History  . Marital status: Single    Spouse name: Not on file  . Number of children: 7  . Years of education: Not on file  . Highest education level: Not on file  Occupational History  . 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 Financial resource strain: Not on file  . Food insecurity    Worry: Not on file    Inability: Not on file  . Transportation needs    Medical: Not on file    Non-medical: Not on file  Tobacco Use  . Smoking status: Never Smoker  . Smokeless tobacco: Never Used  Substance and Sexual Activity  . Alcohol use: Yes    Comment: 3-4 beers per week; no alcohol per pt as of 02-09-17 visit  . Drug use: No  . Sexual activity: Not on file  Lifestyle  . Physical activity    Days per week: Not on file    Minutes per session: Not on file  . Stress: Not on file  Relationships  . Social Herbalist on phone: Not on file    Gets together: Not on file    Attends religious service: Not on file    Active member of club or organization: Not on file    Attends meetings of clubs or  organizations: Not on file    Relationship status: Not on file  . Intimate partner violence    Fear of current or ex partner: Not on file    Emotionally abused: Not on file    Physically abused: Not on file    Forced sexual activity: Not on file  Other Topics Concern  . Not on file  Social History Narrative   Divorced     PHYSICAL EXAM:  VS: There were no vitals taken for this visit. Physical Exam Gen: NAD, alert, cooperative with exam, well-appearing   Aspiration/Injection Procedure Note YAHSHUA HASSANI 1941/04/29  Procedure: Injection Indications: right knee pain   Procedure Details Consent: Risks of procedure as well as the alternatives and risks of each were explained to the (patient/caregiver).  Consent for procedure obtained. Time Out: Verified patient identification, verified procedure, site/side was  marked, verified correct patient position, special equipment/implants available, medications/allergies/relevent history reviewed, required imaging and test results available.  Performed.  The area was cleaned with iodine and alcohol swabs.    The right knee superior lateral suprapatellar pouch was injected using 4 cc's of 1% lidocaine with a 22 1 1/2" needle.  The syringe was switched and a 16.8 mg/2 mL of Gelsyn 3 was injected. Ultrasound was used. Images were obtained in  Long views showing the injection.    A sterile dressing was applied.  Patient did tolerate procedure well.   Aspiration/Injection Procedure Note AUNER DETTLING 1941/04/23  Procedure: Injection Indications: left knee pain   Procedure Details Consent: Risks of procedure as well as the alternatives and risks of each were explained to the (patient/caregiver).  Consent for procedure obtained. Time Out: Verified patient identification, verified procedure, site/side was marked, verified correct patient position, special equipment/implants available, medications/allergies/relevent history reviewed, required imaging and test results available.  Performed.  The area was cleaned with iodine and alcohol swabs.    The left knee superior lateral suprapatellar pouch was injected using 4 cc's of 1% lidocaine with a 22 1 1/2" needle.  The syringe was switched and a 16.8 mg/2 mL of Gelsyn 3 was injected. Ultrasound was used. Images were obtained in  Long views showing the injection.    A sterile dressing was applied.  Patient did tolerate procedure well.      ASSESSMENT & PLAN:   Primary osteoarthritis of both knees Completed the series of the Gelsyn3 today.  - f/u in 4 weeks if needed

## 2019-08-12 NOTE — Assessment & Plan Note (Signed)
Completed the series of the [place] today.  - f/u in 4 weeks if needed

## 2019-08-25 ENCOUNTER — Other Ambulatory Visit: Payer: Self-pay | Admitting: Endocrinology

## 2019-09-02 ENCOUNTER — Encounter: Payer: Self-pay | Admitting: Registered Nurse

## 2019-09-02 ENCOUNTER — Encounter: Payer: Medicare Other | Attending: Physical Medicine & Rehabilitation | Admitting: Registered Nurse

## 2019-09-02 ENCOUNTER — Other Ambulatory Visit: Payer: Self-pay

## 2019-09-02 VITALS — BP 151/83 | HR 71 | Temp 97.9°F | Resp 14 | Ht 73.0 in | Wt 219.0 lb

## 2019-09-02 DIAGNOSIS — M542 Cervicalgia: Secondary | ICD-10-CM

## 2019-09-02 DIAGNOSIS — M25519 Pain in unspecified shoulder: Secondary | ICD-10-CM | POA: Diagnosis not present

## 2019-09-02 DIAGNOSIS — Z79891 Long term (current) use of opiate analgesic: Secondary | ICD-10-CM

## 2019-09-02 DIAGNOSIS — M25561 Pain in right knee: Secondary | ICD-10-CM

## 2019-09-02 DIAGNOSIS — G8929 Other chronic pain: Secondary | ICD-10-CM | POA: Insufficient documentation

## 2019-09-02 DIAGNOSIS — M961 Postlaminectomy syndrome, not elsewhere classified: Secondary | ICD-10-CM | POA: Diagnosis not present

## 2019-09-02 DIAGNOSIS — Z9889 Other specified postprocedural states: Secondary | ICD-10-CM | POA: Insufficient documentation

## 2019-09-02 DIAGNOSIS — G894 Chronic pain syndrome: Secondary | ICD-10-CM

## 2019-09-02 DIAGNOSIS — E119 Type 2 diabetes mellitus without complications: Secondary | ICD-10-CM | POA: Diagnosis not present

## 2019-09-02 DIAGNOSIS — M25562 Pain in left knee: Secondary | ICD-10-CM

## 2019-09-02 DIAGNOSIS — Z5181 Encounter for therapeutic drug level monitoring: Secondary | ICD-10-CM

## 2019-09-02 DIAGNOSIS — M24512 Contracture, left shoulder: Secondary | ICD-10-CM

## 2019-09-02 DIAGNOSIS — M5416 Radiculopathy, lumbar region: Secondary | ICD-10-CM

## 2019-09-02 DIAGNOSIS — I10 Essential (primary) hypertension: Secondary | ICD-10-CM | POA: Insufficient documentation

## 2019-09-02 MED ORDER — OXYCODONE-ACETAMINOPHEN 10-325 MG PO TABS: 1.0000 | ORAL_TABLET | Freq: Three times a day (TID) | ORAL | 0 refills | Status: DC | PRN

## 2019-09-02 NOTE — Progress Notes (Signed)
Subjective:    Patient ID: Larry Robertson, male    DOB: 1941-04-28, 78 y.o.   MRN: [id number]  HPI: Larry Robertson is a 78 y.o. male who returns for follow up appointment for chronic pain and medication refill. He states his pain is located in his neck, left shoulder pain, lower back pain ( describes his back pain as deep pain)  radiating into his bilateral lower extremities and bilateral knee pain. He rates his pain 7. His current exercise regime is walking, using the elliptical three days a week for 5 minutes and home gym three days a week 15- 20 minutes.  [name] is 45.00 MME.  Last UDS was Performed on 05/09/2019, it was consistent   Pain Inventory Average Pain 7 Pain Right Now 7 My pain is aching  In the last 24 hours, has pain interfered with the following? General activity 6 Relation with others 2 Enjoyment of life 8 What TIME of day is your pain at its worst? all Sleep (in general) Fair  Pain is worse with: walking, bending, sitting, inactivity, standing and some activites Pain improves with: medication Relief from Meds: 7  Mobility walk without assistance how many minutes can you walk? 20 ability to climb steps?  yes do you drive?  yes  Function retired  Neuro/Psych No problems in this area  Prior Studies Any changes since last visit?  no  Physicians involved in your care Any changes since last visit?  no   Family History  Problem Relation Age of Onset  . Stroke Mother   . Diabetes Maternal Grandmother   . Cancer Neg Hx   . Colon cancer Neg Hx   . Esophageal cancer Neg Hx   . Rectal cancer Neg Hx   . Stomach cancer Neg Hx    Social History   Socioeconomic History  . Marital status: Single    Spouse name: Not on file  . Number of children: 7  . Years of education: Not on file  . Highest education level: Not on file  Occupational History  . 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  Financial resource strain: Not on file  . Food insecurity    Worry: Not on file    Inability: Not on file  . Transportation needs    Medical: Not on file    Non-medical: Not on file  Tobacco Use  . Smoking status: Never Smoker  . Smokeless tobacco: Never Used  Substance and Sexual Activity  . Alcohol use: Yes    Comment: 3-4 beers per week; no alcohol per pt as of 02-09-17 visit  . Drug use: No  . Sexual activity: Not on file  Lifestyle  . Physical activity    Days per week: Not on file    Minutes per session: Not on file  . Stress: Not on file  Relationships  . Social Herbalist on phone: Not on file    Gets together: Not on file    Attends religious service: Not on file    Active member of club or organization: Not on file    Attends meetings of clubs or organizations: Not on file    Relationship status: Not on file  Other Topics Concern  . Not on file  Social History Narrative   Divorced   Past Surgical History:  Procedure Laterality Date  . ABDOMINAL HERNIA REPAIR  2000  . ANTERIOR CERVICAL  DECOMP/DISCECTOMY FUSION Right 03/04/2018   Procedure: Cervical Three-Four, Cervical Six-Seven Right Side approach, Anterior cervical decompression/discectomy/fusion;  Surgeon: Erline Levine, MD;  Location: [place];  Service: Neurosurgery;  Laterality: Right;  right side anterior approach  . CHOLECYSTECTOMY  1999  . COLONOSCOPY    . LUMBAR FUSION  2005  . TONSILLECTOMY AND ADENOIDECTOMY    . UPPER GASTROINTESTINAL ENDOSCOPY     Past Medical History:  Diagnosis Date  . Arthritis   . Blood transfusion without reported diagnosis   . Colon polyps 05/19/2012  . 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 DIABETES MELLITUS, TYPE II 08/25/2007  . Diastolic dysfunction    on echo  . Diverticulosis   . ERECTILE DYSFUNCTION 08/25/2007  . 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 Heart murmur    but was not stated by DR Hilty  . Hepatitis    took treatment  . HEPATITIS C  08/25/2007  . HYPERTENSION 08/25/2007  . 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 HYPOGONADISM 09/27/2009   BP (!) 151/83   Pulse 71   Temp 97.9 F (36.6 C) (Skin)   Resp 14   Ht 6\' 1"  (1.854 m)   Wt 219 lb (99.3 kg)   SpO2 95%   BMI 28.89 kg/m   Opioid Risk Score:   Fall Risk Score:  `1  Depression screen PHQ 2/9  Depression screen [facility] 2/9 03/09/2019 02/09/2019 01/11/2019 11/08/2018 11/05/2018 10/18/2018  Decreased Interest 0 0 0 0 0 0  Down, Depressed, Hopeless - 0 0 0 0 0  PHQ - 2 Score 0 0 0 0 0 0  Altered sleeping - - - - - 0  Tired, decreased energy - - - - - 2  Change in appetite - - - - - 0  Feeling bad or failure about yourself  - - - - - 0  Trouble concentrating - - - - - 0  Moving slowly or fidgety/restless - - - - - 0  Suicidal thoughts - - - - - 0  PHQ-9 Score - - - - - 2    Review of Systems  Constitutional: Negative.   HENT: Negative.   Eyes: Negative.   Respiratory: Negative.   Cardiovascular: Negative.   Gastrointestinal: Negative.   Endocrine: Negative.   Genitourinary: Negative.   Musculoskeletal: Positive for arthralgias, back pain and neck pain.  Skin: Negative.   Allergic/Immunologic: Negative.   Neurological: Negative.   Hematological: Negative.   Psychiatric/Behavioral: Negative.   All other systems reviewed and are negative.      Objective:   Physical Exam Vitals signs and nursing note reviewed.  Constitutional:      Appearance: Normal appearance.  Neck:     Musculoskeletal: Normal range of motion and neck supple.  Cardiovascular:     Rate and Rhythm: Normal rate and regular rhythm.     Pulses: Normal pulses.     Heart sounds: Normal heart sounds.  Pulmonary:     Effort: Pulmonary effort is normal.     Breath sounds: Normal breath sounds.  Musculoskeletal:     Comments: Normal Muscle Bulk and Muscle Testing Reveals:  Upper Extremities: Right: Full ROM and Muscle Strength 5/5  Left: Decreased ROM 30 Degrees and Muscle  Strength Lower Extremities: Full ROM and Muscle Strength 5/5 Arises from Chair slowly Antalgic Gait   Skin:    General: Skin is warm and dry.  Neurological:     Mental Status: He is alert and oriented to person, place,  and time.  Psychiatric:        Mood and Affect: Mood normal.        Behavior: Behavior normal.           Assessment & Plan:  1.Cervical Postlaminectomy Syndrome/ Cervicalgia: Continue HEP as Tolerated. Continue to Monitor.09/02/2019. 2. Contracture of Left Shoulder Joint Region: Continue HEP as Tolerated.09/02/2019. 3. Chronic Low Back Pain/ Lumbar Radiculitis: Continue HEP as Tolerated. Continue to Monitor.09/02/2019 4. Chronic Pain Syndrome:Refilled:Oxycodone 10/325 mg one tablet three times a day as needed for pain #90.09/02/2019 We will continue the opioid monitoring program, this consists of regular clinic visits, examinations, urine drug screen, pill counts as well as use of [place] Controlled Substance Reporting system.  91minutes of face to face patient care time was spent during this visit. All questions were encouraged and answered.  F/U in 1 month

## 2019-09-05 ENCOUNTER — Other Ambulatory Visit: Payer: Self-pay | Admitting: Family Medicine

## 2019-09-05 DIAGNOSIS — I10 Essential (primary) hypertension: Secondary | ICD-10-CM

## 2019-09-09 ENCOUNTER — Encounter: Payer: Self-pay | Admitting: Family Medicine

## 2019-09-09 ENCOUNTER — Other Ambulatory Visit: Payer: Self-pay

## 2019-09-09 ENCOUNTER — Ambulatory Visit (INDEPENDENT_AMBULATORY_CARE_PROVIDER_SITE_OTHER): Payer: Medicare Other | Admitting: Family Medicine

## 2019-09-09 DIAGNOSIS — M109 Gout, unspecified: Secondary | ICD-10-CM | POA: Diagnosis not present

## 2019-09-09 MED ORDER — ALLOPURINOL 100 MG PO TABS: 100.0000 mg | ORAL_TABLET | Freq: Every day | ORAL | 1 refills | Status: DC

## 2019-09-09 MED ORDER — PREDNISONE 5 MG PO TABS: ORAL_TABLET | ORAL | 0 refills | Status: DC

## 2019-09-09 NOTE — Progress Notes (Signed)
Larry Robertson - 78 y.o. male MRN [id number]  Date of birth: 31-Jan-1941  SUBJECTIVE:  Including CC & ROS.  Chief Complaint  Patient presents with  . Follow-up    follow up for bilateral knee    Larry Robertson is a 78 y.o. male that is following up for his bilateral knee pain.  We have tried steroid injections as well as gel injections.  He still has ongoing pain.  He feels it in his legs as well as his knee.  He does not have it on a specific spot.  Denies any mechanical symptoms.  The pain is more constant and moderate in severity.   Review of Systems  Constitutional: Negative for fever.  HENT: Negative for congestion.   Respiratory: Negative for cough.   Cardiovascular: Negative for chest pain.  Gastrointestinal: Negative for abdominal pain.  Musculoskeletal: Positive for arthralgias and joint swelling.  Skin: Negative for color change.  Neurological: Negative for weakness.  Hematological: Negative for adenopathy.    HISTORY: Past Medical, Surgical, Social, and Family History Reviewed & Updated per EMR.   Pertinent Historical Findings include:  Past Medical History:  Diagnosis Date  . Arthritis   . Blood transfusion without reported diagnosis   . Colon polyps 05/19/2012  . 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 DIABETES MELLITUS, TYPE II 08/25/2007  . Diastolic dysfunction    on echo  . Diverticulosis   . ERECTILE DYSFUNCTION 08/25/2007  . 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 Heart murmur    but was not stated by DR Hilty  . Hepatitis    took treatment  . HEPATITIS C 08/25/2007  . HYPERTENSION 08/25/2007  . 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 HYPOGONADISM 09/27/2009    Past Surgical History:  Procedure Laterality Date  . ABDOMINAL HERNIA REPAIR  2000  . ANTERIOR CERVICAL DECOMP/DISCECTOMY FUSION Right 03/04/2018   Procedure: Cervical Three-Four, Cervical Six-Seven Right Side approach, Anterior cervical decompression/discectomy/fusion;  Surgeon:  Erline Levine, MD;  Location: [place];  Service: Neurosurgery;  Laterality: Right;  right side anterior approach  . CHOLECYSTECTOMY  1999  . COLONOSCOPY    . LUMBAR FUSION  2005  . TONSILLECTOMY AND ADENOIDECTOMY    . UPPER GASTROINTESTINAL ENDOSCOPY      Allergies  Allergen Reactions  . Lyrica [Pregabalin] Other (See Comments)    Blurred vision  . Gabapentin Rash and Other (See Comments)    Diplopia (double vision)    Family History  Problem Relation Age of Onset  . Stroke Mother   . Diabetes Maternal Grandmother   . Cancer Neg Hx   . Colon cancer Neg Hx   . Esophageal cancer Neg Hx   . Rectal cancer Neg Hx   . Stomach cancer Neg Hx      Social History   Socioeconomic History  . Marital status: Single    Spouse name: Not on file  . Number of children: 7  . Years of education: Not on file  . Highest education level: Not on file  Occupational History  . 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 Financial resource strain: Not on file  . Food insecurity    Worry: Not on file    Inability: Not on file  . Transportation needs    Medical: Not on file    Non-medical: Not on file  Tobacco Use  . Smoking status: Never Smoker  . Smokeless tobacco: Never Used  Substance  and Sexual Activity  . Alcohol use: Yes    Comment: 3-4 beers per week; no alcohol per pt as of 02-09-17 visit  . Drug use: No  . Sexual activity: Not on file  Lifestyle  . Physical activity    Days per week: Not on file    Minutes per session: Not on file  . Stress: Not on file  Relationships  . Social Herbalist on phone: Not on file    Gets together: Not on file    Attends religious service: Not on file    Active member of club or organization: Not on file    Attends meetings of clubs or organizations: Not on file    Relationship status: Not on file  . Intimate partner violence    Fear of current or ex partner: Not on file    Emotionally abused: Not  on file    Physically abused: Not on file    Forced sexual activity: Not on file  Other Topics Concern  . Not on file  Social History Narrative   Divorced     PHYSICAL EXAM:  VS: BP (!) 149/81   Pulse 72   Ht 6\' 1"  (1.854 m)   Wt 217 lb (98.4 kg)   BMI 28.63 kg/m  Physical Exam Gen: NAD, alert, cooperative with exam, well-appearing ENT: normal lips, normal nasal mucosa,  Eye: normal EOM, normal conjunctiva and lids CV:  no edema, +2 pedal pulses   Resp: no accessory muscle use, non-labored,  Skin: no rashes, no areas of induration  Neuro: normal tone, normal sensation to touch Psych:  normal insight, alert and oriented MSK:  Left and right knee: Mild effusion. No tenderness to palpation of the medial joint line. Mild tenderness to palpation of the lateral joint line. Normal range of motion. Normal strength resistance. Instability with valgus and varus stress testing. Negative McMurray's test. Neurovascular intact     ASSESSMENT & PLAN:   Gouty arthropathy Has not had improvement thus far with steroid injections or gel injections.  We will try medications to see if that improves his joint pain. -Initiate allopurinol. -Provided prednisone to avoid a gout flare. -Have him follow-up in 6 weeks.  If no improvement with titration of the allopurinol then would consider another round of steroid injections.  May need to consider surgery if those fail to improve as well.

## 2019-09-09 NOTE — Patient Instructions (Signed)
Good to see you Please start the prednisone and allopurinol at the same time  Please try tylenol and ice  Please send me a message in MyChart with any questions or updates.  Please see me back in 6 weeks.   --Dr. Raeford Razor

## 2019-09-09 NOTE — Assessment & Plan Note (Signed)
Has not had improvement thus far with steroid injections or gel injections.  We will try medications to see if that improves his joint pain. -Initiate allopurinol. -Provided prednisone to avoid a gout flare. -Have him follow-up in 6 weeks.  If no improvement with titration of the allopurinol then would consider another round of steroid injections.  May need to consider surgery if those fail to improve as well.

## 2019-09-14 ENCOUNTER — Telehealth: Payer: Self-pay

## 2019-09-14 ENCOUNTER — Other Ambulatory Visit: Payer: Self-pay | Admitting: Family Medicine

## 2019-09-14 NOTE — Telephone Encounter (Signed)

## 2019-09-15 ENCOUNTER — Other Ambulatory Visit: Payer: Self-pay | Admitting: Endocrinology

## 2019-09-15 ENCOUNTER — Other Ambulatory Visit: Payer: Self-pay

## 2019-09-15 ENCOUNTER — Ambulatory Visit (INDEPENDENT_AMBULATORY_CARE_PROVIDER_SITE_OTHER): Payer: Medicare Other | Admitting: Family Medicine

## 2019-09-15 ENCOUNTER — Encounter: Payer: Self-pay | Admitting: Family Medicine

## 2019-09-15 VITALS — BP 120/76 | HR 82 | Temp 97.7°F | Ht 73.0 in | Wt 215.0 lb

## 2019-09-15 DIAGNOSIS — Z23 Encounter for immunization: Secondary | ICD-10-CM

## 2019-09-15 DIAGNOSIS — M109 Gout, unspecified: Secondary | ICD-10-CM | POA: Diagnosis not present

## 2019-09-15 DIAGNOSIS — I10 Essential (primary) hypertension: Secondary | ICD-10-CM

## 2019-09-15 DIAGNOSIS — N644 Mastodynia: Secondary | ICD-10-CM

## 2019-09-15 DIAGNOSIS — E7800 Pure hypercholesterolemia, unspecified: Secondary | ICD-10-CM | POA: Diagnosis not present

## 2019-09-15 DIAGNOSIS — N63 Unspecified lump in unspecified breast: Secondary | ICD-10-CM | POA: Diagnosis not present

## 2019-09-15 DIAGNOSIS — D649 Anemia, unspecified: Secondary | ICD-10-CM

## 2019-09-15 LAB — CBC
HCT: 38.5 % — ABNORMAL LOW (ref 39.0–52.0)
Hemoglobin: 12.9 g/dL — ABNORMAL LOW (ref 13.0–17.0)
MCHC: 33.6 g/dL (ref 30.0–36.0)
MCV: 92.5 fl (ref 78.0–100.0)
Platelets: 211 10*3/uL (ref 150.0–400.0)
RBC: 4.16 Mil/uL — ABNORMAL LOW (ref 4.22–5.81)
RDW: 12.8 % (ref 11.5–15.5)
WBC: 9.7 10*3/uL (ref 4.0–10.5)

## 2019-09-15 LAB — URINALYSIS, ROUTINE W REFLEX MICROSCOPIC
Bilirubin Urine: NEGATIVE
Hgb urine dipstick: NEGATIVE
Ketones, ur: NEGATIVE
Leukocytes,Ua: NEGATIVE
Nitrite: NEGATIVE
RBC / HPF: NONE SEEN (ref 0–?)
Specific Gravity, Urine: 1.01 (ref 1.000–1.030)
Total Protein, Urine: NEGATIVE
Urine Glucose: NEGATIVE
Urobilinogen, UA: 0.2 (ref 0.0–1.0)
pH: 5.5 (ref 5.0–8.0)

## 2019-09-15 LAB — COMPREHENSIVE METABOLIC PANEL
ALT: 20 U/L (ref 0–53)
AST: 14 U/L (ref 0–37)
Albumin: 4.8 g/dL (ref 3.5–5.2)
Alkaline Phosphatase: 33 U/L — ABNORMAL LOW (ref 39–117)
BUN: 36 mg/dL — ABNORMAL HIGH (ref 6–23)
CO2: 19 mEq/L (ref 19–32)
Calcium: 9.7 mg/dL (ref 8.4–10.5)
Chloride: 103 mEq/L (ref 96–112)
Creatinine, Ser: 1.44 mg/dL (ref 0.40–1.50)
GFR: 47.27 mL/min — ABNORMAL LOW (ref >60.00)
Glucose, Bld: 114 mg/dL — ABNORMAL HIGH (ref 70–99)
Potassium: 3.9 mEq/L (ref 3.5–5.1)
Sodium: 134 mEq/L — ABNORMAL LOW (ref 135–145)
Total Bilirubin: 0.5 mg/dL (ref 0.2–1.2)
Total Protein: 7.2 g/dL (ref 6.0–8.3)

## 2019-09-15 LAB — MICROALBUMIN / CREATININE URINE RATIO
Creatinine,U: 33.5 mg/dL
Microalb Creat Ratio: 2.1 mg/g (ref 0.0–30.0)
Microalb, Ur: <0.7 mg/dL (ref 0.0–1.9)

## 2019-09-15 LAB — LIPID PANEL
Cholesterol: 180 mg/dL (ref 0–200)
HDL: 47.2 mg/dL (ref >39.00)
Total CHOL/HDL Ratio: 4
Triglycerides: 425 mg/dL — ABNORMAL HIGH (ref 0.0–149.0)

## 2019-09-15 LAB — TSH: TSH: 3.38 u[IU]/mL (ref 0.35–4.50)

## 2019-09-15 LAB — URIC ACID: Uric Acid, Serum: 11.8 mg/dL — ABNORMAL HIGH (ref 4.0–7.8)

## 2019-09-15 LAB — LDL CHOLESTEROL, DIRECT: Direct LDL: 75 mg/dL

## 2019-09-15 NOTE — Progress Notes (Addendum)
Established Patient Office Visit  Subjective:  Patient ID: Larry Robertson, male    DOB: 01/20/1941  Age: 78 y.o. MRN: [id number]  CC:  Chief Complaint  Patient presents with  . Follow-up    HPI Larry Robertson presents for follow-up of his hypertension, elevated cholesterol gouty arthritis.  Continues to take the Azor for his blood pressure without issue EPS been well controlled.  Continues spironolactone and Lasix for his ascites.  Tells me that he quit drinking altogether 3 months ago.  Recently just started allopurinol in the past week.  He has had some left breast tenderness.  Scheduled to see the physician tomorrow.  He does not see the dentist because he has upper and lower plates.  Past Medical History:  Diagnosis Date  . Arthritis   . Blood transfusion without reported diagnosis   . Colon polyps 05/19/2012  . 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 DIABETES MELLITUS, TYPE II 08/25/2007  . Diastolic dysfunction    on echo  . Diverticulosis   . ERECTILE DYSFUNCTION 08/25/2007  . 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 Heart murmur    but was not stated by DR Hilty  . Hepatitis    took treatment  . HEPATITIS C 08/25/2007  . HYPERTENSION 08/25/2007  . 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 HYPOGONADISM 09/27/2009    Past Surgical History:  Procedure Laterality Date  . ABDOMINAL HERNIA REPAIR  2000  . ANTERIOR CERVICAL DECOMP/DISCECTOMY FUSION Right 03/04/2018   Procedure: Cervical Three-Four, Cervical Six-Seven Right Side approach, Anterior cervical decompression/discectomy/fusion;  Surgeon: Erline Levine, MD;  Location: [place];  Service: Neurosurgery;  Laterality: Right;  right side anterior approach  . CHOLECYSTECTOMY  1999  . COLONOSCOPY    . LUMBAR FUSION  2005  . TONSILLECTOMY AND ADENOIDECTOMY    . UPPER GASTROINTESTINAL ENDOSCOPY      Family History  Problem Relation Age of Onset  . Stroke Mother   . Diabetes Maternal Grandmother   . Cancer  Neg Hx   . Colon cancer Neg Hx   . Esophageal cancer Neg Hx   . Rectal cancer Neg Hx   . Stomach cancer Neg Hx     Social History   Socioeconomic History  . Marital status: Single    Spouse name: Not on file  . Number of children: 7  . Years of education: Not on file  . Highest education level: Not on file  Occupational History  . 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 Financial resource strain: Not on file  . Food insecurity    Worry: Not on file    Inability: Not on file  . Transportation needs    Medical: Not on file    Non-medical: Not on file  Tobacco Use  . Smoking status: Never Smoker  . Smokeless tobacco: Never Used  Substance and Sexual Activity  . Alcohol use: Not Currently    Comment: Quit 3 months ago in July 2020.  Marland Kitchen Drug use: No  . Sexual activity: Not on file  Lifestyle  . Physical activity    Days per week: Not on file    Minutes per session: Not on file  . Stress: Not on file  Relationships  . Social Herbalist on phone: Not on file    Gets together: Not on file    Attends religious service: Not on file    Active member of  club or organization: Not on file    Attends meetings of clubs or organizations: Not on file    Relationship status: Not on file  . Intimate partner violence    Fear of current or ex partner: Not on file    Emotionally abused: Not on file    Physically abused: Not on file    Forced sexual activity: Not on file  Other Topics Concern  . Not on file  Social History Narrative   Divorced    Outpatient Medications Prior to Visit  Medication Sig Dispense Refill  . ACCU-CHEK AVIVA PLUS test strip USE AS INSTRUCTED ONCE DAILY 50 strip 7  . allopurinol (ZYLOPRIM) 100 MG tablet Take 1 tablet (100 mg total) by mouth daily. 30 tablet 1  . amLODipine-olmesartan (AZOR) 10-40 MG tablet TAKE 1 TABLET BY MOUTH EVERY DAY 90 tablet 0  . atorvastatin (LIPITOR) 20 MG tablet Take 1 tablet (20 mg  total) by mouth at bedtime. 90 tablet 1  . colchicine 0.6 MG tablet 1 tablet every hour as needed for gout, not to exceed 6/day 6 tablet 4  . DULoxetine (CYMBALTA) 60 MG capsule TAKE 1 CAPSULE BY MOUTH EVERY DAY 90 capsule 2  . furosemide (LASIX) 40 MG tablet TAKE 1 TABLET BY MOUTH EVERY DAY 90 tablet 0  . JANUVIA 100 MG tablet TAKE 1 TABLET BY MOUTH EVERY DAY 90 tablet 3  . metFORMIN (GLUCOPHAGE) 1000 MG tablet TAKE 1 TABLET BY MOUTH 2 TIMES DAILY WITH A MEAL. 180 tablet 1  . oxyCODONE-acetaminophen (PERCOCET) 10-325 MG tablet Take 1 tablet by mouth 3 (three) times daily as needed for pain. 90 tablet 0  . predniSONE (DELTASONE) 5 MG tablet Take 6 pills for first day, 5 pills second day, 4 pills third day, 3 pills fourth day, 2 pills the fifth day, and 1 pill sixth day. 21 tablet 0  . repaglinide (PRANDIN) 1 MG tablet Take 1 tablet (1 mg total) by mouth 3 (three) times daily before meals. 270 tablet 3  . spironolactone (ALDACTONE) 25 MG tablet TAKE 1 TABLET BY MOUTH EVERY DAY 90 tablet 0  . traZODone (DESYREL) 100 MG tablet Take 1 tablet by mouth at bedtime.     No facility-administered medications prior to visit.     Allergies  Allergen Reactions  . Lyrica [Pregabalin] Other (See Comments)    Blurred vision  . Gabapentin Rash and Other (See Comments)    Diplopia (double vision)    ROS Review of Systems  Constitutional: Negative.   HENT: Negative.   Eyes: Negative for photophobia and visual disturbance.  Respiratory: Negative.   Cardiovascular: Negative.   Gastrointestinal: Negative.   Endocrine: Negative for polyphagia and polyuria.  Genitourinary: Negative for difficulty urinating, frequency and urgency.  Musculoskeletal: Negative for gait problem and joint swelling.  Skin: Negative for pallor and rash.  Allergic/Immunologic: Negative for immunocompromised state.  Neurological: Negative for light-headedness and numbness.  Hematological: Does not bruise/bleed easily.   Psychiatric/Behavioral: Negative.       Objective:    Physical Exam  Constitutional: He is oriented to person, place, and time. He appears well-developed and well-nourished. No distress.  HENT:  Head: Normocephalic and atraumatic.  Right Ear: External ear normal.  Left Ear: External ear normal.  Mouth/Throat: Oropharynx is clear and moist. No oropharyngeal exudate.  Eyes: Pupils are equal, round, and reactive to light. Conjunctivae are normal. Right eye exhibits no discharge. Left eye exhibits no discharge. No scleral icterus.  Neck: Neck supple. No  JVD present. No tracheal deviation present. No thyromegaly present.  Cardiovascular: Normal rate, regular rhythm and normal heart sounds.  Pulmonary/Chest: Effort normal and breath sounds normal. No stridor. Left breast exhibits mass and tenderness. Left breast exhibits no inverted nipple, no nipple discharge and no skin change.  Abdominal: Bowel sounds are normal. He exhibits distension. There is no abdominal tenderness. There is no rebound and no guarding. A hernia is present. Hernia confirmed positive in the ventral area.  Musculoskeletal:        General: No edema.  Lymphadenopathy:    He has no cervical adenopathy.  Neurological: He is alert and oriented to person, place, and time.  Skin: Skin is warm and dry. He is not diaphoretic.  Psychiatric: He has a normal mood and affect. His behavior is normal.    BP 120/76   Pulse 82   Temp 97.7 F (36.5 C) (Temporal)   Ht 6\' 1"  (1.854 m)   Wt 215 lb (97.5 kg)   SpO2 97%   BMI 28.37 kg/m  Wt Readings from Last 3 Encounters:  09/15/19 215 lb (97.5 kg)  09/09/19 217 lb (98.4 kg)  09/02/19 219 lb (99.3 kg)   BP Readings from Last 3 Encounters:  09/15/19 120/76  09/09/19 (!) 149/81  09/02/19 (!) 151/83   Guideline developer:  UpToDate (see UpToDate for funding source) Date Released: June 2014  Health Maintenance Due  Topic Date Due  . OPHTHALMOLOGY EXAM  09/01/2019    There  are no preventive care reminders to display for this patient.  Lab Results  Component Value Date   TSH 3.38 09/15/2019   Lab Results  Component Value Date   WBC 9.7 09/15/2019   HGB 12.9 (L) 09/15/2019   HCT 38.5 (L) 09/15/2019   MCV 92.5 09/15/2019   PLT 211.0 09/15/2019   Lab Results  Component Value Date   NA 134 (L) 09/15/2019   K 3.9 09/15/2019   CO2 19 09/15/2019   GLUCOSE 114 (H) 09/15/2019   BUN 36 (H) 09/15/2019   CREATININE 1.44 09/15/2019   BILITOT 0.5 09/15/2019   ALKPHOS 33 (L) 09/15/2019   AST 14 09/15/2019   ALT 20 09/15/2019   PROT 7.2 09/15/2019   ALBUMIN 4.8 09/15/2019   CALCIUM 9.7 09/15/2019   ANIONGAP 14 02/24/2018   GFR 47.27 (L) 09/15/2019   Lab Results  Component Value Date   CHOL 180 09/15/2019   Lab Results  Component Value Date   HDL 47.20 09/15/2019   Lab Results  Component Value Date   LDLCALC 39 01/21/2018   Lab Results  Component Value Date   TRIG (H) 09/15/2019    425.0 Triglyceride is over 400; calculations on Lipids are invalid.   Lab Results  Component Value Date   CHOLHDL 4 09/15/2019   Lab Results  Component Value Date   HGBA1C 6.9 (A) 06/02/2019      Assessment & Plan:   Problem List Items Addressed This Visit      Cardiovascular and Mediastinum   Essential hypertension - Primary   Relevant Orders   Comprehensive metabolic panel (Completed)   CBC (Completed)   Urinalysis, Routine w reflex microscopic (Completed)   Microalbumin / creatinine urine ratio (Completed)     Musculoskeletal and Integument   Gouty arthropathy   Relevant Orders   Comprehensive metabolic panel (Completed)   Uric acid (Completed)     Other   Elevated cholesterol   Relevant Orders   Comprehensive metabolic panel (Completed)  Lipid panel (Completed)   Breast mass   Relevant Orders   US BREAST COMPLETE UNI LEFT INC AXILLA   TSH (Completed)   Prolactin (Completed)   Breast tenderness in male   Relevant Orders   US BREAST  COMPLETE UNI LEFT INC AXILLA   TSH (Completed)   Prolactin (Completed)   Anemia   Relevant Orders   Iron, TIBC and Ferritin Panel   Vitamin B12   Need for influenza vaccination   Relevant Orders   Flu Vaccine QUAD High Dose(Fluad) (Completed)      No orders of the defined types were placed in this encounter.   Follow-up: Return in about 3 months (around 12/16/2019).   Congratulated patient on his decision to stop drinking.

## 2019-09-16 LAB — PROLACTIN: Prolactin: 7 ng/mL (ref 2.0–18.0)

## 2019-09-19 DIAGNOSIS — Z23 Encounter for immunization: Secondary | ICD-10-CM | POA: Insufficient documentation

## 2019-09-19 DIAGNOSIS — D649 Anemia, unspecified: Secondary | ICD-10-CM | POA: Insufficient documentation

## 2019-09-19 NOTE — Addendum Note (Signed)
Addended by: Jon Billings on: 09/19/2019 02:09 PM   Modules accepted: Orders

## 2019-09-20 ENCOUNTER — Other Ambulatory Visit: Payer: Self-pay

## 2019-09-20 ENCOUNTER — Other Ambulatory Visit (INDEPENDENT_AMBULATORY_CARE_PROVIDER_SITE_OTHER): Payer: Medicare Other

## 2019-09-20 DIAGNOSIS — D649 Anemia, unspecified: Secondary | ICD-10-CM

## 2019-09-20 LAB — VITAMIN B12: Vitamin B-12: 303 pg/mL (ref 211–911)

## 2019-09-21 ENCOUNTER — Other Ambulatory Visit: Payer: Self-pay | Admitting: Family Medicine

## 2019-09-21 DIAGNOSIS — N644 Mastodynia: Secondary | ICD-10-CM

## 2019-09-21 DIAGNOSIS — N63 Unspecified lump in unspecified breast: Secondary | ICD-10-CM

## 2019-09-21 LAB — IRON,TIBC AND FERRITIN PANEL
%SAT: 62 % (calc) — ABNORMAL HIGH (ref 20–48)
Ferritin: 208 ng/mL (ref 24–380)
Iron: 211 ug/dL — ABNORMAL HIGH (ref 50–180)
TIBC: 341 mcg/dL (calc) (ref 250–425)

## 2019-09-27 ENCOUNTER — Other Ambulatory Visit: Payer: Self-pay

## 2019-09-27 ENCOUNTER — Encounter (HOSPITAL_BASED_OUTPATIENT_CLINIC_OR_DEPARTMENT_OTHER): Payer: Medicare Other | Admitting: Registered Nurse

## 2019-09-27 ENCOUNTER — Ambulatory Visit: Payer: Medicare Other

## 2019-09-27 ENCOUNTER — Ambulatory Visit
Admission: RE | Admit: 2019-09-27 | Discharge: 2019-09-27 | Disposition: A | Discharge status: Home / Self Care | Payer: Medicare Other | Source: Ambulatory Visit | Attending: Family Medicine | Admitting: Family Medicine

## 2019-09-27 ENCOUNTER — Encounter: Payer: Self-pay | Admitting: Registered Nurse

## 2019-09-27 VITALS — BP 112/72 | HR 75 | Temp 97.7°F | Ht 73.0 in | Wt 216.0 lb

## 2019-09-27 DIAGNOSIS — M961 Postlaminectomy syndrome, not elsewhere classified: Secondary | ICD-10-CM | POA: Diagnosis not present

## 2019-09-27 DIAGNOSIS — Z79891 Long term (current) use of opiate analgesic: Secondary | ICD-10-CM

## 2019-09-27 DIAGNOSIS — N644 Mastodynia: Secondary | ICD-10-CM

## 2019-09-27 DIAGNOSIS — G894 Chronic pain syndrome: Secondary | ICD-10-CM

## 2019-09-27 DIAGNOSIS — M25561 Pain in right knee: Secondary | ICD-10-CM

## 2019-09-27 DIAGNOSIS — Z5181 Encounter for therapeutic drug level monitoring: Secondary | ICD-10-CM

## 2019-09-27 DIAGNOSIS — M24512 Contracture, left shoulder: Secondary | ICD-10-CM

## 2019-09-27 DIAGNOSIS — M25562 Pain in left knee: Secondary | ICD-10-CM

## 2019-09-27 DIAGNOSIS — M545 Low back pain, unspecified: Secondary | ICD-10-CM

## 2019-09-27 DIAGNOSIS — G8929 Other chronic pain: Secondary | ICD-10-CM

## 2019-09-27 DIAGNOSIS — N63 Unspecified lump in unspecified breast: Secondary | ICD-10-CM

## 2019-09-27 MED ORDER — OXYCODONE-ACETAMINOPHEN 10-325 MG PO TABS: 1.0000 | ORAL_TABLET | Freq: Three times a day (TID) | ORAL | 0 refills | Status: DC | PRN

## 2019-09-27 NOTE — Progress Notes (Signed)
Subjective:    Patient ID: Larry Robertson, male    DOB: [birth year], 78 y.o.   MRN: [id number]  HPI: Larry Robertson is a 78 y.o. male who returns for follow up appointment for chronic pain and medication refill. He states his pain is located in his left breast ( left nipple with palpation), left shoulder, lower back pain, bilateral lower extremities and bilateral knee pain. He rates his pain 7. His current exercise regime is walking and performing stretching exercises.  Larry Robertson Morphine equivalent is 45.00 MME.  UDS was Performed today.    Pain Inventory Average Pain 7 Pain Right Now 7 My pain is aching  In the last 24 hours, has pain interfered with the following? General activity 6 Relation with others 3 Enjoyment of life 6 What TIME of day is your pain at its worst? all Sleep (in general) Fair  Pain is worse with: walking, bending, sitting, inactivity, standing, unsure and some activites Pain improves with: medication Relief from Meds: 7  Mobility walk without assistance how many minutes can you walk? 10 ability to climb steps?  yes do you drive?  yes  Function retired  Neuro/Psych No problems in this area  Prior Studies Any changes since last visit?  no  Physicians involved in your care Any changes since last visit?  no   Family History  Problem Relation Age of Onset  . Stroke Mother   . Diabetes Maternal Grandmother   . Cancer Neg Hx   . Colon cancer Neg Hx   . Esophageal cancer Neg Hx   . Rectal cancer Neg Hx   . Stomach cancer Neg Hx    Social History   Socioeconomic History  . Marital status: Single    Spouse name: Not on file  . Number of children: 7  . Years of education: Not on file  . Highest education level: Not on file  Occupational History  . 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 Financial resource strain: Not on file  . Food insecurity    Worry: Not on file    Inability: Not on file  .  Transportation needs    Medical: Not on file    Non-medical: Not on file  Tobacco Use  . Smoking status: Never Smoker  . Smokeless tobacco: Never Used  Substance and Sexual Activity  . Alcohol use: Not Currently    Comment: Quit 3 months ago in July 2020.  Marland Kitchen Drug use: No  . Sexual activity: Not on file  Lifestyle  . Physical activity    Days per week: Not on file    Minutes per session: Not on file  . Stress: Not on file  Relationships  . Social Herbalist on phone: Not on file    Gets together: Not on file    Attends religious service: Not on file    Active member of club or organization: Not on file    Attends meetings of clubs or organizations: Not on file    Relationship status: Not on file  Other Topics Concern  . Not on file  Social History Narrative   Divorced   Past Surgical History:  Procedure Laterality Date  . ABDOMINAL HERNIA REPAIR  2000  . ANTERIOR CERVICAL DECOMP/DISCECTOMY FUSION Right 03/04/2018   Procedure: Cervical Three-Four, Cervical Six-Seven Right Side approach, Anterior cervical decompression/discectomy/fusion;  Surgeon: Erline Levine, MD;  Location: [place];  Service: Neurosurgery;  Laterality: Right;  right side anterior approach  . CHOLECYSTECTOMY  1999  . COLONOSCOPY    . LUMBAR FUSION  2005  . TONSILLECTOMY AND ADENOIDECTOMY    . UPPER GASTROINTESTINAL ENDOSCOPY     Past Medical History:  Diagnosis Date  . Arthritis   . Blood transfusion without reported diagnosis   . Colon polyps 05/19/2012  . 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 DIABETES MELLITUS, TYPE II 08/25/2007  . Diastolic dysfunction    on echo  . Diverticulosis   . ERECTILE DYSFUNCTION 08/25/2007  . 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 Heart murmur    but was not stated by DR Hilty  . Hepatitis    took treatment  . HEPATITIS C 08/25/2007  . HYPERTENSION 08/25/2007  . 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 HYPOGONADISM 09/27/2009   BP 112/72    Pulse 75   Temp 97.7 F (36.5 C)   Ht 6\' 1"  (1.854 m)   Wt 216 lb (98 kg)   SpO2 95%   BMI 28.50 kg/m   Opioid Risk Score:   Fall Risk Score:  `1  Depression screen PHQ 2/9  Depression screen [facility] 2/9 03/09/2019 02/09/2019 01/11/2019 11/08/2018 11/05/2018 10/18/2018  Decreased Interest 0 0 0 0 0 0  Down, Depressed, Hopeless - 0 0 0 0 0  PHQ - 2 Score 0 0 0 0 0 0  Altered sleeping - - - - - 0  Tired, decreased energy - - - - - 2  Change in appetite - - - - - 0  Feeling bad or failure about yourself  - - - - - 0  Trouble concentrating - - - - - 0  Moving slowly or fidgety/restless - - - - - 0  Suicidal thoughts - - - - - 0  PHQ-9 Score - - - - - 2  Some recent data might be hidden     Review of Systems  Musculoskeletal: Positive for gait problem.  All other systems reviewed and are negative.      Objective:   Physical Exam Vitals signs and nursing note reviewed.  Constitutional:      Appearance: Normal appearance.  Neck:     Musculoskeletal: Normal range of motion and neck supple.  Cardiovascular:     Rate and Rhythm: Normal rate and regular rhythm.     Pulses: Normal pulses.     Heart sounds: Normal heart sounds.  Pulmonary:     Effort: Pulmonary effort is normal.     Breath sounds: Normal breath sounds.  Musculoskeletal:     Comments: Normal Muscle Bulk and Muscle Testing Reveals:  Upper Extremities: Right: Full ROM and Muscle Strength 5/5 Left: Decreased ROM 30 Degrees and Muscle Strength 4/5 Left Nipple tenderness with palpation Lumbar Paraspinal Tenderness: L-4-l-5  Lower Extremities: Full ROM and Muscle Strength 5/5 Arises from Chair slowly Antalgic Gait   Skin:    General: Skin is warm and dry.  Neurological:     Mental Status: He is alert and oriented to person, place, and time.  Psychiatric:        Mood and Affect: Mood normal.        Behavior: Behavior normal.           Assessment & Plan:  1.Cervical Postlaminectomy Syndrome/ Cervicalgia:  Continue HEP as Tolerated. Continue to Monitor.09/27/2019. 2. Contracture of Left Shoulder Joint Region: Continue HEP as Tolerated.09/27/2019. 3. Chronic Low Back Pain/ Lumbar Radiculitis: Continue HEP as Tolerated. Continue  to Monitor.09/27/2019 4. Chronic Pain Syndrome:Refilled:Oxycodone 10/325 mg one tablet three times a day as needed for pain #90.09/27/2019 We will continue the opioid monitoring program, this consists of regular clinic visits, examinations, urine drug screen, pill counts as well as use of [place] Controlled Substance Reporting system. 5. Bilateral Chronic Knee Pain: Continue HEP as Tolerated. Continue to Monitor.  6. Left Breast ( Nipple Pain): PCP Following: MRI scheduled for today.   48minutes of face to face patient care time was spent during this visit. All questions were encouraged and answered.  F/U in 1 month

## 2019-09-29 ENCOUNTER — Other Ambulatory Visit: Payer: Self-pay

## 2019-09-30 ENCOUNTER — Other Ambulatory Visit: Payer: Self-pay | Admitting: Family Medicine

## 2019-09-30 ENCOUNTER — Encounter: Payer: Medicare Other | Admitting: Registered Nurse

## 2019-09-30 DIAGNOSIS — I10 Essential (primary) hypertension: Secondary | ICD-10-CM

## 2019-09-30 LAB — TOXASSURE SELECT,+ANTIDEPR,UR

## 2019-10-01 ENCOUNTER — Other Ambulatory Visit: Payer: Self-pay | Admitting: Family Medicine

## 2019-10-01 DIAGNOSIS — I10 Essential (primary) hypertension: Secondary | ICD-10-CM

## 2019-10-03 ENCOUNTER — Other Ambulatory Visit: Payer: Self-pay

## 2019-10-03 ENCOUNTER — Encounter: Payer: Self-pay | Admitting: Endocrinology

## 2019-10-03 ENCOUNTER — Telehealth: Payer: Self-pay | Admitting: *Deleted

## 2019-10-03 ENCOUNTER — Ambulatory Visit (INDEPENDENT_AMBULATORY_CARE_PROVIDER_SITE_OTHER): Payer: Medicare Other | Admitting: Endocrinology

## 2019-10-03 VITALS — BP 142/72 | HR 77 | Ht 73.0 in | Wt 215.8 lb

## 2019-10-03 DIAGNOSIS — I10 Essential (primary) hypertension: Secondary | ICD-10-CM

## 2019-10-03 DIAGNOSIS — E1129 Type 2 diabetes mellitus with other diabetic kidney complication: Secondary | ICD-10-CM | POA: Diagnosis not present

## 2019-10-03 DIAGNOSIS — E119 Type 2 diabetes mellitus without complications: Secondary | ICD-10-CM

## 2019-10-03 LAB — POCT GLYCOSYLATED HEMOGLOBIN (HGB A1C): Hemoglobin A1C: 5.9 % — AB (ref 4.0–5.6)

## 2019-10-03 MED ORDER — REPAGLINIDE 0.5 MG PO TABS: 0.5000 mg | ORAL_TABLET | Freq: Three times a day (TID) | ORAL | 3 refills | Status: DC

## 2019-10-03 NOTE — Progress Notes (Signed)
Subjective:    Patient ID: Larry Robertson, male    DOB: [birth year], 78 y.o.   MRN: [id number]  HPI Pt returns for f/u of diabetes mellitus:  DM type: 2 Dx'ed: 2010.  Complications: renal insuff Therapy: 3 oral meds.  DKA: never.  Severe hypoglycemia: never.  Pancreatitis: never.  Other: edema and nocturia (he no longer takes d-DAVP), limit oral rx options; he has never taken insulin;  Interval history: no cbg record, but states cbg's are in the low-100's.  he takes meds as rx'ed.  pt states he feels well in general.   Past Medical History:  Diagnosis Date  . Arthritis   . Blood transfusion without reported diagnosis   . Colon polyps 05/19/2012  . 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 DIABETES MELLITUS, TYPE II 08/25/2007  . Diastolic dysfunction    on echo  . Diverticulosis   . ERECTILE DYSFUNCTION 08/25/2007  . 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 Heart murmur    but was not stated by DR Hilty  . Hepatitis    took treatment  . HEPATITIS C 08/25/2007  . HYPERTENSION 08/25/2007  . 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 HYPOGONADISM 09/27/2009    Past Surgical History:  Procedure Laterality Date  . ABDOMINAL HERNIA REPAIR  2000  . ANTERIOR CERVICAL DECOMP/DISCECTOMY FUSION Right 03/04/2018   Procedure: Cervical Three-Four, Cervical Six-Seven Right Side approach, Anterior cervical decompression/discectomy/fusion;  Surgeon: Erline Levine, MD;  Location: [place];  Service: Neurosurgery;  Laterality: Right;  right side anterior approach  . CHOLECYSTECTOMY  1999  . COLONOSCOPY    . LUMBAR FUSION  2005  . TONSILLECTOMY AND ADENOIDECTOMY    . UPPER GASTROINTESTINAL ENDOSCOPY      Social History   Socioeconomic History  . Marital status: Single    Spouse name: Not on file  . Number of children: 7  . Years of education: Not on file  . Highest education level: Not on file  Occupational History  . 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 Financial resource strain: Not on file  . Food insecurity    Worry: Not on file    Inability: Not on file  . Transportation needs    Medical: Not on file    Non-medical: Not on file  Tobacco Use  . Smoking status: Never Smoker  . Smokeless tobacco: Never Used  Substance and Sexual Activity  . Alcohol use: Not Currently    Comment: Quit 3 months ago in July 2020.  Marland Kitchen Drug use: No  . Sexual activity: Not on file  Lifestyle  . Physical activity    Days per week: Not on file    Minutes per session: Not on file  . Stress: Not on file  Relationships  . Social Herbalist on phone: Not on file    Gets together: Not on file    Attends religious service: Not on file    Active member of club or organization: Not on file    Attends meetings of clubs or organizations: Not on file    Relationship status: Not on file  . Intimate partner violence    Fear of current or ex partner: Not on file    Emotionally abused: Not on file    Physically abused: Not on file    Forced sexual activity: Not on file  Other Topics Concern  . Not on file  Social History Narrative  Divorced    Current Outpatient Medications on File Prior to Visit  Medication Sig Dispense Refill  . ACCU-CHEK AVIVA PLUS test strip USE AS INSTRUCTED ONCE DAILY 50 strip 7  . allopurinol (ZYLOPRIM) 100 MG tablet TAKE 1 TABLET BY MOUTH EVERY DAY 30 tablet 1  . amLODipine-olmesartan (AZOR) 10-40 MG tablet TAKE 1 TABLET BY MOUTH EVERY DAY 90 tablet 1  . atorvastatin (LIPITOR) 20 MG tablet Take 1 tablet (20 mg total) by mouth at bedtime. 90 tablet 1  . colchicine 0.6 MG tablet 1 tablet every hour as needed for gout, not to exceed 6/day 6 tablet 4  . DULoxetine (CYMBALTA) 60 MG capsule TAKE 1 CAPSULE BY MOUTH EVERY DAY 90 capsule 2  . furosemide (LASIX) 40 MG tablet TAKE 1 TABLET BY MOUTH EVERY DAY 90 tablet 0  . JANUVIA 100 MG tablet TAKE 1 TABLET BY MOUTH EVERY DAY 90 tablet 3  . metFORMIN  (GLUCOPHAGE) 1000 MG tablet TAKE 1 TABLET BY MOUTH 2 TIMES DAILY WITH A MEAL. 180 tablet 1  . oxyCODONE-acetaminophen (PERCOCET) 10-325 MG tablet Take 1 tablet by mouth 3 (three) times daily as needed for pain. 90 tablet 0  . predniSONE (DELTASONE) 5 MG tablet Take 6 pills for first day, 5 pills second day, 4 pills third day, 3 pills fourth day, 2 pills the fifth day, and 1 pill sixth day. 21 tablet 0  . spironolactone (ALDACTONE) 25 MG tablet TAKE 1 TABLET BY MOUTH EVERY DAY 90 tablet 1  . traZODone (DESYREL) 100 MG tablet Take 1 tablet by mouth at bedtime.     No current facility-administered medications on file prior to visit.     Allergies  Allergen Reactions  . Lyrica [Pregabalin] Other (See Comments)    Blurred vision  . Gabapentin Rash and Other (See Comments)    Diplopia (double vision)    Family History  Problem Relation Age of Onset  . Stroke Mother   . Diabetes Maternal Grandmother   . Cancer Neg Hx   . Colon cancer Neg Hx   . Esophageal cancer Neg Hx   . Rectal cancer Neg Hx   . Stomach cancer Neg Hx     BP (!) 142/72 (BP Location: Right Arm, Patient Position: Sitting, Cuff Size: Normal)   Pulse 77   Ht 6\' 1"  (1.854 m)   Wt 215 lb 12.8 oz (97.9 kg)   SpO2 98%   BMI 28.47 kg/m    Review of Systems He denies hypoglycemia.      Objective:   Physical Exam VITAL SIGNS:  See vs page GENERAL: no distress Pulses: dorsalis pedis intact bilat.   MSK: no deformity of the feet CV: no leg edema Skin:  no ulcer on the feet.  normal color and temp on the feet.  Neuro: sensation is intact to touch on the feet.    Lab Results  Component Value Date   HGBA1C 5.9 (A) 10/03/2019   Lab Results  Component Value Date   CREATININE 1.44 09/15/2019   BUN 36 (H) 09/15/2019   NA 134 (L) 09/15/2019   K 3.9 09/15/2019   CL 103 09/15/2019   CO2 19 09/15/2019       Assessment & Plan:  HTN: is noted today Type 2 DM: overcontrolled Renal insuff: This limits rx options and  dosages.  Patient Instructions  Your blood pressure is high today.  Please see your primary care provider soon, to have it rechecked. I have sent a prescription to your  pharmacy, to reduce the repaglinide. Please continue the same other diabetes medications.  Please come back for a follow-up appointment in 4 months.

## 2019-10-03 NOTE — Telephone Encounter (Signed)
Urine drug screen for this encounter is consistent for prescribed medication 

## 2019-10-03 NOTE — Patient Instructions (Signed)
Your blood pressure is high today.  Please see your primary care provider soon, to have it rechecked. I have sent a prescription to your pharmacy, to reduce the repaglinide. Please continue the same other diabetes medications.  Please come back for a follow-up appointment in 4 months.

## 2019-10-17 ENCOUNTER — Telehealth: Payer: Self-pay

## 2019-10-17 NOTE — Telephone Encounter (Signed)

## 2019-10-18 ENCOUNTER — Encounter: Payer: Self-pay | Admitting: Family Medicine

## 2019-10-18 ENCOUNTER — Ambulatory Visit (INDEPENDENT_AMBULATORY_CARE_PROVIDER_SITE_OTHER): Payer: Medicare Other | Admitting: Family Medicine

## 2019-10-18 ENCOUNTER — Other Ambulatory Visit: Payer: Self-pay

## 2019-10-18 VITALS — BP 138/80 | HR 90 | Ht 73.0 in | Wt 221.0 lb

## 2019-10-18 DIAGNOSIS — D649 Anemia, unspecified: Secondary | ICD-10-CM

## 2019-10-18 DIAGNOSIS — K7460 Unspecified cirrhosis of liver: Secondary | ICD-10-CM

## 2019-10-18 DIAGNOSIS — N1832 Chronic kidney disease, stage 3b: Secondary | ICD-10-CM | POA: Diagnosis not present

## 2019-10-18 DIAGNOSIS — D7589 Other specified diseases of blood and blood-forming organs: Secondary | ICD-10-CM | POA: Diagnosis not present

## 2019-10-18 DIAGNOSIS — M109 Gout, unspecified: Secondary | ICD-10-CM

## 2019-10-18 DIAGNOSIS — I10 Essential (primary) hypertension: Secondary | ICD-10-CM

## 2019-10-18 LAB — B12 AND FOLATE PANEL
Folate: >24.1 ng/mL (ref >5.9)
Vitamin B-12: 284 pg/mL (ref 211–911)

## 2019-10-18 LAB — BASIC METABOLIC PANEL
BUN: 11 mg/dL (ref 6–23)
CO2: 25 mEq/L (ref 19–32)
Calcium: 10.3 mg/dL (ref 8.4–10.5)
Chloride: 103 mEq/L (ref 96–112)
Creatinine, Ser: 0.98 mg/dL (ref 0.40–1.50)
GFR: 73.68 mL/min (ref >60.00)
Glucose, Bld: 198 mg/dL — ABNORMAL HIGH (ref 70–99)
Potassium: 4.7 mEq/L (ref 3.5–5.1)
Sodium: 137 mEq/L (ref 135–145)

## 2019-10-18 MED ORDER — ALLOPURINOL 100 MG PO TABS: ORAL_TABLET | ORAL | 2 refills | Status: DC

## 2019-10-18 NOTE — Progress Notes (Signed)
Established Patient Office Visit  Subjective:  Patient ID: Larry Robertson, male    DOB: 26-Sep-1941  Age: 78 y.o. MRN: [id number]  CC:  Chief Complaint  Patient presents with  . Follow-up    HPI Larry Robertson presents for follow-up of his hypertension, decrease in GFR, anemia on the macrocytic side and gouty arthritis.  Continues to take Azor, Lasix and spironolactone for his blood pressure control.  He has no history of ascites he tells me.  He has been out of the spironolactone and needs a refill.  Iron levels were elevated at last check.  He is taking no supplemental iron.  He is taking an over-the-counter multivitamin.  Currently taking allopurinol 100 mg daily.  He has B12 at home.  He has been hydrating well over the last month.  Past Medical History:  Diagnosis Date  . Arthritis   . Blood transfusion without reported diagnosis   . Colon polyps 05/19/2012  . 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 DIABETES MELLITUS, TYPE II 08/25/2007  . Diastolic dysfunction    on echo  . Diverticulosis   . ERECTILE DYSFUNCTION 08/25/2007  . 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 Heart murmur    but was not stated by DR Hilty  . Hepatitis    took treatment  . HEPATITIS C 08/25/2007  . HYPERTENSION 08/25/2007  . 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 HYPOGONADISM 09/27/2009    Past Surgical History:  Procedure Laterality Date  . ABDOMINAL HERNIA REPAIR  2000  . ANTERIOR CERVICAL DECOMP/DISCECTOMY FUSION Right 03/04/2018   Procedure: Cervical Three-Four, Cervical Six-Seven Right Side approach, Anterior cervical decompression/discectomy/fusion;  Surgeon: Erline Levine, MD;  Location: [place];  Service: Neurosurgery;  Laterality: Right;  right side anterior approach  . CHOLECYSTECTOMY  1999  . COLONOSCOPY    . LUMBAR FUSION  2005  . TONSILLECTOMY AND ADENOIDECTOMY    . UPPER GASTROINTESTINAL ENDOSCOPY      Family History  Problem Relation Age of Onset  . Stroke Mother    . Diabetes Maternal Grandmother   . Cancer Neg Hx   . Colon cancer Neg Hx   . Esophageal cancer Neg Hx   . Rectal cancer Neg Hx   . Stomach cancer Neg Hx     Social History   Socioeconomic History  . Marital status: Single    Spouse name: Not on file  . Number of children: 7  . Years of education: Not on file  . Highest education level: Not on file  Occupational History  . 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 Financial resource strain: Not on file  . Food insecurity    Worry: Not on file    Inability: Not on file  . Transportation needs    Medical: Not on file    Non-medical: Not on file  Tobacco Use  . Smoking status: Never Smoker  . Smokeless tobacco: Never Used  Substance and Sexual Activity  . Alcohol use: Not Currently    Comment: Quit 3 months ago in July 2020.  Marland Kitchen Drug use: No  . Sexual activity: Not on file  Lifestyle  . Physical activity    Days per week: Not on file    Minutes per session: Not on file  . Stress: Not on file  Relationships  . Social Herbalist on phone: Not on file    Gets together: Not on file  Attends religious service: Not on file    Active member of club or organization: Not on file    Attends meetings of clubs or organizations: Not on file    Relationship status: Not on file  . Intimate partner violence    Fear of current or ex partner: Not on file    Emotionally abused: Not on file    Physically abused: Not on file    Forced sexual activity: Not on file  Other Topics Concern  . Not on file  Social History Narrative   Divorced    Outpatient Medications Prior to Visit  Medication Sig Dispense Refill  . ACCU-CHEK AVIVA PLUS test strip USE AS INSTRUCTED ONCE DAILY 50 strip 7  . amLODipine-olmesartan (AZOR) 10-40 MG tablet TAKE 1 TABLET BY MOUTH EVERY DAY 90 tablet 1  . atorvastatin (LIPITOR) 20 MG tablet Take 1 tablet (20 mg total) by mouth at bedtime. 90 tablet 1  .  colchicine 0.6 MG tablet 1 tablet every hour as needed for gout, not to exceed 6/day 6 tablet 4  . DULoxetine (CYMBALTA) 60 MG capsule TAKE 1 CAPSULE BY MOUTH EVERY DAY 90 capsule 2  . furosemide (LASIX) 40 MG tablet TAKE 1 TABLET BY MOUTH EVERY DAY 90 tablet 0  . JANUVIA 100 MG tablet TAKE 1 TABLET BY MOUTH EVERY DAY 90 tablet 3  . metFORMIN (GLUCOPHAGE) 1000 MG tablet TAKE 1 TABLET BY MOUTH 2 TIMES DAILY WITH A MEAL. 180 tablet 1  . oxyCODONE-acetaminophen (PERCOCET) 10-325 MG tablet Take 1 tablet by mouth 3 (three) times daily as needed for pain. 90 tablet 0  . predniSONE (DELTASONE) 5 MG tablet Take 6 pills for first day, 5 pills second day, 4 pills third day, 3 pills fourth day, 2 pills the fifth day, and 1 pill sixth day. 21 tablet 0  . repaglinide (PRANDIN) 0.5 MG tablet Take 1 tablet (0.5 mg total) by mouth 3 (three) times daily before meals. 270 tablet 3  . spironolactone (ALDACTONE) 25 MG tablet TAKE 1 TABLET BY MOUTH EVERY DAY 90 tablet 1  . traZODone (DESYREL) 100 MG tablet Take 1 tablet by mouth at bedtime.    Marland Kitchen allopurinol (ZYLOPRIM) 100 MG tablet TAKE 1 TABLET BY MOUTH EVERY DAY 30 tablet 1   No facility-administered medications prior to visit.     Allergies  Allergen Reactions  . Lyrica [Pregabalin] Other (See Comments)    Blurred vision  . Gabapentin Rash and Other (See Comments)    Diplopia (double vision)    ROS Review of Systems  Constitutional: Negative.   HENT: Negative.   Eyes: Negative for photophobia and visual disturbance.  Respiratory: Negative.   Cardiovascular: Negative.   Gastrointestinal: Negative.   Endocrine: Negative for polyphagia and polyuria.  Genitourinary: Negative for decreased urine volume, difficulty urinating and frequency.  Musculoskeletal: Positive for arthralgias.  Skin: Negative for pallor.  Allergic/Immunologic: Negative for immunocompromised state.  Neurological: Negative for light-headedness and headaches.  Hematological: Does not  bruise/bleed easily.  Psychiatric/Behavioral: Negative.       Objective:    Physical Exam  Constitutional: He is oriented to person, place, and time. He appears well-developed and well-nourished. No distress.  HENT:  Head: Normocephalic and atraumatic.  Right Ear: External ear normal.  Left Ear: External ear normal.  Eyes: Conjunctivae are normal. Right eye exhibits no discharge. Left eye exhibits no discharge. No scleral icterus.  Neck: No JVD present. No tracheal deviation present.  Cardiovascular: Normal rate, regular rhythm and normal  heart sounds.  Pulmonary/Chest: Effort normal and breath sounds normal. No stridor.  Musculoskeletal:        General: No edema.  Neurological: He is alert and oriented to person, place, and time.  Skin: Skin is warm and dry. He is not diaphoretic.  Psychiatric: He has a normal mood and affect. His behavior is normal.    BP 138/80   Pulse 90   Ht 6\' 1"  (1.854 m)   Wt 221 lb (100.2 kg)   SpO2 96%   BMI 29.16 kg/m  Wt Readings from Last 3 Encounters:  10/18/19 221 lb (100.2 kg)  10/03/19 215 lb 12.8 oz (97.9 kg)  09/27/19 216 lb (98 kg)   BP Readings from Last 3 Encounters:  10/18/19 138/80  10/03/19 (!) 142/72  09/27/19 112/72   Guideline developer:  UpToDate (see UpToDate for funding source) Date Released: June 2014  Health Maintenance Due  Topic Date Due  . OPHTHALMOLOGY EXAM  09/01/2019    There are no preventive care reminders to display for this patient.  Lab Results  Component Value Date   TSH 3.38 09/15/2019   Lab Results  Component Value Date   WBC 9.7 09/15/2019   HGB 12.9 (L) 09/15/2019   HCT 38.5 (L) 09/15/2019   MCV 92.5 09/15/2019   PLT 211.0 09/15/2019   Lab Results  Component Value Date   NA 134 (L) 09/15/2019   K 3.9 09/15/2019   CO2 19 09/15/2019   GLUCOSE 114 (H) 09/15/2019   BUN 36 (H) 09/15/2019   CREATININE 1.44 09/15/2019   BILITOT 0.5 09/15/2019   ALKPHOS 33 (L) 09/15/2019   AST 14  09/15/2019   ALT 20 09/15/2019   PROT 7.2 09/15/2019   ALBUMIN 4.8 09/15/2019   CALCIUM 9.7 09/15/2019   ANIONGAP 14 02/24/2018   GFR 47.27 (L) 09/15/2019   Lab Results  Component Value Date   CHOL 180 09/15/2019   Lab Results  Component Value Date   HDL 47.20 09/15/2019   Lab Results  Component Value Date   LDLCALC 39 01/21/2018   Lab Results  Component Value Date   TRIG (H) 09/15/2019    425.0 Triglyceride is over 400; calculations on Lipids are invalid.   Lab Results  Component Value Date   CHOLHDL 4 09/15/2019   Lab Results  Component Value Date   HGBA1C 5.9 (A) 10/03/2019      Assessment & Plan:   Problem List Items Addressed This Visit      Cardiovascular and Mediastinum   Essential hypertension   Relevant Orders   Basic Metabolic Panel (BMET)     Digestive   Cirrhosis of liver without ascites (HCC)     Musculoskeletal and Integument   Gouty arthropathy - Primary   Relevant Medications   allopurinol (ZYLOPRIM) 100 MG tablet     Genitourinary   Stage 3b chronic kidney disease   Relevant Orders   Basic Metabolic Panel (BMET)     Other   Anemia   Macrocytosis   Relevant Orders   B12 and Folate Panel      Meds ordered this encounter  Medications  . allopurinol (ZYLOPRIM) 100 MG tablet    Sig: Take one tablet every other day.    Dispense:  30 tablet    Refill:  2    Follow-up: Return next month.   Have decreased allopurinol to every other day.  He will start taking his B12 supplement.  Checking folate levels and repeat B12 levels today.  Encouraged to continue hydration and discussed the importance of maintaining hydration for support of renal function.  We will go ahead and discontinue spironolactone because of potential interaction with ARB to raise potassium.

## 2019-10-21 ENCOUNTER — Encounter: Payer: Self-pay | Admitting: Family Medicine

## 2019-10-21 ENCOUNTER — Other Ambulatory Visit: Payer: Self-pay

## 2019-10-21 ENCOUNTER — Ambulatory Visit (INDEPENDENT_AMBULATORY_CARE_PROVIDER_SITE_OTHER): Payer: Medicare Other | Admitting: Family Medicine

## 2019-10-21 DIAGNOSIS — M170 Bilateral primary osteoarthritis of knee: Secondary | ICD-10-CM

## 2019-10-21 NOTE — Patient Instructions (Signed)
Good to see you  Please send me a message in MyChart with any questions or updates.  Please see [place] back as needed.   --Dr. Raeford Razor

## 2019-10-21 NOTE — Assessment & Plan Note (Addendum)
Has completed a series of gel injections and has received steroid injections a few months ago.  Limited improvement with interventions thus far.  Uric acid is still elevated. - counseled on supportive care  -Could consider titration up of allopurinol. -Could consider steroid injections going forward.

## 2019-10-21 NOTE — Progress Notes (Signed)
Larry Robertson - 78 y.o. male MRN [id number]  Date of birth: 10/22/1941  SUBJECTIVE:  Including CC & ROS.  Chief Complaint  Patient presents with  . Follow-up    follow up for bilateral knee    Larry Robertson is a 79 y.o. male that is following up for his bilateral knee pain.  He has received steroid injections with mild improvement.  He is also finished a series of gel injections.  He has been placed on allopurinol but his uric acid continues to be elevated.  The pain is moderate in severity.  It is constant.  The cold seems to make his pain worse as well.    Review of Systems  Constitutional: Negative for fever.  HENT: Negative for congestion.   Respiratory: Negative for cough.   Cardiovascular: Negative for chest pain.  Gastrointestinal: Negative for abdominal pain.  Musculoskeletal: Positive for arthralgias.  Skin: Negative for color change.  Neurological: Negative for weakness.  Hematological: Negative for adenopathy.    HISTORY: Past Medical, Surgical, Social, and Family History Reviewed & Updated per EMR.   Pertinent Historical Findings include:  Past Medical History:  Diagnosis Date  . Arthritis   . Blood transfusion without reported diagnosis   . Colon polyps 05/19/2012  . 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 DIABETES MELLITUS, TYPE II 08/25/2007  . Diastolic dysfunction    on echo  . Diverticulosis   . ERECTILE DYSFUNCTION 08/25/2007  . 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 Heart murmur    but was not stated by DR Hilty  . Hepatitis    took treatment  . HEPATITIS C 08/25/2007  . HYPERTENSION 08/25/2007  . 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 HYPOGONADISM 09/27/2009    Past Surgical History:  Procedure Laterality Date  . ABDOMINAL HERNIA REPAIR  2000  . ANTERIOR CERVICAL DECOMP/DISCECTOMY FUSION Right 03/04/2018   Procedure: Cervical Three-Four, Cervical Six-Seven Right Side approach, Anterior cervical decompression/discectomy/fusion;   Surgeon: Erline Levine, MD;  Location: [facility];  Service: Neurosurgery;  Laterality: Right;  right side anterior approach  . CHOLECYSTECTOMY  1999  . COLONOSCOPY    . LUMBAR FUSION  2005  . TONSILLECTOMY AND ADENOIDECTOMY    . UPPER GASTROINTESTINAL ENDOSCOPY      Allergies  Allergen Reactions  . Lyrica [Pregabalin] Other (See Comments)    Blurred vision  . Gabapentin Rash and Other (See Comments)    Diplopia (double vision)    Family History  Problem Relation Age of Onset  . Stroke Mother   . Diabetes Maternal Grandmother   . Cancer Neg Hx   . Colon cancer Neg Hx   . Esophageal cancer Neg Hx   . Rectal cancer Neg Hx   . Stomach cancer Neg Hx      Social History   Socioeconomic History  . Marital status: Single    Spouse name: Not on file  . Number of children: 7  . Years of education: Not on file  . Highest education level: Not on file  Occupational History  . 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 Financial resource strain: Not on file  . Food insecurity    Worry: Not on file    Inability: Not on file  . Transportation needs    Medical: Not on file    Non-medical: Not on file  Tobacco Use  . Smoking status: Never Smoker  . Smokeless tobacco: Never Used  Substance  and Sexual Activity  . Alcohol use: Not Currently    Comment: Quit 3 months ago in July 2020.  Marland Kitchen Drug use: No  . Sexual activity: Not on file  Lifestyle  . Physical activity    Days per week: Not on file    Minutes per session: Not on file  . Stress: Not on file  Relationships  . Social Herbalist on phone: Not on file    Gets together: Not on file    Attends religious service: Not on file    Active member of club or organization: Not on file    Attends meetings of clubs or organizations: Not on file    Relationship status: Not on file  . Intimate partner violence    Fear of current or ex partner: Not on file    Emotionally abused: Not on file     Physically abused: Not on file    Forced sexual activity: Not on file  Other Topics Concern  . Not on file  Social History Narrative   Divorced     PHYSICAL EXAM:  VS: BP (!) 157/85   Pulse 66   Ht 6\' 1"  (1.854 m)   Wt 218 lb (98.9 kg)   BMI 28.76 kg/m  Physical Exam Gen: NAD, alert, cooperative with exam, well-appearing ENT: normal lips, normal nasal mucosa,  Eye: normal EOM, normal conjunctiva and lids CV:  no edema, +2 pedal pulses   Resp: no accessory muscle use, non-labored,  Skin: no rashes, no areas of induration  Neuro: normal tone, normal sensation to touch Psych:  normal insight, alert and oriented MSK:  Right and left knee: No obvious effusion. Normal range of motion. Normal strength resistance. No instability. Neurovascularly intact     ASSESSMENT & PLAN:   Primary osteoarthritis of both knees Has completed a series of gel injections and has received steroid injections a few months ago.  Limited improvement with interventions thus far.  Uric acid is still elevated. - counseled on supportive care  -Could consider titration up of allopurinol. -Could consider steroid injections going forward.

## 2019-10-25 ENCOUNTER — Encounter: Payer: Medicare Other | Attending: Physical Medicine & Rehabilitation | Admitting: Registered Nurse

## 2019-10-25 ENCOUNTER — Other Ambulatory Visit: Payer: Self-pay

## 2019-10-25 ENCOUNTER — Encounter: Payer: Self-pay | Admitting: Registered Nurse

## 2019-10-25 VITALS — BP 169/99 | HR 71 | Temp 97.9°F | Ht 73.0 in | Wt 225.2 lb

## 2019-10-25 DIAGNOSIS — I10 Essential (primary) hypertension: Secondary | ICD-10-CM | POA: Diagnosis not present

## 2019-10-25 DIAGNOSIS — M545 Low back pain, unspecified: Secondary | ICD-10-CM

## 2019-10-25 DIAGNOSIS — G894 Chronic pain syndrome: Secondary | ICD-10-CM

## 2019-10-25 DIAGNOSIS — Z79891 Long term (current) use of opiate analgesic: Secondary | ICD-10-CM | POA: Insufficient documentation

## 2019-10-25 DIAGNOSIS — M25519 Pain in unspecified shoulder: Secondary | ICD-10-CM | POA: Diagnosis not present

## 2019-10-25 DIAGNOSIS — Z9889 Other specified postprocedural states: Secondary | ICD-10-CM | POA: Diagnosis not present

## 2019-10-25 DIAGNOSIS — M25561 Pain in right knee: Secondary | ICD-10-CM

## 2019-10-25 DIAGNOSIS — Z5181 Encounter for therapeutic drug level monitoring: Secondary | ICD-10-CM | POA: Diagnosis present

## 2019-10-25 DIAGNOSIS — M24512 Contracture, left shoulder: Secondary | ICD-10-CM

## 2019-10-25 DIAGNOSIS — G8929 Other chronic pain: Secondary | ICD-10-CM

## 2019-10-25 DIAGNOSIS — M25562 Pain in left knee: Secondary | ICD-10-CM

## 2019-10-25 DIAGNOSIS — E119 Type 2 diabetes mellitus without complications: Secondary | ICD-10-CM | POA: Insufficient documentation

## 2019-10-25 DIAGNOSIS — M542 Cervicalgia: Secondary | ICD-10-CM

## 2019-10-25 DIAGNOSIS — M961 Postlaminectomy syndrome, not elsewhere classified: Secondary | ICD-10-CM | POA: Diagnosis not present

## 2019-10-25 MED ORDER — OXYCODONE-ACETAMINOPHEN 10-325 MG PO TABS: 1.0000 | ORAL_TABLET | Freq: Three times a day (TID) | ORAL | 0 refills | Status: DC | PRN

## 2019-10-25 NOTE — Progress Notes (Signed)
Subjective:    Patient ID: Larry Robertson, male    DOB: February 18, 1941, 78 y.o.   MRN: [id number]  HPI: Larry Robertson is a 78 y.o. male who returns for follow up appointment for chronic pain and medication refill. He states his pain is located in his neck, left shoulder, lower back pain and bilateral knee pain. He rates his pain 7. His current exercise regime is walking and using his Home [employer] 3- 4 days a week.   Larry Robertson Morphine equivalent is 45.00  MME.  UDS ordered today.   Average Pain 7 Pain Right Now 7 My pain is sharp and aching  In the last 24 hours, has pain interfered with the following? General activity 5 Relation with others 3 Enjoyment of life 6 What TIME of day is your pain at its worst? varies Sleep (in general) Fair  Pain is worse with: standing Pain improves with: medication Relief from Meds: 5  Mobility walk without assistance how many minutes can you walk? 20  Function retired  Neuro/Psych trouble walking  Prior Studies Any changes since last visit?  no  Physicians involved in your care Any changes since last visit?  no   Family History  Problem Relation Age of Onset  . Stroke Mother   . Diabetes Maternal Grandmother   . Cancer Neg Hx   . Colon cancer Neg Hx   . Esophageal cancer Neg Hx   . Rectal cancer Neg Hx   . Stomach cancer Neg Hx    Social History   Socioeconomic History  . Marital status: Single    Spouse name: Not on file  . Number of children: 7  . Years of education: Not on file  . Highest education level: Not on file  Occupational History  . 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 Financial resource strain: Not on file  . Food insecurity    Worry: Not on file    Inability: Not on file  . Transportation needs    Medical: Not on file    Non-medical: Not on file  Tobacco Use  . Smoking status: Never Smoker  . Smokeless tobacco: Never Used  Substance and Sexual Activity  . Alcohol  use: Not Currently    Comment: Quit 3 months ago in July 2020.  Marland Kitchen Drug use: No  . Sexual activity: Not on file  Lifestyle  . Physical activity    Days per week: Not on file    Minutes per session: Not on file  . Stress: Not on file  Relationships  . Social Herbalist on phone: Not on file    Gets together: Not on file    Attends religious service: Not on file    Active member of club or organization: Not on file    Attends meetings of clubs or organizations: Not on file    Relationship status: Not on file  Other Topics Concern  . Not on file  Social History Narrative   Divorced   Past Surgical History:  Procedure Laterality Date  . ABDOMINAL HERNIA REPAIR  2000  . ANTERIOR CERVICAL DECOMP/DISCECTOMY FUSION Right 03/04/2018   Procedure: Cervical Three-Four, Cervical Six-Seven Right Side approach, Anterior cervical decompression/discectomy/fusion;  Surgeon: Larry Levine, MD;  Location: [facility];  Service: Neurosurgery;  Laterality: Right;  right side anterior approach  . CHOLECYSTECTOMY  1999  . COLONOSCOPY    . LUMBAR FUSION  2005  .  TONSILLECTOMY AND ADENOIDECTOMY    . UPPER GASTROINTESTINAL ENDOSCOPY     Past Medical History:  Diagnosis Date  . Arthritis   . Blood transfusion without reported diagnosis   . Colon polyps 05/19/2012  . 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 DIABETES MELLITUS, TYPE II 08/25/2007  . Diastolic dysfunction    on echo  . Diverticulosis   . ERECTILE DYSFUNCTION 08/25/2007  . 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 Heart murmur    but was not stated by DR Hilty  . Hepatitis    took treatment  . HEPATITIS C 08/25/2007  . HYPERTENSION 08/25/2007  . 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 HYPOGONADISM 09/27/2009   BP (!) 171/95   Pulse 71   Temp 97.9 F (36.6 C)   Ht 6\' 1"  (1.854 m)   Wt 225 lb 3.2 oz (102.2 kg)   SpO2 95%   BMI 29.71 kg/m   Opioid Risk Score:   Fall Risk Score:  `1  Depression screen PHQ  2/9  Depression screen [facility] 2/9 03/09/2019 02/09/2019 01/11/2019 11/08/2018 11/05/2018 10/18/2018  Decreased Interest 0 0 0 0 0 0  Down, Depressed, Hopeless - 0 0 0 0 0  PHQ - 2 Score 0 0 0 0 0 0  Altered sleeping - - - - - 0  Tired, decreased energy - - - - - 2  Change in appetite - - - - - 0  Feeling bad or failure about yourself  - - - - - 0  Trouble concentrating - - - - - 0  Moving slowly or fidgety/restless - - - - - 0  Suicidal thoughts - - - - - 0  PHQ-9 Score - - - - - 2  Some recent data might be hidden     Review of Systems  All other systems reviewed and are negative.      Objective:   Physical Exam Vitals signs and nursing note reviewed.  Constitutional:      Appearance: Normal appearance.  Neck:     Musculoskeletal: Normal range of motion and neck supple.  Cardiovascular:     Rate and Rhythm: Normal rate and regular rhythm.     Pulses: Normal pulses.     Heart sounds: Normal heart sounds.  Pulmonary:     Effort: Pulmonary effort is normal.     Breath sounds: Normal breath sounds.  Musculoskeletal:     Comments: Normal Muscle Bulk and Muscle Testing Reveals:  Upper Extremities: Full ROM and Muscle Strength 5/5 Left AC Joint Tenderness Lumbar Paraspinal Tenderness: L-3-L-5 Lower Extremities: Full ROM and Muscle Strength 5/5 Arises from Chair Slowly Antalgic  Gait   Skin:    General: Skin is warm and dry.  Neurological:     Mental Status: He is alert and oriented to person, place, and time.           Assessment & Plan:  1.Cervical Postlaminectomy Syndrome/ Cervicalgia: Continue HEP as Tolerated. Continue to Monitor.10/25/2019. 2. Contracture of Left Shoulder Joint Region: Continue HEP as Tolerated.10/25/2019. 3. Chronic Low Back Pain/ Lumbar Radiculitis: Continue HEP as Tolerated. Continue to Monitor.10/25/2019 4. Chronic Pain Syndrome:Refilled:Oxycodone 10/325 mg one tablet three times a day as needed for pain #90.10/25/2019 We will continue the  opioid monitoring program, this consists of regular clinic visits, examinations, urine drug screen, pill counts as well as use of [place] Controlled Substance Reporting system. 5. Bilateral Chronic Knee Pain: Continue HEP as Tolerated. Continue to Monitor. 10/25/2019  2minutes of face to face patient care time was spent during this visit. All questions were encouraged and answered.  F/U in 1 month

## 2019-10-29 LAB — TOXASSURE SELECT,+ANTIDEPR,UR

## 2019-10-31 ENCOUNTER — Telehealth: Payer: Self-pay | Admitting: *Deleted

## 2019-10-31 NOTE — Telephone Encounter (Signed)
Urine drug screen for this encounter is inconsistent. He is negative for the prescribed medication oxycodone, but is positive for hydrocodone and its metabolites, which is not being prescribed. It is also positive for alcohol.  He received a formal warning for having alcohol in his uds previously on 11/22/18. He will be discharged from the clinic per Danella Sensing NP.

## 2019-11-11 ENCOUNTER — Encounter: Payer: Self-pay | Admitting: Family Medicine

## 2019-11-11 ENCOUNTER — Other Ambulatory Visit: Payer: Self-pay

## 2019-11-11 ENCOUNTER — Ambulatory Visit (INDEPENDENT_AMBULATORY_CARE_PROVIDER_SITE_OTHER): Payer: Medicare Other | Admitting: Family Medicine

## 2019-11-11 VITALS — BP 150/88 | HR 78 | Temp 97.3°F | Ht 73.0 in | Wt 226.2 lb

## 2019-11-11 DIAGNOSIS — G894 Chronic pain syndrome: Secondary | ICD-10-CM | POA: Diagnosis not present

## 2019-11-11 NOTE — Progress Notes (Signed)
Established Patient Office Visit  Subjective:  Patient ID: Larry Robertson, male    DOB: 15-Jun-1941  Age: 78 y.o. MRN: [id number]  CC:  Chief Complaint  Patient presents with  . Medication Management    pt. would like to discuss having pain medication refilled    HPI Larry Robertson presents for discussion about his chronic pain syndrome.  Patient has been followed by pain management for some time for his chronic pain issues.  He recently received notice back on 24 November that he would no longer be followed at that clinic because a surveillance urine drug screen detected alcohol and hydrocodone in his urine.  He is prescribed oxycodone through that clinic.  This patient has cirrhosis of the liver and assures me that he has not been drinking.  Past Medical History:  Diagnosis Date  . Arthritis   . Blood transfusion without reported diagnosis   . Colon polyps 05/19/2012  . 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 DIABETES MELLITUS, TYPE II 08/25/2007  . Diastolic dysfunction    on echo  . Diverticulosis   . ERECTILE DYSFUNCTION 08/25/2007  . 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 Heart murmur    but was not stated by DR Hilty  . Hepatitis    took treatment  . HEPATITIS C 08/25/2007  . HYPERTENSION 08/25/2007  . 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 HYPOGONADISM 09/27/2009    Past Surgical History:  Procedure Laterality Date  . ABDOMINAL HERNIA REPAIR  2000  . ANTERIOR CERVICAL DECOMP/DISCECTOMY FUSION Right 03/04/2018   Procedure: Cervical Three-Four, Cervical Six-Seven Right Side approach, Anterior cervical decompression/discectomy/fusion;  Surgeon: Erline Levine, MD;  Location: [facility];  Service: Neurosurgery;  Laterality: Right;  right side anterior approach  . CHOLECYSTECTOMY  1999  . COLONOSCOPY    . LUMBAR FUSION  2005  . TONSILLECTOMY AND ADENOIDECTOMY    . UPPER GASTROINTESTINAL ENDOSCOPY      Family History  Problem Relation Age of Onset  . Stroke  Mother   . Diabetes Maternal Grandmother   . Cancer Neg Hx   . Colon cancer Neg Hx   . Esophageal cancer Neg Hx   . Rectal cancer Neg Hx   . Stomach cancer Neg Hx     Social History   Socioeconomic History  . Marital status: Single    Spouse name: Not on file  . Number of children: 7  . Years of education: Not on file  . Highest education level: Not on file  Occupational History  . 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 Smoking status: Never Smoker  . Smokeless tobacco: Never Used  Substance and Sexual Activity  . Alcohol use: Not Currently    Comment: Quit 3 months ago in July 2020.  Marland Kitchen Drug use: No  . Sexual activity: Not on file  Other Topics Concern  . Not on file  Social History Narrative   Divorced   Social Determinants of Health   Financial Resource Strain:   . Difficulty of Paying Living Expenses: Not on file  Food Insecurity:   . Worried About Charity fundraiser in the Last Year: Not on file  . Ran Out of Food in the Last Year: Not on file  Transportation Needs:   . Lack of Transportation (Medical): Not on file  . Lack of Transportation (Non-Medical): Not on file  Physical Activity:   . Days of Exercise per Week: Not  on file  . Minutes of Exercise per Session: Not on file  Stress:   . Feeling of Stress : Not on file  Social Connections:   . Frequency of Communication with Friends and Family: Not on file  . Frequency of Social Gatherings with Friends and Family: Not on file  . Attends Religious Services: Not on file  . Active Member of Clubs or Organizations: Not on file  . Attends Archivist Meetings: Not on file  . Marital Status: Not on file  Intimate Partner Violence:   . Fear of Current or Ex-Partner: Not on file  . Emotionally Abused: Not on file  . Physically Abused: Not on file  . Sexually Abused: Not on file    Outpatient Medications Prior to Visit  Medication Sig Dispense Refill  . ACCU-CHEK  AVIVA PLUS test strip USE AS INSTRUCTED ONCE DAILY 50 strip 7  . allopurinol (ZYLOPRIM) 100 MG tablet Take one tablet every other day. 30 tablet 2  . amLODipine-olmesartan (AZOR) 10-40 MG tablet TAKE 1 TABLET BY MOUTH EVERY DAY 90 tablet 1  . colchicine 0.6 MG tablet 1 tablet every hour as needed for gout, not to exceed 6/day 6 tablet 4  . furosemide (LASIX) 40 MG tablet TAKE 1 TABLET BY MOUTH EVERY DAY 90 tablet 0  . JANUVIA 100 MG tablet TAKE 1 TABLET BY MOUTH EVERY DAY 90 tablet 3  . metFORMIN (GLUCOPHAGE) 1000 MG tablet TAKE 1 TABLET BY MOUTH 2 TIMES DAILY WITH A MEAL. 180 tablet 1  . oxyCODONE-acetaminophen (PERCOCET) 10-325 MG tablet Take 1 tablet by mouth 3 (three) times daily as needed for pain. 90 tablet 0  . repaglinide (PRANDIN) 0.5 MG tablet Take 1 tablet (0.5 mg total) by mouth 3 (three) times daily before meals. 270 tablet 3  . atorvastatin (LIPITOR) 20 MG tablet Take 1 tablet (20 mg total) by mouth at bedtime. (Patient not taking: Reported on 11/11/2019) 90 tablet 1  . DULoxetine (CYMBALTA) 60 MG capsule TAKE 1 CAPSULE BY MOUTH EVERY DAY (Patient not taking: Reported on 11/11/2019) 90 capsule 2  . predniSONE (DELTASONE) 5 MG tablet Take 6 pills for first day, 5 pills second day, 4 pills third day, 3 pills fourth day, 2 pills the fifth day, and 1 pill sixth day. (Patient not taking: Reported on 11/11/2019) 21 tablet 0  . traZODone (DESYREL) 100 MG tablet Take 1 tablet by mouth at bedtime.     No facility-administered medications prior to visit.    Allergies  Allergen Reactions  . Lyrica [Pregabalin] Other (See Comments)    Blurred vision  . Gabapentin Rash and Other (See Comments)    Diplopia (double vision)    ROS Review of Systems  Constitutional: Negative.   Respiratory: Negative.   Cardiovascular: Negative.   Gastrointestinal: Negative.   Musculoskeletal: Positive for arthralgias. Negative for gait problem.  Psychiatric/Behavioral: Negative.       Objective:      Physical Exam  Constitutional: He is oriented to person, place, and time. He appears well-developed and well-nourished. No distress.  HENT:  Head: Normocephalic and atraumatic.  Right Ear: External ear normal.  Left Ear: External ear normal.  Eyes: Conjunctivae are normal. Right eye exhibits no discharge. Left eye exhibits no discharge. No scleral icterus.  Pulmonary/Chest: Effort normal.  Neurological: He is alert and oriented to person, place, and time.  Skin: Skin is warm and dry. He is not diaphoretic.     Psychiatric: He has a normal mood and  affect. His behavior is normal.    BP (!) 150/88   Pulse 78   Temp (!) 97.3 F (36.3 C) (Temporal)   Ht 6\' 1"  (1.854 m)   Wt 226 lb 3.2 oz (102.6 kg)   SpO2 96%   BMI 29.84 kg/m  Wt Readings from Last 3 Encounters:  11/11/19 226 lb 3.2 oz (102.6 kg)  10/25/19 225 lb 3.2 oz (102.2 kg)  10/21/19 218 lb (98.9 kg)     Health Maintenance Due  Topic Date Due  . OPHTHALMOLOGY EXAM  09/01/2019    There are no preventive care reminders to display for this patient.  Lab Results  Component Value Date   TSH 3.38 09/15/2019   Lab Results  Component Value Date   WBC 9.7 09/15/2019   HGB 12.9 (L) 09/15/2019   HCT 38.5 (L) 09/15/2019   MCV 92.5 09/15/2019   PLT 211.0 09/15/2019   Lab Results  Component Value Date   NA 137 10/18/2019   K 4.7 10/18/2019   CO2 25 10/18/2019   GLUCOSE 198 (H) 10/18/2019   BUN 11 10/18/2019   CREATININE 0.98 10/18/2019   BILITOT 0.5 09/15/2019   ALKPHOS 33 (L) 09/15/2019   AST 14 09/15/2019   ALT 20 09/15/2019   PROT 7.2 09/15/2019   ALBUMIN 4.8 09/15/2019   CALCIUM 10.3 10/18/2019   ANIONGAP 14 02/24/2018   GFR 73.68 10/18/2019   Lab Results  Component Value Date   CHOL 180 09/15/2019   Lab Results  Component Value Date   HDL 47.20 09/15/2019   Lab Results  Component Value Date   LDLCALC 39 01/21/2018   Lab Results  Component Value Date   TRIG (H) 09/15/2019    425.0  Triglyceride is over 400; calculations on Lipids are invalid.   Lab Results  Component Value Date   CHOLHDL 4 09/15/2019   Lab Results  Component Value Date   HGBA1C 5.9 (A) 10/03/2019      Assessment & Plan:   Problem List Items Addressed This Visit      Other   Chronic pain syndrome - Primary   Relevant Orders   Ambulatory referral to Pain Clinic      No orders of the defined types were placed in this encounter.  Referred patient to [place] [facility] on [address].  [id number]. Follow-up: Return Has follow-up in January..  Did not repeat the urine drug screen because it has been over 2 weeks since it was obtained.  Urine drug screen was reviewed.  Patient has a follow-up appointment with me in January.  Libby Maw, MD

## 2019-11-23 ENCOUNTER — Ambulatory Visit: Payer: Medicare Other | Admitting: Registered Nurse

## 2019-12-14 IMAGING — MG DIGITAL DIAGNOSTIC BILAT W/ TOMO
6 of 10 series · 6 of 30 positions shown · non-contrast
Comparison: None.

ACR Breast Density Category a: The breast tissue is almost entirely
fatty.

CLINICAL DATA: 78-year-old male presenting for evaluation of a
palpable lump and swelling in the retroareolar left breast.

EXAM:
DIGITAL DIAGNOSTIC BILATERAL MAMMOGRAM WITH CAD AND TOMO

[L CC synth-2D]
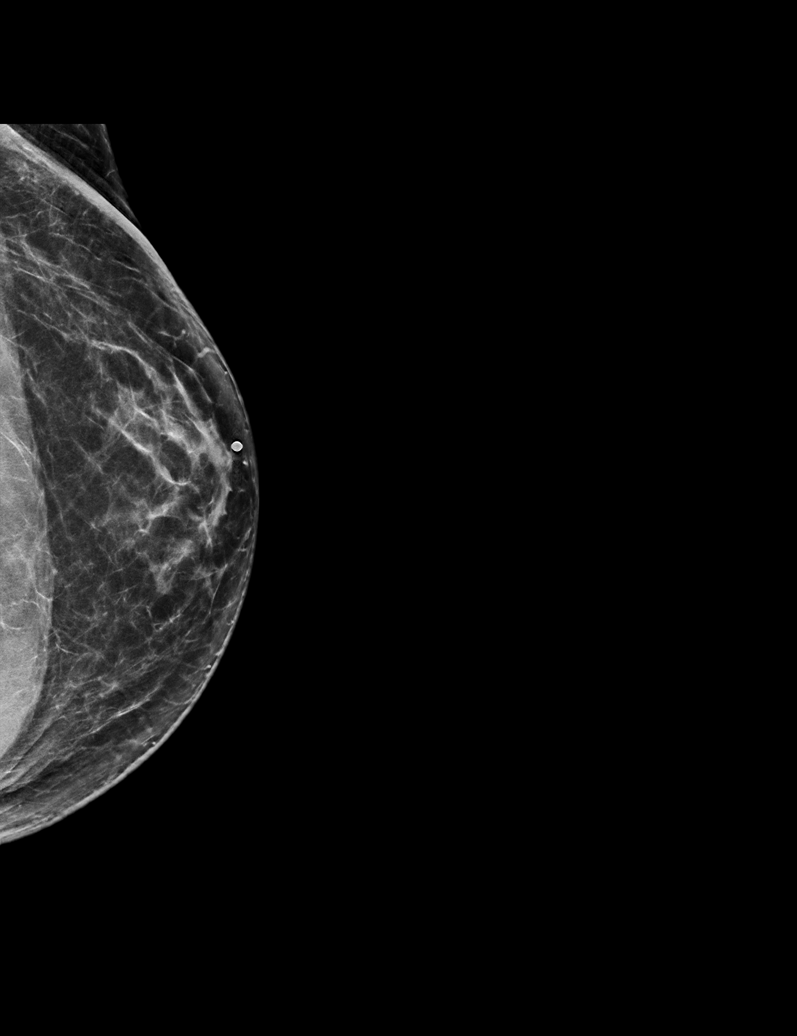

[L TAN synth-2D]
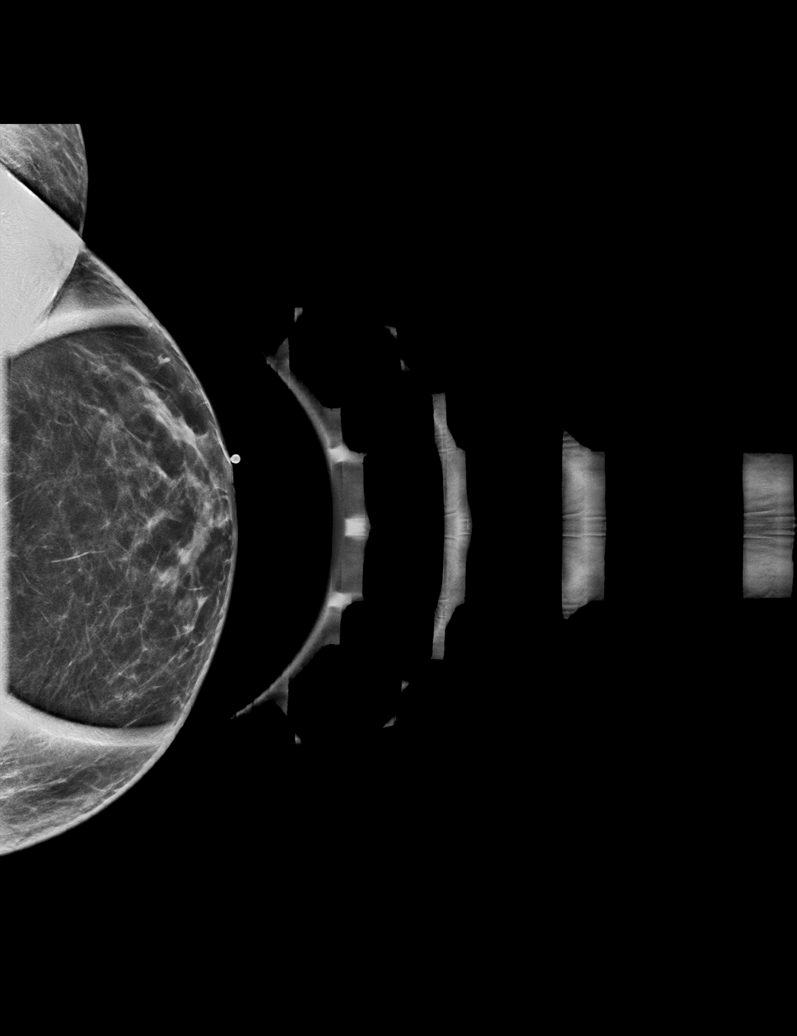

[R CC synth-2D]
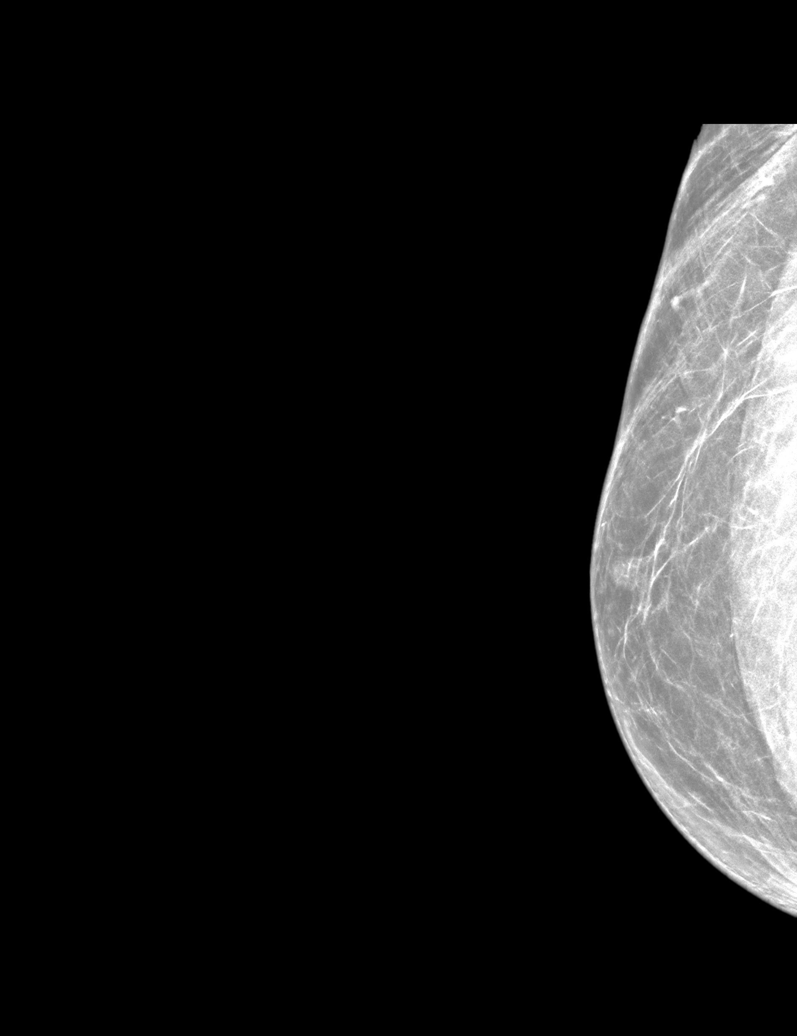

[R MLO synth-2D]
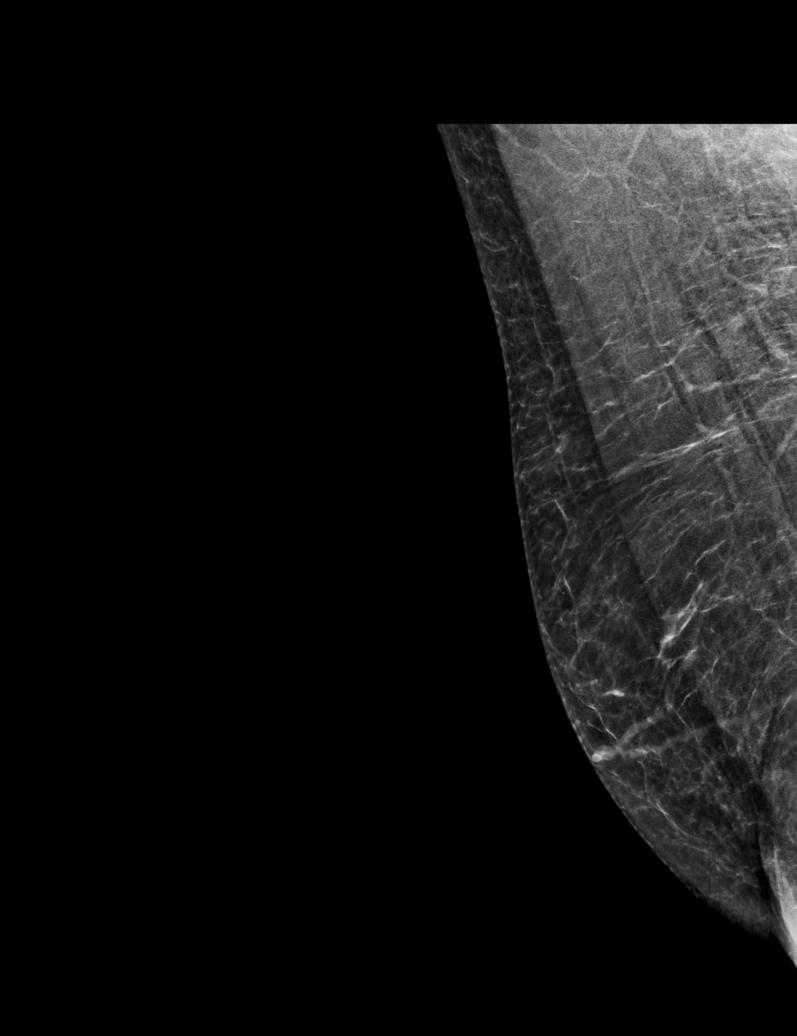

[L MLO synth-2D]
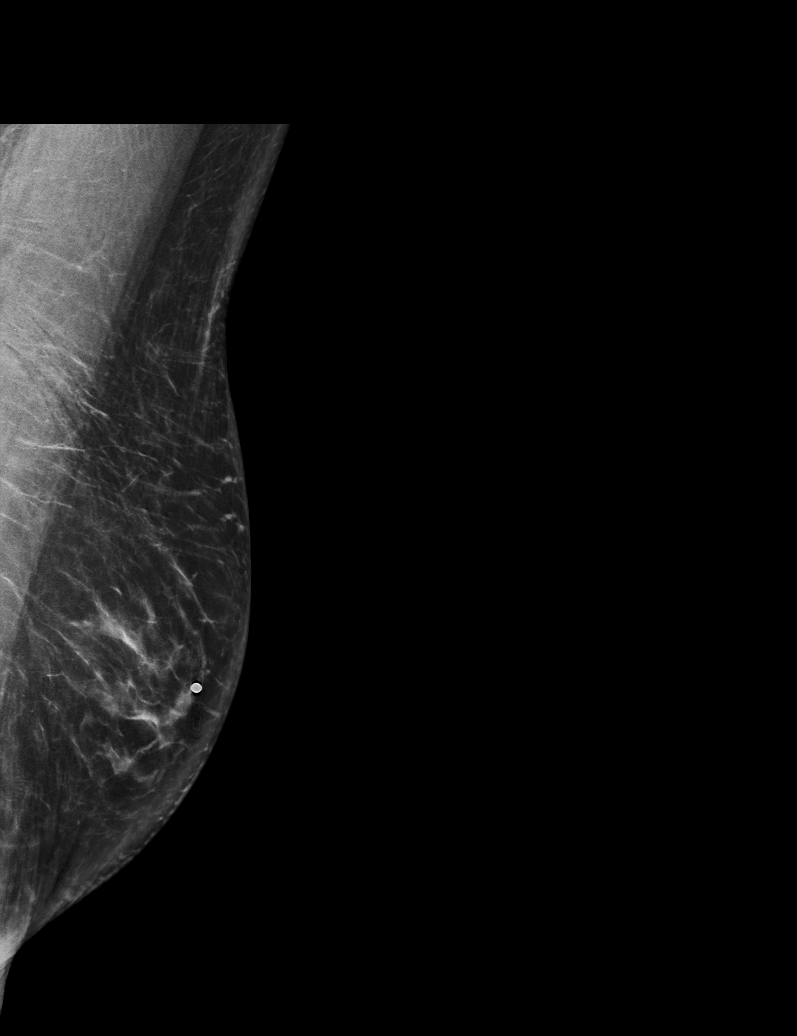

[L CC tomo · tomo slice 31/62.0]
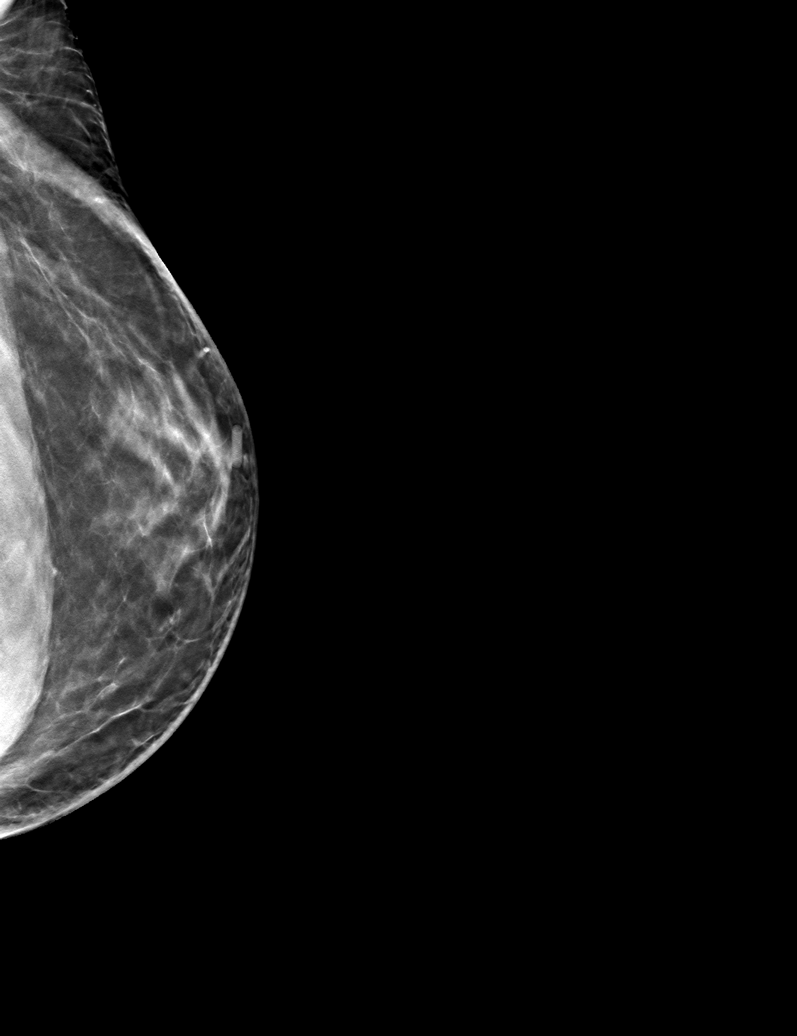

[6 of 30 positions shown; findings below may reference images not displayed]

FINDINGS: Flame shaped fibroglandular tissue is seen in the retroareolar left
breast. No suspicious calcifications, masses or areas of distortion
are seen in the bilateral breasts.

Mammographic images were processed with CAD.
IMPRESSION: 1. The palpable lump in the retroareolar left breast corresponds
with benign gynecomastia.

2.  No mammographic evidence of malignancy in the bilateral breasts.

RECOMMENDATION:
Clinical follow-up.

I have discussed the findings and recommendations with the patient.
If applicable, a reminder letter will be sent to the patient
regarding the next appointment.

BI-RADS CATEGORY  2: Benign.

## 2019-12-15 ENCOUNTER — Other Ambulatory Visit: Payer: Self-pay

## 2019-12-16 ENCOUNTER — Ambulatory Visit (INDEPENDENT_AMBULATORY_CARE_PROVIDER_SITE_OTHER): Payer: Medicare Other | Admitting: Family Medicine

## 2019-12-16 ENCOUNTER — Encounter: Payer: Self-pay | Admitting: Family Medicine

## 2019-12-16 VITALS — BP 158/84 | HR 74 | Temp 97.8°F | Wt 225.2 lb

## 2019-12-16 DIAGNOSIS — E538 Deficiency of other specified B group vitamins: Secondary | ICD-10-CM | POA: Diagnosis not present

## 2019-12-16 DIAGNOSIS — K7460 Unspecified cirrhosis of liver: Secondary | ICD-10-CM | POA: Diagnosis not present

## 2019-12-16 DIAGNOSIS — N182 Chronic kidney disease, stage 2 (mild): Secondary | ICD-10-CM | POA: Diagnosis not present

## 2019-12-16 DIAGNOSIS — M109 Gout, unspecified: Secondary | ICD-10-CM | POA: Diagnosis not present

## 2019-12-16 DIAGNOSIS — D649 Anemia, unspecified: Secondary | ICD-10-CM | POA: Diagnosis not present

## 2019-12-16 LAB — CBC
HCT: 42.2 % (ref 39.0–52.0)
Hemoglobin: 14.1 g/dL (ref 13.0–17.0)
MCHC: 33.5 g/dL (ref 30.0–36.0)
MCV: 90.8 fl (ref 78.0–100.0)
Platelets: 183 10*3/uL (ref 150.0–400.0)
RBC: 4.64 Mil/uL (ref 4.22–5.81)
RDW: 12.7 % (ref 11.5–15.5)
WBC: 6.5 10*3/uL (ref 4.0–10.5)

## 2019-12-16 LAB — VITAMIN B12: Vitamin B-12: 401 pg/mL (ref 211–911)

## 2019-12-16 LAB — URIC ACID: Uric Acid, Serum: 7.1 mg/dL (ref 4.0–7.8)

## 2019-12-16 LAB — COMPREHENSIVE METABOLIC PANEL
ALT: 32 U/L (ref 0–53)
AST: 25 U/L (ref 0–37)
Albumin: 4.9 g/dL (ref 3.5–5.2)
Alkaline Phosphatase: 36 U/L — ABNORMAL LOW (ref 39–117)
BUN: 12 mg/dL (ref 6–23)
CO2: 25 mEq/L (ref 19–32)
Calcium: 9.8 mg/dL (ref 8.4–10.5)
Chloride: 101 mEq/L (ref 96–112)
Creatinine, Ser: 0.93 mg/dL (ref 0.40–1.50)
GFR: 78.23 mL/min (ref >60.00)
Glucose, Bld: 184 mg/dL — ABNORMAL HIGH (ref 70–99)
Potassium: 3.9 mEq/L (ref 3.5–5.1)
Sodium: 138 mEq/L (ref 135–145)
Total Bilirubin: 0.7 mg/dL (ref 0.2–1.2)
Total Protein: 7.2 g/dL (ref 6.0–8.3)

## 2019-12-16 MED ORDER — ALLOPURINOL 100 MG PO TABS: ORAL_TABLET | ORAL | 3 refills | Status: DC

## 2019-12-16 NOTE — Progress Notes (Signed)
Established Patient Office Visit  Subjective:  Patient ID: Larry Robertson, male    DOB: [birth year]  Age: 79 y.o. MRN: [id number]  CC:  Chief Complaint  Patient presents with  . Follow-up    Patient is here as advised for a 62-monthF/U for HTN & Gouty Arthropathy. At 10.15.20 visit he states that he quit ETOH 371-monthprior. On 11.2.20 his A1C was down to 5.9. He is not currently fasting.  He is still without ETOH.  His last eye exam was 10.2020. He states that he is without his Percocet but has a new pain doctor set up for 1.27.21. Per [place] [facility] Percocet last filled 11.29.2020 no red flags.    HPI RoKROY SPRUNGresents for follow-up of his B12 deficiency, gouty arthropathy with elevated uric acid, chronic kidney disease, cirrhosis, iron overload and anemia.  Patient reports sobriety over the last 2 to 3 months.  He TGs have been elevated however.  Continues to take B12 complex.  Assures me he is no longer taking iron but does have iron at the house.  He has been hydrated well.  Has an appointment with his new pain doctor on the 27th.  Past Medical History:  Diagnosis Date  . Arthritis   . Blood transfusion without reported diagnosis   . Colon polyps 05/19/2012  . 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 DIABETES MELLITUS, TYPE II 08/25/2007  . Diastolic dysfunction    on echo  . Diverticulosis   . ERECTILE DYSFUNCTION 08/25/2007  . 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 Heart murmur    but was not stated by DR Hilty  . Hepatitis    took treatment  . HEPATITIS C 08/25/2007  . HYPERTENSION 08/25/2007  . 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 HYPOGONADISM 09/27/2009    Past Surgical History:  Procedure Laterality Date  . ABDOMINAL HERNIA REPAIR  2000  . ANTERIOR CERVICAL DECOMP/DISCECTOMY FUSION Right 03/04/2018   Procedure: Cervical Three-Four, Cervical Six-Seven Right Side approach, Anterior cervical decompression/discectomy/fusion;  Surgeon: StErline LevineMD;   [facility] Service: Neurosurgery;  Laterality: Right;  right side anterior approach  . CHOLECYSTECTOMY  1999  . COLONOSCOPY    . LUMBAR FUSION  2005  . TONSILLECTOMY AND ADENOIDECTOMY    . UPPER GASTROINTESTINAL ENDOSCOPY      Family History  Problem Relation Age of Onset  . Stroke Mother   . Diabetes Maternal Grandmother   . Cancer Neg Hx   . Colon cancer Neg Hx   . Esophageal cancer Neg Hx   . Rectal cancer Neg Hx   . Stomach cancer Neg Hx     Social History   Socioeconomic History  . Marital status: Single    Spouse name: Not on file  . Number of children: 7  . Years of education: Not on file  . Highest education level: Not on file  Occupational History  . 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 Smoking status: Never Smoker  . Smokeless tobacco: Never Used  Substance and Sexual Activity  . Alcohol use: Not Currently    Comment: Quit 3 months ago in July 2020.  . Marland Kitchenrug use: No  . Sexual activity: Not on file  Other Topics Concern  . Not on file  Social History Narrative   Divorced   Social Determinants of Health   Financial Resource Strain:   . Difficulty of Paying Living Expenses: Not on file  Food Insecurity:   . Worried About Charity fundraiser in the Last Year: Not on file  . Ran Out of Food in the Last Year: Not on file  Transportation Needs:   . Lack of Transportation (Medical): Not on file  . Lack of Transportation (Non-Medical): Not on file  Physical Activity:   . Days of Exercise per Week: Not on file  . Minutes of Exercise per Session: Not on file  Stress:   . Feeling of Stress : Not on file  Social Connections:   . Frequency of Communication with Friends and Family: Not on file  . Frequency of Social Gatherings with Friends and Family: Not on file  . Attends Religious Services: Not on file  . Active Member of Clubs or Organizations: Not on file  . Attends Archivist Meetings: Not on file  .  Marital Status: Not on file  Intimate Partner Violence:   . Fear of Current or Ex-Partner: Not on file  . Emotionally Abused: Not on file  . Physically Abused: Not on file  . Sexually Abused: Not on file    Outpatient Medications Prior to Visit  Medication Sig Dispense Refill  . ACCU-CHEK AVIVA PLUS test strip USE AS INSTRUCTED ONCE DAILY 50 strip 7  . amLODipine-olmesartan (AZOR) 10-40 MG tablet TAKE 1 TABLET BY MOUTH EVERY DAY 90 tablet 1  . atorvastatin (LIPITOR) 20 MG tablet Take 1 tablet (20 mg total) by mouth at bedtime. 90 tablet 1  . colchicine 0.6 MG tablet 1 tablet every hour as needed for gout, not to exceed 6/day 6 tablet 4  . DULoxetine (CYMBALTA) 60 MG capsule TAKE 1 CAPSULE BY MOUTH EVERY DAY 90 capsule 2  . JANUVIA 100 MG tablet TAKE 1 TABLET BY MOUTH EVERY DAY 90 tablet 3  . metFORMIN (GLUCOPHAGE) 1000 MG tablet TAKE 1 TABLET BY MOUTH 2 TIMES DAILY WITH A MEAL. 180 tablet 1  . repaglinide (PRANDIN) 0.5 MG tablet Take 1 tablet (0.5 mg total) by mouth 3 (three) times daily before meals. 270 tablet 3  . traZODone (DESYREL) 100 MG tablet Take 1 tablet by mouth at bedtime.    Marland Kitchen allopurinol (ZYLOPRIM) 100 MG tablet Take one tablet every other day. 30 tablet 2  . oxyCODONE-acetaminophen (PERCOCET) 10-325 MG tablet Take 1 tablet by mouth 3 (three) times daily as needed for pain. (Patient not taking: Reported on 12/16/2019) 90 tablet 0  . furosemide (LASIX) 40 MG tablet TAKE 1 TABLET BY MOUTH EVERY DAY 90 tablet 0  . predniSONE (DELTASONE) 5 MG tablet Take 6 pills for first day, 5 pills second day, 4 pills third day, 3 pills fourth day, 2 pills the fifth day, and 1 pill sixth day. (Patient not taking: Reported on 11/11/2019) 21 tablet 0   No facility-administered medications prior to visit.    Allergies  Allergen Reactions  . Lyrica [Pregabalin] Other (See Comments)    Blurred vision  . Gabapentin Rash and Other (See Comments)    Diplopia (double vision)    ROS Review of  Systems  Constitutional: Negative for chills, diaphoresis, fatigue, fever and unexpected weight change.  HENT: Negative.   Eyes: Negative for photophobia and visual disturbance.  Respiratory: Negative.   Cardiovascular: Negative.   Gastrointestinal: Negative.  Negative for anal bleeding and blood in stool.  Endocrine: Negative for polyphagia and polyuria.  Genitourinary: Negative for decreased urine volume and hematuria.  Musculoskeletal: Positive for arthralgias, back pain and myalgias.  Skin: Negative for pallor.  Neurological: Negative for speech difficulty and weakness.  Hematological: Does not bruise/bleed easily.  Psychiatric/Behavioral: Negative.       Objective:    Physical Exam  Constitutional: He is oriented to person, place, and time. He appears well-developed and well-nourished. No distress.  HENT:  Head: Normocephalic and atraumatic.  Right Ear: External ear normal.  Left Ear: External ear normal.  Eyes: Conjunctivae are normal. Right eye exhibits no discharge. Left eye exhibits no discharge. No scleral icterus.  Neck: No JVD present. No tracheal deviation present. No thyromegaly present.  Cardiovascular: Normal rate, regular rhythm and normal heart sounds.  Pulmonary/Chest: Effort normal and breath sounds normal. No stridor. He has no wheezes. He has no rales.  Musculoskeletal:        General: No edema.  Lymphadenopathy:    He has no cervical adenopathy.  Neurological: He is alert and oriented to person, place, and time.  Skin: Skin is warm and dry. He is not diaphoretic.  Psychiatric: He has a normal mood and affect. His behavior is normal.    BP (!) 158/84 (BP Location: Left Arm, Patient Position: Sitting, Cuff Size: Normal)   Pulse 74   Temp 97.8 F (36.6 C) (Oral)   Wt 225 lb 3.2 oz (102.2 kg)   SpO2 94%   BMI 29.71 kg/m  Wt Readings from Last 3 Encounters:  12/16/19 225 lb 3.2 oz (102.2 kg)  11/11/19 226 lb 3.2 oz (102.6 kg)  10/25/19 225 lb 3.2 oz  (102.2 kg)     Health Maintenance Due  Topic Date Due  . OPHTHALMOLOGY EXAM  09/01/2019    There are no preventive care reminders to display for this patient.  Lab Results  Component Value Date   TSH 3.38 09/15/2019   Lab Results  Component Value Date   WBC 9.7 09/15/2019   HGB 12.9 (L) 09/15/2019   HCT 38.5 (L) 09/15/2019   MCV 92.5 09/15/2019   PLT 211.0 09/15/2019   Lab Results  Component Value Date   NA 137 10/18/2019   K 4.7 10/18/2019   CO2 25 10/18/2019   GLUCOSE 198 (H) 10/18/2019   BUN 11 10/18/2019   CREATININE 0.98 10/18/2019   BILITOT 0.5 09/15/2019   ALKPHOS 33 (L) 09/15/2019   AST 14 09/15/2019   ALT 20 09/15/2019   PROT 7.2 09/15/2019   ALBUMIN 4.8 09/15/2019   CALCIUM 10.3 10/18/2019   ANIONGAP 14 02/24/2018   GFR 73.68 10/18/2019   Lab Results  Component Value Date   CHOL 180 09/15/2019   Lab Results  Component Value Date   HDL 47.20 09/15/2019   Lab Results  Component Value Date   LDLCALC 39 01/21/2018   Lab Results  Component Value Date   TRIG (H) 09/15/2019    425.0 Triglyceride is over 400; calculations on Lipids are invalid.   Lab Results  Component Value Date   CHOLHDL 4 09/15/2019   Lab Results  Component Value Date   HGBA1C 5.9 (A) 10/03/2019      Assessment & Plan:   Problem List Items Addressed This Visit      Digestive   Cirrhosis of liver without ascites ([place])   Relevant Orders   Comp Met (CMET)     Musculoskeletal and Integument   Gouty arthropathy - Primary   Relevant Medications   allopurinol (ZYLOPRIM) 100 MG tablet   Other Relevant Orders   Uric acid     Genitourinary   CKD (chronic kidney disease) stage 2, GFR 60-89  ml/min   Relevant Orders   Comp Met (CMET)     Other   Anemia   Relevant Orders   CBC   B12 deficiency   Relevant Orders   B12   Iron overload   Relevant Orders   Iron, TIBC and Ferritin Panel      Meds ordered this encounter  Medications  . allopurinol (ZYLOPRIM) 100  MG tablet    Sig: Take one tablet every  day.    Dispense:  30 tablet    Refill:  3    Follow-up: Return in about 3 months (around 03/15/2020).  Continue B12 supplement.  Be sure to stop all iron therapy.  Advised to start allopurinol 100 mg daily.  Follow-up in 3 months.  Libby Maw, MD

## 2019-12-17 LAB — IRON,TIBC AND FERRITIN PANEL
%SAT: 31 % (calc) (ref 20–48)
Ferritin: 94 ng/mL (ref 24–380)
Iron: 105 ug/dL (ref 50–180)
TIBC: 337 mcg/dL (calc) (ref 250–425)

## 2019-12-28 ENCOUNTER — Ambulatory Visit
Admission: RE | Admit: 2019-12-28 | Discharge: 2019-12-28 | Disposition: A | Discharge status: Home / Self Care | Payer: Medicare Other | Source: Ambulatory Visit | Attending: Pain Medicine | Admitting: Pain Medicine

## 2019-12-28 ENCOUNTER — Other Ambulatory Visit: Payer: Self-pay | Admitting: Pain Medicine

## 2019-12-28 DIAGNOSIS — M25512 Pain in left shoulder: Secondary | ICD-10-CM

## 2020-01-31 ENCOUNTER — Other Ambulatory Visit: Payer: Self-pay

## 2020-02-02 ENCOUNTER — Other Ambulatory Visit: Payer: Self-pay

## 2020-02-02 ENCOUNTER — Ambulatory Visit (INDEPENDENT_AMBULATORY_CARE_PROVIDER_SITE_OTHER): Payer: Medicare Other | Admitting: Endocrinology

## 2020-02-02 ENCOUNTER — Encounter: Payer: Self-pay | Admitting: Endocrinology

## 2020-02-02 VITALS — BP 172/98 | HR 77 | Ht 73.0 in | Wt 223.2 lb

## 2020-02-02 DIAGNOSIS — E119 Type 2 diabetes mellitus without complications: Secondary | ICD-10-CM

## 2020-02-02 LAB — POCT GLYCOSYLATED HEMOGLOBIN (HGB A1C): Hemoglobin A1C: 6.1 % — AB (ref 4.0–5.6)

## 2020-02-02 MED ORDER — REPAGLINIDE 0.5 MG PO TABS: 0.5000 mg | ORAL_TABLET | Freq: Two times a day (BID) | ORAL | 3 refills | Status: DC

## 2020-02-02 NOTE — Patient Instructions (Addendum)
Your blood pressure is high today.  Please see your primary care provider soon, to have it rechecked. I have sent a prescription to your pharmacy, to reduce the repaglinide again. Please continue the same other diabetes medications.  check your blood sugar once a day.  vary the time of day when you check, between before the 3 meals, and at bedtime.  also check if you have symptoms of your blood sugar being too high or too low.  please keep a record of the readings and bring it to your next appointment here (or you can bring the meter itself).  You can write it on any piece of paper.  please call us sooner if your blood sugar goes below 70, or if you have a lot of readings over 200. Please come back for a follow-up appointment in 4 months.

## 2020-02-02 NOTE — Progress Notes (Signed)
Subjective:    Patient ID: Larry Robertson, male    DOB: 05-14-1941, 79 y.o.   MRN: [id number]  HPI Pt returns for f/u of diabetes mellitus:  DM type: 2 Dx'ed: 2010.  Complications: renal insuff Therapy: 3 oral meds.  DKA: never.  Severe hypoglycemia: never.  Pancreatitis: never.  Other: edema and nocturia (he no longer takes d-DAVP), limit oral rx options; he has never taken insulin.  Interval history: no cbg record, but states cbg's are well-controlled.  he takes meds as rx'ed.  pt states he feels well in general.  Past Medical History:  Diagnosis Date  . Arthritis   . Blood transfusion without reported diagnosis   . Colon polyps 05/19/2012  . 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 DIABETES MELLITUS, TYPE II 08/25/2007  . Diastolic dysfunction    on echo  . Diverticulosis   . ERECTILE DYSFUNCTION 08/25/2007  . 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 Heart murmur    but was not stated by DR Hilty  . Hepatitis    took treatment  . HEPATITIS C 08/25/2007  . HYPERTENSION 08/25/2007  . 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 HYPOGONADISM 09/27/2009    Past Surgical History:  Procedure Laterality Date  . ABDOMINAL HERNIA REPAIR  2000  . ANTERIOR CERVICAL DECOMP/DISCECTOMY FUSION Right 03/04/2018   Procedure: Cervical Three-Four, Cervical Six-Seven Right Side approach, Anterior cervical decompression/discectomy/fusion;  Surgeon: Erline Levine, MD;  Location: [facility];  Service: Neurosurgery;  Laterality: Right;  right side anterior approach  . CHOLECYSTECTOMY  1999  . COLONOSCOPY    . LUMBAR FUSION  2005  . TONSILLECTOMY AND ADENOIDECTOMY    . UPPER GASTROINTESTINAL ENDOSCOPY      Social History   Socioeconomic History  . Marital status: Single    Spouse name: Not on file  . Number of children: 7  . Years of education: Not on file  . Highest education level: Not on file  Occupational History  . 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 Smoking status: Never Smoker  . Smokeless tobacco: Never Used  Substance and Sexual Activity  . Alcohol use: Not Currently    Comment: Quit 3 months ago in July 2020.  Marland Kitchen Drug use: No  . Sexual activity: Not on file  Other Topics Concern  . Not on file  Social History Narrative   Divorced   Social Determinants of Health   Financial Resource Strain:   . Difficulty of Paying Living Expenses: Not on file  Food Insecurity:   . Worried About Charity fundraiser in the Last Year: Not on file  . Ran Out of Food in the Last Year: Not on file  Transportation Needs:   . Lack of Transportation (Medical): Not on file  . Lack of Transportation (Non-Medical): Not on file  Physical Activity:   . Days of Exercise per Week: Not on file  . Minutes of Exercise per Session: Not on file  Stress:   . Feeling of Stress : Not on file  Social Connections:   . Frequency of Communication with Friends and Family: Not on file  . Frequency of Social Gatherings with Friends and Family: Not on file  . Attends Religious Services: Not on file  . Active Member of Clubs or Organizations: Not on file  . Attends Archivist Meetings: Not on file  . Marital Status: Not on file  Intimate Partner Violence:   .  Fear of Current or Ex-Partner: Not on file  . Emotionally Abused: Not on file  . Physically Abused: Not on file  . Sexually Abused: Not on file    Current Outpatient Medications on File Prior to Visit  Medication Sig Dispense Refill  . ACCU-CHEK AVIVA PLUS test strip USE AS INSTRUCTED ONCE DAILY 50 strip 7  . allopurinol (ZYLOPRIM) 100 MG tablet Take one tablet every  day. 30 tablet 3  . amLODipine-olmesartan (AZOR) 10-40 MG tablet TAKE 1 TABLET BY MOUTH EVERY DAY 90 tablet 1  . atorvastatin (LIPITOR) 20 MG tablet Take 1 tablet (20 mg total) by mouth at bedtime. 90 tablet 1  . colchicine 0.6 MG tablet 1 tablet every hour as needed for gout, not to exceed 6/day 6 tablet 4  .  DULoxetine (CYMBALTA) 60 MG capsule TAKE 1 CAPSULE BY MOUTH EVERY DAY 90 capsule 2  . JANUVIA 100 MG tablet TAKE 1 TABLET BY MOUTH EVERY DAY 90 tablet 3  . metFORMIN (GLUCOPHAGE) 1000 MG tablet TAKE 1 TABLET BY MOUTH 2 TIMES DAILY WITH A MEAL. 180 tablet 1  . oxyCODONE-acetaminophen (PERCOCET) 10-325 MG tablet Take 1 tablet by mouth 3 (three) times daily as needed for pain. 90 tablet 0  . traZODone (DESYREL) 100 MG tablet Take 1 tablet by mouth at bedtime.     No current facility-administered medications on file prior to visit.    Allergies  Allergen Reactions  . Lyrica [Pregabalin] Other (See Comments)    Blurred vision  . Gabapentin Rash and Other (See Comments)    Diplopia (double vision)    Family History  Problem Relation Age of Onset  . Stroke Mother   . Diabetes Maternal Grandmother   . Cancer Neg Hx   . Colon cancer Neg Hx   . Esophageal cancer Neg Hx   . Rectal cancer Neg Hx   . Stomach cancer Neg Hx     BP (!) 172/98 (BP Location: Left Arm, Patient Position: Sitting, Cuff Size: Normal)   Pulse 77   Ht 6\' 1"  (1.854 m)   Wt 223 lb 3.2 oz (101.2 kg)   SpO2 94%   BMI 29.45 kg/m    Review of Systems He denies hypoglycemia    Objective:   Physical Exam VITAL SIGNS:  See vs page GENERAL: no distress Pulses: dorsalis pedis intact bilat.   MSK: no deformity of the feet CV: 1+ bilat leg edema Skin:  no ulcer on the feet.  normal color and temp on the feet. Neuro: sensation is intact to touch on the feet   Lab Results  Component Value Date   HGBA1C 6.1 (A) 02/02/2020   Lab Results  Component Value Date   CREATININE 0.93 12/16/2019   BUN 12 12/16/2019   NA 138 12/16/2019   K 3.9 12/16/2019   CL 101 12/16/2019   CO2 25 12/16/2019       Assessment & Plan:  HTN: is noted today Type 2 DM: overcontrolled.  Edema: This limits rx options   Patient Instructions  Your blood pressure is high today.  Please see your primary care provider soon, to have it  rechecked. I have sent a prescription to your pharmacy, to reduce the repaglinide again. Please continue the same other diabetes medications.  check your blood sugar once a day.  vary the time of day when you check, between before the 3 meals, and at bedtime.  also check if you have symptoms of your blood sugar being too high  or too low.  please keep a record of the readings and bring it to your next appointment here (or you can bring the meter itself).  You can write it on any piece of paper.  please call us sooner if your blood sugar goes below 70, or if you have a lot of readings over 200. Please come back for a follow-up appointment in 4 months.

## 2020-02-13 ENCOUNTER — Telehealth: Payer: Self-pay | Admitting: Family Medicine

## 2020-02-13 NOTE — Telephone Encounter (Signed)
Left message for patient to schedule Annual Wellness Visit.  Please schedule with Nurse Health Advisor Victoria Britt, RN at [facility] [facility]  

## 2020-02-28 NOTE — Progress Notes (Signed)
Subjective:   Larry Robertson is a 79 y.o. male who presents for Medicare Annual/Subsequent preventive examination.  Review of Systems:  Home Safety/Smoke Alarms: Feels safe in home. Smoke alarms in place.  Lives alone in 1 story home.   Male:  CCS- 03/11/17. No longer doing routine screening due to age.     PSA-  Lab Results  Component Value Date   PSA 2.37 09/01/2018   PSA 1.91 06/08/2017   PSA 1.81 04/14/2016   Eye- pt reports UTD. Goes every October.      Objective:    Vitals: BP (!) 158/88 (BP Location: Left Arm, Patient Position: Sitting, Cuff Size: Normal)   Pulse 78   Temp 97.9 F (36.6 C) (Oral)   Ht 6\' 1"  (1.854 m)   Wt 220 lb (99.8 kg)   SpO2 97%   BMI 29.03 kg/m   Body mass index is 29.03 kg/m.  Advanced Directives 02/29/2020 09/01/2018 02/24/2018 03/11/2017 03/03/2017 10/15/2016 10/15/2015  Does Patient Have a Medical Advance Directive? No No No No No No No  Would patient like information on creating a medical advance directive? No - Patient declined - No - Patient declined - - - -    Tobacco Social History   Tobacco Use  Smoking Status Never Smoker  Smokeless Tobacco Never Used     Counseling given: Not Answered   Clinical Intake: Pain : No/denies pain     Past Medical History:  Diagnosis Date  . Arthritis   . Blood transfusion without reported diagnosis   . Colon polyps 05/19/2012  . 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 DIABETES MELLITUS, TYPE II 08/25/2007  . Diastolic dysfunction    on echo  . Diverticulosis   . ERECTILE DYSFUNCTION 08/25/2007  . 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 Heart murmur    but was not stated by DR Hilty  . Hepatitis    took treatment  . HEPATITIS C 08/25/2007  . HYPERTENSION 08/25/2007  . 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 HYPOGONADISM 09/27/2009   Past Surgical History:  Procedure Laterality Date  . ABDOMINAL HERNIA REPAIR  2000  . ANTERIOR CERVICAL DECOMP/DISCECTOMY FUSION  Right 03/04/2018   Procedure: Cervical Three-Four, Cervical Six-Seven Right Side approach, Anterior cervical decompression/discectomy/fusion;  Surgeon: Erline Levine, MD;  Location: [facility];  Service: Neurosurgery;  Laterality: Right;  right side anterior approach  . CHOLECYSTECTOMY  1999  . COLONOSCOPY    . LUMBAR FUSION  2005  . TONSILLECTOMY AND ADENOIDECTOMY    . UPPER GASTROINTESTINAL ENDOSCOPY     Family History  Problem Relation Age of Onset  . Stroke Mother   . Diabetes Maternal Grandmother   . Cancer Neg Hx   . Colon cancer Neg Hx   . Esophageal cancer Neg Hx   . Rectal cancer Neg Hx   . Stomach cancer Neg Hx    Social History   Socioeconomic History  . Marital status: Single    Spouse name: Not on file  . Number of children: 7  . Years of education: Not on file  . Highest education level: Not on file  Occupational History  . 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 Smoking status: Never Smoker  . Smokeless tobacco: Never Used  Substance and Sexual Activity  . Alcohol use: Not Currently    Comment: Quit 3 months ago in July 2020.  Marland Kitchen Drug use: No  . Sexual activity: Not on  file  Other Topics Concern  . Not on file  Social History Narrative   Divorced   Social Determinants of Health   Financial Resource Strain: Low Risk   . Difficulty of Paying Living Expenses: Not hard at all  Food Insecurity: No Food Insecurity  . Worried About Charity fundraiser in the Last Year: Never true  . Ran Out of Food in the Last Year: Never true  Transportation Needs: No Transportation Needs  . Lack of Transportation (Medical): No  . Lack of Transportation (Non-Medical): No  Physical Activity:   . Days of Exercise per Week:   . Minutes of Exercise per Session:   Stress:   . Feeling of Stress :   Social Connections:   . Frequency of Communication with Friends and Family:   . Frequency of Social Gatherings with Friends and Family:   . Attends  Religious Services:   . Active Member of Clubs or Organizations:   . Attends Archivist Meetings:   Marland Kitchen Marital Status:     Outpatient Encounter Medications as of 02/29/2020  Medication Sig  . ACCU-CHEK AVIVA PLUS test strip USE AS INSTRUCTED ONCE DAILY  . allopurinol (ZYLOPRIM) 100 MG tablet Take one tablet every  day. (Patient taking differently: Take one tablet every other  day.)  . amLODipine-olmesartan (AZOR) 10-40 MG tablet TAKE 1 TABLET BY MOUTH EVERY DAY  . DULoxetine (CYMBALTA) 60 MG capsule TAKE 1 CAPSULE BY MOUTH EVERY DAY  . JANUVIA 100 MG tablet TAKE 1 TABLET BY MOUTH EVERY DAY  . metFORMIN (GLUCOPHAGE) 1000 MG tablet TAKE 1 TABLET BY MOUTH 2 TIMES DAILY WITH A MEAL.  Marland Kitchen oxyCODONE-acetaminophen (PERCOCET) 10-325 MG tablet Take 1 tablet by mouth 3 (three) times daily as needed for pain.  . repaglinide (PRANDIN) 0.5 MG tablet Take 1 tablet (0.5 mg total) by mouth 2 (two) times daily before a meal.  . traZODone (DESYREL) 100 MG tablet Take 1 tablet by mouth at bedtime.  Marland Kitchen atorvastatin (LIPITOR) 20 MG tablet Take 1 tablet (20 mg total) by mouth at bedtime.  . colchicine 0.6 MG tablet 1 tablet every hour as needed for gout, not to exceed 6/day (Patient not taking: Reported on 02/29/2020)   No facility-administered encounter medications on file as of 02/29/2020.    Activities of Daily Living In your present state of health, do you have any difficulty performing the following activities: 02/29/2020 12/16/2019  Hearing? N N  Vision? N N  Difficulty concentrating or making decisions? N N  Walking or climbing stairs? N N  Dressing or bathing? N N  Doing errands, shopping? N N  Preparing Food and eating ? N -  Using the Toilet? N -  In the past six months, have you accidently leaked urine? N -  Do you have problems with loss of bowel control? N -  Managing your Medications? N -  Managing your Finances? N -  Housekeeping or managing your Housekeeping? N -  Some recent data  might be hidden    Patient Care Team: Libby Maw, MD as PCP - General (Family Medicine) Debara Pickett Nadean Corwin, MD as PCP - Cardiology (Cardiology) Earlean Polka, MD (Ophthalmology) Inda Castle, MD (Inactive) as Attending Physician (Gastroenterology) Renato Shin, MD as Consulting Physician (Endocrinology)   Assessment:   This is a routine wellness examination for [place]. Physical assessment deferred to PCP.  Exercise Activities and Dietary recommendations Current Exercise Habits: The patient does not participate in regular exercise at present,  Intensity: Mild, Exercise limited by: orthopedic condition(s)   Diet (meal preparation, eat out, water intake, caffeinated beverages, dairy products, fruits and vegetables): in general, a "healthy" diet  , well balanced  Goals    . Increase physical activity       Fall Risk Fall Risk  02/29/2020 10/25/2019 04/12/2019 03/09/2019 02/09/2019  Falls in the past year? 0 0 0 0 0  Number falls in past yr: 0 - - - -  Injury with Fall? 0 - - - -  Follow up Education provided;Falls prevention discussed - - - -   Depression Screen PHQ 2/9 Scores 02/29/2020 03/09/2019 02/09/2019 01/11/2019  PHQ - 2 Score 0 0 0 0  PHQ- 9 Score - - - -    Cognitive Function   Ad8 score reviewed for issues:  Issues making decisions:no  Less interest in hobbies / activities:no  Repeats questions, stories (family complaining):no  Trouble using ordinary gadgets (microwave, computer, phone):no  Forgets the month or year: no  Mismanaging finances: no  Remembering appts:no  Daily problems with thinking and/or memory:no Ad8 score is=0        Immunization History  Administered Date(s) Administered  . Fluad Quad(high Dose 65+) 09/15/2019  . Influenza, High Dose Seasonal PF 09/08/2017, 09/01/2018  . Influenza,inj,Quad PF,6+ Mos 08/09/2014, 10/15/2015, 10/15/2016  . Pneumococcal Conjugate-13 08/09/2014  . Pneumococcal Polysaccharide-23 02/16/2006  . Td  08/31/2000  . Tdap 08/09/2014  . Zoster 12/01/2009    Screening Tests Health Maintenance  Topic Date Due  . FOOT EXAM  01/31/2020  . HEMOGLOBIN A1C  08/04/2020  . OPHTHALMOLOGY EXAM  08/31/2020  . COLONOSCOPY  03/11/2022  . TETANUS/TDAP  08/09/2024  . INFLUENZA VACCINE  Completed  . PNA vac Low Risk Adult  Completed       Plan:    Please schedule your next medicare wellness visit with me in 1 yr.  Continue to eat heart healthy diet (full of fruits, vegetables, whole grains, lean protein, water--limit salt, fat, and sugar intake) and increase physical activity as tolerated.  Continue doing brain stimulating activities (puzzles, reading, adult coloring books, staying active) to keep memory sharp.   I have personally reviewed and noted the following in the patient's chart:   . Medical and social history . Use of alcohol, tobacco or illicit drugs  . Current medications and supplements . Functional ability and status . Nutritional status . Physical activity . Advanced directives . List of other physicians . Hospitalizations, surgeries, and ER visits in previous 12 months . Vitals . Screenings to include cognitive, depression, and falls . Referrals and appointments  In addition, I have reviewed and discussed with patient certain preventive protocols, quality metrics, and best practice recommendations. A written personalized care plan for preventive services as well as general preventive health recommendations were provided to patient.     Shela Nevin, [place]  02/29/2020

## 2020-02-29 ENCOUNTER — Ambulatory Visit (INDEPENDENT_AMBULATORY_CARE_PROVIDER_SITE_OTHER): Payer: Medicare Other | Admitting: *Deleted

## 2020-02-29 ENCOUNTER — Other Ambulatory Visit: Payer: Self-pay

## 2020-02-29 ENCOUNTER — Encounter: Payer: Self-pay | Admitting: *Deleted

## 2020-02-29 VITALS — BP 158/88 | HR 78 | Temp 97.9°F | Ht 73.0 in | Wt 220.0 lb

## 2020-02-29 DIAGNOSIS — Z0000 Encounter for general adult medical examination without abnormal findings: Secondary | ICD-10-CM | POA: Diagnosis not present

## 2020-02-29 NOTE — Patient Instructions (Signed)
Please schedule your next medicare wellness visit with me in 1 yr.  Continue to eat heart healthy diet (full of fruits, vegetables, whole grains, lean protein, water--limit salt, fat, and sugar intake) and increase physical activity as tolerated.  Continue doing brain stimulating activities (puzzles, reading, adult coloring books, staying active) to keep memory sharp.    Larry Robertson , Thank you for taking time to come for your Medicare Wellness Visit. I appreciate your ongoing commitment to your health goals. Please review the following plan we discussed and let me know if I can assist you in the future.   These are the goals we discussed: Goals    . Increase physical activity       This is a list of the screening recommended for you and due dates:  Health Maintenance  Topic Date Due  . Complete foot exam   01/31/2020  . Hemoglobin A1C  08/04/2020  . Eye exam for diabetics  08/31/2020  . Colon Cancer Screening  03/11/2022  . Tetanus Vaccine  08/09/2024  . Flu Shot  Completed  . Pneumonia vaccines  Completed    Preventive Care 80 Years and Older, Male Preventive care refers to lifestyle choices and visits with your health care provider that can promote health and wellness. This includes:  A yearly physical exam. This is also called an annual well check.  Regular dental and eye exams.  Immunizations.  Screening for certain conditions.  Healthy lifestyle choices, such as diet and exercise. What can I expect for my preventive care visit? Physical exam Your health care provider will check:  Height and weight. These may be used to calculate body mass index (BMI), which is a measurement that tells if you are at a healthy weight.  Heart rate and blood pressure.  Your skin for abnormal spots. Counseling Your health care provider may ask you questions about:  Alcohol, tobacco, and drug use.  Emotional well-being.  Home and relationship well-being.  Sexual  activity.  Eating habits.  History of falls.  Memory and ability to understand (cognition).  Work and work Statistician. What immunizations do I need?  Influenza (flu) vaccine  This is recommended every year. Tetanus, diphtheria, and pertussis (Tdap) vaccine  You may need a Td booster every 10 years. Varicella (chickenpox) vaccine  You may need this vaccine if you have not already been vaccinated. Zoster (shingles) vaccine  You may need this after age 73. Pneumococcal conjugate (PCV13) vaccine  One dose is recommended after age 46. Pneumococcal polysaccharide (PPSV23) vaccine  One dose is recommended after age 84. Measles, mumps, and rubella (MMR) vaccine  You may need at least one dose of MMR if you were born in 1957 or later. You may also need a second dose. Meningococcal conjugate (MenACWY) vaccine  You may need this if you have certain conditions. Hepatitis A vaccine  You may need this if you have certain conditions or if you travel or work in places where you may be exposed to hepatitis A. Hepatitis B vaccine  You may need this if you have certain conditions or if you travel or work in places where you may be exposed to hepatitis B. Haemophilus influenzae type b (Hib) vaccine  You may need this if you have certain conditions. You may receive vaccines as individual doses or as more than one vaccine together in one shot (combination vaccines). Talk with your health care provider about the risks and benefits of combination vaccines. What tests do I need? Blood  tests  Lipid and cholesterol levels. These may be checked every 5 years, or more frequently depending on your overall health.  Hepatitis C test.  Hepatitis B test. Screening  Lung cancer screening. You may have this screening every year starting at age 29 if you have a 30-pack-year history of smoking and currently smoke or have quit within the past 15 years.  Colorectal cancer screening. All adults  should have this screening starting at age 50 and continuing until age 11. Your health care provider may recommend screening at age 65 if you are at increased risk. You will have tests every 1-10 years, depending on your results and the type of screening test.  Prostate cancer screening. Recommendations will vary depending on your family history and other risks.  Diabetes screening. This is done by checking your blood sugar (glucose) after you have not eaten for a while (fasting). You may have this done every 1-3 years.  Abdominal aortic aneurysm (AAA) screening. You may need this if you are a current or former smoker.  Sexually transmitted disease (STD) testing. Follow these instructions at home: Eating and drinking  Eat a diet that includes fresh fruits and vegetables, whole grains, lean protein, and low-fat dairy products. Limit your intake of foods with high amounts of sugar, saturated fats, and salt.  Take vitamin and mineral supplements as recommended by your health care provider.  Do not drink alcohol if your health care provider tells you not to drink.  If you drink alcohol: ? Limit how much you have to 0-2 drinks a day. ? Be aware of how much alcohol is in your drink. In the [place] one drink equals one 12 oz bottle of beer (355 mL), one 5 oz glass of wine (148 mL), or one 1 oz glass of hard liquor (44 mL). Lifestyle  Take daily care of your teeth and gums.  Stay active. Exercise for at least 30 minutes on 5 or more days each week.  Do not use any products that contain nicotine or tobacco, such as cigarettes, e-cigarettes, and chewing tobacco. If you need help quitting, ask your health care provider.  If you are sexually active, practice safe sex. Use a condom or other form of protection to prevent STIs (sexually transmitted infections).  Talk with your health care provider about taking a low-dose aspirin or statin. What's next?  Visit your health care provider once a year  for a well check visit.  Ask your health care provider how often you should have your eyes and teeth checked.  Stay up to date on all vaccines. This information is not intended to replace advice given to you by your health care provider. Make sure you discuss any questions you have with your health care provider. Document Revised: 11/11/2018 Document Reviewed: 11/11/2018 Elsevier Patient Education  2020 [employer].

## 2020-03-07 ENCOUNTER — Other Ambulatory Visit: Payer: Self-pay | Admitting: Endocrinology

## 2020-03-08 ENCOUNTER — Ambulatory Visit: Payer: Medicare Other | Attending: Internal Medicine

## 2020-03-08 ENCOUNTER — Ambulatory Visit: Payer: Medicare Other

## 2020-03-08 DIAGNOSIS — Z23 Encounter for immunization: Secondary | ICD-10-CM

## 2020-03-08 NOTE — Progress Notes (Signed)
   Covid-19 Vaccination Clinic  Name:  Larry Robertson    MRN: [id number] DOB: 22-Jan-1941  03/08/2020  [name] was observed post Covid-19 immunization for 15 minutes without incident. He was provided with Vaccine Information Sheet and instruction to access the V-Safe system.   [name] was instructed to call 911 with any severe reactions post vaccine: Marland Kitchen Difficulty breathing  . Swelling of face and throat  . A fast heartbeat  . A bad rash all over body  . Dizziness and weakness   Immunizations Administered    Name Date Dose VIS Date Route   Pfizer COVID-19 Vaccine 03/08/2020  1:40 PM 0.3 mL 11/11/2019 Intramuscular   Manufacturer: [place]   Lot: [id number]   [place]: [id number]

## 2020-03-15 ENCOUNTER — Ambulatory Visit: Payer: Medicare Other | Admitting: Family Medicine

## 2020-03-26 ENCOUNTER — Ambulatory Visit (INDEPENDENT_AMBULATORY_CARE_PROVIDER_SITE_OTHER): Payer: Medicare Other | Admitting: Family Medicine

## 2020-03-26 ENCOUNTER — Encounter: Payer: Self-pay | Admitting: Family Medicine

## 2020-03-26 ENCOUNTER — Other Ambulatory Visit: Payer: Self-pay

## 2020-03-26 VITALS — BP 156/84 | HR 74 | Temp 98.0°F | Ht 73.0 in | Wt 218.8 lb

## 2020-03-26 DIAGNOSIS — I10 Essential (primary) hypertension: Secondary | ICD-10-CM

## 2020-03-26 MED ORDER — CLONIDINE HCL 0.1 MG PO TABS: 0.1000 mg | ORAL_TABLET | Freq: Two times a day (BID) | ORAL | 1 refills | Status: DC

## 2020-03-26 NOTE — Patient Instructions (Signed)
Managing Your Hypertension Hypertension is commonly called high blood pressure. This is when the force of your blood pressing against the walls of your arteries is too strong. Arteries are blood vessels that carry blood from your heart throughout your body. Hypertension forces the heart to work harder to pump blood, and may cause the arteries to become narrow or stiff. Having untreated or uncontrolled hypertension can cause heart attack, stroke, kidney disease, and other problems. What are blood pressure readings? A blood pressure reading consists of a higher number over a lower number. Ideally, your blood pressure should be below 120/80. The first ("top") number is called the systolic pressure. It is a measure of the pressure in your arteries as your heart beats. The second ("bottom") number is called the diastolic pressure. It is a measure of the pressure in your arteries as the heart relaxes. What does my blood pressure reading mean? Blood pressure is classified into four stages. Based on your blood pressure reading, your health care provider may use the following stages to determine what type of treatment you need, if any. Systolic pressure and diastolic pressure are measured in a unit called mm Hg. Normal  Systolic pressure: below 120.  Diastolic pressure: below 80. Elevated  Systolic pressure: 120-129.  Diastolic pressure: below 80. Hypertension stage 1  Systolic pressure: 130-139.  Diastolic pressure: 80-89. Hypertension stage 2  Systolic pressure: 140 or above.  Diastolic pressure: 90 or above. What health risks are associated with hypertension? Managing your hypertension is an important responsibility. Uncontrolled hypertension can lead to:  A heart attack.  A stroke.  A weakened blood vessel (aneurysm).  Heart failure.  Kidney damage.  Eye damage.  Metabolic syndrome.  Memory and concentration problems. What changes can I make to manage my  hypertension? Hypertension can be managed by making lifestyle changes and possibly by taking medicines. Your health care provider will help you make a plan to bring your blood pressure within a normal range. Eating and drinking   Eat a diet that is high in fiber and potassium, and low in salt (sodium), added sugar, and fat. An example eating plan is called the DASH (Dietary Approaches to Stop Hypertension) diet. To eat this way: ? Eat plenty of fresh fruits and vegetables. Try to fill half of your plate at each meal with fruits and vegetables. ? Eat whole grains, such as whole wheat pasta, brown rice, or whole grain bread. Fill about one quarter of your plate with whole grains. ? Eat low-fat diary products. ? Avoid fatty cuts of meat, processed or cured meats, and poultry with skin. Fill about one quarter of your plate with lean proteins such as fish, chicken without skin, beans, eggs, and tofu. ? Avoid premade and processed foods. These tend to be higher in sodium, added sugar, and fat.  Reduce your daily sodium intake. Most people with hypertension should eat less than 1,500 mg of sodium a day.  Limit alcohol intake to no more than 1 drink a day for nonpregnant women and 2 drinks a day for men. One drink equals 12 oz of beer, 5 oz of wine, or 1 oz of hard liquor. Lifestyle  Work with your health care provider to maintain a healthy body weight, or to lose weight. Ask what an ideal weight is for you.  Get at least 30 minutes of exercise that causes your heart to beat faster (aerobic exercise) most days of the week. Activities may include walking, swimming, or biking.  Include exercise   to strengthen your muscles (resistance exercise), such as weight lifting, as part of your weekly exercise routine. Try to do these types of exercises for 30 minutes at least 3 days a week.  Do not use any products that contain nicotine or tobacco, such as cigarettes and e-cigarettes. If you need help quitting,  ask your health care provider.  Control any long-term (chronic) conditions you have, such as high cholesterol or diabetes. Monitoring  Monitor your blood pressure at home as told by your health care provider. Your personal target blood pressure may vary depending on your medical conditions, your age, and other factors.  Have your blood pressure checked regularly, as often as told by your health care provider. Working with your health care provider  Review all the medicines you take with your health care provider because there may be side effects or interactions.  Talk with your health care provider about your diet, exercise habits, and other lifestyle factors that may be contributing to hypertension.  Visit your health care provider regularly. Your health care provider can help you create and adjust your plan for managing hypertension. Will I need medicine to control my blood pressure? Your health care provider may prescribe medicine if lifestyle changes are not enough to get your blood pressure under control, and if:  Your systolic blood pressure is 130 or higher.  Your diastolic blood pressure is 80 or higher. Take medicines only as told by your health care provider. Follow the directions carefully. Blood pressure medicines must be taken as prescribed. The medicine does not work as well when you skip doses. Skipping doses also puts you at risk for problems. Contact a health care provider if:  You think you are having a reaction to medicines you have taken.  You have repeated (recurrent) headaches.  You feel dizzy.  You have swelling in your ankles.  You have trouble with your vision. Get help right away if:  You develop a severe headache or confusion.  You have unusual weakness or numbness, or you feel faint.  You have severe pain in your chest or abdomen.  You vomit repeatedly.  You have trouble breathing. Summary  Hypertension is when the force of blood pumping  through your arteries is too strong. If this condition is not controlled, it may put you at risk for serious complications.  Your personal target blood pressure may vary depending on your medical conditions, your age, and other factors. For most people, a normal blood pressure is less than 120/80.  Hypertension is managed by lifestyle changes, medicines, or both. Lifestyle changes include weight loss, eating a healthy, low-sodium diet, exercising more, and limiting alcohol. This information is not intended to replace advice given to you by your health care provider. Make sure you discuss any questions you have with your health care provider. Document Revised: 03/11/2019 Document Reviewed: 10/15/2016 Elsevier Patient Education  [place]. Clonidine tablets What is this medicine? CLONIDINE (KLOE ni deen) is used to treat high blood pressure. This medicine may be used for other purposes; ask your health care provider or pharmacist if you have questions. COMMON BRAND NAME(S): Catapres What should I tell my health care provider before I take this medicine? They need to know if you have any of these conditions:  kidney disease  an unusual or allergic reaction to clonidine, other medicines, foods, dyes, or preservatives  pregnant or trying to get pregnant  breast-feeding How should I use this medicine? Take this medicine by mouth with a  glass of water. Follow the directions on the prescription label. Take your doses at regular intervals. Do not take your medicine more often than directed. Do not suddenly stop taking this medicine. You must gradually reduce the dose or you may get a dangerous increase in blood pressure. Ask your doctor or health care professional for advice. Talk to your pediatrician regarding the use of this medicine in children. Special care may be needed. Overdosage: If you think you have taken too much of this medicine contact a poison control center or emergency room  at once. NOTE: This medicine is only for you. Do not share this medicine with others. What if I miss a dose? If you miss a dose, take it as soon as you can. If it is almost time for your next dose, take only that dose. Do not take double or extra doses. What may interact with this medicine? Do not take this medicine with any of the following medications:  MAOIs like Carbex, Eldepryl, Marplan, Nardil, and Parnate This medicine may also interact with the following medications:  barbiturate medicines for inducing sleep or treating seizures like phenobarbital  certain medicines for blood pressure, heart disease, irregular heart beat  certain medicines for depression, anxiety, or psychotic disturbances  prescription pain medicines This list may not describe all possible interactions. Give your health care provider a list of all the medicines, herbs, non-prescription drugs, or dietary supplements you use. Also tell them if you smoke, drink alcohol, or use illegal drugs. Some items may interact with your medicine. What should I watch for while using this medicine? Visit your doctor or health care professional for regular checks on your progress. Check your heart rate and blood pressure regularly while you are taking this medicine. Ask your doctor or health care professional what your heart rate should be and when you should contact him or her. You may get drowsy or dizzy. Do not drive, use machinery, or do anything that needs mental alertness until you know how this medicine affects you. To avoid dizzy or fainting spells, do not stand or sit up quickly, especially if you are an older person. Alcohol can make you more drowsy and dizzy. Avoid alcoholic drinks. Your mouth may get dry. Chewing sugarless gum or sucking hard candy, and drinking plenty of water will help. Do not treat yourself for coughs, colds, or pain while you are taking this medicine without asking your doctor or health care professional  for advice. Some ingredients may increase your blood pressure. If you are going to have surgery tell your doctor or health care professional that you are taking this medicine. What side effects may I notice from receiving this medicine? Side effects that you should report to your doctor or health care professional as soon as possible:  allergic reactions like skin rash, itching or hives, swelling of the face, lips, or tongue  anxiety, nervousness  chest pain  depression  fast, irregular heartbeat  swelling of feet or legs  unusually weak or tired Side effects that usually do not require medical attention (report to your doctor or health care professional if they continue or are bothersome):  change in sex drive or performance  constipation  headache This list may not describe all possible side effects. Call your doctor for medical advice about side effects. You may report side effects to FDA at 1-800-FDA-1088. Where should I keep my medicine? Keep out of the reach of children. Store at room temperature between 15 and 30 degrees  C (59 and 86 degrees F). Protect from light. Keep container tightly closed. Throw away any unused medicine after the expiration date. NOTE: This sheet is a summary. It may not cover all possible information. If you have questions about this medicine, talk to your doctor, pharmacist, or health care provider.  2020 Elsevier/Gold Standard (2011-05-14 13:01:28)

## 2020-03-26 NOTE — Progress Notes (Signed)
Established Patient Office Visit  Subjective:  Patient ID: Larry Robertson, male    DOB: [birth year]  Age: 79 y.o. MRN: [id number]  CC:  Chief Complaint  Patient presents with  . Follow-up    3 month follow up on BP, pt not sure if meds are strong enough.     HPI Larry Robertson presents for follow-up of his hypertension.  Blood pressure was elevated at endocrinology's office and he was advised to follow-up with me a couple months ago.  BP has been running higher at home as well.  He assures compliance with his Azor with 10 mg of amlodipine and 40 mg of olmesartan.  He does have a history of well-controlled gout.  He had salt occasionally to certain foods such as tomatoes but does not regularly use it.  He has quit drinking he assures me.  He does not smoke or use illicit drugs.  Denies increased stress.  Past Medical History:  Diagnosis Date  . Arthritis   . Blood transfusion without reported diagnosis   . Colon polyps 05/19/2012  . 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 DIABETES MELLITUS, TYPE II 08/25/2007  . Diastolic dysfunction    on echo  . Diverticulosis   . ERECTILE DYSFUNCTION 08/25/2007  . 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 Heart murmur    but was not stated by DR Hilty  . Hepatitis    took treatment  . HEPATITIS C 08/25/2007  . HYPERTENSION 08/25/2007  . 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 HYPOGONADISM 09/27/2009    Past Surgical History:  Procedure Laterality Date  . ABDOMINAL HERNIA REPAIR  2000  . ANTERIOR CERVICAL DECOMP/DISCECTOMY FUSION Right 03/04/2018   Procedure: Cervical Three-Four, Cervical Six-Seven Right Side approach, Anterior cervical decompression/discectomy/fusion;  Surgeon: Erline Levine, MD;  Location: [facility];  Service: Neurosurgery;  Laterality: Right;  right side anterior approach  . CHOLECYSTECTOMY  1999  . COLONOSCOPY    . LUMBAR FUSION  2005  . TONSILLECTOMY AND ADENOIDECTOMY    . UPPER GASTROINTESTINAL ENDOSCOPY       Family History  Problem Relation Age of Onset  . Stroke Mother   . Diabetes Maternal Grandmother   . Cancer Neg Hx   . Colon cancer Neg Hx   . Esophageal cancer Neg Hx   . Rectal cancer Neg Hx   . Stomach cancer Neg Hx     Social History   Socioeconomic History  . Marital status: Single    Spouse name: Not on file  . Number of children: 7  . Years of education: Not on file  . Highest education level: Not on file  Occupational History  . 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 Smoking status: Never Smoker  . Smokeless tobacco: Never Used  Substance and Sexual Activity  . Alcohol use: Not Currently    Comment: Quit 3 months ago in July 2020.  Marland Kitchen Drug use: No  . Sexual activity: Not on file  Other Topics Concern  . Not on file  Social History Narrative   Divorced   Social Determinants of Health   Financial Resource Strain: Low Risk   . Difficulty of Paying Living Expenses: Not hard at all  Food Insecurity: No Food Insecurity  . Worried About Charity fundraiser in the Last Year: Never true  . Ran Out of Food in the Last Year: Never true  Transportation Needs: No Transportation Needs  .  Lack of Transportation (Medical): No  . Lack of Transportation (Non-Medical): No  Physical Activity:   . Days of Exercise per Week:   . Minutes of Exercise per Session:   Stress:   . Feeling of Stress :   Social Connections:   . Frequency of Communication with Friends and Family:   . Frequency of Social Gatherings with Friends and Family:   . Attends Religious Services:   . Active Member of Clubs or Organizations:   . Attends Archivist Meetings:   Marland Kitchen Marital Status:   Intimate Partner Violence:   . Fear of Current or Ex-Partner:   . Emotionally Abused:   Marland Kitchen Physically Abused:   . Sexually Abused:     Outpatient Medications Prior to Visit  Medication Sig Dispense Refill  . ACCU-CHEK AVIVA PLUS test strip USE AS INSTRUCTED ONCE  DAILY 50 strip 7  . allopurinol (ZYLOPRIM) 100 MG tablet Take one tablet every  day. (Patient taking differently: Take one tablet every other  day.) 30 tablet 3  . amLODipine-olmesartan (AZOR) 10-40 MG tablet TAKE 1 TABLET BY MOUTH EVERY DAY 90 tablet 1  . JANUVIA 100 MG tablet TAKE 1 TABLET BY MOUTH EVERY DAY 90 tablet 3  . metFORMIN (GLUCOPHAGE) 1000 MG tablet TAKE 1 TABLET BY MOUTH 2 TIMES DAILY WITH A MEAL. 180 tablet 1  . oxyCODONE-acetaminophen (PERCOCET) 10-325 MG tablet Take 1 tablet by mouth 3 (three) times daily as needed for pain. 90 tablet 0  . repaglinide (PRANDIN) 0.5 MG tablet Take 1 tablet (0.5 mg total) by mouth 2 (two) times daily before a meal. 180 tablet 3  . atorvastatin (LIPITOR) 20 MG tablet Take 1 tablet (20 mg total) by mouth at bedtime. 90 tablet 1  . colchicine 0.6 MG tablet 1 tablet every hour as needed for gout, not to exceed 6/day (Patient not taking: Reported on 02/29/2020) 6 tablet 4  . DULoxetine (CYMBALTA) 60 MG capsule TAKE 1 CAPSULE BY MOUTH EVERY DAY (Patient not taking: Reported on 03/26/2020) 90 capsule 2  . traZODone (DESYREL) 100 MG tablet Take 1 tablet by mouth at bedtime.     No facility-administered medications prior to visit.    Allergies  Allergen Reactions  . Lyrica [Pregabalin] Other (See Comments)    Blurred vision  . Gabapentin Rash and Other (See Comments)    Diplopia (double vision)    ROS Review of Systems  Constitutional: Negative.   Respiratory: Negative.   Cardiovascular: Negative.   Gastrointestinal: Negative.   Musculoskeletal: Positive for back pain.  Neurological: Negative for light-headedness and headaches.  Hematological: Does not bruise/bleed easily.  Psychiatric/Behavioral: Negative.       Objective:    Physical Exam  Constitutional: He is oriented to person, place, and time. He appears well-developed and well-nourished. No distress.  HENT:  Head: Normocephalic and atraumatic.  Right Ear: External ear normal.    Left Ear: External ear normal.  Eyes: Conjunctivae are normal. Right eye exhibits no discharge. Left eye exhibits no discharge. No scleral icterus.  Neck: No JVD present. No tracheal deviation present. No thyromegaly present.  Cardiovascular: Normal rate, regular rhythm and normal heart sounds.  Pulmonary/Chest: Effort normal and breath sounds normal. No stridor.  Musculoskeletal:        General: No edema.  Lymphadenopathy:    He has no cervical adenopathy.  Neurological: He is alert and oriented to person, place, and time.  Skin: Skin is warm and dry. He is not diaphoretic.  Psychiatric: He  has a normal mood and affect. His behavior is normal.    BP (!) 156/84   Pulse 74   Temp 98 F (36.7 C) (Tympanic)   Ht 6\' 1"  (1.854 m)   Wt 218 lb 12.8 oz (99.2 kg)   SpO2 94%   BMI 28.87 kg/m  Wt Readings from Last 3 Encounters:  03/26/20 218 lb 12.8 oz (99.2 kg)  02/29/20 220 lb (99.8 kg)  02/02/20 223 lb 3.2 oz (101.2 kg)     Health Maintenance Due  Topic Date Due  . FOOT EXAM  01/31/2020  . COVID-19 Vaccine (2 - Pfizer 2-dose series) 03/29/2020    There are no preventive care reminders to display for this patient.  Lab Results  Component Value Date   TSH 3.38 09/15/2019   Lab Results  Component Value Date   WBC 6.5 12/16/2019   HGB 14.1 12/16/2019   HCT 42.2 12/16/2019   MCV 90.8 12/16/2019   PLT 183.0 12/16/2019   Lab Results  Component Value Date   NA 138 12/16/2019   K 3.9 12/16/2019   CO2 25 12/16/2019   GLUCOSE 184 (H) 12/16/2019   BUN 12 12/16/2019   CREATININE 0.93 12/16/2019   BILITOT 0.7 12/16/2019   ALKPHOS 36 (L) 12/16/2019   AST 25 12/16/2019   ALT 32 12/16/2019   PROT 7.2 12/16/2019   ALBUMIN 4.9 12/16/2019   CALCIUM 9.8 12/16/2019   ANIONGAP 14 02/24/2018   GFR 78.23 12/16/2019   Lab Results  Component Value Date   CHOL 180 09/15/2019   Lab Results  Component Value Date   HDL 47.20 09/15/2019   Lab Results  Component Value Date    LDLCALC 39 01/21/2018   Lab Results  Component Value Date   TRIG (H) 09/15/2019    425.0 Triglyceride is over 400; calculations on Lipids are invalid.   Lab Results  Component Value Date   CHOLHDL 4 09/15/2019   Lab Results  Component Value Date   HGBA1C 6.1 (A) 02/02/2020      Assessment & Plan:   Problem List Items Addressed This Visit      Cardiovascular and Mediastinum   Essential hypertension - Primary   Relevant Medications   cloNIDine (CATAPRES) 0.1 MG tablet      Meds ordered this encounter  Medications  . cloNIDine (CATAPRES) 0.1 MG tablet    Sig: Take 1 tablet (0.1 mg total) by mouth 2 (two) times daily.    Dispense:  60 tablet    Refill:  1    Follow-up: Return in about 1 month (around 04/25/2020).  Patient was given information on managing blood pressure and also clonidine.  Follow-up in 1 month for recheck.   Libby Maw, MD

## 2020-04-02 ENCOUNTER — Ambulatory Visit: Payer: Medicare Other | Attending: Internal Medicine

## 2020-04-02 DIAGNOSIS — Z23 Encounter for immunization: Secondary | ICD-10-CM

## 2020-04-02 NOTE — Progress Notes (Signed)
   Covid-[facility] Vaccination [facility]  Name:  Larry Robertson    MRN: [id number] DOB: Oct 05, 1941  04/02/2020  [name] was observed post Covid-19 immunization for 15 minutes without incident. He was provided with Vaccine Information Sheet and instruction to access the V-Safe system.   [name] was instructed to call 911 with any severe reactions post vaccine: Marland Kitchen Difficulty breathing  . Swelling of face and throat  . A fast heartbeat  . A bad rash all over body  . Dizziness and weakness   Immunizations Administered    Name Date Dose VIS Date Route   Pfizer COVID-19 Vaccine 04/02/2020  9:10 AM 0.3 mL 01/25/2019 Intramuscular   Manufacturer: [address]   Lot: [id number]   [place]: [id number]

## 2020-04-19 ENCOUNTER — Other Ambulatory Visit: Payer: Self-pay | Admitting: Family Medicine

## 2020-04-19 DIAGNOSIS — I10 Essential (primary) hypertension: Secondary | ICD-10-CM

## 2020-04-24 ENCOUNTER — Other Ambulatory Visit: Payer: Self-pay

## 2020-04-25 ENCOUNTER — Encounter: Payer: Self-pay | Admitting: Family Medicine

## 2020-04-25 ENCOUNTER — Ambulatory Visit (INDEPENDENT_AMBULATORY_CARE_PROVIDER_SITE_OTHER): Payer: Medicare Other | Admitting: Family Medicine

## 2020-04-25 VITALS — BP 136/80 | HR 73 | Temp 97.3°F | Ht 73.0 in | Wt 217.2 lb

## 2020-04-25 DIAGNOSIS — I10 Essential (primary) hypertension: Secondary | ICD-10-CM

## 2020-04-25 DIAGNOSIS — M109 Gout, unspecified: Secondary | ICD-10-CM | POA: Diagnosis not present

## 2020-04-25 DIAGNOSIS — D649 Anemia, unspecified: Secondary | ICD-10-CM | POA: Diagnosis not present

## 2020-04-25 DIAGNOSIS — E538 Deficiency of other specified B group vitamins: Secondary | ICD-10-CM

## 2020-04-25 DIAGNOSIS — E781 Pure hyperglyceridemia: Secondary | ICD-10-CM | POA: Diagnosis not present

## 2020-04-25 LAB — COMPREHENSIVE METABOLIC PANEL
ALT: 25 U/L (ref 0–53)
AST: 16 U/L (ref 0–37)
Albumin: 4.9 g/dL (ref 3.5–5.2)
Alkaline Phosphatase: 39 U/L (ref 39–117)
BUN: 15 mg/dL (ref 6–23)
CO2: 24 mEq/L (ref 19–32)
Calcium: 10.2 mg/dL (ref 8.4–10.5)
Chloride: 103 mEq/L (ref 96–112)
Creatinine, Ser: 0.98 mg/dL (ref 0.40–1.50)
GFR: 73.58 mL/min (ref >60.00)
Glucose, Bld: 134 mg/dL — ABNORMAL HIGH (ref 70–99)
Potassium: 4.4 mEq/L (ref 3.5–5.1)
Sodium: 137 mEq/L (ref 135–145)
Total Bilirubin: 0.6 mg/dL (ref 0.2–1.2)
Total Protein: 7.2 g/dL (ref 6.0–8.3)

## 2020-04-25 LAB — LIPID PANEL
Cholesterol: 194 mg/dL (ref 0–200)
HDL: 38.9 mg/dL — ABNORMAL LOW (ref >39.00)
Total CHOL/HDL Ratio: 5
Triglycerides: 432 mg/dL — ABNORMAL HIGH (ref 0.0–149.0)

## 2020-04-25 LAB — URINALYSIS, ROUTINE W REFLEX MICROSCOPIC
Bilirubin Urine: NEGATIVE
Ketones, ur: NEGATIVE
Leukocytes,Ua: NEGATIVE
Nitrite: NEGATIVE
Specific Gravity, Urine: 1.015 (ref 1.000–1.030)
Total Protein, Urine: NEGATIVE
Urine Glucose: NEGATIVE
Urobilinogen, UA: 0.2 (ref 0.0–1.0)
pH: 5 (ref 5.0–8.0)

## 2020-04-25 LAB — URIC ACID: Uric Acid, Serum: 8.8 mg/dL — ABNORMAL HIGH (ref 4.0–7.8)

## 2020-04-25 LAB — CBC
HCT: 43.9 % (ref 39.0–52.0)
Hemoglobin: 15 g/dL (ref 13.0–17.0)
MCHC: 34.1 g/dL (ref 30.0–36.0)
MCV: 90.7 fl (ref 78.0–100.0)
Platelets: 185 10*3/uL (ref 150.0–400.0)
RBC: 4.84 Mil/uL (ref 4.22–5.81)
RDW: 13.4 % (ref 11.5–15.5)
WBC: 6.9 10*3/uL (ref 4.0–10.5)

## 2020-04-25 LAB — LDL CHOLESTEROL, DIRECT: Direct LDL: 85 mg/dL

## 2020-04-25 LAB — VITAMIN B12: Vitamin B-12: 354 pg/mL (ref 211–911)

## 2020-04-25 MED ORDER — ALLOPURINOL 100 MG PO TABS: ORAL_TABLET | ORAL | 3 refills | Status: DC

## 2020-04-25 MED ORDER — B COMPLEX VITAMINS PO CAPS: 1.0000 | ORAL_CAPSULE | Freq: Every day | ORAL | 1 refills | Status: DC

## 2020-04-25 MED ORDER — FISH OIL 1000 MG PO CAPS: 2.0000 | ORAL_CAPSULE | Freq: Every day | ORAL | 1 refills | Status: DC

## 2020-04-25 NOTE — Progress Notes (Addendum)
Established Patient Office Visit  Subjective:  Patient ID: Larry Robertson, male    DOB: July 24, 1941  Age: 79 y.o. MRN: [id number]  CC:  Chief Complaint  Patient presents with  . Follow-up    1 month follow up on BP, no concerns.     HPI Larry Robertson presents for follow-up of his hypertension, gouty arthritis, anemia, and hyperlipidemia.  Remains active at home working around his yard and in his gym.  Denies alcohol usage.  Continues follow-up with pain management and endocrinology.  Has been taking allopurinol every other day.  Uses colchicine as needed.  Continues atorvastatin.  He is fasting today he says.  Past Medical History:  Diagnosis Date  . Arthritis   . Blood transfusion without reported diagnosis   . Colon polyps 05/19/2012  . 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 DIABETES MELLITUS, TYPE II 08/25/2007  . Diastolic dysfunction    on echo  . Diverticulosis   . ERECTILE DYSFUNCTION 08/25/2007  . 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 Heart murmur    but was not stated by DR Hilty  . Hepatitis    took treatment  . HEPATITIS C 08/25/2007  . HYPERTENSION 08/25/2007  . 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 HYPOGONADISM 09/27/2009    Past Surgical History:  Procedure Laterality Date  . ABDOMINAL HERNIA REPAIR  2000  . ANTERIOR CERVICAL DECOMP/DISCECTOMY FUSION Right 03/04/2018   Procedure: Cervical Three-Four, Cervical Six-Seven Right Side approach, Anterior cervical decompression/discectomy/fusion;  Surgeon: Erline Levine, MD;  Location: [facility];  Service: Neurosurgery;  Laterality: Right;  right side anterior approach  . CHOLECYSTECTOMY  1999  . COLONOSCOPY    . LUMBAR FUSION  2005  . TONSILLECTOMY AND ADENOIDECTOMY    . UPPER GASTROINTESTINAL ENDOSCOPY      Family History  Problem Relation Age of Onset  . Stroke Mother   . Diabetes Maternal Grandmother   . Cancer Neg Hx   . Colon cancer Neg Hx   . Esophageal cancer Neg Hx   . Rectal cancer  Neg Hx   . Stomach cancer Neg Hx     Social History   Socioeconomic History  . Marital status: Single    Spouse name: Not on file  . Number of children: 7  . Years of education: Not on file  . Highest education level: Not on file  Occupational History  . 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 Smoking status: Never Smoker  . Smokeless tobacco: Never Used  Substance and Sexual Activity  . Alcohol use: Not Currently    Comment: Quit 3 months ago in July 2020.  Marland Kitchen Drug use: No  . Sexual activity: Not on file  Other Topics Concern  . Not on file  Social History Narrative   Divorced   Social Determinants of Health   Financial Resource Strain: Low Risk   . Difficulty of Paying Living Expenses: Not hard at all  Food Insecurity: No Food Insecurity  . Worried About Charity fundraiser in the Last Year: Never true  . Ran Out of Food in the Last Year: Never true  Transportation Needs: No Transportation Needs  . Lack of Transportation (Medical): No  . Lack of Transportation (Non-Medical): No  Physical Activity:   . Days of Exercise per Week:   . Minutes of Exercise per Session:   Stress:   . Feeling of Stress :   Social Connections:   .  Frequency of Communication with Friends and Family:   . Frequency of Social Gatherings with Friends and Family:   . Attends Religious Services:   . Active Member of Clubs or Organizations:   . Attends Archivist Meetings:   Marland Kitchen Marital Status:   Intimate Partner Violence:   . Fear of Current or Ex-Partner:   . Emotionally Abused:   Marland Kitchen Physically Abused:   . Sexually Abused:     Outpatient Medications Prior to Visit  Medication Sig Dispense Refill  . ACCU-CHEK AVIVA PLUS test strip USE AS INSTRUCTED ONCE DAILY 50 strip 7  . amLODipine-olmesartan (AZOR) 10-40 MG tablet TAKE 1 TABLET BY MOUTH EVERY DAY 90 tablet 1  . cloNIDine (CATAPRES) 0.1 MG tablet TAKE 1 TABLET (0.1 MG TOTAL) BY MOUTH 2 (TWO)  TIMES DAILY. 60 tablet 0  . JANUVIA 100 MG tablet TAKE 1 TABLET BY MOUTH EVERY DAY 90 tablet 3  . metFORMIN (GLUCOPHAGE) 1000 MG tablet TAKE 1 TABLET BY MOUTH 2 TIMES DAILY WITH A MEAL. 180 tablet 1  . oxyCODONE-acetaminophen (PERCOCET) 10-325 MG tablet Take 1 tablet by mouth 3 (three) times daily as needed for pain. 90 tablet 0  . repaglinide (PRANDIN) 0.5 MG tablet Take 1 tablet (0.5 mg total) by mouth 2 (two) times daily before a meal. 180 tablet 3  . allopurinol (ZYLOPRIM) 100 MG tablet Take one tablet every  day. (Patient taking differently: Take one tablet every other  day.) 30 tablet 3  . atorvastatin (LIPITOR) 20 MG tablet Take 1 tablet (20 mg total) by mouth at bedtime. 90 tablet 1  . colchicine 0.6 MG tablet 1 tablet every hour as needed for gout, not to exceed 6/day (Patient not taking: Reported on 02/29/2020) 6 tablet 4  . traZODone (DESYREL) 100 MG tablet Take 1 tablet by mouth at bedtime.    . DULoxetine (CYMBALTA) 60 MG capsule TAKE 1 CAPSULE BY MOUTH EVERY DAY (Patient not taking: Reported on 03/26/2020) 90 capsule 2   No facility-administered medications prior to visit.    Allergies  Allergen Reactions  . Lyrica [Pregabalin] Other (See Comments)    Blurred vision  . Gabapentin Rash and Other (See Comments)    Diplopia (double vision)    ROS Review of Systems  Constitutional: Negative.   HENT: Negative.   Eyes: Negative for photophobia and visual disturbance.  Respiratory: Negative.   Cardiovascular: Negative.   Gastrointestinal: Negative.   Endocrine: Negative for polyphagia and polyuria.  Genitourinary: Negative for difficulty urinating, frequency and urgency.  Musculoskeletal: Positive for arthralgias and back pain.  Skin: Negative for pallor and rash.  Allergic/Immunologic: Negative for immunocompromised state.  Neurological: Negative for tremors and speech difficulty.  Hematological: Does not bruise/bleed easily.  Psychiatric/Behavioral: Negative.   All other  systems reviewed and are negative.     Objective:    Physical Exam  Constitutional: He is oriented to person, place, and time. He appears well-developed and well-nourished. No distress.  HENT:  Head: Normocephalic and atraumatic.  Right Ear: External ear normal.  Left Ear: External ear normal.  Eyes: Conjunctivae are normal. Right eye exhibits no discharge. Left eye exhibits no discharge. No scleral icterus.  Neck: No JVD present. No tracheal deviation present. No thyromegaly present.  Cardiovascular: Normal rate, regular rhythm and normal heart sounds.  Pulmonary/Chest: Effort normal and breath sounds normal. No stridor.  Abdominal: Bowel sounds are normal.  Lymphadenopathy:    He has no cervical adenopathy.  Neurological: He is alert and oriented to  person, place, and time.  Skin: Skin is warm and dry. He is not diaphoretic.  Psychiatric: He has a normal mood and affect. His behavior is normal.    BP 136/80   Pulse 73   Temp (!) 97.3 F (36.3 C) (Tympanic)   Ht 6\' 1"  (1.854 m)   Wt 217 lb 3.2 oz (98.5 kg)   SpO2 95%   BMI 28.66 kg/m  Wt Readings from Last 3 Encounters:  04/25/20 217 lb 3.2 oz (98.5 kg)  03/26/20 218 lb 12.8 oz (99.2 kg)  02/29/20 220 lb (99.8 kg)     Health Maintenance Due  Topic Date Due  . FOOT EXAM  01/31/2020    There are no preventive care reminders to display for this patient.  Lab Results  Component Value Date   TSH 3.38 09/15/2019   Lab Results  Component Value Date   WBC 6.9 04/25/2020   HGB 15.0 04/25/2020   HCT 43.9 04/25/2020   MCV 90.7 04/25/2020   PLT 185.0 04/25/2020   Lab Results  Component Value Date   NA 137 04/25/2020   K 4.4 04/25/2020   CO2 24 04/25/2020   GLUCOSE 134 (H) 04/25/2020   BUN 15 04/25/2020   CREATININE 0.98 04/25/2020   BILITOT 0.6 04/25/2020   ALKPHOS 39 04/25/2020   AST 16 04/25/2020   ALT 25 04/25/2020   PROT 7.2 04/25/2020   ALBUMIN 4.9 04/25/2020   CALCIUM 10.2 04/25/2020   ANIONGAP 14  02/24/2018   GFR 73.58 04/25/2020   Lab Results  Component Value Date   CHOL 194 04/25/2020   Lab Results  Component Value Date   HDL 38.90 (L) 04/25/2020   Lab Results  Component Value Date   LDLCALC 39 01/21/2018   Lab Results  Component Value Date   TRIG (H) 04/25/2020    432.0 Triglyceride is over 400; calculations on Lipids are invalid.   Lab Results  Component Value Date   CHOLHDL 5 04/25/2020   Lab Results  Component Value Date   HGBA1C 6.1 (A) 02/02/2020      Assessment & Plan:   Problem List Items Addressed This Visit      Cardiovascular and Mediastinum   Essential hypertension - Primary   Relevant Orders   Urinalysis, Routine w reflex microscopic (Completed)   Comprehensive metabolic panel (Completed)     Musculoskeletal and Integument   Gouty arthropathy   Relevant Medications   allopurinol (ZYLOPRIM) 100 MG tablet   Other Relevant Orders   Uric acid (Completed)   Comprehensive metabolic panel (Completed)     Other   HYPERTRIGLYCERIDEMIA   Relevant Medications   b complex vitamins capsule   Omega-3 Fatty Acids (FISH OIL) 1000 MG CAPS   Other Relevant Orders   LDL cholesterol, direct (Completed)   Lipid panel (Completed)   Anemia   Relevant Orders   CBC (Completed)   B12 deficiency   Relevant Medications   b complex vitamins capsule   Other Relevant Orders   Vitamin B12 (Completed)      Meds ordered this encounter  Medications  . b complex vitamins capsule    Sig: Take 1 capsule by mouth daily.    Dispense:  90 capsule    Refill:  1  . Omega-3 Fatty Acids (FISH OIL) 1000 MG CAPS    Sig: Take 2 capsules (2,000 mg total) by mouth daily.    Dispense:  180 capsule    Refill:  1  . allopurinol (ZYLOPRIM) 100  MG tablet    Sig: Take one tablet every  day.    Dispense:  90 tablet    Refill:  3    Follow-up: Return in about 6 months (around 10/26/2020).   Praised is continue abstinence from alcohol.  Labs are pending.  Of note he  is taking his allopurinol every other day. Libby Maw, MD

## 2020-04-25 NOTE — Addendum Note (Signed)
Addended by: Jon Billings on: 04/25/2020 05:02 PM   Modules accepted: Orders

## 2020-05-03 ENCOUNTER — Other Ambulatory Visit: Payer: Self-pay | Admitting: Family Medicine

## 2020-05-03 DIAGNOSIS — I10 Essential (primary) hypertension: Secondary | ICD-10-CM

## 2020-05-13 ENCOUNTER — Other Ambulatory Visit: Payer: Self-pay | Admitting: Family Medicine

## 2020-05-13 DIAGNOSIS — I10 Essential (primary) hypertension: Secondary | ICD-10-CM

## 2020-06-06 ENCOUNTER — Other Ambulatory Visit: Payer: Self-pay

## 2020-06-06 ENCOUNTER — Ambulatory Visit (INDEPENDENT_AMBULATORY_CARE_PROVIDER_SITE_OTHER): Payer: Medicare Other | Admitting: Endocrinology

## 2020-06-06 ENCOUNTER — Encounter: Payer: Self-pay | Admitting: Endocrinology

## 2020-06-06 VITALS — BP 128/74 | HR 78 | Ht 73.0 in | Wt 220.0 lb

## 2020-06-06 DIAGNOSIS — E119 Type 2 diabetes mellitus without complications: Secondary | ICD-10-CM | POA: Diagnosis not present

## 2020-06-06 LAB — POCT GLYCOSYLATED HEMOGLOBIN (HGB A1C): Hemoglobin A1C: 6.7 % — AB (ref 4.0–5.6)

## 2020-06-06 NOTE — Patient Instructions (Addendum)
Please continue the same diabetes medications.  check your blood sugar once a day.  vary the time of day when you check, between before the 3 meals, and at bedtime.  also check if you have symptoms of your blood sugar being too high or too low.  please keep a record of the readings and bring it to your next appointment here (or you can bring the meter itself).  You can write it on any piece of paper.  please call us sooner if your blood sugar goes below 70, or if you have a lot of readings over 200. Please come back for a follow-up appointment in 6 months.   

## 2020-06-06 NOTE — Progress Notes (Signed)
Subjective:    Patient ID: Larry Robertson, male    DOB: 1941-09-19, 79 y.o.   MRN: [id number]  HPI Pt returns for f/u of diabetes mellitus:  DM type: 2 Dx'ed: 2010.  Complications: renal insuff Therapy: 3 oral meds.  DKA: never.  Severe hypoglycemia: never.  Pancreatitis: never.  Other: edema and nocturia (he no longer takes d-DAVP), limit oral rx options; he has never taken insulin.  Interval history: no cbg record, but states cbg's are well-controlled.  he takes meds as rx'ed.  pt states he feels well in general.   Past Medical History:  Diagnosis Date  . Arthritis   . Blood transfusion without reported diagnosis   . Colon polyps 05/19/2012  . 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 DIABETES MELLITUS, TYPE II 08/25/2007  . Diastolic dysfunction    on echo  . Diverticulosis   . ERECTILE DYSFUNCTION 08/25/2007  . 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 Heart murmur    but was not stated by DR Hilty  . Hepatitis    took treatment  . HEPATITIS C 08/25/2007  . HYPERTENSION 08/25/2007  . 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 HYPOGONADISM 09/27/2009    Past Surgical History:  Procedure Laterality Date  . ABDOMINAL HERNIA REPAIR  2000  . ANTERIOR CERVICAL DECOMP/DISCECTOMY FUSION Right 03/04/2018   Procedure: Cervical Three-Four, Cervical Six-Seven Right Side approach, Anterior cervical decompression/discectomy/fusion;  Surgeon: Erline Levine, MD;  Location: [facility];  Service: Neurosurgery;  Laterality: Right;  right side anterior approach  . CHOLECYSTECTOMY  1999  . COLONOSCOPY    . LUMBAR FUSION  2005  . TONSILLECTOMY AND ADENOIDECTOMY    . UPPER GASTROINTESTINAL ENDOSCOPY      Social History   Socioeconomic History  . Marital status: Single    Spouse name: Not on file  . Number of children: 7  . Years of education: Not on file  . Highest education level: Not on file  Occupational History  . 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 Smoking status: Never Smoker  . Smokeless tobacco: Never Used  Substance and Sexual Activity  . Alcohol use: Not Currently    Comment: Quit 3 months ago in July 2020.  Marland Kitchen Drug use: No  . Sexual activity: Not on file  Other Topics Concern  . Not on file  Social History Narrative   Divorced   Social Determinants of Health   Financial Resource Strain: Low Risk   . Difficulty of Paying Living Expenses: Not hard at all  Food Insecurity: No Food Insecurity  . Worried About Charity fundraiser in the Last Year: Never true  . Ran Out of Food in the Last Year: Never true  Transportation Needs: No Transportation Needs  . Lack of Transportation (Medical): No  . Lack of Transportation (Non-Medical): No  Physical Activity:   . Days of Exercise per Week:   . Minutes of Exercise per Session:   Stress:   . Feeling of Stress :   Social Connections:   . Frequency of Communication with Friends and Family:   . Frequency of Social Gatherings with Friends and Family:   . Attends Religious Services:   . Active Member of Clubs or Organizations:   . Attends Archivist Meetings:   Marland Kitchen Marital Status:   Intimate Partner Violence:   . Fear of Current or Ex-Partner:   . Emotionally Abused:   Marland Kitchen Physically Abused:   .  Sexually Abused:     Current Outpatient Medications on File Prior to Visit  Medication Sig Dispense Refill  . ACCU-CHEK AVIVA PLUS test strip USE AS INSTRUCTED ONCE DAILY 50 strip 7  . allopurinol (ZYLOPRIM) 100 MG tablet Take one tablet every  day. 90 tablet 3  . amLODipine-olmesartan (AZOR) 10-40 MG tablet TAKE 1 TABLET BY MOUTH EVERY DAY 90 tablet 1  . b complex vitamins capsule Take 1 capsule by mouth daily. 90 capsule 1  . cloNIDine (CATAPRES) 0.1 MG tablet TAKE 1 TABLET (0.1 MG TOTAL) BY MOUTH 2 (TWO) TIMES DAILY. 180 tablet 3  . colchicine 0.6 MG tablet 1 tablet every hour as needed for gout, not to exceed 6/day 6 tablet 4  . JANUVIA 100 MG tablet TAKE  1 TABLET BY MOUTH EVERY DAY 90 tablet 3  . metFORMIN (GLUCOPHAGE) 1000 MG tablet TAKE 1 TABLET BY MOUTH 2 TIMES DAILY WITH A MEAL. 180 tablet 1  . Omega-3 Fatty Acids (FISH OIL) 1000 MG CAPS Take 2 capsules (2,000 mg total) by mouth daily. 180 capsule 1  . oxyCODONE-acetaminophen (PERCOCET) 10-325 MG tablet Take 1 tablet by mouth 3 (three) times daily as needed for pain. 90 tablet 0  . repaglinide (PRANDIN) 0.5 MG tablet Take 1 tablet (0.5 mg total) by mouth 2 (two) times daily before a meal. 180 tablet 3  . traZODone (DESYREL) 100 MG tablet Take 1 tablet by mouth at bedtime.    Marland Kitchen atorvastatin (LIPITOR) 20 MG tablet Take 1 tablet (20 mg total) by mouth at bedtime. 90 tablet 1   No current facility-administered medications on file prior to visit.    Allergies  Allergen Reactions  . Lyrica [Pregabalin] Other (See Comments)    Blurred vision  . Gabapentin Rash and Other (See Comments)    Diplopia (double vision)    Family History  Problem Relation Age of Onset  . Stroke Mother   . Diabetes Maternal Grandmother   . Cancer Neg Hx   . Colon cancer Neg Hx   . Esophageal cancer Neg Hx   . Rectal cancer Neg Hx   . Stomach cancer Neg Hx     BP 128/74   Pulse 78   Ht 6\' 1"  (1.854 m)   Wt 220 lb (99.8 kg)   SpO2 97%   BMI 29.03 kg/m    Review of Systems He denies hypoglycemia    Objective:   Physical Exam VITAL SIGNS:  See vs page GENERAL: no distress Pulses: dorsalis pedis intact bilat.   MSK: no deformity of the feet CV: no leg edema Skin:  no ulcer on the feet.  normal color and temp on the feet. Neuro: sensation is intact to touch on the feet   Lab Results  Component Value Date   HGBA1C 6.7 (A) 06/06/2020   Lab Results  Component Value Date   CREATININE 0.98 04/25/2020   BUN 15 04/25/2020   NA 137 04/25/2020   K 4.4 04/25/2020   CL 103 04/25/2020   CO2 24 04/25/2020       Assessment & Plan:  Type 2 DM: well-controlled.  we discussed changing  Januvia+repaglinide to Rybelsus.  He chooses to continue the same for now.  Patient Instructions  Please continue the same diabetes medications.  check your blood sugar once a day.  vary the time of day when you check, between before the 3 meals, and at bedtime.  also check if you have symptoms of your blood sugar being too high  or too low.  please keep a record of the readings and bring it to your next appointment here (or you can bring the meter itself).  You can write it on any piece of paper.  please call us sooner if your blood sugar goes below 70, or if you have a lot of readings over 200. Please come back for a follow-up appointment in 6 months.

## 2020-06-16 ENCOUNTER — Emergency Department (HOSPITAL_COMMUNITY)
Admission: EM | Admit: 2020-06-16 | Discharge: 2020-06-16 | Disposition: A | Discharge status: Home / Self Care | Payer: Medicare Other | Attending: Emergency Medicine | Admitting: Emergency Medicine

## 2020-06-16 ENCOUNTER — Emergency Department (HOSPITAL_COMMUNITY): Payer: Medicare Other

## 2020-06-16 ENCOUNTER — Encounter (HOSPITAL_COMMUNITY): Payer: Self-pay | Admitting: Emergency Medicine

## 2020-06-16 ENCOUNTER — Other Ambulatory Visit: Payer: Self-pay

## 2020-06-16 DIAGNOSIS — Z7984 Long term (current) use of oral hypoglycemic drugs: Secondary | ICD-10-CM | POA: Insufficient documentation

## 2020-06-16 DIAGNOSIS — I129 Hypertensive chronic kidney disease with stage 1 through stage 4 chronic kidney disease, or unspecified chronic kidney disease: Secondary | ICD-10-CM | POA: Insufficient documentation

## 2020-06-16 DIAGNOSIS — M5441 Lumbago with sciatica, right side: Secondary | ICD-10-CM | POA: Diagnosis not present

## 2020-06-16 DIAGNOSIS — M545 Low back pain: Secondary | ICD-10-CM | POA: Diagnosis present

## 2020-06-16 DIAGNOSIS — N1832 Chronic kidney disease, stage 3b: Secondary | ICD-10-CM | POA: Insufficient documentation

## 2020-06-16 DIAGNOSIS — Z79899 Other long term (current) drug therapy: Secondary | ICD-10-CM | POA: Diagnosis not present

## 2020-06-16 DIAGNOSIS — E119 Type 2 diabetes mellitus without complications: Secondary | ICD-10-CM | POA: Insufficient documentation

## 2020-06-16 LAB — URINALYSIS, ROUTINE W REFLEX MICROSCOPIC
Bilirubin Urine: NEGATIVE
Glucose, UA: NEGATIVE mg/dL
Hgb urine dipstick: NEGATIVE
Ketones, ur: NEGATIVE mg/dL
Leukocytes,Ua: NEGATIVE
Nitrite: NEGATIVE
Protein, ur: NEGATIVE mg/dL
Specific Gravity, Urine: 1.011 (ref 1.005–1.030)
pH: 5 (ref 5.0–8.0)

## 2020-06-16 MED ORDER — IBUPROFEN 800 MG PO TABS
800.0000 mg | ORAL_TABLET | Freq: Once | ORAL | Status: AC
  Administered 2020-06-16: 800 mg via ORAL
  Filled 2020-06-16: qty 1

## 2020-06-16 MED ORDER — CYCLOBENZAPRINE HCL 10 MG PO TABS
10.0000 mg | ORAL_TABLET | Freq: Two times a day (BID) | ORAL | 0 refills | Status: DC | PRN
Start: 2020-06-16 — End: 2021-09-17

## 2020-06-16 MED ORDER — PREDNISONE 10 MG PO TABS
20.0000 mg | ORAL_TABLET | Freq: Every day | ORAL | 0 refills | Status: DC
Start: 2020-06-16 — End: 2020-10-23

## 2020-06-16 NOTE — ED Triage Notes (Signed)
Pt. Stated, Larry Robertson had this rt back pain right above the hip since over a week ago.

## 2020-06-16 NOTE — Discharge Instructions (Addendum)
Please use hot and cold therapy with medications.  Return if any weakness, loss of bowel or bladder control.  Recheck with your doctor this week.

## 2020-06-16 NOTE — ED Notes (Signed)
Discharge instructions reviewed with pt. Pt verbalized understanding.   

## 2020-06-16 NOTE — ED Provider Notes (Signed)
[facility] EMERGENCY DEPARTMENT Provider Note   CSN: [id number] Arrival date & time: 06/16/20  1327     History Chief Complaint  Patient presents with  . Flank Pain    Larry Robertson is a 79 y.o. male.  HPI      79 year old male presents today complaining of right low back pain for about 1 week.  He states it began after he was at the gym there was no definitive time when it began.  Is been present for approximately a week.  He describes it as sharp like a toothache.  He cannot tell me anything that makes it better or worse.  The only thing he has taken for was an oxycodone last night which she did not feel helped.  There is no radiation down the leg.  Denies any prior similar symptoms.  He denies any history of cancer, blood clots, smoking.  He denies any numbness, tingling, weakness, loss of bowel or bladder control.  Past Medical History:  Diagnosis Date  . Arthritis   . Blood transfusion without reported diagnosis   . Colon polyps 05/19/2012  . 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 DIABETES MELLITUS, TYPE II 08/25/2007  . Diastolic dysfunction    on echo  . Diverticulosis   . ERECTILE DYSFUNCTION 08/25/2007  . 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 Heart murmur    but was not stated by DR Hilty  . Hepatitis    took treatment  . HEPATITIS C 08/25/2007  . HYPERTENSION 08/25/2007  . 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 HYPOGONADISM 09/27/2009    Patient Active Problem List   Diagnosis Date Noted  . CKD (chronic kidney disease) stage 2, GFR 60-89 ml/min 12/16/2019  . B12 deficiency 12/16/2019  . Iron overload 12/16/2019  . Stage 3b chronic kidney disease 10/18/2019  . Macrocytosis 10/18/2019  . Anemia 09/19/2019  . Need for influenza vaccination 09/19/2019  . Breast mass 09/15/2019  . Breast tenderness in male 09/15/2019  . 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 OA (osteoarthritis) of knee 06/17/2019  . History  of hepatitis C 06/14/2019  . Elevated cholesterol 06/14/2019  . 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 Labral tear of shoulder, degenerative, left 08/23/2018  . Contracture of joint of left shoulder region 08/23/2018  . 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/23/2018  . Chronic pain syndrome 08/23/2018  . Herniated cervical intervertebral disc 03/04/2018  . Narcotic drug use 11/23/2017  . Dyslipidemia 10/01/2017  . 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/01/2017  . Atrial fibrillation ([place]) 06/08/2017  . Thrombocytopenia ([place]) 10/15/2016  . 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 Nocturia 08/09/2014  . Hematuria 08/09/2014  . Other dysphagia 05/18/2014  . Personal history of colonic polyps 07/13/2013  . Nonspecific abnormal electrocardiogram (ECG) (EKG) 04/20/2013  . Encounter for long-term (current) use of other medications 04/15/2012  . Healthcare maintenance 04/15/2012  . Radiculopathy of leg 10/08/2011  . Blurry vision 10/08/2011  . Special screening, prostate cancer 04/09/2011  . Lumbago 04/09/2011  . OTHER SPEC HYPERTROPHIC&ATROPHIC CONDITION SKIN 02/06/2010  . FOOT PAIN, LEFT 02/06/2010  . HYPOGONADISM 09/27/2009  . 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 FLANK PAIN, RIGHT 07/17/2009  . 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  . Acute hepatitis C virus infection 08/25/2007  . Diabetes ([place]) 08/25/2007  . 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  . ERECTILE DYSFUNCTION 08/25/2007  . Essential hypertension 08/25/2007    Past Surgical History:  Procedure Laterality Date  . ABDOMINAL HERNIA REPAIR  2000  .  ANTERIOR CERVICAL DECOMP/DISCECTOMY FUSION Right 03/04/2018   Procedure: Cervical Three-Four, Cervical Six-Seven Right Side approach, Anterior cervical decompression/discectomy/fusion;  Surgeon: Erline Levine, MD;  Location: [facility];  Service: Neurosurgery;  Laterality: Right;  right side anterior approach  . CHOLECYSTECTOMY  1999  . COLONOSCOPY    . LUMBAR FUSION  2005  . TONSILLECTOMY  AND ADENOIDECTOMY    . UPPER GASTROINTESTINAL ENDOSCOPY         Family History  Problem Relation Age of Onset  . Stroke Mother   . Diabetes Maternal Grandmother   . Cancer Neg Hx   . Colon cancer Neg Hx   . Esophageal cancer Neg Hx   . Rectal cancer Neg Hx   . Stomach cancer Neg Hx     Social History   Tobacco Use  . Smoking status: Never Smoker  . Smokeless tobacco: Never Used  Substance Use Topics  . Alcohol use: Not Currently    Comment: Quit 3 months ago in July 2020.  Marland Kitchen Drug use: No    Home Medications Prior to Admission medications   Medication Sig Start Date End Date Taking? Authorizing Provider  ACCU-CHEK AVIVA PLUS test strip USE AS INSTRUCTED ONCE DAILY 08/25/19   Renato Shin, MD  allopurinol (ZYLOPRIM) 100 MG tablet Take one tablet every  day. 04/25/20   Libby Maw, MD  amLODipine-olmesartan (AZOR) 10-40 MG tablet TAKE 1 TABLET BY MOUTH EVERY DAY 05/03/20   Libby Maw, MD  atorvastatin (LIPITOR) 20 MG tablet Take 1 tablet (20 mg total) by mouth at bedtime. 11/05/18 02/09/20  Shawnee Knapp, MD  b complex vitamins capsule Take 1 capsule by mouth daily. 04/25/20   Libby Maw, MD  cloNIDine (CATAPRES) 0.1 MG tablet TAKE 1 TABLET (0.1 MG TOTAL) BY MOUTH 2 (TWO) TIMES DAILY. 05/14/20   Libby Maw, MD  colchicine 0.6 MG tablet 1 tablet every hour as needed for gout, not to exceed 6/day 09/01/18   Renato Shin, MD  JANUVIA 100 MG tablet TAKE 1 TABLET BY MOUTH EVERY DAY 08/04/19   Renato Shin, MD  metFORMIN (GLUCOPHAGE) 1000 MG tablet TAKE 1 TABLET BY MOUTH 2 TIMES DAILY WITH A MEAL. 03/07/20   Renato Shin, MD  Omega-3 Fatty Acids (FISH OIL) 1000 MG CAPS Take 2 capsules (2,000 mg total) by mouth daily. 04/25/20   Libby Maw, MD  oxyCODONE-acetaminophen (PERCOCET) 10-325 MG tablet Take 1 tablet by mouth 3 (three) times daily as needed for pain. 10/25/19   Bayard Hugger, NP  repaglinide (PRANDIN) 0.5 MG tablet Take 1  tablet (0.5 mg total) by mouth 2 (two) times daily before a meal. 02/02/20   Renato Shin, MD  traZODone (DESYREL) 100 MG tablet Take 1 tablet by mouth at bedtime. 01/25/19   [provider]    Allergies    Lyrica [pregabalin] and Gabapentin  Review of Systems   Review of Systems  All other systems reviewed and are negative.   Physical Exam Updated Vital Signs BP (!) 163/79 (BP Location: Right Arm)   Pulse 82   Temp 98.8 F (37.1 C) (Oral)   Resp 16   Ht 1.854 m (6\' 1" )   Wt 99.8 kg   SpO2 94%   BMI 29.03 kg/m   Physical Exam Vitals and nursing note reviewed.  Constitutional:      Appearance: He is well-developed.  HENT:     Head: Normocephalic and atraumatic.     Right Ear: External ear normal.  Left Ear: External ear normal.     Nose: Nose normal.  Eyes:     Conjunctiva/sclera: Conjunctivae normal.     Pupils: Pupils are equal, round, and reactive to light.  Cardiovascular:     Rate and Rhythm: Normal rate and regular rhythm.     Heart sounds: Normal heart sounds.  Pulmonary:     Effort: Pulmonary effort is normal. No respiratory distress.     Breath sounds: Normal breath sounds. No wheezing.  Chest:     Chest wall: No tenderness.  Abdominal:     General: Bowel sounds are normal. There is no distension.     Palpations: Abdomen is soft. There is no mass.     Tenderness: There is no abdominal tenderness. There is no guarding.  Musculoskeletal:        General: Normal range of motion.     Cervical back: Normal range of motion and neck supple.     Comments: No point tenderness over thoracic or lumbar spine There is some tenderness over the posterior iliac There is no hip tenderness or decreased range of motion over  Skin:    General: Skin is warm and dry.  Neurological:     Mental Status: He is alert and oriented to person, place, and time.     Motor: No abnormal muscle tone.     Coordination: Coordination normal.     Deep Tendon Reflexes: Reflexes  are normal and symmetric.  Psychiatric:        Behavior: Behavior normal.        Thought Content: Thought content normal.        Judgment: Judgment normal.     ED Results / Procedures / Treatments   Labs (all labs ordered are listed, but only abnormal results are displayed) Labs Reviewed  URINALYSIS, ROUTINE W REFLEX MICROSCOPIC - Abnormal; Notable for the following components:      Result Value   Color, Urine AMBER (*)    APPearance CLOUDY (*)    All other components within normal limits    EKG None  Radiology CT Lumbar Spine Wo Contrast  Result Date: 06/16/2020 CLINICAL DATA:  Low back pain for 1 week right hip pain EXAM: CT LUMBAR SPINE WITHOUT CONTRAST TECHNIQUE: Multidetector CT imaging of the lumbar spine was performed without intravenous contrast administration. Multiplanar CT image reconstructions were also generated. 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 Lowest disc space will be denoted as L5-S1 in order to keep with most recent prior numbering systems. First non rib-bearing level is denoted as T12. Alignment: Unchanged grade 1 anterolisthesis T12 on L1 without associated spondylolysis. Levoconvex curvature of the spine centered at L2. Vertebral bodies and posterior elements are otherwise normally aligned. Vertebrae: Sclerotic Modic type endplate changes noted at the L1-2 level with more mild change at the L2-3 and L4-5 levels as well demonstrating sclerosis and subcortical cystic change. Extensive Schmorl's node formations present throughout the lumbar spine. Remote compression deformities of the superior endplate L1, L2 and L4 are unchanged from priors. No new acute fracture or compression deformity is seen. Paraspinal and other soft tissues: No paravertebral fluid, swelling, gas or hemorrhage. No visible canal hematoma. Included portions of  the retroperitoneum demonstrate extensive aortoiliac atherosclerosis. Mild bilateral nonspecific perinephric stranding is unchanged from priors. Scattered colonic diverticula without focal inflammation to suggest diverticulitis. No other acute or suspicious posterior abdominopelvic abnormality is seen within  the margins of imaging. Disc levels: Level by level evaluation of the lumbar spine below: T10-T11: Moderate disc height loss, Schmorl's node formations. Minimal global disc bulge. Bilateral moderate facet arthropathy. No significant canal stenosis. Moderate left and mild right foraminal narrowing. T11-T12: Mild disc height loss, Schmorl's node formations. Minimal global disc bulge and mild bilateral facet arthropathy resulting in no significant canal stenosis but mild bilateral foraminal narrowing. T12-L1: Anterolisthesis and uncovering of an asymmetric global disc bulge eccentric to the left subarticular to extraforaminal zone. Associated moderate to severe bilateral facet arthropathy resulting and mild canal stenosis, partial effacement of the left lateral recess and severe left and moderate right foraminal narrowing. L1-L2: Severe disc height loss with extensive endplate spurring, Schmorl's node formations of Modic type endplate changes as well as moderate to severe bilateral facet arthropathy. Asymmetric disc bulge eccentric to the left subarticular to extraforaminal zone. Findings result in a moderate canal stenosis and moderate bilateral foraminal narrowing, right greater than left. L2-L3: Severe disc height loss, vacuum disc, endplate spurring and global disc bulge with severe bilateral facet arthropathy. Superimposed central disc protrusion. Moderate to severe canal stenosis. Effacement of the lateral recesses and severe bilateral foraminal narrowing, right greater than left. L3-L4: Severe disc height loss, vacuum disc, remote endplate deformities, shallow global disc bulge with severe bilateral facet  arthropathy. Mild canal stenosis and severe bilateral foraminal narrowing. L4-L5: Moderate to severe disc height loss with vacuum disc, Schmorl's node and Modic endplate changes as well as some endplate spurring partially calcified global disc bulge with superimposed calcified central and left foraminal disc protrusions and severe bilateral facet arthropathy. Moderate resulting canal stenosis and severe bilateral foraminal narrowing, left greater than right. L5-S1: Mild disc height loss without significant posterior disc abnormality. Mild bilateral facet arthropathy. No significant resulting canal stenosis and only mild bilateral foraminal narrowing. IMPRESSION: 1. 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 Lowest disc space will be denoted as L5-S1 in order to keep with most recent prior numbering systems. 2. Unchanged grade 1 anterolisthesis T12 on L1 without associated spondylolysis. 3. Remote compression deformities of the superior endplate L1, L2, and L4 are unchanged from priors. No new acute fracture or compression deformity is seen. 4. Severe multilevel degenerative changes of the lumbar spine as described above, with multilevel moderate to severe canal stenoses L1-L5 and severe bilateral foraminal narrowing L2-3 through L4-5. 5. Colonic diverticulosis without evidence of diverticulitis. 6. Aortic Atherosclerosis (ICD10-I70.0). Electronically Signed   By: Lovena Le M.D.   On: 06/16/2020 19:01   DG Hip Unilat W or Wo Pelvis 2-3 Views Right  Result Date: 06/16/2020 CLINICAL DATA:  Right lower back/hip pain for more than 1 week. No known injury. EXAM: DG HIP (WITH OR WITHOUT PELVIS) 2-3V RIGHT COMPARISON:  None. FINDINGS: Normal appearing right hip. Lower lumbar spine degenerative changes. IMPRESSION: Normal appearing right hip. Lower lumbar spine degenerative changes. Electronically Signed   By: Claudie Revering M.D.   On: 06/16/2020 17:28    Procedures Procedures  (including critical care time)  Medications Ordered in ED Medications  ibuprofen (ADVIL) tablet 800 mg (has no administration in time range)    ED Course  I have reviewed the triage vital signs and the nursing notes.  Pertinent labs & imaging results that were available during my care of the patient were reviewed by me and considered in my medical decision making (see chart for details). Patent with right low back pain, this appears most c.w. musculoskeletal etiology. No red  flags for cauda equina or acute neuro abnormalities.      MDM Rules/Calculators/A&P                           79 yo male complaining of right low back pain.  Imaging done due to age X-rays obtained of right hip without evidence of fx although old compression fxs seen.  Plan pain control with steroids and smr.  Patient advised re need for follow up and return precautions.   Final Clinical Impression(s) / ED Diagnoses Final diagnoses:  Acute right-sided low back pain with right-sided sciatica    Rx / DC Orders ED Discharge Orders    None       Pattricia Boss, MD 06/16/20 1940

## 2020-07-18 ENCOUNTER — Other Ambulatory Visit: Payer: Self-pay | Admitting: Endocrinology

## 2020-08-29 ENCOUNTER — Other Ambulatory Visit: Payer: Self-pay | Admitting: Endocrinology

## 2020-09-15 ENCOUNTER — Other Ambulatory Visit: Payer: Self-pay | Admitting: Endocrinology

## 2020-09-16 ENCOUNTER — Other Ambulatory Visit: Payer: Self-pay | Admitting: Endocrinology

## 2020-10-22 ENCOUNTER — Other Ambulatory Visit: Payer: Self-pay | Admitting: Family Medicine

## 2020-10-22 ENCOUNTER — Other Ambulatory Visit: Payer: Self-pay

## 2020-10-22 DIAGNOSIS — I10 Essential (primary) hypertension: Secondary | ICD-10-CM

## 2020-10-23 ENCOUNTER — Ambulatory Visit (INDEPENDENT_AMBULATORY_CARE_PROVIDER_SITE_OTHER): Payer: Medicare Other

## 2020-10-23 ENCOUNTER — Encounter: Payer: Self-pay | Admitting: Nurse Practitioner

## 2020-10-23 ENCOUNTER — Ambulatory Visit (INDEPENDENT_AMBULATORY_CARE_PROVIDER_SITE_OTHER): Payer: Medicare Other | Admitting: Nurse Practitioner

## 2020-10-23 VITALS — BP 130/70 | HR 74 | Temp 97.1°F | Ht 73.0 in | Wt 225.0 lb

## 2020-10-23 DIAGNOSIS — R0989 Other specified symptoms and signs involving the circulatory and respiratory systems: Secondary | ICD-10-CM

## 2020-10-23 DIAGNOSIS — R0789 Other chest pain: Secondary | ICD-10-CM

## 2020-10-23 DIAGNOSIS — Z23 Encounter for immunization: Secondary | ICD-10-CM

## 2020-10-23 NOTE — Progress Notes (Signed)
Subjective:  Patient ID: Larry Robertson, male    DOB: 1940/12/22  Age: 79 y.o. MRN: [id number]  CC: Acute Visit (Pt c/o pain on his right side under his ribs x2 weeks. Pt states it is painful to the touch, pt denies any known injury. )  Chest Pain  This is a new problem. The current episode started 1 to 4 weeks ago. The onset quality is gradual. The problem occurs constantly. The problem has been unchanged. The pain is present in the lateral region. The pain is moderate. The quality of the pain is described as dull. The pain does not radiate. Pertinent negatives include no abdominal pain, cough, exertional chest pressure, fever, irregular heartbeat, malaise/fatigue, nausea, orthopnea, palpitations, PND or shortness of breath. Risk factors include being elderly, lack of exercise and male gender.   Reviewed past Medical, Social and Family history today.  Outpatient Medications Prior to Visit  Medication Sig Dispense Refill  . ACCU-CHEK AVIVA PLUS test strip USE AS INSTRUCTED ONCE DAILY 50 strip 7  . allopurinol (ZYLOPRIM) 100 MG tablet Take one tablet every  day. 90 tablet 3  . b complex vitamins capsule Take 1 capsule by mouth daily. 90 capsule 1  . cloNIDine (CATAPRES) 0.1 MG tablet TAKE 1 TABLET (0.1 MG TOTAL) BY MOUTH 2 (TWO) TIMES DAILY. 180 tablet 3  . colchicine 0.6 MG tablet 1 tablet every hour as needed for gout, not to exceed 6/day 6 tablet 4  . cyclobenzaprine (FLEXERIL) 10 MG tablet Take 1 tablet (10 mg total) by mouth 2 (two) times daily as needed for muscle spasms. 20 tablet 0  . JANUVIA 100 MG tablet TAKE 1 TABLET BY MOUTH EVERY DAY 90 tablet 3  . metFORMIN (GLUCOPHAGE) 1000 MG tablet TAKE 1 TABLET BY MOUTH 2 TIMES DAILY WITH A MEAL. 180 tablet 1  . Omega-3 Fatty Acids (FISH OIL) 1000 MG CAPS Take 2 capsules (2,000 mg total) by mouth daily. 180 capsule 1  . oxyCODONE-acetaminophen (PERCOCET) 10-325 MG tablet Take 1 tablet by mouth 3 (three) times daily as needed for pain. 90 tablet  0  . repaglinide (PRANDIN) 0.5 MG tablet TAKE 1 TABLET BY MOUTH 3 TIMES DAILY BEFORE MEALS 90 tablet 11  . traZODone (DESYREL) 100 MG tablet Take 1 tablet by mouth at bedtime.    Marland Kitchen amLODipine-olmesartan (AZOR) 10-40 MG tablet TAKE 1 TABLET BY MOUTH EVERY DAY 90 tablet 1  . predniSONE (DELTASONE) 10 MG tablet Take 2 tablets (20 mg total) by mouth daily. 10 tablet 0  . atorvastatin (LIPITOR) 20 MG tablet Take 1 tablet (20 mg total) by mouth at bedtime. 90 tablet 1   No facility-administered medications prior to visit.    ROS See HPI  Objective:  BP 130/70 (BP Location: Left Arm, Patient Position: Sitting, Cuff Size: Large)   Pulse 74   Temp (!) 97.1 F (36.2 C) (Temporal)   Ht 6\' 1"  (1.854 m)   Wt 225 lb (102.1 kg)   SpO2 95%   BMI 29.69 kg/m   Physical Exam Cardiovascular:     Rate and Rhythm: Normal rate.     Pulses: Normal pulses.  Pulmonary:     Effort: No respiratory distress.     Breath sounds: No stridor. No wheezing, rhonchi or rales.     Comments: Diminished in RLL and RML Chest:     Chest wall: Tenderness present.  Abdominal:     General: Bowel sounds are normal.     Palpations: Abdomen is soft.  Tenderness: There is no abdominal tenderness. There is no right CVA tenderness, left CVA tenderness or guarding.     Hernia: A hernia is present.     Comments: Ventral hernia  Skin:    Findings: No erythema or rash.  Neurological:     Mental Status: He is oriented to person, place, and time.    Assessment & Plan:  This visit occurred during the SARS-CoV-2 public health emergency.  Safety protocols were in place, including screening questions prior to the visit, additional usage of staff PPE, and extensive cleaning of exam room while observing appropriate contact time as indicated for disinfecting solutions.   Larry Robertson was seen today for acute visit.  Diagnoses and all orders for this visit:  Right-sided chest wall pain -     DG Chest 2 View  Abnormal lung sounds  -     DG Chest 2 View  Influenza vaccine needed -     Flu Vaccine QUAD High Dose(Fluad)   Problem List Items Addressed This Visit    None    Visit Diagnoses    Right-sided chest wall pain    -  Primary   Relevant Orders   DG Chest 2 View (Completed)   Abnormal lung sounds       Relevant Orders   DG Chest 2 View (Completed)   Influenza vaccine needed       Relevant Orders   Flu Vaccine QUAD High Dose(Fluad) (Completed)      Follow-up: No follow-ups on file.  Wilfred Lacy, NP

## 2020-10-23 NOTE — Patient Instructions (Signed)
Go to lab for CXR Use tylenol 650mg  every 8hrs for pain

## 2020-10-25 ENCOUNTER — Other Ambulatory Visit: Payer: Self-pay | Admitting: Family Medicine

## 2020-10-25 DIAGNOSIS — I10 Essential (primary) hypertension: Secondary | ICD-10-CM

## 2020-10-29 ENCOUNTER — Ambulatory Visit: Payer: Medicare Other | Admitting: Family Medicine

## 2020-11-28 ENCOUNTER — Ambulatory Visit: Payer: Medicare Other | Admitting: Family Medicine

## 2020-12-11 ENCOUNTER — Ambulatory Visit (INDEPENDENT_AMBULATORY_CARE_PROVIDER_SITE_OTHER): Payer: Medicare Other | Admitting: Endocrinology

## 2020-12-11 ENCOUNTER — Other Ambulatory Visit: Payer: Self-pay

## 2020-12-11 ENCOUNTER — Encounter: Payer: Self-pay | Admitting: Endocrinology

## 2020-12-11 VITALS — BP 126/82 | HR 74 | Ht 75.0 in | Wt 223.0 lb

## 2020-12-11 DIAGNOSIS — E119 Type 2 diabetes mellitus without complications: Secondary | ICD-10-CM

## 2020-12-11 LAB — POCT GLYCOSYLATED HEMOGLOBIN (HGB A1C): Hemoglobin A1C: 7.2 % — AB (ref 4.0–5.6)

## 2020-12-11 MED ORDER — REPAGLINIDE 1 MG PO TABS: 1.0000 mg | ORAL_TABLET | Freq: Two times a day (BID) | ORAL | 3 refills | Status: DC

## 2020-12-11 NOTE — Patient Instructions (Addendum)
I have sent a prescription to your pharmacy, to increase repaglinide to 1 mg twice a day (breakfast and supper). Please continue the same other diabetes medications.  check your blood sugar once a day.  vary the time of day when you check, between before the 3 meals, and at bedtime.  also check if you have symptoms of your blood sugar being too high or too low.  please keep a record of the readings and bring it to your next appointment here (or you can bring the meter itself).  You can write it on any piece of paper.  please call us sooner if your blood sugar goes below 70, or if you have a lot of readings over 200. Please come back for a follow-up appointment in 6 months.

## 2020-12-11 NOTE — Progress Notes (Signed)
Subjective:    Patient ID: Larry Robertson, male    DOB: [birth year], 80 y.o.   MRN: [id number]  HPI Pt returns for f/u of diabetes mellitus:  DM type: 2 Dx'ed: 2010.  Complications: CRI Therapy: 3 oral meds.  DKA: never.  Severe hypoglycemia: never.  Pancreatitis: never.  Other: edema and nocturia (he no longer takes d-DAVP), limit oral rx options; he has never taken insulin.  Interval history: no cbg record, but states cbg's are well-controlled.  he takes meds as rx'ed.  pt states he feels well in general.  Past Medical History:  Diagnosis Date  . Arthritis   . Blood transfusion without reported diagnosis   . Colon polyps 05/19/2012  . 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 DIABETES MELLITUS, TYPE II 08/25/2007  . Diastolic dysfunction    on echo  . Diverticulosis   . ERECTILE DYSFUNCTION 08/25/2007  . 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 Heart murmur    but was not stated by DR Hilty  . Hepatitis    took treatment  . HEPATITIS C 08/25/2007  . HYPERTENSION 08/25/2007  . 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 HYPOGONADISM 09/27/2009    Past Surgical History:  Procedure Laterality Date  . ABDOMINAL HERNIA REPAIR  2000  . ANTERIOR CERVICAL DECOMP/DISCECTOMY FUSION Right 03/04/2018   Procedure: Cervical Three-Four, Cervical Six-Seven Right Side approach, Anterior cervical decompression/discectomy/fusion;  Surgeon: Erline Levine, MD;  Location: [facility];  Service: Neurosurgery;  Laterality: Right;  right side anterior approach  . CHOLECYSTECTOMY  1999  . COLONOSCOPY    . LUMBAR FUSION  2005  . TONSILLECTOMY AND ADENOIDECTOMY    . UPPER GASTROINTESTINAL ENDOSCOPY      Social History   Socioeconomic History  . Marital status: Single    Spouse name: Not on file  . Number of children: 7  . Years of education: Not on file  . Highest education level: Not on file  Occupational History  . 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 Smoking status: Never Smoker  . Smokeless tobacco: Never Used  Substance and Sexual Activity  . Alcohol use: Not Currently    Comment: Quit 3 months ago in July 2020.  Marland Kitchen Drug use: No  . Sexual activity: Not on file  Other Topics Concern  . Not on file  Social History Narrative   Divorced   Social Determinants of Health   Financial Resource Strain: Low Risk   . Difficulty of Paying Living Expenses: Not hard at all  Food Insecurity: No Food Insecurity  . Worried About Charity fundraiser in the Last Year: Never true  . Ran Out of Food in the Last Year: Never true  Transportation Needs: No Transportation Needs  . Lack of Transportation (Medical): No  . Lack of Transportation (Non-Medical): No  Physical Activity: Not on file  Stress: Not on file  Social Connections: Not on file  Intimate Partner Violence: Not on file    Current Outpatient Medications on File Prior to Visit  Medication Sig Dispense Refill  . ACCU-CHEK AVIVA PLUS test strip USE AS INSTRUCTED ONCE DAILY 50 strip 7  . allopurinol (ZYLOPRIM) 100 MG tablet Take one tablet every  day. 90 tablet 3  . amLODipine-olmesartan (AZOR) 10-40 MG tablet TAKE 1 TABLET BY MOUTH EVERY DAY 90 tablet 1  . b complex vitamins capsule Take 1 capsule by mouth daily. 90 capsule 1  . cloNIDine (CATAPRES)  0.1 MG tablet TAKE 1 TABLET (0.1 MG TOTAL) BY MOUTH 2 (TWO) TIMES DAILY. 180 tablet 3  . colchicine 0.6 MG tablet 1 tablet every hour as needed for gout, not to exceed 6/day 6 tablet 4  . cyclobenzaprine (FLEXERIL) 10 MG tablet Take 1 tablet (10 mg total) by mouth 2 (two) times daily as needed for muscle spasms. 20 tablet 0  . JANUVIA 100 MG tablet TAKE 1 TABLET BY MOUTH EVERY DAY 90 tablet 3  . metFORMIN (GLUCOPHAGE) 1000 MG tablet TAKE 1 TABLET BY MOUTH 2 TIMES DAILY WITH A MEAL. 180 tablet 1  . Omega-3 Fatty Acids (FISH OIL) 1000 MG CAPS Take 2 capsules (2,000 mg total) by mouth daily. 180 capsule 1  . oxyCODONE-acetaminophen  (PERCOCET) 10-325 MG tablet Take 1 tablet by mouth 3 (three) times daily as needed for pain. 90 tablet 0  . traZODone (DESYREL) 100 MG tablet Take 1 tablet by mouth at bedtime.    Marland Kitchen atorvastatin (LIPITOR) 20 MG tablet Take 1 tablet (20 mg total) by mouth at bedtime. 90 tablet 1   No current facility-administered medications on file prior to visit.    Allergies  Allergen Reactions  . Lyrica [Pregabalin] Other (See Comments)    Blurred vision  . Gabapentin Rash and Other (See Comments)    Diplopia (double vision)    Family History  Problem Relation Age of Onset  . Stroke Mother   . Diabetes Maternal Grandmother   . Cancer Neg Hx   . Colon cancer Neg Hx   . Esophageal cancer Neg Hx   . Rectal cancer Neg Hx   . Stomach cancer Neg Hx     BP 126/82   Pulse 74   Ht 6\' 3"  (1.905 m)   Wt 223 lb (101.2 kg)   SpO2 96%   BMI 27.87 kg/m    Review of Systems He denies hypoglycemia    Objective:   Physical Exam VITAL SIGNS:  See vs page GENERAL: no distress Pulses: dorsalis pedis intact bilat.   MSK: no deformity of the feet CV: trace bilat leg edema, and bilat vv's.   Skin:  no ulcer on the feet.  normal color and temp on the feet. Neuro: sensation is intact to touch on the feet.     Lab Results  Component Value Date   HGBA1C 7.2 (A) 12/11/2020   Lab Results  Component Value Date   CREATININE 0.98 04/25/2020   BUN 15 04/25/2020   NA 137 04/25/2020   K 4.4 04/25/2020   CL 103 04/25/2020   CO2 24 04/25/2020      Assessment & Plan:  Type 2 DM, with CRI: uncontrolled.   Patient Instructions  I have sent a prescription to your pharmacy, to increase repaglinide to 1 mg twice a day (breakfast and supper). Please continue the same other diabetes medications.  check your blood sugar once a day.  vary the time of day when you check, between before the 3 meals, and at bedtime.  also check if you have symptoms of your blood sugar being too high or too low.  please keep a  record of the readings and bring it to your next appointment here (or you can bring the meter itself).  You can write it on any piece of paper.  please call us sooner if your blood sugar goes below 70, or if you have a lot of readings over 200. Please come back for a follow-up appointment in 6 months.

## 2020-12-13 DIAGNOSIS — Z79899 Other long term (current) drug therapy: Secondary | ICD-10-CM | POA: Diagnosis not present

## 2020-12-13 DIAGNOSIS — Z79891 Long term (current) use of opiate analgesic: Secondary | ICD-10-CM | POA: Diagnosis not present

## 2021-01-02 ENCOUNTER — Ambulatory Visit: Payer: Medicare Other | Admitting: Family Medicine

## 2021-01-03 DIAGNOSIS — Z79891 Long term (current) use of opiate analgesic: Secondary | ICD-10-CM | POA: Diagnosis not present

## 2021-01-03 DIAGNOSIS — Z79899 Other long term (current) drug therapy: Secondary | ICD-10-CM | POA: Diagnosis not present

## 2021-01-21 ENCOUNTER — Telehealth: Payer: Self-pay | Admitting: Family Medicine

## 2021-01-21 NOTE — Telephone Encounter (Signed)
Left message for patient to schedule Annual Wellness Visit.  Please schedule with Nurse Health Advisor Martha Stanley, RN at [facility]  °

## 2021-01-24 DIAGNOSIS — Z79899 Other long term (current) drug therapy: Secondary | ICD-10-CM | POA: Diagnosis not present

## 2021-01-24 DIAGNOSIS — Z79891 Long term (current) use of opiate analgesic: Secondary | ICD-10-CM | POA: Diagnosis not present

## 2021-02-07 ENCOUNTER — Encounter: Payer: Self-pay | Admitting: Family Medicine

## 2021-02-07 ENCOUNTER — Ambulatory Visit (INDEPENDENT_AMBULATORY_CARE_PROVIDER_SITE_OTHER): Payer: Medicare Other | Admitting: Family Medicine

## 2021-02-07 ENCOUNTER — Other Ambulatory Visit: Payer: Self-pay

## 2021-02-07 VITALS — BP 148/80 | HR 73 | Temp 97.5°F | Ht 75.0 in | Wt 223.0 lb

## 2021-02-07 DIAGNOSIS — N1832 Chronic kidney disease, stage 3b: Secondary | ICD-10-CM

## 2021-02-07 DIAGNOSIS — I10 Essential (primary) hypertension: Secondary | ICD-10-CM | POA: Diagnosis not present

## 2021-02-07 DIAGNOSIS — M109 Gout, unspecified: Secondary | ICD-10-CM

## 2021-02-07 DIAGNOSIS — E538 Deficiency of other specified B group vitamins: Secondary | ICD-10-CM | POA: Diagnosis not present

## 2021-02-07 DIAGNOSIS — E7800 Pure hypercholesterolemia, unspecified: Secondary | ICD-10-CM

## 2021-02-07 LAB — LDL CHOLESTEROL, DIRECT: Direct LDL: 99 mg/dL

## 2021-02-07 LAB — URINALYSIS, ROUTINE W REFLEX MICROSCOPIC
Bilirubin Urine: NEGATIVE
Hgb urine dipstick: NEGATIVE
Ketones, ur: NEGATIVE
Leukocytes,Ua: NEGATIVE
Nitrite: NEGATIVE
Specific Gravity, Urine: 1.02 (ref 1.000–1.030)
Total Protein, Urine: NEGATIVE
Urine Glucose: 500 — AB
Urobilinogen, UA: 0.2 (ref 0.0–1.0)
pH: 6.5 (ref 5.0–8.0)

## 2021-02-07 LAB — COMPREHENSIVE METABOLIC PANEL
ALT: 29 U/L (ref 0–53)
AST: 21 U/L (ref 0–37)
Albumin: 4.7 g/dL (ref 3.5–5.2)
Alkaline Phosphatase: 42 U/L (ref 39–117)
BUN: 10 mg/dL (ref 6–23)
CO2: 28 mEq/L (ref 19–32)
Calcium: 9.8 mg/dL (ref 8.4–10.5)
Chloride: 99 mEq/L (ref 96–112)
Creatinine, Ser: 0.94 mg/dL (ref 0.40–1.50)
GFR: 76.37 mL/min (ref >60.00)
Glucose, Bld: 266 mg/dL — ABNORMAL HIGH (ref 70–99)
Potassium: 4 mEq/L (ref 3.5–5.1)
Sodium: 135 mEq/L (ref 135–145)
Total Bilirubin: 0.8 mg/dL (ref 0.2–1.2)
Total Protein: 7.3 g/dL (ref 6.0–8.3)

## 2021-02-07 LAB — CBC
HCT: 40.9 % (ref 39.0–52.0)
Hemoglobin: 14.1 g/dL (ref 13.0–17.0)
MCHC: 34.4 g/dL (ref 30.0–36.0)
MCV: 88.9 fl (ref 78.0–100.0)
Platelets: 204 10*3/uL (ref 150.0–400.0)
RBC: 4.61 Mil/uL (ref 4.22–5.81)
RDW: 13 % (ref 11.5–15.5)
WBC: 6.7 10*3/uL (ref 4.0–10.5)

## 2021-02-07 LAB — VITAMIN B12: Vitamin B-12: 466 pg/mL (ref 211–911)

## 2021-02-07 LAB — URIC ACID: Uric Acid, Serum: 6 mg/dL (ref 4.0–7.8)

## 2021-02-07 NOTE — Progress Notes (Signed)
Established Patient Office Visit  Subjective:  Patient ID: Larry Robertson, male    DOB: 1941-08-21  Age: 80 y.o. MRN: [id number]  CC:  Chief Complaint  Patient presents with  . Follow-up    Follow up, no concerns.     HPI Larry Robertson presents for follow-up of hypertension controlled with Azor and clonidine.  Blood pressure typically runs in the 130s over 70 to 80s.  He has been taking allopurinol daily for gout control.  Continues with fish oil and atorvastatin for his elevated cholesterol and lipids.  He is taking the B complex for low B12 levels.  Denies alcohol usage for almost a year now.  Dealing with a lot of knee pain due to severe arthritis.  Continues with pain management.  He is nonfasting this morning.  Past Medical History:  Diagnosis Date  . Arthritis   . Blood transfusion without reported diagnosis   . Colon polyps 05/19/2012  . 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 DIABETES MELLITUS, TYPE II 08/25/2007  . Diastolic dysfunction    on echo  . Diverticulosis   . ERECTILE DYSFUNCTION 08/25/2007  . 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 Heart murmur    but was not stated by DR Hilty  . Hepatitis    took treatment  . HEPATITIS C 08/25/2007  . HYPERTENSION 08/25/2007  . 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 HYPOGONADISM 09/27/2009    Past Surgical History:  Procedure Laterality Date  . ABDOMINAL HERNIA REPAIR  2000  . ANTERIOR CERVICAL DECOMP/DISCECTOMY FUSION Right 03/04/2018   Procedure: Cervical Three-Four, Cervical Six-Seven Right Side approach, Anterior cervical decompression/discectomy/fusion;  Surgeon: Erline Levine, MD;  Location: [facility];  Service: Neurosurgery;  Laterality: Right;  right side anterior approach  . CHOLECYSTECTOMY  1999  . COLONOSCOPY    . LUMBAR FUSION  2005  . TONSILLECTOMY AND ADENOIDECTOMY    . UPPER GASTROINTESTINAL ENDOSCOPY      Family History  Problem Relation Age of Onset  . Stroke Mother   . Diabetes  Maternal Grandmother   . Cancer Neg Hx   . Colon cancer Neg Hx   . Esophageal cancer Neg Hx   . Rectal cancer Neg Hx   . Stomach cancer Neg Hx     Social History   Socioeconomic History  . Marital status: Single    Spouse name: Not on file  . Number of children: 7  . Years of education: Not on file  . Highest education level: Not on file  Occupational History  . 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 Smoking status: Never Smoker  . Smokeless tobacco: Never Used  Substance and Sexual Activity  . Alcohol use: Not Currently    Comment: Quit 3 months ago in July 2020.  Marland Kitchen Drug use: No  . Sexual activity: Not on file  Other Topics Concern  . Not on file  Social History Narrative   Divorced   Social Determinants of Health   Financial Resource Strain: Low Risk   . Difficulty of Paying Living Expenses: Not hard at all  Food Insecurity: No Food Insecurity  . Worried About Charity fundraiser in the Last Year: Never true  . Ran Out of Food in the Last Year: Never true  Transportation Needs: No Transportation Needs  . Lack of Transportation (Medical): No  . Lack of Transportation (Non-Medical): No  Physical Activity: Not on file  Stress: Not  on file  Social Connections: Not on file  Intimate Partner Violence: Not on file    Outpatient Medications Prior to Visit  Medication Sig Dispense Refill  . ACCU-CHEK AVIVA PLUS test strip USE AS INSTRUCTED ONCE DAILY 50 strip 7  . allopurinol (ZYLOPRIM) 100 MG tablet Take one tablet every  day. 90 tablet 3  . amLODipine-olmesartan (AZOR) 10-40 MG tablet TAKE 1 TABLET BY MOUTH EVERY DAY 90 tablet 1  . b complex vitamins capsule Take 1 capsule by mouth daily. 90 capsule 1  . cloNIDine (CATAPRES) 0.1 MG tablet TAKE 1 TABLET (0.1 MG TOTAL) BY MOUTH 2 (TWO) TIMES DAILY. 180 tablet 3  . colchicine 0.6 MG tablet 1 tablet every hour as needed for gout, not to exceed 6/day 6 tablet 4  . cyclobenzaprine  (FLEXERIL) 10 MG tablet Take 1 tablet (10 mg total) by mouth 2 (two) times daily as needed for muscle spasms. 20 tablet 0  . JANUVIA 100 MG tablet TAKE 1 TABLET BY MOUTH EVERY DAY 90 tablet 3  . metFORMIN (GLUCOPHAGE) 1000 MG tablet TAKE 1 TABLET BY MOUTH 2 TIMES DAILY WITH A MEAL. 180 tablet 1  . Omega-3 Fatty Acids (FISH OIL) 1000 MG CAPS Take 2 capsules (2,000 mg total) by mouth daily. 180 capsule 1  . oxyCODONE-acetaminophen (PERCOCET) 10-325 MG tablet Take 1 tablet by mouth 3 (three) times daily as needed for pain. 90 tablet 0  . repaglinide (PRANDIN) 1 MG tablet Take 1 tablet (1 mg total) by mouth 2 (two) times daily before a meal. 180 tablet 3  . traZODone (DESYREL) 100 MG tablet Take 1 tablet by mouth at bedtime.    Marland Kitchen atorvastatin (LIPITOR) 20 MG tablet Take 1 tablet (20 mg total) by mouth at bedtime. 90 tablet 1   No facility-administered medications prior to visit.    Allergies  Allergen Reactions  . Lyrica [Pregabalin] Other (See Comments)    Blurred vision  . Gabapentin Rash and Other (See Comments)    Diplopia (double vision)    ROS Review of Systems  Constitutional: Negative.   HENT: Negative.   Eyes: Negative for photophobia and visual disturbance.  Respiratory: Negative.   Cardiovascular: Negative.   Gastrointestinal: Negative.   Endocrine: Negative for polyphagia.  Genitourinary: Negative.   Musculoskeletal: Positive for arthralgias and back pain.  Skin: Negative for color change and rash.  Allergic/Immunologic: Negative for immunocompromised state.  Neurological: Negative for weakness and numbness.  Hematological: Negative.   Psychiatric/Behavioral: Negative.       Objective:    Physical Exam Vitals and nursing note reviewed.  Constitutional:      General: He is not in acute distress.    Appearance: Normal appearance. He is not ill-appearing, toxic-appearing or diaphoretic.  HENT:     Head: Normocephalic and atraumatic.     Right Ear: External ear  normal.     Left Ear: External ear normal.  Eyes:     General: No scleral icterus.       Right eye: No discharge.        Left eye: No discharge.     Extraocular Movements: Extraocular movements intact.     Conjunctiva/sclera: Conjunctivae normal.     Pupils: Pupils are equal, round, and reactive to light.  Cardiovascular:     Rate and Rhythm: Normal rate and regular rhythm.  Pulmonary:     Effort: Pulmonary effort is normal.     Breath sounds: Normal breath sounds.  Abdominal:     General:  Bowel sounds are normal.  Musculoskeletal:     Cervical back: Neck supple. No rigidity or tenderness.  Lymphadenopathy:     Cervical: No cervical adenopathy.  Skin:    General: Skin is warm and dry.  Neurological:     Mental Status: He is alert and oriented to person, place, and time.  Psychiatric:        Mood and Affect: Mood normal.        Behavior: Behavior normal.     BP (!) 148/80   Pulse 73   Temp (!) 97.5 F (36.4 C) (Temporal)   Ht 6\' 3"  (1.905 m)   Wt 223 lb (101.2 kg)   SpO2 96%   BMI 27.87 kg/m  Wt Readings from Last 3 Encounters:  02/07/21 223 lb (101.2 kg)  12/11/20 223 lb (101.2 kg)  10/23/20 225 lb (102.1 kg)     Health Maintenance Due  Topic Date Due  . OPHTHALMOLOGY EXAM  08/31/2020    There are no preventive care reminders to display for this patient.  Lab Results  Component Value Date   TSH 3.38 09/15/2019   Lab Results  Component Value Date   WBC 6.9 04/25/2020   HGB 15.0 04/25/2020   HCT 43.9 04/25/2020   MCV 90.7 04/25/2020   PLT 185.0 04/25/2020   Lab Results  Component Value Date   NA 137 04/25/2020   K 4.4 04/25/2020   CO2 24 04/25/2020   GLUCOSE 134 (H) 04/25/2020   BUN 15 04/25/2020   CREATININE 0.98 04/25/2020   BILITOT 0.6 04/25/2020   ALKPHOS 39 04/25/2020   AST 16 04/25/2020   ALT 25 04/25/2020   PROT 7.2 04/25/2020   ALBUMIN 4.9 04/25/2020   CALCIUM 10.2 04/25/2020   ANIONGAP 14 02/24/2018   GFR 73.58 04/25/2020   Lab  Results  Component Value Date   CHOL 194 04/25/2020   Lab Results  Component Value Date   HDL 38.90 (L) 04/25/2020   Lab Results  Component Value Date   LDLCALC 39 01/21/2018   Lab Results  Component Value Date   TRIG (H) 04/25/2020    432.0 Triglyceride is over 400; calculations on Lipids are invalid.   Lab Results  Component Value Date   CHOLHDL 5 04/25/2020   Lab Results  Component Value Date   HGBA1C 7.2 (A) 12/11/2020      Assessment & Plan:   Problem List Items Addressed This Visit      Cardiovascular and Mediastinum   Essential hypertension   Relevant Orders   CBC   Comprehensive metabolic panel   Urinalysis, Routine w reflex microscopic     Musculoskeletal and Integument   Gouty arthropathy   Relevant Orders   Uric acid     Genitourinary   Stage 3b chronic kidney disease ([place])   Relevant Orders   Comprehensive metabolic panel     Other   Elevated cholesterol   Relevant Orders   Comprehensive metabolic panel   LDL cholesterol, direct   B12 deficiency - Primary   Relevant Orders   Vitamin B12      No orders of the defined types were placed in this encounter.   Follow-up: Return in about 6 months (around 08/10/2021), or if symptoms worsen or fail to improve.   Continue all medicines as above.  Asked him to return for the next visit fasting. Libby Maw, MD

## 2021-02-14 DIAGNOSIS — Z79891 Long term (current) use of opiate analgesic: Secondary | ICD-10-CM | POA: Diagnosis not present

## 2021-02-14 DIAGNOSIS — Z79899 Other long term (current) drug therapy: Secondary | ICD-10-CM | POA: Diagnosis not present

## 2021-02-17 ENCOUNTER — Other Ambulatory Visit: Payer: Self-pay | Admitting: Endocrinology

## 2021-03-07 DIAGNOSIS — Z79899 Other long term (current) drug therapy: Secondary | ICD-10-CM | POA: Diagnosis not present

## 2021-03-07 DIAGNOSIS — Z79891 Long term (current) use of opiate analgesic: Secondary | ICD-10-CM | POA: Diagnosis not present

## 2021-03-11 NOTE — Progress Notes (Deleted)
Subjective:   Larry Robertson is a 80 y.o. male who presents for Medicare Annual/Subsequent preventive examination.  I connected with Coleman today by telephone and verified that I am speaking with the correct person using two identifiers. Location patient: home Location provider: work Persons participating in the virtual visit: patient, Marine scientist.    I discussed the limitations, risks, security and privacy concerns of performing an evaluation and management service by telephone and the availability of in person appointments. I also discussed with the patient that there may be a patient responsible charge related to this service. The patient expressed understanding and verbally consented to this telephonic visit.    Interactive audio and video telecommunications were attempted between this provider and patient, however failed, due to patient having technical difficulties OR patient did not have access to video capability.  We continued and completed visit with audio only.  Some vital signs may be absent or patient reported.   Time Spent with patient on telephone encounter: *** minutes   Review of Systems    ***       Objective:    There were no vitals filed for this visit. There is no height or weight on file to calculate BMI.  Advanced Directives 06/16/2020 02/29/2020 09/01/2018 02/24/2018 03/11/2017 03/03/2017 10/15/2016  Does Patient Have a Medical Advance Directive? No No No No No No No  Would patient like information on creating a medical advance directive? No - Patient declined No - Patient declined - No - Patient declined - - -    Current Medications (verified) Outpatient Encounter Medications as of 03/12/2021  Medication Sig  . ACCU-CHEK AVIVA PLUS test strip USE AS INSTRUCTED ONCE DAILY  . allopurinol (ZYLOPRIM) 100 MG tablet Take one tablet every  day.  Marland Kitchen amLODipine-olmesartan (AZOR) 10-40 MG tablet TAKE 1 TABLET BY MOUTH EVERY DAY  . b complex vitamins capsule Take 1 capsule by  mouth daily.  . cloNIDine (CATAPRES) 0.1 MG tablet TAKE 1 TABLET (0.1 MG TOTAL) BY MOUTH 2 (TWO) TIMES DAILY.  Marland Kitchen colchicine 0.6 MG tablet 1 tablet every hour as needed for gout, not to exceed 6/day  . cyclobenzaprine (FLEXERIL) 10 MG tablet Take 1 tablet (10 mg total) by mouth 2 (two) times daily as needed for muscle spasms.  Marland Kitchen JANUVIA 100 MG tablet TAKE 1 TABLET BY MOUTH EVERY DAY  . metFORMIN (GLUCOPHAGE) 1000 MG tablet TAKE 1 TABLET BY MOUTH 2 TIMES DAILY WITH A MEAL.  Marland Kitchen Omega-3 Fatty Acids (FISH OIL) 1000 MG CAPS Take 2 capsules (2,000 mg total) by mouth daily.  Marland Kitchen oxyCODONE-acetaminophen (PERCOCET) 10-325 MG tablet Take 1 tablet by mouth 3 (three) times daily as needed for pain.  . repaglinide (PRANDIN) 1 MG tablet Take 1 tablet (1 mg total) by mouth 2 (two) times daily before a meal.  . traZODone (DESYREL) 100 MG tablet Take 1 tablet by mouth at bedtime.   No facility-administered encounter medications on file as of 03/12/2021.    Allergies (verified) Lyrica [pregabalin] and Gabapentin   History: Past Medical History:  Diagnosis Date  . Arthritis   . Blood transfusion without reported diagnosis   . Colon polyps 05/19/2012  . 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 DIABETES MELLITUS, TYPE II 08/25/2007  . Diastolic dysfunction    on echo  . Diverticulosis   . ERECTILE DYSFUNCTION 08/25/2007  . 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 Heart murmur    but was not stated by DR Hilty  . Hepatitis  took treatment  . HEPATITIS C 08/25/2007  . HYPERTENSION 08/25/2007  . 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 HYPOGONADISM 09/27/2009   Past Surgical History:  Procedure Laterality Date  . ABDOMINAL HERNIA REPAIR  2000  . ANTERIOR CERVICAL DECOMP/DISCECTOMY FUSION Right 03/04/2018   Procedure: Cervical Three-Four, Cervical Six-Seven Right Side approach, Anterior cervical decompression/discectomy/fusion;  Surgeon: Erline Levine, MD;  Location[place];  Service:  Neurosurgery;  Laterality: Right;  right side anterior approach  . CHOLECYSTECTOMY  1999  . COLONOSCOPY    . LUMBAR FUSION  2005  . TONSILLECTOMY AND ADENOIDECTOMY    . UPPER GASTROINTESTINAL ENDOSCOPY     Family History  Problem Relation Age of Onset  . Stroke Mother   . Diabetes Maternal Grandmother   . Cancer Neg Hx   . Colon cancer Neg Hx   . Esophageal cancer Neg Hx   . Rectal cancer Neg Hx   . Stomach cancer Neg Hx    Social History   Socioeconomic History  . Marital status: Single    Spouse name: Not on file  . Number of children: 7  . Years of education: Not on file  . Highest education level: Not on file  Occupational History  . 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 Smoking status: Never Smoker  . Smokeless tobacco: Never Used  Substance and Sexual Activity  . Alcohol use: Not Currently    Comment: Quit 3 months ago in July 2020.  Marland Kitchen Drug use: No  . Sexual activity: Not on file  Other Topics Concern  . Not on file  Social History Narrative   Divorced   Social Determinants of Health   Financial Resource Strain: Not on file  Food Insecurity: Not on file  Transportation Needs: Not on file  Physical Activity: Not on file  Stress: Not on file  Social Connections: Not on file    Tobacco Counseling Counseling given: Not Answered   Clinical Intake:                 Diabetes:  Is the patient diabetic?  Yes  If diabetic, was a CBG obtained today?  No  Did the patient bring in their glucometer from home?  No phone visit How often do you monitor your CBG's? ***.   Financial Strains and Diabetes Management:  Are you having any financial strains with the device, your supplies or your medication? {YES/NO:21197}.  Does the patient want to be seen by Chronic Care Management for management of their diabetes?  {YES/NO:21197} Would the patient like to be referred to a Nutritionist or for Diabetic Management?   {YES/NO:21197}  Diabetic Exams:  Diabetic Eye Exam: Completed ***. Overdue for diabetic eye exam. Pt has been advised about the importance in completing this exam. A referral has been placed today. Message sent to referral coordinator for scheduling purposes. Advised pt to expect a call from our office re: appt.  Diabetic Foot Exam: Completed 12/11/2020.          Activities of Daily Living No flowsheet data found.  Patient Care Team: Libby Maw, MD as PCP - General (Family Medicine) Debara Pickett Nadean Corwin, MD as PCP - Cardiology (Cardiology) Earlean Polka, MD (Ophthalmology) Inda Castle, MD (Inactive) as Attending Physician (Gastroenterology) Renato Shin, MD as Consulting Physician (Endocrinology)  Indicate any recent Medical Services you may have received from other than [facility] providers in the past year (date may be approximate).  Assessment:   This is a routine wellness examination for [place].  Hearing/Vision screen No exam data present  Dietary issues and exercise activities discussed:    Goals    . Increase physical activity      Depression Screen PHQ 2/9 Scores 02/07/2021 02/29/2020 03/09/2019 02/09/2019 01/11/2019 11/08/2018 11/05/2018  PHQ - 2 Score 0 0 0 0 0 0 0  PHQ- 9 Score - - - - - - -    Fall Risk Fall Risk  02/07/2021 10/23/2020 02/29/2020 10/25/2019 04/12/2019  Falls in the past year? 0 0 0 0 0  Number falls in past yr: - 0 0 - -  Injury with Fall? - 0 0 - -  Follow up - - Education provided;Falls prevention discussed - -    FALL RISK PREVENTION PERTAINING TO THE HOME:  Any stairs in or around the home? {YES/NO:21197} If so, are there any without handrails? {YES/NO:21197} Home free of loose throw rugs in walkways, pet beds, electrical cords, etc? {YES/NO:21197} Adequate lighting in your home to reduce risk of falls? {YES/NO:21197}  ASSISTIVE DEVICES UTILIZED TO PREVENT FALLS:  Life alert? {YES/NO:21197} Use of a cane, walker or w/c?  {YES/NO:21197} Grab bars in the bathroom? {YES/NO:21197} Shower chair or bench in shower? {YES/NO:21197} Elevated toilet seat or a handicapped toilet? {YES/NO:21197}  TIMED UP AND GO:  Was the test performed? {YES/NO:21197}.  Length of time to ambulate 10 feet: *** sec.   {Appearance of ZOXW:9604540}  Cognitive Function:        Immunizations Immunization History  Administered Date(s) Administered  . Fluad Quad(high Dose 65+) 09/15/2019, 10/23/2020  . Influenza, High Dose Seasonal PF 09/08/2017, 09/01/2018  . Influenza,inj,Quad PF,6+ Mos 08/09/2014, 10/15/2015, 10/15/2016  . PFIZER Comirnaty(Gray Top)Covid-19 Tri-Sucrose Vaccine 12/14/2020  . PFIZER(Purple Top)SARS-COV-2 Vaccination 03/08/2020, 04/02/2020  . Pneumococcal Conjugate-13 08/09/2014  . Pneumococcal Polysaccharide-23 02/16/2006  . Td 08/31/2000  . Tdap 08/09/2014  . Zoster 12/01/2009    TDAP status: Up to date  Flu Vaccine status: Up to date  Pneumococcal vaccine status: Up to date  Covid-19 vaccine status: Completed vaccines  Qualifies for Shingles Vaccine? Yes   Zostavax completed Yes   Shingrix Completed?: No.    Education has been provided regarding the importance of this vaccine. Patient has been advised to call insurance company to determine out of pocket expense if they have not yet received this vaccine. Advised may also receive vaccine at local pharmacy or Health Dept. Verbalized acceptance and understanding.  Screening Tests Health Maintenance  Topic Date Due  . OPHTHALMOLOGY EXAM  08/31/2020  . HEMOGLOBIN A1C  06/10/2021  . INFLUENZA VACCINE  07/01/2021  . FOOT EXAM  12/11/2021  . COLONOSCOPY (Pts 45-63yrs Insurance coverage will need to be confirmed)  03/11/2022  . TETANUS/TDAP  08/09/2024  . COVID-19 Vaccine  Completed  . PNA vac Low Risk Adult  Completed  . HPV VACCINES  Aged Out    Health Maintenance  Health Maintenance Due  Topic Date Due  . OPHTHALMOLOGY EXAM  08/31/2020     Colorectal cancer screening: No longer required.   Lung Cancer Screening: (Low Dose CT Chest recommended if Age 1-80 years, 30 pack-year currently smoking OR have quit w/in 15years.) does not qualify.     Additional Screening:  Hepatitis C Screening: does not qualify  Vision Screening: Recommended annual ophthalmology exams for early detection of glaucoma and other disorders of the eye. Is the patient up to date with their annual eye exam?  {YES/NO:21197} Who is the provider  or what is the name of the office in which the patient attends annual eye exams? *** If pt is not established with a provider, would they like to be referred to a provider to establish care? {YES/NO:21197}.   Dental Screening: Recommended annual dental exams for proper oral hygiene  Community Resource Referral / Chronic Care Management: CRR required this visit?  {YES/NO:21197}  CCM required this visit?  {YES/NO:21197}     Plan:     I have personally reviewed and noted the following in the patient's chart:   . Medical and social history . Use of alcohol, tobacco or illicit drugs  . Current medications and supplements . Functional ability and status . Nutritional status . Physical activity . Advanced directives . List of other physicians . Hospitalizations, surgeries, and ER visits in previous 12 months . Vitals . Screenings to include cognitive, depression, and falls . Referrals and appointments  In addition, I have reviewed and discussed with patient certain preventive protocols, quality metrics, and best practice recommendations. A written personalized care plan for preventive services as well as general preventive health recommendations were provided to patient.   Due to this being a telephonic visit, the after visit summary with patients personalized plan was offered to patient via mail or my-chart. ***Patient declined at this time./ Patient would like to access on my-chart/ per request,  patient was mailed a copy of AVS./ Patient preferred to pick up at office at next visit.   Marta Antu, LPN   12/11/5518  Nurse Health Advisor  Nurse Notes: ***

## 2021-03-12 ENCOUNTER — Telehealth: Payer: Self-pay

## 2021-03-12 ENCOUNTER — Ambulatory Visit: Payer: Medicare Other

## 2021-03-12 ENCOUNTER — Telehealth: Payer: Self-pay | Admitting: Family Medicine

## 2021-03-12 NOTE — Telephone Encounter (Signed)
Attempted x 3 to reach patient for scheduled Medicare Wellness visit. Left message to call back to reschedule.

## 2021-03-12 NOTE — Telephone Encounter (Signed)
[  12:14 PM] Larry Robertson, Larry Robertson Jun 05, 1941 MRN [id number] said that he was supposed to have AWV at 9, at 10 he called and was rs to 11:15. He said he never got a call. He is requesting a call back today please.

## 2021-03-12 NOTE — Telephone Encounter (Signed)
Patient had a 9am appt for a phone visit to completed his Medicare Wellness exam. 3 attempted were made to reach him at that time. He called back & rescheduled for 11:15 & stated no one called him for the 9am appt. Telephone number was verified by staff. Attempted 4 times to reach patient for 11:15 phone visit. When number is dialed, it rings once & then goes straight to voice mail. Left message for patient to call back.

## 2021-03-13 NOTE — Telephone Encounter (Signed)
As previously documented , I called the patient 3 times for his 9am appt. Phone rang x 1 each time & then went straight to voicemail. I called 8-10 times for the 11:15 appt & phone rang once each time & went straight to voicemail. I left 2 messages for patient.

## 2021-03-28 DIAGNOSIS — Z79891 Long term (current) use of opiate analgesic: Secondary | ICD-10-CM | POA: Diagnosis not present

## 2021-03-28 DIAGNOSIS — Z79899 Other long term (current) drug therapy: Secondary | ICD-10-CM | POA: Diagnosis not present

## 2021-04-11 ENCOUNTER — Encounter: Payer: Self-pay | Admitting: Family Medicine

## 2021-04-11 ENCOUNTER — Other Ambulatory Visit: Payer: Self-pay

## 2021-04-11 ENCOUNTER — Ambulatory Visit (INDEPENDENT_AMBULATORY_CARE_PROVIDER_SITE_OTHER): Payer: Medicare Other

## 2021-04-11 ENCOUNTER — Ambulatory Visit (INDEPENDENT_AMBULATORY_CARE_PROVIDER_SITE_OTHER): Payer: Medicare Other | Admitting: Family Medicine

## 2021-04-11 VITALS — BP 134/80 | HR 71 | Temp 97.6°F | Ht 75.0 in | Wt 218.2 lb

## 2021-04-11 DIAGNOSIS — R0989 Other specified symptoms and signs involving the circulatory and respiratory systems: Secondary | ICD-10-CM | POA: Diagnosis not present

## 2021-04-11 DIAGNOSIS — R0789 Other chest pain: Secondary | ICD-10-CM

## 2021-04-11 DIAGNOSIS — R140 Abdominal distension (gaseous): Secondary | ICD-10-CM

## 2021-04-11 DIAGNOSIS — J986 Disorders of diaphragm: Secondary | ICD-10-CM | POA: Diagnosis not present

## 2021-04-11 LAB — COMPREHENSIVE METABOLIC PANEL
ALT: 25 U/L (ref 0–53)
AST: 21 U/L (ref 0–37)
Albumin: 4.7 g/dL (ref 3.5–5.2)
Alkaline Phosphatase: 42 U/L (ref 39–117)
BUN: 13 mg/dL (ref 6–23)
CO2: 30 mEq/L (ref 19–32)
Calcium: 9.6 mg/dL (ref 8.4–10.5)
Chloride: 99 mEq/L (ref 96–112)
Creatinine, Ser: 1.04 mg/dL (ref 0.40–1.50)
GFR: 67.56 mL/min (ref >60.00)
Glucose, Bld: 162 mg/dL — ABNORMAL HIGH (ref 70–99)
Potassium: 4.4 mEq/L (ref 3.5–5.1)
Sodium: 137 mEq/L (ref 135–145)
Total Bilirubin: 0.7 mg/dL (ref 0.2–1.2)
Total Protein: 7.1 g/dL (ref 6.0–8.3)

## 2021-04-11 LAB — LIPASE: Lipase: 29 U/L (ref 11.0–59.0)

## 2021-04-11 LAB — URINALYSIS, ROUTINE W REFLEX MICROSCOPIC
Bilirubin Urine: NEGATIVE
Hgb urine dipstick: NEGATIVE
Ketones, ur: NEGATIVE
Leukocytes,Ua: NEGATIVE
Nitrite: NEGATIVE
Specific Gravity, Urine: 1.01 (ref 1.000–1.030)
Total Protein, Urine: NEGATIVE
Urine Glucose: NEGATIVE
Urobilinogen, UA: 0.2 (ref 0.0–1.0)
pH: 6 (ref 5.0–8.0)

## 2021-04-11 LAB — CBC
HCT: 40.9 % (ref 39.0–52.0)
Hemoglobin: 14.2 g/dL (ref 13.0–17.0)
MCHC: 34.9 g/dL (ref 30.0–36.0)
MCV: 89.3 fl (ref 78.0–100.0)
Platelets: 193 10*3/uL (ref 150.0–400.0)
RBC: 4.58 Mil/uL (ref 4.22–5.81)
RDW: 12.5 % (ref 11.5–15.5)
WBC: 7.5 10*3/uL (ref 4.0–10.5)

## 2021-04-11 LAB — AMYLASE: Amylase: 47 U/L (ref 27–131)

## 2021-04-11 NOTE — Progress Notes (Addendum)
Established Patient Office Visit  Subjective:  Patient ID: Larry Robertson, male    DOB: 22-Aug-1941  Age: 80 y.o. MRN: [id number]  CC:  Chief Complaint  Patient presents with  . Pain    Left lower side pains x 1 month becoming worse.     HPI Larry Robertson presents for 1 month history of gradually worsening left side pain.  There has been no injury.  Denies fever chills cough or shortness of breath.  There is no nausea or diaphoresis.  There is no exertional component.  Stooling normally.  History of diverticulosis by colonoscopy in 2018.  No hematochezia.  No hematuria.  Cyst noted on left kidney from CT scan in 2018.  Past Medical History:  Diagnosis Date  . Arthritis   . Blood transfusion without reported diagnosis   . Colon polyps 05/19/2012  . 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 DIABETES MELLITUS, TYPE II 08/25/2007  . Diastolic dysfunction    on echo  . Diverticulosis   . ERECTILE DYSFUNCTION 08/25/2007  . 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 Heart murmur    but was not stated by DR Hilty  . Hepatitis    took treatment  . HEPATITIS C 08/25/2007  . HYPERTENSION 08/25/2007  . 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 HYPOGONADISM 09/27/2009    Past Surgical History:  Procedure Laterality Date  . ABDOMINAL HERNIA REPAIR  2000  . ANTERIOR CERVICAL DECOMP/DISCECTOMY FUSION Right 03/04/2018   Procedure: Cervical Three-Four, Cervical Six-Seven Right Side approach, Anterior cervical decompression/discectomy/fusion;  Surgeon: Erline Levine, MD;  Location: [place];  Service: Neurosurgery;  Laterality: Right;  right side anterior approach  . CHOLECYSTECTOMY  1999  . COLONOSCOPY    . LUMBAR FUSION  2005  . TONSILLECTOMY AND ADENOIDECTOMY    . UPPER GASTROINTESTINAL ENDOSCOPY      Family History  Problem Relation Age of Onset  . Stroke Mother   . Diabetes Maternal Grandmother   . Cancer Neg Hx   . Colon cancer Neg Hx   . Esophageal cancer Neg Hx   .  Rectal cancer Neg Hx   . Stomach cancer Neg Hx     Social History   Socioeconomic History  . Marital status: Single    Spouse name: Not on file  . Number of children: 7  . Years of education: Not on file  . Highest education level: Not on file  Occupational History  . 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 Smoking status: Never Smoker  . Smokeless tobacco: Never Used  Substance and Sexual Activity  . Alcohol use: Not Currently    Comment: Quit 3 months ago in July 2020.  Marland Kitchen Drug use: No  . Sexual activity: Not on file  Other Topics Concern  . Not on file  Social History Narrative   Divorced   Social Determinants of Health   Financial Resource Strain: Not on file  Food Insecurity: Not on file  Transportation Needs: Not on file  Physical Activity: Not on file  Stress: Not on file  Social Connections: Not on file  Intimate Partner Violence: Not on file    Outpatient Medications Prior to Visit  Medication Sig Dispense Refill  . ACCU-CHEK AVIVA PLUS test strip USE AS INSTRUCTED ONCE DAILY 50 strip 7  . allopurinol (ZYLOPRIM) 100 MG tablet Take one tablet every  day. 90 tablet 3  . amLODipine-olmesartan (AZOR) 10-40 MG tablet TAKE  1 TABLET BY MOUTH EVERY DAY 90 tablet 1  . b complex vitamins capsule Take 1 capsule by mouth daily. 90 capsule 1  . cloNIDine (CATAPRES) 0.1 MG tablet TAKE 1 TABLET (0.1 MG TOTAL) BY MOUTH 2 (TWO) TIMES DAILY. 180 tablet 3  . colchicine 0.6 MG tablet 1 tablet every hour as needed for gout, not to exceed 6/day 6 tablet 4  . cyclobenzaprine (FLEXERIL) 10 MG tablet Take 1 tablet (10 mg total) by mouth 2 (two) times daily as needed for muscle spasms. 20 tablet 0  . JANUVIA 100 MG tablet TAKE 1 TABLET BY MOUTH EVERY DAY 90 tablet 3  . metFORMIN (GLUCOPHAGE) 1000 MG tablet TAKE 1 TABLET BY MOUTH 2 TIMES DAILY WITH A MEAL. 180 tablet 1  . Omega-3 Fatty Acids (FISH OIL) 1000 MG CAPS Take 2 capsules (2,000 mg total) by  mouth daily. 180 capsule 1  . oxyCODONE-acetaminophen (PERCOCET) 10-325 MG tablet Take 1 tablet by mouth 3 (three) times daily as needed for pain. 90 tablet 0  . repaglinide (PRANDIN) 1 MG tablet Take 1 tablet (1 mg total) by mouth 2 (two) times daily before a meal. 180 tablet 3  . traZODone (DESYREL) 100 MG tablet Take 1 tablet by mouth at bedtime.     No facility-administered medications prior to visit.    Allergies  Allergen Reactions  . Lyrica [Pregabalin] Other (See Comments)    Blurred vision  . Gabapentin Rash and Other (See Comments)    Diplopia (double vision)    ROS Review of Systems  Constitutional: Negative for diaphoresis, fatigue, fever and unexpected weight change.  Eyes: Negative for photophobia and visual disturbance.  Respiratory: Negative for chest tightness, shortness of breath and wheezing.   Cardiovascular: Positive for chest pain. Negative for palpitations and leg swelling.  Gastrointestinal: Negative for abdominal pain, anal bleeding, blood in stool, diarrhea, nausea and vomiting.  Genitourinary: Negative for difficulty urinating, frequency, hematuria and urgency.  Musculoskeletal: Positive for back pain.  Skin: Negative.   Psychiatric/Behavioral: Negative.       Objective:    Physical Exam Vitals and nursing note reviewed.  Constitutional:      General: He is not in acute distress.    Appearance: Normal appearance. He is not ill-appearing, toxic-appearing or diaphoretic.  HENT:     Head: Normocephalic and atraumatic.     Right Ear: Tympanic membrane, ear canal and external ear normal.     Left Ear: Tympanic membrane, ear canal and external ear normal.     Mouth/Throat:     Mouth: Mucous membranes are moist.     Pharynx: No oropharyngeal exudate or posterior oropharyngeal erythema.  Eyes:     General: No scleral icterus.       Right eye: No discharge.        Left eye: No discharge.     Extraocular Movements: Extraocular movements intact.      Conjunctiva/sclera: Conjunctivae normal.     Pupils: Pupils are equal, round, and reactive to light.  Neck:     Vascular: No carotid bruit.  Cardiovascular:     Rate and Rhythm: Normal rate and regular rhythm.  Pulmonary:     Effort: Pulmonary effort is normal.     Breath sounds: Examination of the left-lower field reveals decreased breath sounds. Decreased breath sounds present. No wheezing, rhonchi or rales.  Abdominal:     General: Bowel sounds are normal. There is no distension.     Palpations: Abdomen is soft. There is  no mass.     Tenderness: There is no abdominal tenderness. There is no right CVA tenderness, left CVA tenderness, guarding or rebound.     Hernia: A hernia is present. Hernia is present in the ventral area.  Musculoskeletal:     Cervical back: No rigidity or tenderness.  Lymphadenopathy:     Cervical: No cervical adenopathy.  Neurological:     Mental Status: He is alert and oriented to person, place, and time.  Psychiatric:        Mood and Affect: Mood normal.        Behavior: Behavior normal.     BP 134/80   Pulse 71   Temp 97.6 F (36.4 C) (Temporal)   Ht 6\' 3"  (1.905 m)   Wt 218 lb 3.2 oz (99 kg)   SpO2 95%   BMI 27.27 kg/m  Wt Readings from Last 3 Encounters:  04/11/21 218 lb 3.2 oz (99 kg)  02/07/21 223 lb (101.2 kg)  12/11/20 223 lb (101.2 kg)     Health Maintenance Due  Topic Date Due  . OPHTHALMOLOGY EXAM  08/31/2020    There are no preventive care reminders to display for this patient.  Lab Results  Component Value Date   TSH 3.38 09/15/2019   Lab Results  Component Value Date   WBC 6.7 02/07/2021   HGB 14.1 02/07/2021   HCT 40.9 02/07/2021   MCV 88.9 02/07/2021   PLT 204.0 02/07/2021   Lab Results  Component Value Date   NA 135 02/07/2021   K 4.0 02/07/2021   CO2 28 02/07/2021   GLUCOSE 266 (H) 02/07/2021   BUN 10 02/07/2021   CREATININE 0.94 02/07/2021   BILITOT 0.8 02/07/2021   ALKPHOS 42 02/07/2021   AST 21  02/07/2021   ALT 29 02/07/2021   PROT 7.3 02/07/2021   ALBUMIN 4.7 02/07/2021   CALCIUM 9.8 02/07/2021   ANIONGAP 14 02/24/2018   GFR 76.37 02/07/2021   Lab Results  Component Value Date   CHOL 194 04/25/2020   Lab Results  Component Value Date   HDL 38.90 (L) 04/25/2020   Lab Results  Component Value Date   LDLCALC 39 01/21/2018   Lab Results  Component Value Date   TRIG (H) 04/25/2020    432.0 Triglyceride is over 400; calculations on Lipids are invalid.   Lab Results  Component Value Date   CHOLHDL 5 04/25/2020   Lab Results  Component Value Date   HGBA1C 7.2 (A) 12/11/2020      Assessment & Plan:   Problem List Items Addressed This Visit      Other   Other chest pain - Primary   Relevant Orders   Amylase   Comprehensive metabolic panel   CBC   Lipase   Urinalysis, Routine w reflex microscopic   DG Chest 2 View (Completed)    Other Visit Diagnoses    Abdominal distension (gaseous)       Relevant Orders   CT Abdomen Pelvis Wo Contrast      No orders of the defined types were placed in this encounter.   Follow-up: Return in about 1 week (around 04/18/2021), or if symptoms worsen or fail to improve, for and to ER if worse. Libby Maw, MD

## 2021-04-11 NOTE — Addendum Note (Signed)
Addended by: Jon Billings on: 04/11/2021 01:17 PM   Modules accepted: Orders

## 2021-04-12 ENCOUNTER — Telehealth: Payer: Self-pay | Admitting: Family Medicine

## 2021-04-12 ENCOUNTER — Ambulatory Visit (HOSPITAL_BASED_OUTPATIENT_CLINIC_OR_DEPARTMENT_OTHER)
Admission: RE | Admit: 2021-04-12 | Discharge: 2021-04-12 | Disposition: A | Discharge status: Home / Self Care | Payer: Medicare Other | Source: Ambulatory Visit | Attending: Family Medicine | Admitting: Family Medicine

## 2021-04-12 DIAGNOSIS — R140 Abdominal distension (gaseous): Secondary | ICD-10-CM | POA: Insufficient documentation

## 2021-04-12 DIAGNOSIS — K7689 Other specified diseases of liver: Secondary | ICD-10-CM | POA: Diagnosis not present

## 2021-04-12 DIAGNOSIS — R161 Splenomegaly, not elsewhere classified: Secondary | ICD-10-CM | POA: Insufficient documentation

## 2021-04-12 DIAGNOSIS — K5730 Diverticulosis of large intestine without perforation or abscess without bleeding: Secondary | ICD-10-CM | POA: Diagnosis not present

## 2021-04-12 DIAGNOSIS — K7460 Unspecified cirrhosis of liver: Secondary | ICD-10-CM | POA: Insufficient documentation

## 2021-04-12 DIAGNOSIS — I700 Atherosclerosis of aorta: Secondary | ICD-10-CM | POA: Insufficient documentation

## 2021-04-12 DIAGNOSIS — K5750 Diverticulosis of both small and large intestine without perforation or abscess without bleeding: Secondary | ICD-10-CM | POA: Diagnosis not present

## 2021-04-12 DIAGNOSIS — J986 Disorders of diaphragm: Secondary | ICD-10-CM | POA: Diagnosis not present

## 2021-04-12 NOTE — Telephone Encounter (Signed)
Returned patient call duplicate in chart

## 2021-04-12 NOTE — Telephone Encounter (Signed)
Pt is wanting a cb concerning his most recent lab results and his CT Abdomen Pelvis Wo Contrast (Order [id number]) order that was placed. Please advise pt he stated he is still having pain.

## 2021-04-15 NOTE — Telephone Encounter (Signed)
Patient is calling to get test results. States he's still in a lot of pain. Please review results and give patient a call.

## 2021-04-16 ENCOUNTER — Emergency Department (HOSPITAL_COMMUNITY): Payer: Medicare Other

## 2021-04-16 ENCOUNTER — Emergency Department (HOSPITAL_COMMUNITY)
Admission: EM | Admit: 2021-04-16 | Discharge: 2021-04-16 | Disposition: A | Discharge status: Home / Self Care | Payer: Medicare Other | Attending: Emergency Medicine | Admitting: Emergency Medicine

## 2021-04-16 ENCOUNTER — Other Ambulatory Visit: Payer: Self-pay

## 2021-04-16 ENCOUNTER — Encounter (HOSPITAL_COMMUNITY): Payer: Self-pay

## 2021-04-16 DIAGNOSIS — N1832 Chronic kidney disease, stage 3b: Secondary | ICD-10-CM | POA: Insufficient documentation

## 2021-04-16 DIAGNOSIS — J986 Disorders of diaphragm: Secondary | ICD-10-CM | POA: Diagnosis not present

## 2021-04-16 DIAGNOSIS — I129 Hypertensive chronic kidney disease with stage 1 through stage 4 chronic kidney disease, or unspecified chronic kidney disease: Secondary | ICD-10-CM | POA: Insufficient documentation

## 2021-04-16 DIAGNOSIS — I700 Atherosclerosis of aorta: Secondary | ICD-10-CM | POA: Diagnosis not present

## 2021-04-16 DIAGNOSIS — E1122 Type 2 diabetes mellitus with diabetic chronic kidney disease: Secondary | ICD-10-CM | POA: Insufficient documentation

## 2021-04-16 DIAGNOSIS — Z7984 Long term (current) use of oral hypoglycemic drugs: Secondary | ICD-10-CM | POA: Diagnosis not present

## 2021-04-16 DIAGNOSIS — K5750 Diverticulosis of both small and large intestine without perforation or abscess without bleeding: Secondary | ICD-10-CM | POA: Diagnosis not present

## 2021-04-16 DIAGNOSIS — N281 Cyst of kidney, acquired: Secondary | ICD-10-CM | POA: Diagnosis not present

## 2021-04-16 DIAGNOSIS — Z79899 Other long term (current) drug therapy: Secondary | ICD-10-CM | POA: Diagnosis not present

## 2021-04-16 DIAGNOSIS — R109 Unspecified abdominal pain: Secondary | ICD-10-CM | POA: Diagnosis not present

## 2021-04-16 LAB — URINALYSIS, ROUTINE W REFLEX MICROSCOPIC
Bilirubin Urine: NEGATIVE
Glucose, UA: NEGATIVE mg/dL
Hgb urine dipstick: NEGATIVE
Ketones, ur: NEGATIVE mg/dL
Leukocytes,Ua: NEGATIVE
Nitrite: NEGATIVE
Protein, ur: NEGATIVE mg/dL
Specific Gravity, Urine: 1.012 (ref 1.005–1.030)
pH: 5 (ref 5.0–8.0)

## 2021-04-16 LAB — CBC WITH DIFFERENTIAL/PLATELET
Abs Immature Granulocytes: 0.11 10*3/uL — ABNORMAL HIGH (ref 0.00–0.07)
Basophils Absolute: 0.1 10*3/uL (ref 0.0–0.1)
Basophils Relative: 1 %
Eosinophils Absolute: 0.1 10*3/uL (ref 0.0–0.5)
Eosinophils Relative: 2 %
HCT: 40.9 % (ref 39.0–52.0)
Hemoglobin: 13.9 g/dL (ref 13.0–17.0)
Immature Granulocytes: 2 %
Lymphocytes Relative: 24 %
Lymphs Abs: 1.6 10*3/uL (ref 0.7–4.0)
MCH: 30.9 pg (ref 26.0–34.0)
MCHC: 34 g/dL (ref 30.0–36.0)
MCV: 90.9 fL (ref 80.0–100.0)
Monocytes Absolute: 0.5 10*3/uL (ref 0.1–1.0)
Monocytes Relative: 7 %
Neutro Abs: 4.4 10*3/uL (ref 1.7–7.7)
Neutrophils Relative %: 64 %
Platelets: 195 10*3/uL (ref 150–400)
RBC: 4.5 MIL/uL (ref 4.22–5.81)
RDW: 12.4 % (ref 11.5–15.5)
WBC: 6.8 10*3/uL (ref 4.0–10.5)
nRBC: 0 % (ref 0.0–0.2)

## 2021-04-16 LAB — COMPREHENSIVE METABOLIC PANEL
ALT: 32 U/L (ref 0–44)
AST: 24 U/L (ref 15–41)
Albumin: 4.6 g/dL (ref 3.5–5.0)
Alkaline Phosphatase: 39 U/L (ref 38–126)
Anion gap: 7 (ref 5–15)
BUN: 17 mg/dL (ref 8–23)
CO2: 27 mmol/L (ref 22–32)
Calcium: 9.2 mg/dL (ref 8.9–10.3)
Chloride: 104 mmol/L (ref 98–111)
Creatinine, Ser: 0.85 mg/dL (ref 0.61–1.24)
GFR, Estimated: >60 mL/min (ref >60)
Glucose, Bld: 223 mg/dL — ABNORMAL HIGH (ref 70–99)
Potassium: 3.7 mmol/L (ref 3.5–5.1)
Sodium: 138 mmol/L (ref 135–145)
Total Bilirubin: 0.8 mg/dL (ref 0.3–1.2)
Total Protein: 7.3 g/dL (ref 6.5–8.1)

## 2021-04-16 MED ORDER — KETOROLAC TROMETHAMINE 15 MG/ML IJ SOLN
15.0000 mg | Freq: Once | INTRAMUSCULAR | Status: AC
  Administered 2021-04-16: 15 mg via INTRAVENOUS
  Filled 2021-04-16: qty 1

## 2021-04-16 MED ORDER — METHOCARBAMOL 500 MG PO TABS: 500.0000 mg | ORAL_TABLET | Freq: Three times a day (TID) | ORAL | 0 refills | Status: DC | PRN

## 2021-04-16 MED ORDER — OXYCODONE-ACETAMINOPHEN 5-325 MG PO TABS
1.0000 | ORAL_TABLET | Freq: Once | ORAL | Status: AC
Start: 2021-04-16 — End: 2021-04-16
  Administered 2021-04-16: 1 via ORAL
  Filled 2021-04-16: qty 1

## 2021-04-16 NOTE — Telephone Encounter (Signed)
Returned patients call no answer LMTCB. Patient does have an upcoming appointment schedule for this week. Called to see if appointment is for call concerns.

## 2021-04-16 NOTE — ED Triage Notes (Signed)
Patient c/o intermittent left flank pain x 1 month. Patient denies any hematuria, frequency or dysuria.

## 2021-04-16 NOTE — Discharge Instructions (Signed)
Follow-up with your primary doctor.  Recommend Tylenol or Motrin as needed for pain control.  Also try the muscle relaxer as needed.  Return to ER if you develop uncontrolled pain, vomiting, fever or other new concerning symptom.

## 2021-04-16 NOTE — ED Notes (Signed)
Clear yellow urine obtained- sent to lab

## 2021-04-16 NOTE — ED Provider Notes (Signed)
[facility] DEPT Provider Note   CSN: [id number] Arrival date & time: 04/16/21  0859     History Chief Complaint  Patient presents with  . Flank Pain    NOX TALENT is a 80 y.o. male.  Presents to ER with concern for flank pain.  Patient reports that symptoms ongoing for the last few weeks.  Predominantly left-sided, comes and goes, no particular pattern.  Currently moderate in severity.  Isolated to the left side.  He denies any pain in his chest, no difficulty in breathing.  Has tried over-the-counter meds with minimal relief.  Went to primary care office a few days ago and had outpatient labs, CT imaging which were grossly normal.  Denies blood in urine, dysuria.  No nausea vomiting or diarrhea.  No fever.  HPI     Past Medical History:  Diagnosis Date  . Arthritis   . Blood transfusion without reported diagnosis   . Colon polyps 05/19/2012  . 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 DIABETES MELLITUS, TYPE II 08/25/2007  . Diastolic dysfunction    on echo  . Diverticulosis   . ERECTILE DYSFUNCTION 08/25/2007  . 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 Heart murmur    but was not stated by DR Hilty  . Hepatitis    took treatment  . HEPATITIS C 08/25/2007  . HYPERTENSION 08/25/2007  . 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 HYPOGONADISM 09/27/2009    Patient Active Problem List   Diagnosis Date Noted  . Other chest pain 04/11/2021  . CKD (chronic kidney disease) stage 2, GFR 60-89 ml/min 12/16/2019  . B12 deficiency 12/16/2019  . Iron overload 12/16/2019  . Stage 3b chronic kidney disease ([place]) 10/18/2019  . Macrocytosis 10/18/2019  . Anemia 09/19/2019  . Need for influenza vaccination 09/19/2019  . Breast mass 09/15/2019  . Breast tenderness in male 09/15/2019  . 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 OA (osteoarthritis) of knee 06/17/2019  . History of hepatitis C 06/14/2019  . Elevated cholesterol  06/14/2019  . 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 Labral tear of shoulder, degenerative, left 08/23/2018  . Contracture of joint of left shoulder region 08/23/2018  . 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/23/2018  . Chronic pain syndrome 08/23/2018  . Herniated cervical intervertebral disc 03/04/2018  . Narcotic drug use 11/23/2017  . Dyslipidemia 10/01/2017  . 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/01/2017  . Atrial fibrillation ([place]) 06/08/2017  . Thrombocytopenia ([place]) 10/15/2016  . 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 Nocturia 08/09/2014  . Hematuria 08/09/2014  . Other dysphagia 05/18/2014  . Personal history of colonic polyps 07/13/2013  . Nonspecific abnormal electrocardiogram (ECG) (EKG) 04/20/2013  . Encounter for long-term (current) use of other medications 04/15/2012  . Healthcare maintenance 04/15/2012  . Radiculopathy of leg 10/08/2011  . Blurry vision 10/08/2011  . Special screening, prostate cancer 04/09/2011  . Lumbago 04/09/2011  . OTHER SPEC HYPERTROPHIC&ATROPHIC CONDITION SKIN 02/06/2010  . FOOT PAIN, LEFT 02/06/2010  . HYPOGONADISM 09/27/2009  . 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 FLANK PAIN, RIGHT 07/17/2009  . 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  . Acute hepatitis C virus infection 08/25/2007  . Diabetes ([place]) 08/25/2007  . 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  . ERECTILE DYSFUNCTION 08/25/2007  . Essential hypertension 08/25/2007    Past Surgical History:  Procedure Laterality Date  . ABDOMINAL HERNIA REPAIR  2000  . ANTERIOR CERVICAL DECOMP/DISCECTOMY FUSION Right 03/04/2018   Procedure: Cervical Three-Four, Cervical Six-Seven Right Side approach, Anterior  cervical decompression/discectomy/fusion;  Surgeon: Erline Levine, MD;  Location: [facility];  Service: Neurosurgery;  Laterality: Right;  right side anterior approach  . CHOLECYSTECTOMY  1999  . COLONOSCOPY    . LUMBAR FUSION  2005  . TONSILLECTOMY AND ADENOIDECTOMY    . UPPER GASTROINTESTINAL  ENDOSCOPY         Family History  Problem Relation Age of Onset  . Stroke Mother   . Diabetes Maternal Grandmother   . Cancer Neg Hx   . Colon cancer Neg Hx   . Esophageal cancer Neg Hx   . Rectal cancer Neg Hx   . Stomach cancer Neg Hx     Social History   Tobacco Use  . Smoking status: Never Smoker  . Smokeless tobacco: Never Used  Vaping Use  . Vaping Use: Never used  Substance Use Topics  . Alcohol use: Not Currently    Comment: Quit 3 months ago in July 2020.  Marland Kitchen Drug use: No    Home Medications Prior to Admission medications   Medication Sig Start Date End Date Taking? Authorizing Provider  methocarbamol (ROBAXIN) 500 MG tablet Take 1 tablet (500 mg total) by mouth every 8 (eight) hours as needed for muscle spasms. 04/16/21  Yes Lucrezia Starch, MD  ACCU-CHEK AVIVA PLUS test strip USE AS INSTRUCTED ONCE DAILY 09/15/20   Renato Shin, MD  allopurinol (ZYLOPRIM) 100 MG tablet Take one tablet every  day. 04/25/20   Libby Maw, MD  amLODipine-olmesartan (AZOR) 10-40 MG tablet TAKE 1 TABLET BY MOUTH EVERY DAY 10/23/20   Dutch Quint B, FNP  b complex vitamins capsule Take 1 capsule by mouth daily. 04/25/20   Libby Maw, MD  cloNIDine (CATAPRES) 0.1 MG tablet TAKE 1 TABLET (0.1 MG TOTAL) BY MOUTH 2 (TWO) TIMES DAILY. 05/14/20   Libby Maw, MD  colchicine 0.6 MG tablet 1 tablet every hour as needed for gout, not to exceed 6/day 09/01/18   Renato Shin, MD  cyclobenzaprine (FLEXERIL) 10 MG tablet Take 1 tablet (10 mg total) by mouth 2 (two) times daily as needed for muscle spasms. 06/16/20   Pattricia Boss, MD  JANUVIA 100 MG tablet TAKE 1 TABLET BY MOUTH EVERY DAY 07/18/20   Renato Shin, MD  metFORMIN (GLUCOPHAGE) 1000 MG tablet TAKE 1 TABLET BY MOUTH 2 TIMES DAILY WITH A MEAL. 02/17/21   Renato Shin, MD  Omega-3 Fatty Acids (FISH OIL) 1000 MG CAPS Take 2 capsules (2,000 mg total) by mouth daily. 04/25/20   Libby Maw, MD   oxyCODONE-acetaminophen (PERCOCET) 10-325 MG tablet Take 1 tablet by mouth 3 (three) times daily as needed for pain. 10/25/19   Bayard Hugger, NP  repaglinide (PRANDIN) 1 MG tablet Take 1 tablet (1 mg total) by mouth 2 (two) times daily before a meal. 12/11/20   Renato Shin, MD  traZODone (DESYREL) 100 MG tablet Take 1 tablet by mouth at bedtime. 01/25/19   [provider]    Allergies    Lyrica [pregabalin] and Gabapentin  Review of Systems   Review of Systems  Constitutional: Negative for chills and fever.  HENT: Negative for ear pain and sore throat.   Eyes: Negative for pain and visual disturbance.  Respiratory: Negative for cough and shortness of breath.   Cardiovascular: Negative for chest pain and palpitations.  Gastrointestinal: Negative for abdominal pain and vomiting.  Genitourinary: Positive for flank pain. Negative for dysuria and hematuria.  Musculoskeletal: Negative for arthralgias and back pain.  Skin:  Negative for color change and rash.  Neurological: Negative for seizures and syncope.  All other systems reviewed and are negative.   Physical Exam Updated Vital Signs BP (!) 146/83   Pulse (!) 59   Temp 98.5 F (36.9 C) (Oral)   Resp 12   Ht 6\' 3"  (1.905 m)   Wt 98.9 kg   SpO2 91%   BMI 27.25 kg/m   Physical Exam Vitals and nursing note reviewed.  Constitutional:      Appearance: He is well-developed.  HENT:     Head: Normocephalic and atraumatic.  Eyes:     Conjunctiva/sclera: Conjunctivae normal.  Cardiovascular:     Rate and Rhythm: Normal rate and regular rhythm.     Heart sounds: No murmur heard.   Pulmonary:     Effort: Pulmonary effort is normal. No respiratory distress.     Breath sounds: Normal breath sounds.  Abdominal:     Palpations: Abdomen is soft.     Tenderness: There is no abdominal tenderness.     Comments: Some tenderness to palpation over left flank  Musculoskeletal:     Cervical back: Neck supple.     Comments:  Some tenderness on left flank  Skin:    General: Skin is warm and dry.  Neurological:     Mental Status: He is alert.     ED Results / Procedures / Treatments   Labs (all labs ordered are listed, but only abnormal results are displayed) Labs Reviewed  CBC WITH DIFFERENTIAL/PLATELET - Abnormal; Notable for the following components:      Result Value   Abs Immature Granulocytes 0.11 (*)    All other components within normal limits  COMPREHENSIVE METABOLIC PANEL - Abnormal; Notable for the following components:   Glucose, Bld 223 (*)    All other components within normal limits  URINALYSIS, ROUTINE W REFLEX MICROSCOPIC    EKG None  Radiology CT Renal Stone Study  Result Date: 04/16/2021 CLINICAL DATA:  Left flank pain EXAM: CT ABDOMEN AND PELVIS WITHOUT CONTRAST TECHNIQUE: Multidetector CT imaging of the abdomen and pelvis was performed following the standard protocol without IV contrast. COMPARISON:  04/12/2021 FINDINGS: Lower chest: Elevation of the left hemidiaphragm, stable. No acute findings Hepatobiliary: No focal liver abnormality is seen. Status post cholecystectomy. No biliary dilatation. Pancreas: No focal abnormality or ductal dilatation. Spleen: No focal abnormality.  Normal size. Adrenals/Urinary Tract: Exophytic cyst off the midpole of the left kidney measures 2.1 cm, stable. No renal or ureteral stone. No hydronephrosis. Adrenal glands and urinary bladder unremarkable. Stomach/Bowel: Sigmoid diverticulosis. No active diverticulitis. Stomach and small bowel decompressed, unremarkable. Moderate stool burden throughout the colon. Vascular/Lymphatic: Aortic atherosclerosis. No evidence of aneurysm or adenopathy. Reproductive: No visible focal abnormality. Other: No free fluid or free air. Musculoskeletal: No acute bony abnormality. IMPRESSION: No renal or ureteral stones.  No hydronephrosis. No acute findings. No change since recent study. Electronically Signed   By: Rolm Baptise  M.D.   On: 04/16/2021 11:52    Procedures Procedures   Medications Ordered in ED Medications  ketorolac (TORADOL) 15 MG/ML injection 15 mg (15 mg Intravenous Given 04/16/21 1133)  oxyCODONE-acetaminophen (PERCOCET/ROXICET) 5-325 MG per tablet 1 tablet (1 tablet Oral Given 04/16/21 1133)    ED Course  I have reviewed the triage vital signs and the nursing notes.  Pertinent labs & imaging results that were available during my care of the patient were reviewed by me and considered in my medical decision making (see chart  for details).    MDM Rules/Calculators/A&P                         80 year old male presenting to ER with concern for left flank pain.  On exam, patient well-appearing in no distress.  Noted to have some tenderness of the left flank but otherwise benign exam.  His basic labs were stable.  UA negative for infection.  CT abdomen pelvis negative for acute pathology.  Given reassuring work-up, believe patient can be discharged and managed in the outpatient setting.  Suspect most likely MSK in etiology.  Recommended follow-up with primary doctor.  After the discussed management above, the patient was determined to be safe for discharge.  The patient was in agreement with this plan and all questions regarding their care were answered.  ED return precautions were discussed and the patient will return to the ED with any significant worsening of condition.   Final Clinical Impression(s) / ED Diagnoses Final diagnoses:  Left flank pain    Rx / DC Orders ED Discharge Orders         Ordered    methocarbamol (ROBAXIN) 500 MG tablet  Every 8 hours PRN        04/16/21 1301           Lucrezia Starch, MD 04/16/21 1313

## 2021-04-17 ENCOUNTER — Other Ambulatory Visit: Payer: Self-pay | Admitting: Family

## 2021-04-17 DIAGNOSIS — I10 Essential (primary) hypertension: Secondary | ICD-10-CM

## 2021-04-18 ENCOUNTER — Ambulatory Visit (INDEPENDENT_AMBULATORY_CARE_PROVIDER_SITE_OTHER): Payer: Medicare Other | Admitting: Family Medicine

## 2021-04-18 ENCOUNTER — Encounter: Payer: Self-pay | Admitting: Family Medicine

## 2021-04-18 ENCOUNTER — Ambulatory Visit (INDEPENDENT_AMBULATORY_CARE_PROVIDER_SITE_OTHER): Payer: Medicare Other

## 2021-04-18 ENCOUNTER — Other Ambulatory Visit: Payer: Self-pay

## 2021-04-18 VITALS — BP 120/70 | HR 73 | Temp 98.1°F | Ht 75.0 in | Wt 215.0 lb

## 2021-04-18 DIAGNOSIS — R109 Unspecified abdominal pain: Secondary | ICD-10-CM

## 2021-04-18 DIAGNOSIS — J986 Disorders of diaphragm: Secondary | ICD-10-CM | POA: Diagnosis not present

## 2021-04-18 DIAGNOSIS — E782 Mixed hyperlipidemia: Secondary | ICD-10-CM

## 2021-04-18 DIAGNOSIS — Z79899 Other long term (current) drug therapy: Secondary | ICD-10-CM | POA: Diagnosis not present

## 2021-04-18 DIAGNOSIS — M545 Low back pain, unspecified: Secondary | ICD-10-CM | POA: Diagnosis not present

## 2021-04-18 DIAGNOSIS — Z79891 Long term (current) use of opiate analgesic: Secondary | ICD-10-CM | POA: Diagnosis not present

## 2021-04-18 MED ORDER — PREDNISONE 10 MG (48) PO TBPK: ORAL_TABLET | ORAL | 0 refills | Status: DC

## 2021-04-18 NOTE — Progress Notes (Signed)
Established Patient Office Visit  Subjective:  Patient ID: Larry Robertson, male    DOB: November 01, 1941  Age: 80 y.o. MRN: [id number]  CC:  Chief Complaint  Patient presents with  . Follow-up    Follow up on left flank pain, per patient no improvement. Seen at hospital 2 days ago.     HPI [name] presents for left posterior side pain.  Pain persist.  Status post 2 negative CT scans.  There was no injury.  Elevated triglycerides at last check.  Past Medical History:  Diagnosis Date  . Arthritis   . Blood transfusion without reported diagnosis   . Colon polyps 05/19/2012  . 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 DIABETES MELLITUS, TYPE II 08/25/2007  . Diastolic dysfunction    on echo  . Diverticulosis   . ERECTILE DYSFUNCTION 08/25/2007  . 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 Heart murmur    but was not stated by DR Hilty  . Hepatitis    took treatment  . HEPATITIS C 08/25/2007  . HYPERTENSION 08/25/2007  . 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 HYPOGONADISM 09/27/2009    Past Surgical History:  Procedure Laterality Date  . ABDOMINAL HERNIA REPAIR  2000  . ANTERIOR CERVICAL DECOMP/DISCECTOMY FUSION Right 03/04/2018   Procedure: Cervical Three-Four, Cervical Six-Seven Right Side approach, Anterior cervical decompression/discectomy/fusion;  Surgeon: Erline Levine, MD;  Location: [facility];  Service: Neurosurgery;  Laterality: Right;  right side anterior approach  . CHOLECYSTECTOMY  1999  . COLONOSCOPY    . LUMBAR FUSION  2005  . TONSILLECTOMY AND ADENOIDECTOMY    . UPPER GASTROINTESTINAL ENDOSCOPY      Family History  Problem Relation Age of Onset  . Stroke Mother   . Diabetes Maternal Grandmother   . Cancer Neg Hx   . Colon cancer Neg Hx   . Esophageal cancer Neg Hx   . Rectal cancer Neg Hx   . Stomach cancer Neg Hx     Social History   Socioeconomic History  . Marital status: Single    Spouse name: Not on file  . Number of children: 7  .  Years of education: Not on file  . Highest education level: Not on file  Occupational History  . 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 Smoking status: Never Smoker  . Smokeless tobacco: Never Used  Vaping Use  . Vaping Use: Never used  Substance and Sexual Activity  . Alcohol use: Not Currently    Comment: Quit 3 months ago in July 2020.  Marland Kitchen Drug use: No  . Sexual activity: Not on file  Other Topics Concern  . Not on file  Social History Narrative   Divorced   Social Determinants of Health   Financial Resource Strain: Not on file  Food Insecurity: Not on file  Transportation Needs: Not on file  Physical Activity: Not on file  Stress: Not on file  Social Connections: Not on file  Intimate Partner Violence: Not on file    Outpatient Medications Prior to Visit  Medication Sig Dispense Refill  . ACCU-CHEK AVIVA PLUS test strip USE AS INSTRUCTED ONCE DAILY 50 strip 7  . allopurinol (ZYLOPRIM) 100 MG tablet Take one tablet every  day. 90 tablet 3  . amLODipine-olmesartan (AZOR) 10-40 MG tablet TAKE 1 TABLET BY MOUTH EVERY DAY 90 tablet 1  . b complex vitamins capsule Take 1 capsule by mouth daily. 90 capsule 1  .  cloNIDine (CATAPRES) 0.1 MG tablet TAKE 1 TABLET (0.1 MG TOTAL) BY MOUTH 2 (TWO) TIMES DAILY. 180 tablet 3  . colchicine 0.6 MG tablet 1 tablet every hour as needed for gout, not to exceed 6/day 6 tablet 4  . cyclobenzaprine (FLEXERIL) 10 MG tablet Take 1 tablet (10 mg total) by mouth 2 (two) times daily as needed for muscle spasms. 20 tablet 0  . JANUVIA 100 MG tablet TAKE 1 TABLET BY MOUTH EVERY DAY 90 tablet 3  . metFORMIN (GLUCOPHAGE) 1000 MG tablet TAKE 1 TABLET BY MOUTH 2 TIMES DAILY WITH A MEAL. 180 tablet 1  . Omega-3 Fatty Acids (FISH OIL) 1000 MG CAPS Take 2 capsules (2,000 mg total) by mouth daily. 180 capsule 1  . oxyCODONE-acetaminophen (PERCOCET) 10-325 MG tablet Take 1 tablet by mouth 3 (three) times daily as needed for  pain. 90 tablet 0  . repaglinide (PRANDIN) 1 MG tablet Take 1 tablet (1 mg total) by mouth 2 (two) times daily before a meal. 180 tablet 3  . traZODone (DESYREL) 100 MG tablet Take 1 tablet by mouth at bedtime.    . methocarbamol (ROBAXIN) 500 MG tablet Take 1 tablet (500 mg total) by mouth every 8 (eight) hours as needed for muscle spasms. (Patient not taking: Reported on 04/18/2021) 20 tablet 0   No facility-administered medications prior to visit.    Allergies  Allergen Reactions  . Lyrica [Pregabalin] Other (See Comments)    Blurred vision  . Gabapentin Rash and Other (See Comments)    Diplopia (double vision)    ROS Review of Systems  Constitutional: Negative.   Respiratory: Negative.   Cardiovascular: Negative.   Gastrointestinal: Negative.   Genitourinary: Negative.   Psychiatric/Behavioral: Negative.       Objective:    Physical Exam Vitals and nursing note reviewed.  Constitutional:      General: He is not in acute distress.    Appearance: Normal appearance. He is normal weight. He is not ill-appearing or toxic-appearing.  Eyes:     General: No scleral icterus.       Right eye: No discharge.        Left eye: No discharge.     Conjunctiva/sclera: Conjunctivae normal.  Pulmonary:     Effort: Pulmonary effort is normal.  Skin:    General: Skin is warm and dry.  Neurological:     Mental Status: He is alert and oriented to person, place, and time.  Psychiatric:        Mood and Affect: Mood normal.        Behavior: Behavior normal.     BP 120/70   Pulse 73   Temp 98.1 F (36.7 C) (Temporal)   Ht 6\' 3"  (1.905 m)   Wt 215 lb (97.5 kg)   SpO2 96%   BMI 26.87 kg/m  Wt Readings from Last 3 Encounters:  04/18/21 215 lb (97.5 kg)  04/16/21 218 lb (98.9 kg)  04/11/21 218 lb 3.2 oz (99 kg)     Health Maintenance Due  Topic Date Due  . OPHTHALMOLOGY EXAM  08/31/2020    There are no preventive care reminders to display for this patient.  Lab Results   Component Value Date   TSH 3.38 09/15/2019   Lab Results  Component Value Date   WBC 6.8 04/16/2021   HGB 13.9 04/16/2021   HCT 40.9 04/16/2021   MCV 90.9 04/16/2021   PLT 195 04/16/2021   Lab Results  Component Value Date  NA 138 04/16/2021   K 3.7 04/16/2021   CO2 27 04/16/2021   GLUCOSE 223 (H) 04/16/2021   BUN 17 04/16/2021   CREATININE 0.85 04/16/2021   BILITOT 0.8 04/16/2021   ALKPHOS 39 04/16/2021   AST 24 04/16/2021   ALT 32 04/16/2021   PROT 7.3 04/16/2021   ALBUMIN 4.6 04/16/2021   CALCIUM 9.2 04/16/2021   ANIONGAP 7 04/16/2021   GFR 67.56 04/11/2021   Lab Results  Component Value Date   CHOL 194 04/25/2020   Lab Results  Component Value Date   HDL 38.90 (L) 04/25/2020   Lab Results  Component Value Date   LDLCALC 39 01/21/2018   Lab Results  Component Value Date   TRIG (H) 04/25/2020    432.0 Triglyceride is over 400; calculations on Lipids are invalid.   Lab Results  Component Value Date   CHOLHDL 5 04/25/2020   Lab Results  Component Value Date   HGBA1C 7.2 (A) 12/11/2020      Assessment & Plan:   Problem List Items Addressed This Visit      Other   Left sided abdominal pain of unknown cause - Primary   Relevant Medications   predniSONE (STERAPRED UNI-PAK 48 TAB) 10 MG (48) TBPK tablet   Other Relevant Orders   DG Ribs Unilateral Left   Elevated triglycerides with high cholesterol   Relevant Orders   Lipid panel      Meds ordered this encounter  Medications  . predniSONE (STERAPRED UNI-PAK 48 TAB) 10 MG (48) TBPK tablet    Sig: Please instruct a 12 day dose pack.    Dispense:  48 tablet    Refill:  0    Follow-up: Return if symptoms worsen or fail to improve.  Discussed prednisone and its side effects to include increased appetite and thus elevated blood sugar, irritability..  Patient will consider purchasing a rib binder.  Will return fasting for lipid profile.  Libby Maw, MD

## 2021-04-22 NOTE — Telephone Encounter (Signed)
Please see refill request.

## 2021-05-04 ENCOUNTER — Other Ambulatory Visit: Payer: Self-pay | Admitting: Family Medicine

## 2021-05-04 DIAGNOSIS — I10 Essential (primary) hypertension: Secondary | ICD-10-CM

## 2021-05-04 DIAGNOSIS — M109 Gout, unspecified: Secondary | ICD-10-CM

## 2021-05-08 DIAGNOSIS — I10 Essential (primary) hypertension: Secondary | ICD-10-CM | POA: Diagnosis not present

## 2021-05-08 DIAGNOSIS — M5414 Radiculopathy, thoracic region: Secondary | ICD-10-CM | POA: Diagnosis not present

## 2021-05-08 DIAGNOSIS — M4120 Other idiopathic scoliosis, site unspecified: Secondary | ICD-10-CM | POA: Diagnosis not present

## 2021-05-08 DIAGNOSIS — M546 Pain in thoracic spine: Secondary | ICD-10-CM | POA: Diagnosis not present

## 2021-05-08 DIAGNOSIS — Q791 Other congenital malformations of diaphragm: Secondary | ICD-10-CM | POA: Diagnosis not present

## 2021-05-08 DIAGNOSIS — M5416 Radiculopathy, lumbar region: Secondary | ICD-10-CM | POA: Diagnosis not present

## 2021-05-08 DIAGNOSIS — R161 Splenomegaly, not elsewhere classified: Secondary | ICD-10-CM | POA: Diagnosis not present

## 2021-05-13 DIAGNOSIS — M7500 Adhesive capsulitis of unspecified shoulder: Secondary | ICD-10-CM | POA: Diagnosis not present

## 2021-05-13 DIAGNOSIS — Z981 Arthrodesis status: Secondary | ICD-10-CM | POA: Diagnosis not present

## 2021-05-13 DIAGNOSIS — Z79899 Other long term (current) drug therapy: Secondary | ICD-10-CM | POA: Diagnosis not present

## 2021-05-13 DIAGNOSIS — M25561 Pain in right knee: Secondary | ICD-10-CM | POA: Diagnosis not present

## 2021-05-13 DIAGNOSIS — Z79891 Long term (current) use of opiate analgesic: Secondary | ICD-10-CM | POA: Diagnosis not present

## 2021-05-13 DIAGNOSIS — G894 Chronic pain syndrome: Secondary | ICD-10-CM | POA: Diagnosis not present

## 2021-05-15 DIAGNOSIS — M48061 Spinal stenosis, lumbar region without neurogenic claudication: Secondary | ICD-10-CM | POA: Diagnosis not present

## 2021-05-15 DIAGNOSIS — M4804 Spinal stenosis, thoracic region: Secondary | ICD-10-CM | POA: Diagnosis not present

## 2021-05-15 DIAGNOSIS — M4726 Other spondylosis with radiculopathy, lumbar region: Secondary | ICD-10-CM | POA: Diagnosis not present

## 2021-05-15 DIAGNOSIS — M5116 Intervertebral disc disorders with radiculopathy, lumbar region: Secondary | ICD-10-CM | POA: Diagnosis not present

## 2021-05-15 DIAGNOSIS — M5416 Radiculopathy, lumbar region: Secondary | ICD-10-CM | POA: Diagnosis not present

## 2021-05-15 DIAGNOSIS — M4724 Other spondylosis with radiculopathy, thoracic region: Secondary | ICD-10-CM | POA: Diagnosis not present

## 2021-05-15 DIAGNOSIS — M5414 Radiculopathy, thoracic region: Secondary | ICD-10-CM | POA: Diagnosis not present

## 2021-05-27 ENCOUNTER — Other Ambulatory Visit: Payer: Self-pay | Admitting: Neurosurgery

## 2021-05-27 DIAGNOSIS — M546 Pain in thoracic spine: Secondary | ICD-10-CM | POA: Diagnosis not present

## 2021-05-27 DIAGNOSIS — M5416 Radiculopathy, lumbar region: Secondary | ICD-10-CM | POA: Diagnosis not present

## 2021-05-27 DIAGNOSIS — M5414 Radiculopathy, thoracic region: Secondary | ICD-10-CM | POA: Diagnosis not present

## 2021-05-27 DIAGNOSIS — M5124 Other intervertebral disc displacement, thoracic region: Secondary | ICD-10-CM | POA: Diagnosis not present

## 2021-06-04 ENCOUNTER — Ambulatory Visit (INDEPENDENT_AMBULATORY_CARE_PROVIDER_SITE_OTHER): Payer: Medicare Other | Admitting: Endocrinology

## 2021-06-04 ENCOUNTER — Other Ambulatory Visit: Payer: Self-pay

## 2021-06-04 VITALS — BP 138/80 | HR 72 | Ht 75.0 in | Wt 215.6 lb

## 2021-06-04 DIAGNOSIS — E119 Type 2 diabetes mellitus without complications: Secondary | ICD-10-CM

## 2021-06-04 LAB — POCT GLYCOSYLATED HEMOGLOBIN (HGB A1C): Hemoglobin A1C: 8.3 % — AB (ref 4.0–5.6)

## 2021-06-04 MED ORDER — REPAGLINIDE 2 MG PO TABS: 2.0000 mg | ORAL_TABLET | Freq: Two times a day (BID) | ORAL | 3 refills | Status: DC

## 2021-06-04 NOTE — Progress Notes (Signed)
Subjective:    Patient ID: Larry Robertson, male    DOB: 05-30-1941, 80 y.o.   MRN: [id number]  HPI Pt returns for f/u of diabetes mellitus:  DM type: 2 Dx'ed: 2010.  Complications: CRI Therapy: 3 oral meds.  DKA: never.  Severe hypoglycemia: never.  Pancreatitis: never.  Other: edema and nocturia (he no longer takes d-DAVP), limit oral rx options; he has never taken insulin.   Interval history: no cbg record, but states cbg's are in the mid-100's  he takes meds as rx'ed.  He got steroid pack 6 weeks ago, for back pain.   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   Heart murmur    but was not stated by DR Hilty   Hepatitis    took treatment   HEPATITIS C 08/25/2007   HYPERTENSION 08/25/2007   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   no per pt   HYPOGONADISM 09/27/2009    Past Surgical History:  Procedure Laterality Date   ABDOMINAL HERNIA REPAIR  2000   ANTERIOR CERVICAL DECOMP/DISCECTOMY FUSION Right 03/04/2018   Procedure: Cervical Three-Four, Cervical Six-Seven Right Side approach, Anterior cervical decompression/discectomy/fusion;  Surgeon: Erline Levine, MD;  Location: [facility];  Service: Neurosurgery;  Laterality: Right;  right side anterior approach   CHOLECYSTECTOMY  1999   COLONOSCOPY     LUMBAR FUSION  2005   TONSILLECTOMY AND ADENOIDECTOMY     UPPER GASTROINTESTINAL ENDOSCOPY      Social History   Socioeconomic History   Marital status: Single    Spouse name: Not on file   Number of children: 7   Years of education: Not on file   Highest education level: Not on file  Occupational History   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   Drug use: No   Sexual activity: Not on file  Other Topics Concern   Not on file  Social History Narrative   Divorced   Social Determinants of Health   Financial Resource Strain: Not on file  Food Insecurity: Not on file  Transportation Needs: Not on file  Physical Activity: Not on file  Stress: Not on file  Social Connections: Not on file  Intimate Partner Violence: Not on file    Current Outpatient Medications on File Prior to Visit  Medication Sig Dispense Refill   ACCU-CHEK AVIVA PLUS test strip USE AS INSTRUCTED ONCE DAILY 50 strip 7   allopurinol (ZYLOPRIM) 100 MG tablet TAKE 1 TABLET BY MOUTH EVERY DAY 90 tablet 3   amLODipine-olmesartan (AZOR) 10-40 MG tablet TAKE 1 TABLET BY MOUTH EVERY DAY 90 tablet 1   b complex vitamins capsule Take 1 capsule by mouth daily. 90 capsule 1   cloNIDine (CATAPRES) 0.1 MG tablet TAKE 1 TABLET (0.1 MG TOTAL) BY MOUTH 2 (TWO) TIMES DAILY. 180 tablet 3   colchicine 0.6 MG tablet 1 tablet every hour as needed for gout, not to exceed 6/day  6 tablet 4   cyclobenzaprine (FLEXERIL) 10 MG tablet Take 1 tablet (10 mg total) by mouth 2 (two) times daily as needed for muscle spasms. 20 tablet 0   JANUVIA 100 MG tablet TAKE 1 TABLET BY MOUTH EVERY DAY 90 tablet 3   metFORMIN (GLUCOPHAGE) 1000 MG tablet TAKE 1 TABLET BY MOUTH 2 TIMES DAILY WITH A MEAL. 180 tablet 1   methocarbamol (ROBAXIN) 500 MG tablet Take 1 tablet (500 mg total) by mouth every 8 (eight) hours as needed for muscle spasms. 20 tablet 0   Omega-3 Fatty Acids (FISH OIL) 1000 MG CAPS Take 2 capsules (2,000 mg total) by mouth daily. 180 capsule 1   oxyCODONE-acetaminophen (PERCOCET) 10-325 MG tablet Take 1 tablet by mouth 3 (three) times daily as needed for pain. 90 tablet 0   traZODone (DESYREL) 100  MG tablet Take 1 tablet by mouth at bedtime.     No current facility-administered medications on file prior to visit.    Allergies  Allergen Reactions   Lyrica [Pregabalin] Other (See Comments)    Blurred vision   Gabapentin Rash and Other (See Comments)    Diplopia (double vision)    Family History  Problem Relation Age of Onset   Stroke Mother    Diabetes Maternal Grandmother    Cancer Neg Hx    Colon cancer Neg Hx    Esophageal cancer Neg Hx    Rectal cancer Neg Hx    Stomach cancer Neg Hx     BP 138/80 (BP Location: Right Arm, Patient Position: Sitting, Cuff Size: Normal)   Pulse 72   Ht 6\' 3"  (1.905 m)   Wt 215 lb 9.6 oz (97.8 kg)   SpO2 95%   BMI 26.95 kg/m    Review of Systems     Objective:   Physical Exam Pulses: dorsalis pedis intact bilat.   MSK: no deformity of the feet CV: trace bilat leg edema Skin:  no ulcer on the feet.  normal color and temp on the feet. Neuro: sensation is intact to touch on the feet   Lab Results  Component Value Date   HGBA1C 8.3 (A) 06/04/2021       Assessment & Plan:  Type 2 DM: uncontrolled  Patient Instructions  I have sent a prescription to your pharmacy, to increase repaglinide to 2 mg twice a day (breakfast and supper). Please continue the same other diabetes medications.  check your blood sugar once a day.  vary the time of day when you check, between before the 3 meals, and at bedtime.  also check if you have symptoms of your blood sugar being too high or too low.  please keep a record of the readings and bring it to your next appointment here (or you can bring the meter itself).  You can write it on any piece of paper.  please call us sooner if your blood sugar goes below 70, or if you have a lot of readings over 200.   Please come back for a follow-up appointment in 3 months.

## 2021-06-04 NOTE — Patient Instructions (Addendum)
I have sent a prescription to your pharmacy, to increase repaglinide to 2 mg twice a day (breakfast and supper). Please continue the same other diabetes medications.  check your blood sugar once a day.  vary the time of day when you check, between before the 3 meals, and at bedtime.  also check if you have symptoms of your blood sugar being too high or too low.  please keep a record of the readings and bring it to your next appointment here (or you can bring the meter itself).  You can write it on any piece of paper.  please call us sooner if your blood sugar goes below 70, or if you have a lot of readings over 200.   Please come back for a follow-up appointment in 3 months.

## 2021-06-10 ENCOUNTER — Ambulatory Visit: Payer: Medicare Other | Admitting: Endocrinology

## 2021-06-10 DIAGNOSIS — M25561 Pain in right knee: Secondary | ICD-10-CM | POA: Diagnosis not present

## 2021-06-10 DIAGNOSIS — M25512 Pain in left shoulder: Secondary | ICD-10-CM | POA: Diagnosis not present

## 2021-06-10 DIAGNOSIS — M5459 Other low back pain: Secondary | ICD-10-CM | POA: Diagnosis not present

## 2021-06-10 DIAGNOSIS — G894 Chronic pain syndrome: Secondary | ICD-10-CM | POA: Diagnosis not present

## 2021-07-01 NOTE — Progress Notes (Signed)
Surgical Instructions    Your procedure is scheduled on 07/05/21.  Report to [facility] Main Entrance "A" at 5:30 A.M., then check in with the Admitting office.  Call this number if you have problems the morning of surgery:  [phone]   If you have any questions prior to your surgery date call [phone]: Open Monday-Friday 8am-4pm    Remember:  Do not eat or drink after midnight the night before your surgery     Take these medicines the morning of surgery with A SIP OF WATER: allopurinol (ZYLOPRIM)   If Needed: colchicine  cyclobenzaprine (FLEXERIL) methocarbamol (ROBAXIN) methocarbamol (ROBAXIN)   As of today, STOP taking any Aspirin (unless otherwise instructed by your surgeon) Aleve, Naproxen, Ibuprofen, Motrin, Advil, Goody's, BC's, all herbal medications, fish oil, and all vitamins.  WHAT DO I DO ABOUT MY DIABETES MEDICATION?   Do not take oral diabetes medicines (pills) the morning of surgery.   HOW TO MANAGE YOUR DIABETES BEFORE AND AFTER SURGERY  Why is it important to control my blood sugar before and after surgery? Improving blood sugar levels before and after surgery helps healing and can limit problems. A way of improving blood sugar control is eating a healthy diet by:  Eating less sugar and carbohydrates  Increasing activity/exercise  Talking with your doctor about reaching your blood sugar goals High blood sugars (greater than 180 mg/dL) can raise your risk of infections and slow your recovery, so you will need to focus on controlling your diabetes during the weeks before surgery. Make sure that the doctor who takes care of your diabetes knows about your planned surgery including the date and location.  How do I manage my blood sugar before surgery? Check your blood sugar at least 4 times a day, starting 2 days before surgery, to make sure that the level is not too high or low.  Check your blood sugar the morning of your surgery when you wake up and  every 2 hours until you get to the Short Stay unit.  If your blood sugar is less than 70 mg/dL, you will need to treat for low blood sugar: Do not take insulin. Treat a low blood sugar (less than 70 mg/dL) with  cup of clear juice (cranberry or apple), 4 glucose tablets, OR glucose gel. Recheck blood sugar in 15 minutes after treatment (to make sure it is greater than 70 mg/dL). If your blood sugar is not greater than 70 mg/dL on recheck, call [phone] for further instructions. Report your blood sugar to the short stay nurse when you get to Short Stay.  If you are admitted to the hospital after surgery: Your blood sugar will be checked by the staff and you will probably be given insulin after surgery (instead of oral diabetes medicines) to make sure you have good blood sugar levels. The goal for blood sugar control after surgery is 80-180 mg/dL.           Do not wear jewelry  Do not wear lotions, powders, colognes, or deodorant. Do not shave 48 hours prior to surgery.  Men may shave face and neck.              [facility] is not responsible for any belongings or valuables.  Do NOT Smoke (Tobacco/Vaping) or drink Alcohol 24 hours prior to your procedure If you use a CPAP at night, you may bring all equipment for your overnight stay.   Contacts, glasses, dentures or bridgework may not be worn into  surgery, please bring cases for these belongings   For patients admitted to the hospital, discharge time will be determined by your treatment team.   Patients discharged the day of surgery will not be allowed to drive home, and someone needs to stay with them for 24 hours.  ONLY 1 SUPPORT PERSON MAY BE PRESENT WHILE YOU ARE IN SURGERY. IF YOU ARE TO BE ADMITTED ONCE YOU ARE IN YOUR ROOM YOU WILL BE ALLOWED TWO (2) VISITORS.  Minor children may have two parents present. Special consideration for safety and communication needs will be reviewed on a case by case basis.  Special instructions:     Oral Hygiene is also important to reduce your risk of infection.  Remember - BRUSH YOUR TEETH THE MORNING OF SURGERY WITH YOUR REGULAR TOOTHPASTE   [facility]- Preparing For Surgery  Before surgery, you can play an important role. Because skin is not sterile, your skin needs to be as free of germs as possible. You can reduce the number of germs on your skin by washing with CHG (chlorahexidine gluconate) Soap before surgery.  CHG is an antiseptic cleaner which kills germs and bonds with the skin to continue killing germs even after washing.     Please do not use if you have an allergy to CHG or antibacterial soaps. If your skin becomes reddened/irritated stop using the CHG.  Do not shave (including legs and underarms) for at least 48 hours prior to first CHG shower. It is OK to shave your face.  Please follow these instructions carefully.     Shower the NIGHT BEFORE SURGERY and the MORNING OF SURGERY with CHG Soap.   If you chose to wash your hair, wash your hair first as usual with your normal shampoo. After you shampoo, rinse your hair and body thoroughly to remove the shampoo.  Then [employer] and genitals (private parts) with your normal soap and rinse thoroughly to remove soap.  After that Use CHG Soap as you would any other liquid soap. You can apply CHG directly to the skin and wash gently with a scrungie or a clean washcloth.   Apply the CHG Soap to your body ONLY FROM THE NECK DOWN.  Do not use on open wounds or open sores. Avoid contact with your eyes, ears, mouth and genitals (private parts). Wash Face and genitals (private parts)  with your normal soap.   Wash thoroughly, paying special attention to the area where your surgery will be performed.  Thoroughly rinse your body with warm water from the neck down.  DO NOT shower/wash with your normal soap after using and rinsing off the CHG Soap.  Pat yourself dry with a CLEAN TOWEL.  Wear CLEAN PAJAMAS to bed the night before  surgery  Place CLEAN SHEETS on your bed the night before your surgery  DO NOT SLEEP WITH PETS.   Day of Surgery:  Take a shower with CHG soap. Wear Clean/Comfortable clothing the morning of surgery Do not apply any deodorants/lotions.   Remember to brush your teeth WITH YOUR REGULAR TOOTHPASTE.   Please read over the following fact sheets that you were given.

## 2021-07-02 ENCOUNTER — Other Ambulatory Visit: Payer: Self-pay

## 2021-07-02 ENCOUNTER — Encounter (HOSPITAL_COMMUNITY): Payer: Self-pay

## 2021-07-02 ENCOUNTER — Encounter (HOSPITAL_COMMUNITY)
Admission: RE | Admit: 2021-07-02 | Discharge: 2021-07-02 | Disposition: A | Discharge status: Home / Self Care | Payer: Medicare Other | Source: Ambulatory Visit | Attending: Neurosurgery | Admitting: Neurosurgery

## 2021-07-02 DIAGNOSIS — R011 Cardiac murmur, unspecified: Secondary | ICD-10-CM | POA: Insufficient documentation

## 2021-07-02 DIAGNOSIS — Z7901 Long term (current) use of anticoagulants: Secondary | ICD-10-CM | POA: Insufficient documentation

## 2021-07-02 DIAGNOSIS — Z79899 Other long term (current) drug therapy: Secondary | ICD-10-CM | POA: Insufficient documentation

## 2021-07-02 DIAGNOSIS — E785 Hyperlipidemia, unspecified: Secondary | ICD-10-CM | POA: Diagnosis not present

## 2021-07-02 DIAGNOSIS — Z01812 Encounter for preprocedural laboratory examination: Secondary | ICD-10-CM | POA: Diagnosis not present

## 2021-07-02 DIAGNOSIS — Z20822 Contact with and (suspected) exposure to covid-19: Secondary | ICD-10-CM | POA: Diagnosis not present

## 2021-07-02 DIAGNOSIS — I10 Essential (primary) hypertension: Secondary | ICD-10-CM | POA: Insufficient documentation

## 2021-07-02 DIAGNOSIS — M5124 Other intervertebral disc displacement, thoracic region: Secondary | ICD-10-CM | POA: Diagnosis not present

## 2021-07-02 DIAGNOSIS — Z7984 Long term (current) use of oral hypoglycemic drugs: Secondary | ICD-10-CM | POA: Insufficient documentation

## 2021-07-02 DIAGNOSIS — E118 Type 2 diabetes mellitus with unspecified complications: Secondary | ICD-10-CM | POA: Diagnosis not present

## 2021-07-02 HISTORY — DX: Malignant (primary) neoplasm, unspecified: C80.1

## 2021-07-02 LAB — COMPREHENSIVE METABOLIC PANEL
ALT: 32 U/L (ref 0–44)
AST: 27 U/L (ref 15–41)
Albumin: 4.6 g/dL (ref 3.5–5.0)
Alkaline Phosphatase: 40 U/L (ref 38–126)
Anion gap: 7 (ref 5–15)
BUN: 14 mg/dL (ref 8–23)
CO2: 25 mmol/L (ref 22–32)
Calcium: 10 mg/dL (ref 8.9–10.3)
Chloride: 102 mmol/L (ref 98–111)
Creatinine, Ser: 0.94 mg/dL (ref 0.61–1.24)
GFR, Estimated: >60 mL/min (ref >60)
Glucose, Bld: 294 mg/dL — ABNORMAL HIGH (ref 70–99)
Potassium: 3.9 mmol/L (ref 3.5–5.1)
Sodium: 134 mmol/L — ABNORMAL LOW (ref 135–145)
Total Bilirubin: 0.7 mg/dL (ref 0.3–1.2)
Total Protein: 7.4 g/dL (ref 6.5–8.1)

## 2021-07-02 LAB — GLUCOSE, CAPILLARY: Glucose-Capillary: 326 mg/dL — ABNORMAL HIGH (ref 70–99)

## 2021-07-02 LAB — CBC
HCT: 41.5 % (ref 39.0–52.0)
Hemoglobin: 14.4 g/dL (ref 13.0–17.0)
MCH: 31.2 pg (ref 26.0–34.0)
MCHC: 34.7 g/dL (ref 30.0–36.0)
MCV: 89.8 fL (ref 80.0–100.0)
Platelets: 229 10*3/uL (ref 150–400)
RBC: 4.62 MIL/uL (ref 4.22–5.81)
RDW: 12 % (ref 11.5–15.5)
WBC: 8.5 10*3/uL (ref 4.0–10.5)
nRBC: 0 % (ref 0.0–0.2)

## 2021-07-02 LAB — SURGICAL PCR SCREEN
MRSA, PCR: NEGATIVE
Staphylococcus aureus: NEGATIVE

## 2021-07-02 LAB — HEMOGLOBIN A1C
Hgb A1c MFr Bld: 7.4 % — ABNORMAL HIGH (ref 4.8–5.6)
Mean Plasma Glucose: 165.68 mg/dL

## 2021-07-02 LAB — SARS CORONAVIRUS 2 (TAT 6-24 HRS): SARS Coronavirus 2: NEGATIVE

## 2021-07-02 NOTE — Progress Notes (Signed)
PCP: Leamon Arnt, MD Cardiologist: Lyman Bishop, MD  EKG: 07/02/21 CXR: 04/11/21 ECHO: 05/05/13 Stress Test: denies Cardiac Cath: denies  Fasting Blood Sugar- 200's Checks Blood Sugar__1_ times a week.  Does not check fasting levels.  OSA/CPAP: No  ASA/Blood Thinner: No  Covid test 07/02/21 at PAT  Anesthesia Review: Yes, abnormal EKG. BG 326 at PAT. Obtained A1C  Patient denies shortness of breath, fever, cough, and chest pain at PAT appointment.  Patient verbalized understanding of instructions provided today at the PAT appointment.  Patient asked to review instructions at home and day of surgery.

## 2021-07-03 ENCOUNTER — Encounter (HOSPITAL_COMMUNITY): Payer: Self-pay

## 2021-07-03 NOTE — Progress Notes (Signed)
Anesthesia Chart Review:  Case: [id number] Date/Time: 07/05/21 0715   Procedure: Left Thoracic 12 to Lumbar 1 Transpedicular microdiscectomy (Left) - 3C/RM 21   Anesthesia type: General   Pre-op diagnosis: Thoracic herniated nucleus pulposus   Location: [facility] OR ROOM 21 / [facility] OR   Surgeons: Erline Levine, MD       DISCUSSION: Patient is an 80 year old male scheduled for the above procedure.  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  Cirrhosis with mild splenomegaly on May 2022 CT scan (LFTs, PLT count normal 07/02/21).  Last endocrinology visit 06/04/2021 by Dr. Loanne Drilling.  He notes patient had a steroid pack about 6 weeks prior for back pain.  A1c 8.3%.  Repaglinide increased.  Home monitoring of glucose recommended with instructions to contact endocrinology if CBG less than 70 or several readings over 200.  32-monthfollow-up planned. Although non-fasting glucose quite elevated at PAT (326 by CBG), his A1c was down to 7.4%.  A1c, CMET, and CBC results routed to Dr. SVertell Limber  07/02/2021 presurgical COVID-19 test negative.  Patient will get a CBG on arrival.  Anesthesia team to evaluate on the day of surgery.  He denied chest pain, cough, fever, shortness of breath Per PAT RN interview.   VS: BP (!) 142/82   Pulse 89   Temp 36.9 C (Oral)   Resp 18   Ht '6\' 1"'$  (1.854 m)   Wt 96.5 kg   SpO2 97%   BMI 28.08 kg/m    PROVIDERS: KLibby Maw MD is PCP  - ERenato Shin MD is endocrinologist - He is not followed routinely by cardiology, but he saw HLyman Bishop MD 10/01/17 for heart murmur, determined to most likely be aortic sclerosis; no further cardiac follow-up necessary.    LABS: Preoperative labs noted. Non-fasting  CBG 326, serum glucose 294. A1c 7.4%, down from 8.3% on 06/04/21. (all labs ordered are listed, but only abnormal results are displayed)  Labs Reviewed  COMPREHENSIVE METABOLIC PANEL - Abnormal; Notable for the following components:      Result Value   Sodium 134 (*)    Glucose, Bld 294 (*)    All other components within normal limits  HEMOGLOBIN A1C - Abnormal; Notable for the following components:   Hgb A1c MFr Bld 7.4 (*)    All other components within normal limits  GLUCOSE, CAPILLARY - Abnormal; Notable for the following components:   Glucose-Capillary 326 (*)    All other components within normal limits  SURGICAL PCR SCREEN  SARS CORONAVIRUS 2 (TAT 6-24 HRS)  CBC     IMAGES: Left Ribs Xray 04/18/21: FINDINGS: Lungs are adequately inflated with stable moderate elevation of the left hemidiaphragm. Lungs are otherwise clear. Cardiomediastinal silhouette and remainder of the chest is unchanged. No evidence of left rib fracture or focal rib abnormality. IMPRESSION: 1. No acute cardiopulmonary disease. 2. No acute findings.  No left rib fracture.  MRI T/L Spine 05/15/21: IMPRESSION: T-Spine: - Motion degraded study. - Severe right foraminal encroachment T9-10 due to spurring with mild spinal stenosis. Foraminal narrowing bilaterally at T10-11 and T11-T12 due to disc and facet degeneration - Extruded disc fragment left at T12-L1 with marked left foraminal encroachment. L-Spine: 1. There are 6 non-rib-bearing lumbar vertebra which will be labeled L1 through L6. This is based  on counting from the craniocervical junction on the thoracic MRI today. Numbering has changed compared to earlier studies based on this information. 2. Extruded disc fragment on the left T12-L1 with left foraminal encroachment. 3. Extensive multilevel lumbar spondylosis and scoliosis, causing multilevel foraminal encroachment.  CT Abd/pelvis 04/12/21: IMPRESSION: 1. No acute intra-abdominal or pelvic  pathology. 2. Sigmoid diverticulosis. No bowel obstruction. Normal appendix. 3. Cirrhosis with mild splenomegaly. 4. Aortic Atherosclerosis (ICD10-I70.0).   EKG: 07/02/21:  Sinus rhythm with Premature supraventricular complexes Incomplete right bundle branch block Left anterior fascicular block No significant change since last tracing Confirmed by Croitoru, Mihai [phone]) on 07/02/2021 10:11:01 PM   CV: Echo 05/05/13: Study Conclusions - Left ventricle: The cavity size was normal. Wall thickness was increased in a pattern of mild LVH. Systolic function was normal. The estimated ejection fraction was in the range of 55% to 60%. Features are consistent with a pseudonormal left ventricular filling pattern, with concomitant abnormal relaxation and increased filling pressure (grade 2 diastolic dysfunction). - Aortic valve: Mildly thickened, mildly calcified leaflets.  No significant regurgitation. -Mitral valve: Mildly thickened leaflets.  No significant regurgitation.  Peak gradient 4 mmHg.    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   Heart murmur    but was not stated by DR Hilty   Hepatitis    took treatment   HEPATITIS C 08/25/2007   HYPERTENSION 08/25/2007   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   no per pt   HYPOGONADISM 09/27/2009    Past Surgical History:  Procedure Laterality Date   ABDOMINAL HERNIA REPAIR  2000   ANTERIOR CERVICAL DECOMP/DISCECTOMY FUSION Right 03/04/2018   Procedure: Cervical Three-Four, Cervical Six-Seven Right Side approach, Anterior cervical decompression/discectomy/fusion;  Surgeon: Erline Levine, MD;   Location: [facility];  Service: Neurosurgery;  Laterality: Right;  right side anterior approach   CHOLECYSTECTOMY  1999   COLONOSCOPY     LUMBAR FUSION  2005   TONSILLECTOMY AND ADENOIDECTOMY     UPPER GASTROINTESTINAL ENDOSCOPY      MEDICATIONS:  ACCU-CHEK AVIVA PLUS test strip   allopurinol (ZYLOPRIM) 100 MG tablet   amLODipine-olmesartan (AZOR) 10-40 MG tablet   b complex vitamins capsule   cloNIDine (CATAPRES) 0.1 MG tablet   colchicine 0.6 MG tablet   cyclobenzaprine (FLEXERIL) 10 MG tablet   JANUVIA 100 MG tablet   metFORMIN (GLUCOPHAGE) 1000 MG tablet   methocarbamol (ROBAXIN) 500 MG tablet   Multiple Vitamins-Minerals (MULTIVITAMIN WITH MINERALS) tablet   Omega-3 Fatty Acids (FISH OIL) 1000 MG CAPS   oxyCODONE-acetaminophen (PERCOCET) 10-325 MG tablet   repaglinide (PRANDIN) 2 MG tablet   traZODone (DESYREL) 100 MG tablet   No current facility-administered medications for this encounter.    Myra Gianotti, PA-C Surgical Short Stay/Anesthesiology [facility] Phone [phone] [facility] Phone [phone] 07/03/2021 2:33 PM

## 2021-07-03 NOTE — Anesthesia Preprocedure Evaluation (Addendum)
Anesthesia Evaluation  Patient identified by MRN, date of birth, ID band Patient awake    Reviewed: Allergy & Precautions, NPO status , Patient's Chart, lab work & pertinent test results  Airway Mallampati: I  TM Distance: >3 FB Neck ROM: Full    Dental  (+) Edentulous Upper, Edentulous Lower   Pulmonary neg pulmonary ROS,    breath sounds clear to auscultation       Cardiovascular hypertension, Pt. on medications  Rhythm:Regular Rate:Normal     Neuro/Psych negative psych ROS   GI/Hepatic GERD  ,(+) Hepatitis -, C  Endo/Other  diabetes, Type 2, Oral Hypoglycemic Agents  Renal/GU      Musculoskeletal  (+) Arthritis ,   Abdominal Normal abdominal exam  (+)   Peds  Hematology   Anesthesia Other Findings   Reproductive/Obstetrics                           Anesthesia Physical Anesthesia Plan  ASA: 3  Anesthesia Plan: General   Post-op Pain Management:    Induction: Intravenous  PONV Risk Score and Plan: 3 and Ondansetron, Dexamethasone and Treatment may vary due to age or medical condition  Airway Management Planned: Oral ETT  Additional Equipment: None  Intra-op Plan:   Post-operative Plan: Extubation in OR  Informed Consent: I have reviewed the patients History and Physical, chart, labs and discussed the procedure including the risks, benefits and alternatives for the proposed anesthesia with the patient or authorized representative who has indicated his/her understanding and acceptance.     Dental advisory given  Plan Discussed with: CRNA  Anesthesia Plan Comments: (PAT note written 07/03/2021 by Myra Gianotti, PA-C. )       Anesthesia Quick Evaluation

## 2021-07-05 ENCOUNTER — Ambulatory Visit (HOSPITAL_COMMUNITY): Payer: Medicare Other | Admitting: Certified Registered Nurse Anesthetist

## 2021-07-05 ENCOUNTER — Observation Stay (HOSPITAL_COMMUNITY)
Admission: RE | Admit: 2021-07-05 | Discharge: 2021-07-06 | Disposition: A | Discharge status: Home / Self Care | Payer: Medicare Other | Attending: Neurosurgery | Admitting: Neurosurgery

## 2021-07-05 ENCOUNTER — Other Ambulatory Visit: Payer: Self-pay

## 2021-07-05 ENCOUNTER — Ambulatory Visit (HOSPITAL_COMMUNITY): Payer: Medicare Other

## 2021-07-05 ENCOUNTER — Ambulatory Visit (HOSPITAL_COMMUNITY): Payer: Medicare Other | Admitting: Vascular Surgery

## 2021-07-05 ENCOUNTER — Encounter (HOSPITAL_COMMUNITY)
Admission: RE | Disposition: A | Discharge status: Home / Self Care | Payer: Self-pay | Source: Home / Self Care | Attending: Neurosurgery

## 2021-07-05 ENCOUNTER — Encounter (HOSPITAL_COMMUNITY): Payer: Self-pay | Admitting: Neurosurgery

## 2021-07-05 DIAGNOSIS — M546 Pain in thoracic spine: Secondary | ICD-10-CM | POA: Diagnosis present

## 2021-07-05 DIAGNOSIS — Z7984 Long term (current) use of oral hypoglycemic drugs: Secondary | ICD-10-CM | POA: Insufficient documentation

## 2021-07-05 DIAGNOSIS — M5124 Other intervertebral disc displacement, thoracic region: Principal | ICD-10-CM | POA: Diagnosis present

## 2021-07-05 DIAGNOSIS — M5415 Radiculopathy, thoracolumbar region: Secondary | ICD-10-CM | POA: Diagnosis not present

## 2021-07-05 DIAGNOSIS — Z419 Encounter for procedure for purposes other than remedying health state, unspecified: Secondary | ICD-10-CM

## 2021-07-05 DIAGNOSIS — I4891 Unspecified atrial fibrillation: Secondary | ICD-10-CM | POA: Diagnosis not present

## 2021-07-05 DIAGNOSIS — D696 Thrombocytopenia, unspecified: Secondary | ICD-10-CM | POA: Diagnosis not present

## 2021-07-05 DIAGNOSIS — M5115 Intervertebral disc disorders with radiculopathy, thoracolumbar region: Secondary | ICD-10-CM | POA: Diagnosis not present

## 2021-07-05 DIAGNOSIS — Z9889 Other specified postprocedural states: Secondary | ICD-10-CM | POA: Diagnosis not present

## 2021-07-05 DIAGNOSIS — I700 Atherosclerosis of aorta: Secondary | ICD-10-CM | POA: Diagnosis not present

## 2021-07-05 HISTORY — PX: LUMBAR LAMINECTOMY/DECOMPRESSION MICRODISCECTOMY: SHX5026

## 2021-07-05 LAB — GLUCOSE, CAPILLARY
Glucose-Capillary: 211 mg/dL — ABNORMAL HIGH (ref 70–99)
Glucose-Capillary: 202 mg/dL — ABNORMAL HIGH (ref 70–99)
Glucose-Capillary: 289 mg/dL — ABNORMAL HIGH (ref 70–99)
Glucose-Capillary: 309 mg/dL — ABNORMAL HIGH (ref 70–99)
Glucose-Capillary: 243 mg/dL — ABNORMAL HIGH (ref 70–99)

## 2021-07-05 SURGERY — LUMBAR LAMINECTOMY/DECOMPRESSION MICRODISCECTOMY 1 LEVEL
Anesthesia: General | Laterality: Left

## 2021-07-05 MED ORDER — ACETAMINOPHEN 10 MG/ML IV SOLN: 1000.0000 mg | Freq: Once | INTRAVENOUS | Status: DC | PRN

## 2021-07-05 MED ORDER — TRAZODONE HCL 100 MG PO TABS
100.0000 mg | ORAL_TABLET | Freq: Every evening | ORAL | Status: DC | PRN
  Filled 2021-07-05: qty 1

## 2021-07-05 MED ORDER — CHLORHEXIDINE GLUCONATE CLOTH 2 % EX PADS: 6.0000 | MEDICATED_PAD | Freq: Once | CUTANEOUS | Status: DC

## 2021-07-05 MED ORDER — PANTOPRAZOLE SODIUM 40 MG IV SOLR
40.0000 mg | Freq: Every day | INTRAVENOUS | Status: DC
  Administered 2021-07-05: 40 mg via INTRAVENOUS
  Filled 2021-07-05: qty 40

## 2021-07-05 MED ORDER — DOCUSATE SODIUM 100 MG PO CAPS
100.0000 mg | ORAL_CAPSULE | Freq: Two times a day (BID) | ORAL | Status: DC
  Administered 2021-07-05 (×2): 100 mg via ORAL
  Filled 2021-07-05 (×2): qty 1

## 2021-07-05 MED ORDER — B COMPLEX-C PO TABS
1.0000 | ORAL_TABLET | Freq: Every day | ORAL | Status: DC
  Administered 2021-07-05: 1 via ORAL
  Filled 2021-07-05 (×2): qty 1

## 2021-07-05 MED ORDER — IRBESARTAN 150 MG PO TABS
300.0000 mg | ORAL_TABLET | Freq: Every day | ORAL | Status: DC
  Administered 2021-07-05: 300 mg via ORAL
  Filled 2021-07-05: qty 2

## 2021-07-05 MED ORDER — DEXAMETHASONE SODIUM PHOSPHATE 10 MG/ML IJ SOLN
INTRAMUSCULAR | Status: AC
  Filled 2021-07-05: qty 1

## 2021-07-05 MED ORDER — ADULT MULTIVITAMIN W/MINERALS CH: 1.0000 | ORAL_TABLET | Freq: Every day | ORAL | Status: DC

## 2021-07-05 MED ORDER — LIDOCAINE 2% (20 MG/ML) 5 ML SYRINGE
INTRAMUSCULAR | Status: AC
  Filled 2021-07-05: qty 5

## 2021-07-05 MED ORDER — PROPOFOL 10 MG/ML IV BOLUS
INTRAVENOUS | Status: DC | PRN
  Administered 2021-07-05: 130 mg via INTRAVENOUS

## 2021-07-05 MED ORDER — ACETAMINOPHEN 325 MG PO TABS: 325.0000 mg | ORAL_TABLET | ORAL | Status: DC | PRN

## 2021-07-05 MED ORDER — METHOCARBAMOL 1000 MG/10ML IJ SOLN
500.0000 mg | Freq: Four times a day (QID) | INTRAVENOUS | Status: DC | PRN
  Filled 2021-07-05: qty 5

## 2021-07-05 MED ORDER — ONDANSETRON HCL 4 MG PO TABS: 4.0000 mg | ORAL_TABLET | Freq: Four times a day (QID) | ORAL | Status: DC | PRN

## 2021-07-05 MED ORDER — ALUM & MAG HYDROXIDE-SIMETH 200-200-20 MG/5ML PO SUSP: 30.0000 mL | Freq: Four times a day (QID) | ORAL | Status: DC | PRN

## 2021-07-05 MED ORDER — PROPOFOL 10 MG/ML IV BOLUS
INTRAVENOUS | Status: AC
  Filled 2021-07-05: qty 40

## 2021-07-05 MED ORDER — OXYCODONE HCL 5 MG PO TABS
5.0000 mg | ORAL_TABLET | Freq: Once | ORAL | Status: AC | PRN
  Administered 2021-07-05: 5 mg via ORAL

## 2021-07-05 MED ORDER — ZOLPIDEM TARTRATE 5 MG PO TABS: 5.0000 mg | ORAL_TABLET | Freq: Every evening | ORAL | Status: DC | PRN

## 2021-07-05 MED ORDER — LIDOCAINE 2% (20 MG/ML) 5 ML SYRINGE
INTRAMUSCULAR | Status: DC | PRN
Start: 2021-07-05 — End: 2021-07-05
  Administered 2021-07-05: 40 mg via INTRAVENOUS

## 2021-07-05 MED ORDER — POLYETHYLENE GLYCOL 3350 17 G PO PACK: 17.0000 g | PACK | Freq: Every day | ORAL | Status: DC | PRN

## 2021-07-05 MED ORDER — ONDANSETRON HCL 4 MG/2ML IJ SOLN
INTRAMUSCULAR | Status: AC
  Filled 2021-07-05: qty 2

## 2021-07-05 MED ORDER — FENTANYL CITRATE (PF) 100 MCG/2ML IJ SOLN: 25.0000 ug | INTRAMUSCULAR | Status: DC | PRN

## 2021-07-05 MED ORDER — ONDANSETRON HCL 4 MG/2ML IJ SOLN
INTRAMUSCULAR | Status: DC | PRN
  Administered 2021-07-05: 4 mg via INTRAVENOUS

## 2021-07-05 MED ORDER — LINAGLIPTIN 5 MG PO TABS
5.0000 mg | ORAL_TABLET | Freq: Every day | ORAL | Status: DC
  Administered 2021-07-05: 5 mg via ORAL
  Filled 2021-07-05 (×2): qty 1

## 2021-07-05 MED ORDER — METFORMIN HCL 500 MG PO TABS
1000.0000 mg | ORAL_TABLET | Freq: Two times a day (BID) | ORAL | Status: DC
  Administered 2021-07-05 – 2021-07-06 (×2): 1000 mg via ORAL
  Filled 2021-07-05 (×2): qty 2

## 2021-07-05 MED ORDER — METHOCARBAMOL 500 MG PO TABS
500.0000 mg | ORAL_TABLET | Freq: Four times a day (QID) | ORAL | Status: DC | PRN
  Administered 2021-07-05 – 2021-07-06 (×3): 500 mg via ORAL
  Filled 2021-07-05 (×3): qty 1

## 2021-07-05 MED ORDER — OXYCODONE HCL 5 MG/5ML PO SOLN: 5.0000 mg | Freq: Once | ORAL | Status: AC | PRN

## 2021-07-05 MED ORDER — LACTATED RINGERS IV SOLN: INTRAVENOUS | Status: DC

## 2021-07-05 MED ORDER — SODIUM CHLORIDE 0.9 % IV SOLN
250.0000 mL | INTRAVENOUS | Status: DC
  Administered 2021-07-05: 250 mL via INTRAVENOUS

## 2021-07-05 MED ORDER — PROMETHAZINE HCL 25 MG/ML IJ SOLN: 6.2500 mg | INTRAMUSCULAR | Status: DC | PRN

## 2021-07-05 MED ORDER — HYDROMORPHONE HCL 1 MG/ML IJ SOLN
0.5000 mg | INTRAMUSCULAR | Status: DC | PRN
Start: 2021-07-05 — End: 2021-07-06
  Administered 2021-07-05: 0.5 mg via INTRAVENOUS
  Filled 2021-07-05: qty 0.5

## 2021-07-05 MED ORDER — FENTANYL CITRATE (PF) 100 MCG/2ML IJ SOLN
25.0000 ug | INTRAMUSCULAR | Status: DC | PRN
  Administered 2021-07-05 (×2): 25 ug via INTRAVENOUS
  Administered 2021-07-05: 50 ug via INTRAVENOUS

## 2021-07-05 MED ORDER — FENTANYL CITRATE (PF) 100 MCG/2ML IJ SOLN
INTRAMUSCULAR | Status: AC
  Filled 2021-07-05: qty 2

## 2021-07-05 MED ORDER — LIDOCAINE-EPINEPHRINE 1 %-1:100000 IJ SOLN
INTRAMUSCULAR | Status: DC | PRN
  Administered 2021-07-05: 5 mL

## 2021-07-05 MED ORDER — BUPIVACAINE HCL (PF) 0.5 % IJ SOLN
INTRAMUSCULAR | Status: DC | PRN
  Administered 2021-07-05: 5 mL

## 2021-07-05 MED ORDER — ACETAMINOPHEN 325 MG PO TABS: 650.0000 mg | ORAL_TABLET | ORAL | Status: DC | PRN

## 2021-07-05 MED ORDER — 0.9 % SODIUM CHLORIDE (POUR BTL) OPTIME
TOPICAL | Status: DC | PRN
  Administered 2021-07-05: 1000 mL

## 2021-07-05 MED ORDER — METHOCARBAMOL 500 MG PO TABS: 500.0000 mg | ORAL_TABLET | Freq: Three times a day (TID) | ORAL | Status: DC | PRN

## 2021-07-05 MED ORDER — THROMBIN 5000 UNITS EX SOLR: OROMUCOSAL | Status: DC | PRN

## 2021-07-05 MED ORDER — CHLORHEXIDINE GLUCONATE 0.12 % MT SOLN
15.0000 mL | Freq: Once | OROMUCOSAL | Status: AC
  Administered 2021-07-05: 15 mL via OROMUCOSAL
  Filled 2021-07-05: qty 15

## 2021-07-05 MED ORDER — ORAL CARE MOUTH RINSE: 15.0000 mL | Freq: Once | OROMUCOSAL | Status: AC

## 2021-07-05 MED ORDER — PHENOL 1.4 % MT LIQD: 1.0000 | OROMUCOSAL | Status: DC | PRN

## 2021-07-05 MED ORDER — ONDANSETRON HCL 4 MG/2ML IJ SOLN
4.0000 mg | Freq: Four times a day (QID) | INTRAMUSCULAR | Status: DC | PRN
Start: 2021-07-05 — End: 2021-07-06

## 2021-07-05 MED ORDER — FENTANYL CITRATE (PF) 250 MCG/5ML IJ SOLN
INTRAMUSCULAR | Status: AC
  Filled 2021-07-05: qty 5

## 2021-07-05 MED ORDER — SUGAMMADEX SODIUM 200 MG/2ML IV SOLN
INTRAVENOUS | Status: DC | PRN
  Administered 2021-07-05: 200 mg via INTRAVENOUS

## 2021-07-05 MED ORDER — FLEET ENEMA 7-19 GM/118ML RE ENEM
1.0000 | ENEMA | Freq: Once | RECTAL | Status: DC | PRN
Start: 2021-07-05 — End: 2021-07-06

## 2021-07-05 MED ORDER — BUPIVACAINE HCL (PF) 0.5 % IJ SOLN
INTRAMUSCULAR | Status: AC
  Filled 2021-07-05: qty 30

## 2021-07-05 MED ORDER — ROCURONIUM BROMIDE 10 MG/ML (PF) SYRINGE
PREFILLED_SYRINGE | INTRAVENOUS | Status: AC
  Filled 2021-07-05: qty 10

## 2021-07-05 MED ORDER — LIDOCAINE-EPINEPHRINE 1 %-1:100000 IJ SOLN
INTRAMUSCULAR | Status: AC
  Filled 2021-07-05: qty 1

## 2021-07-05 MED ORDER — AMLODIPINE-OLMESARTAN 10-40 MG PO TABS: 1.0000 | ORAL_TABLET | Freq: Every day | ORAL | Status: DC

## 2021-07-05 MED ORDER — MENTHOL 3 MG MT LOZG: 1.0000 | LOZENGE | OROMUCOSAL | Status: DC | PRN

## 2021-07-05 MED ORDER — SODIUM CHLORIDE 0.9% FLUSH
3.0000 mL | Freq: Two times a day (BID) | INTRAVENOUS | Status: DC
  Administered 2021-07-05 (×2): 3 mL via INTRAVENOUS

## 2021-07-05 MED ORDER — PHENYLEPHRINE HCL-NACL 20-0.9 MG/250ML-% IV SOLN
INTRAVENOUS | Status: DC | PRN
  Administered 2021-07-05: 20 ug/min via INTRAVENOUS

## 2021-07-05 MED ORDER — BISACODYL 10 MG RE SUPP: 10.0000 mg | Freq: Every day | RECTAL | Status: DC | PRN

## 2021-07-05 MED ORDER — COLCHICINE 0.6 MG PO TABS
0.6000 mg | ORAL_TABLET | ORAL | Status: DC | PRN
  Filled 2021-07-05: qty 1

## 2021-07-05 MED ORDER — PHENYLEPHRINE 40 MCG/ML (10ML) SYRINGE FOR IV PUSH (FOR BLOOD PRESSURE SUPPORT)
PREFILLED_SYRINGE | INTRAVENOUS | Status: AC
  Filled 2021-07-05: qty 10

## 2021-07-05 MED ORDER — ROCURONIUM BROMIDE 10 MG/ML (PF) SYRINGE
PREFILLED_SYRINGE | INTRAVENOUS | Status: DC | PRN
  Administered 2021-07-05: 60 mg via INTRAVENOUS
  Administered 2021-07-05 (×3): 10 mg via INTRAVENOUS

## 2021-07-05 MED ORDER — THROMBIN 5000 UNITS EX SOLR
CUTANEOUS | Status: AC
  Filled 2021-07-05: qty 5000

## 2021-07-05 MED ORDER — SODIUM CHLORIDE 0.9% FLUSH: 3.0000 mL | INTRAVENOUS | Status: DC | PRN

## 2021-07-05 MED ORDER — FENTANYL CITRATE (PF) 250 MCG/5ML IJ SOLN
INTRAMUSCULAR | Status: DC | PRN
  Administered 2021-07-05: 50 ug via INTRAVENOUS
  Administered 2021-07-05: 150 ug via INTRAVENOUS
  Administered 2021-07-05: 50 ug via INTRAVENOUS

## 2021-07-05 MED ORDER — ALLOPURINOL 100 MG PO TABS
100.0000 mg | ORAL_TABLET | Freq: Every day | ORAL | Status: DC
  Filled 2021-07-05: qty 1

## 2021-07-05 MED ORDER — CEFAZOLIN SODIUM-DEXTROSE 2-4 GM/100ML-% IV SOLN
2.0000 g | INTRAVENOUS | Status: AC
  Administered 2021-07-05: 2 g via INTRAVENOUS
  Filled 2021-07-05: qty 100

## 2021-07-05 MED ORDER — CYCLOBENZAPRINE HCL 10 MG PO TABS
10.0000 mg | ORAL_TABLET | Freq: Two times a day (BID) | ORAL | Status: DC | PRN
  Administered 2021-07-05: 10 mg via ORAL
  Filled 2021-07-05: qty 1

## 2021-07-05 MED ORDER — OXYCODONE-ACETAMINOPHEN 10-325 MG PO TABS: 1.0000 | ORAL_TABLET | Freq: Every day | ORAL | Status: DC

## 2021-07-05 MED ORDER — AMLODIPINE BESYLATE 5 MG PO TABS
10.0000 mg | ORAL_TABLET | Freq: Every day | ORAL | Status: DC
  Administered 2021-07-05: 10 mg via ORAL
  Filled 2021-07-05: qty 2

## 2021-07-05 MED ORDER — PHENYLEPHRINE 40 MCG/ML (10ML) SYRINGE FOR IV PUSH (FOR BLOOD PRESSURE SUPPORT)
PREFILLED_SYRINGE | INTRAVENOUS | Status: DC | PRN
  Administered 2021-07-05: 80 ug via INTRAVENOUS

## 2021-07-05 MED ORDER — ACETAMINOPHEN 650 MG RE SUPP: 650.0000 mg | RECTAL | Status: DC | PRN

## 2021-07-05 MED ORDER — OXYCODONE HCL 5 MG PO TABS
ORAL_TABLET | ORAL | Status: AC
  Filled 2021-07-05: qty 1

## 2021-07-05 MED ORDER — CEFAZOLIN SODIUM-DEXTROSE 2-4 GM/100ML-% IV SOLN
2.0000 g | Freq: Three times a day (TID) | INTRAVENOUS | Status: AC
Start: 2021-07-05 — End: 2021-07-06
  Administered 2021-07-05 (×2): 2 g via INTRAVENOUS
  Filled 2021-07-05 (×2): qty 100

## 2021-07-05 MED ORDER — OXYCODONE HCL 5 MG PO TABS: 5.0000 mg | ORAL_TABLET | ORAL | Status: DC | PRN

## 2021-07-05 MED ORDER — OXYCODONE-ACETAMINOPHEN 5-325 MG PO TABS
1.0000 | ORAL_TABLET | ORAL | Status: DC
  Administered 2021-07-05 – 2021-07-06 (×6): 1 via ORAL
  Filled 2021-07-05 (×7): qty 1

## 2021-07-05 MED ORDER — KCL IN DEXTROSE-NACL 20-5-0.45 MEQ/L-%-% IV SOLN: INTRAVENOUS | Status: DC

## 2021-07-05 MED ORDER — OMEGA-3-ACID ETHYL ESTERS 1 G PO CAPS
2.0000 g | ORAL_CAPSULE | Freq: Every day | ORAL | Status: DC
  Administered 2021-07-05: 2 g via ORAL
  Filled 2021-07-05: qty 2

## 2021-07-05 MED ORDER — ACETAMINOPHEN 160 MG/5ML PO SOLN: 325.0000 mg | ORAL | Status: DC | PRN

## 2021-07-05 MED ORDER — REPAGLINIDE 1 MG PO TABS
2.0000 mg | ORAL_TABLET | Freq: Two times a day (BID) | ORAL | Status: DC
  Administered 2021-07-06: 2 mg via ORAL
  Filled 2021-07-05 (×2): qty 1
  Filled 2021-07-05 (×2): qty 2

## 2021-07-05 MED ORDER — INSULIN ASPART 100 UNIT/ML IJ SOLN
0.0000 [IU] | Freq: Three times a day (TID) | INTRAMUSCULAR | Status: DC
  Administered 2021-07-05: 8 [IU] via SUBCUTANEOUS
  Administered 2021-07-05: 11 [IU] via SUBCUTANEOUS
  Administered 2021-07-06: 5 [IU] via SUBCUTANEOUS

## 2021-07-05 MED ORDER — INSULIN ASPART 100 UNIT/ML IJ SOLN
0.0000 [IU] | Freq: Every day | INTRAMUSCULAR | Status: DC
  Administered 2021-07-05: 2 [IU] via SUBCUTANEOUS

## 2021-07-05 MED ORDER — OXYCODONE HCL 5 MG PO TABS
5.0000 mg | ORAL_TABLET | ORAL | Status: DC
  Administered 2021-07-05 – 2021-07-06 (×6): 5 mg via ORAL
  Filled 2021-07-05 (×7): qty 1

## 2021-07-05 MED ORDER — AMISULPRIDE (ANTIEMETIC) 5 MG/2ML IV SOLN: 10.0000 mg | Freq: Once | INTRAVENOUS | Status: DC | PRN

## 2021-07-05 MED ORDER — DEXAMETHASONE SODIUM PHOSPHATE 10 MG/ML IJ SOLN
INTRAMUSCULAR | Status: DC | PRN
  Administered 2021-07-05: 10 mg via INTRAVENOUS

## 2021-07-05 SURGICAL SUPPLY — 55 items
ADH SKN CLS APL DERMABOND .7 (GAUZE/BANDAGES/DRESSINGS) ×1
BAG COUNTER SPONGE SURGICOUNT (BAG) ×3 IMPLANT
BAG SPNG CNTER NS LX DISP (BAG) ×2
BAND INSRT 18 STRL LF DISP RB (MISCELLANEOUS) ×2
BAND RUBBER #18 3X1/16 STRL (MISCELLANEOUS) ×4 IMPLANT
BLADE CLIPPER SURG (BLADE) IMPLANT
BUR MATCHSTICK NEURO 3.0 LAGG (BURR) ×2 IMPLANT
BUR ROUND FLUTED 5 RND (BURR) ×2 IMPLANT
CANISTER SUCT 3000ML PPV (MISCELLANEOUS) ×2 IMPLANT
CARTRIDGE OIL MAESTRO DRILL (MISCELLANEOUS) ×1 IMPLANT
DECANTER SPIKE VIAL GLASS SM (MISCELLANEOUS) IMPLANT
DERMABOND ADVANCED (GAUZE/BANDAGES/DRESSINGS) ×1
DERMABOND ADVANCED .7 DNX12 (GAUZE/BANDAGES/DRESSINGS) ×1 IMPLANT
DIFFUSER DRILL AIR PNEUMATIC (MISCELLANEOUS) ×2 IMPLANT
DRAPE LAPAROTOMY 100X72X124 (DRAPES) ×2 IMPLANT
DRAPE MICROSCOPE LEICA (MISCELLANEOUS) ×2 IMPLANT
DRAPE SURG 17X23 STRL (DRAPES) ×2 IMPLANT
DRSG OPSITE POSTOP 4X6 (GAUZE/BANDAGES/DRESSINGS) ×1 IMPLANT
DURAPREP 26ML APPLICATOR (WOUND CARE) ×2 IMPLANT
ELECT REM PT RETURN 9FT ADLT (ELECTROSURGICAL) ×2
ELECTRODE REM PT RTRN 9FT ADLT (ELECTROSURGICAL) ×1 IMPLANT
GAUZE 4X4 16PLY ~~LOC~~+RFID DBL (SPONGE) IMPLANT
GAUZE SPONGE 4X4 12PLY STRL (GAUZE/BANDAGES/DRESSINGS) IMPLANT
GLOVE EXAM NITRILE XL STR (GLOVE) IMPLANT
GLOVE SRG 8 PF TXTR STRL LF DI (GLOVE) ×1 IMPLANT
GLOVE SURG ENC MOIS LTX SZ8 (GLOVE) ×2 IMPLANT
GLOVE SURG LTX SZ8 (GLOVE) ×2 IMPLANT
GLOVE SURG UNDER POLY LF SZ8 (GLOVE) ×2
GLOVE SURG UNDER POLY LF SZ8.5 (GLOVE) ×2 IMPLANT
GOWN STRL REUS W/ TWL LRG LVL3 (GOWN DISPOSABLE) IMPLANT
GOWN STRL REUS W/ TWL XL LVL3 (GOWN DISPOSABLE) ×1 IMPLANT
GOWN STRL REUS W/TWL 2XL LVL3 (GOWN DISPOSABLE) ×2 IMPLANT
GOWN STRL REUS W/TWL LRG LVL3 (GOWN DISPOSABLE)
GOWN STRL REUS W/TWL XL LVL3 (GOWN DISPOSABLE) ×2
HEMOSTAT POWDER KIT SURGIFOAM (HEMOSTASIS) ×2 IMPLANT
KIT BASIN OR (CUSTOM PROCEDURE TRAY) ×2 IMPLANT
KIT TURNOVER KIT B (KITS) ×2 IMPLANT
NDL HYPO 18GX1.5 BLUNT FILL (NEEDLE) IMPLANT
NDL SPNL 18GX3.5 QUINCKE PK (NEEDLE) ×1 IMPLANT
NEEDLE HYPO 18GX1.5 BLUNT FILL (NEEDLE) IMPLANT
NEEDLE HYPO 25X1 1.5 SAFETY (NEEDLE) ×2 IMPLANT
NEEDLE SPNL 18GX3.5 QUINCKE PK (NEEDLE) ×2 IMPLANT
NS IRRIG 1000ML POUR BTL (IV SOLUTION) ×2 IMPLANT
OIL CARTRIDGE MAESTRO DRILL (MISCELLANEOUS) ×2
PACK LAMINECTOMY NEURO (CUSTOM PROCEDURE TRAY) ×2 IMPLANT
PAD ARMBOARD 7.5X6 YLW CONV (MISCELLANEOUS) ×5 IMPLANT
SPONGE SURGIFOAM ABS GEL SZ50 (HEMOSTASIS) IMPLANT
SUT VIC AB 0 CT1 18XCR BRD8 (SUTURE) ×1 IMPLANT
SUT VIC AB 0 CT1 8-18 (SUTURE) ×2
SUT VIC AB 2-0 CT1 18 (SUTURE) ×2 IMPLANT
SUT VIC AB 3-0 SH 8-18 (SUTURE) ×2 IMPLANT
SYR 5ML LL (SYRINGE) IMPLANT
TOWEL GREEN STERILE (TOWEL DISPOSABLE) ×2 IMPLANT
TOWEL GREEN STERILE FF (TOWEL DISPOSABLE) ×2 IMPLANT
WATER STERILE IRR 1000ML POUR (IV SOLUTION) ×2 IMPLANT

## 2021-07-05 NOTE — Transfer of Care (Signed)
Immediate Anesthesia Transfer of Care Note  Patient: Larry Robertson  Procedure(s) Performed: Left Thoracic 12 to Lumbar 1 Transpedicular microdiscectomy (Left)  Patient Location: PACU  Anesthesia Type:General  Level of Consciousness: awake, alert  and oriented  Airway & Oxygen Therapy: Patient Spontanous Breathing and Patient connected to face mask oxygen  Post-op Assessment: Report given to RN, Post -op Vital signs reviewed and stable and Patient moving all extremities  Post vital signs: Reviewed and stable  Last Vitals:  Vitals Value Taken Time  BP 134/82 07/05/21 0939  Temp    Pulse 75 07/05/21 0944  Resp 15 07/05/21 0944  SpO2 90 % 07/05/21 0944  Vitals shown include unvalidated device data.  Last Pain:  Vitals:   07/05/21 0608  TempSrc:   PainSc: 8          Complications: No notable events documented.

## 2021-07-05 NOTE — Evaluation (Signed)
Occupational Therapy Evaluation and Discharged Patient Details Name: Larry Robertson MRN: [id number] DOB: 12-20-1940 Today's Date: 07/05/2021    History of Present Illness Left Thoracic 12 to Lumbar 1 Transpedicular microdiscectomy   Clinical Impression   This 80 yo male admitted and underwent above presents to acute OT with all education completed as far as self care while his back heals. Pt was S for bed mobility due to cues needed for sequencing but only the first time, ambulated up and down hallway without AD without issues, does not have steps at home, has dtr with him 24/7. No further OT needs identified nor PT needs identified (made them aware). Acute OT will sign off.    Follow Up Recommendations  No OT follow up;Supervision - Intermittent    Equipment Recommendations  None recommended by OT       Precautions / Restrictions Precautions Precautions: Back Precaution Booklet Issued: Yes (comment) Required Braces or Orthoses:  (none)      Mobility Bed Mobility Overal bed mobility: Needs Assistance Bed Mobility: Rolling;Sidelying to Sit Rolling: Supervision Sidelying to sit: Supervision       General bed mobility comments: VCs for technique first time    Transfers Overall transfer level: Modified independent Equipment used: None                  Balance Overall balance assessment: No apparent balance deficits (not formally assessed)                                         ADL either performed or assessed with clinical judgement   ADL Overall ADL's : Modified independent                                       General ADL Comments: Educated on positions for LBD, toilet transfers, tub transfers, mouth care at sink, using wet wipes for back peri care, not sitting for too long of periods (20-30 minutes building up to an hour)--getting up and walking around.     Vision Patient Visual Report: No change from baseline               Pertinent Vitals/Pain Pain Assessment: 0-10 Pain Score: 8  Pain Location: left side Pain Descriptors / Indicators: Guarding;Sore Pain Intervention(s): Limited activity within patient's tolerance;Monitored during session (not time for pain meds for one more hour per RN (made pt aware))     Hand Dominance Right   Extremity/Trunk Assessment Upper Extremity Assessment Upper Extremity Assessment: Overall WFL for tasks assessed           Communication Communication Communication: No difficulties   Cognition Arousal/Alertness: Awake/alert Behavior During Therapy: WFL for tasks assessed/performed Overall Cognitive Status: Within Functional Limits for tasks assessed                                                Home Living Family/patient expects to be discharged to:: Private residence Living Arrangements: Children Available Help at Discharge: Family;Available 24 hours/day Type of Home: House Home Access: Level entry     Home Layout: One level     Bathroom Shower/Tub: Corporate investment banker: Standard  Home Equipment: Grab bars - tub/shower          Prior Functioning/Environment Level of Independence: Independent                 OT Problem List: Decreased range of motion;Pain      OT Treatment/Interventions:      OT Goals(Current goals can be found in the care plan section) Acute Rehab OT Goals Patient Stated Goal: to go home tomorrow   AM-PAC OT "6 Clicks" Daily Activity     Outcome Measure Help from another person eating meals?: None Help from another person taking care of personal grooming?: None Help from another person toileting, which includes using toliet, bedpan, or urinal?: None Help from another person bathing (including washing, rinsing, drying)?: None Help from another person to put on and taking off regular upper body clothing?: None Help from another person to put on and taking off regular lower  body clothing?: None 6 Click Score: 24   End of Session Equipment Utilized During Treatment: Gait belt Nurse Communication: Patient requests pain meds  Activity Tolerance: Patient tolerated treatment well Patient left: in bed;with call bell/phone within reach  OT Visit Diagnosis: Pain Pain - part of body:  (left side)                Time: 1500-1516 OT Time Calculation (min): 16 min Charges:  OT General Charges $OT Visit: 1 Visit OT Evaluation $OT Eval Moderate Complexity: 1 Mod  Golden Circle, OTR/L Acute [employer] Pager [phone] Office [phone]    Almon Register 07/05/2021, 3:25 PM

## 2021-07-05 NOTE — H&P (Signed)
Patient ID:   [id number] Patient: Larry Robertson  Date of Birth: 05-Apr-1941 Visit Type: Office Visit   Date: 05/27/2021 01:15 PM Provider: Marchia Meiers. Vertell Limber MD   This 80 year old male presents for back pain.  HISTORY OF PRESENT ILLNESS: 1.  back pain  Patient returns to review his thoracic and lumbar MRIs.    The patient's pain has not improved since I last saw him and if anything is worse.  He is currently describing and is 8 to 9/10 and unrelenting.  He says he has to have something done as he has had 2 months of severe pain which is not getting any better.  His MRI shows a significant disc herniation at the T12-L1 level on the left with an extruded disc fragment causing nerve root compression.  It does not appear to be causing significant spinal cord compression.  I do not believe that there is any role for physical therapy or injections to help reduce his severe thoracic radiculopathy.  I have recommended left transpedicular microdiskectomy at the T12-L1 level.  This will be done in conjunction with my partner, Dr. Reatha Armour, who met with the patient and reviewed his imaging.      Medical/Surgical/Interim History Reviewed, no change.  Last detailed document date:03/23/2017.     PAST MEDICAL HISTORY, SURGICAL HISTORY, FAMILY HISTORY, SOCIAL HISTORY AND REVIEW OF SYSTEMS I have reviewed the patient's past medical, surgical, family and social history as well as the comprehensive review of systems as included on the [facility] history form dated 08/16/2018, which I have signed.  Family History: Reviewed, no changes.  Last detailed document date:03/23/2017.   Social History: Reviewed, no changes. Last detailed document date: 03/23/2017.    MEDICATIONS: (added, continued or stopped this visit) Started Medication Directions Instruction Stopped  Cymbalta 60 mg capsule,delayed release take 1 capsule by oral route  every day    Januvia 100 mg  tablet take 1 tablet by oral route  every day    metformin 1,000 mg tablet take 1 tablet by oral route 2 times every day with morning and evening meals    Norvasc 10 mg tablet take 1 tablet by oral route  every day   05/10/2018 Percocet 10 mg-325 mg tablet take 1 tablet by oral route  every 6 hours as needed    Zyloprim 300 mg tablet take 1 tablet by oral route  every day      ALLERGIES: Ingredient Reaction Medication Name Comment GABAPENTIN Rash    Reviewed, no changes.    PHYSICAL EXAM:  Vitals Date Temp F BP Pulse Ht In Wt Lb BMI BSA Pain Score 05/27/2021  133/78 81 72 214.6 29.1  9/10     IMPRESSION:  Large fragment of herniated disc at the T12-L1 level on the left causing significant left T12 radiculopathy  PLAN: Transpedicular left T12-L1 microdiskectomy with removal of the free fragment of herniated disc material.  Risks and benefits were discussed in detail with the patient he wishes to proceed with surgery.  Orders: Instruction(s)/Education: Assessment Instruction I10 Lifestyle education [phone] Dietary management education, guidance, and counseling  Assessment/Plan  # Detail Type Description  1. Assessment Thoracic spine pain (M54.6).     2. 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     3. Assessment Radiculopathy, thoracic region (M54.14).     4. Assessment Radiculopathy, lumbar region (M54.16).     5. Assessment Essential (primary) hypertension (I10).     6. Assessment Body mass index (  BMI) 29.0-29.9, adult (W09.41).  Plan Orders Today's instructions / counseling include(s) Dietary management education, guidance, and counseling. Clinical information/comments: Encouraged patient to eat well balanced diet.       Pain Management Plan Pain Scale: 9/10. Method: Numeric Pain Intensity Scale. Location: back. Onset: 02/14/2017. Duration: varies. Quality:  discomforting. Pain management follow-up plan of care: Patient will continue medication management..              Provider:  Marchia Meiers. Vertell Limber MD  06/01/2021 01:36 PM    Dictation edited by: Marchia Meiers. Vertell Limber    CC Providers: Renato Shin [facility] [place],  [place]  [id number]   Hoy Fallert MD  [address] [place], [place] [address]               Electronically signed by Marchia Meiers. Vertell Limber MD on 06/01/2021 01:36 PM

## 2021-07-05 NOTE — Interval H&P Note (Signed)
History and Physical Interval Note:  07/05/2021 7:24 AM  Larry Robertson  has presented today for surgery, with the diagnosis of Thoracic herniated nucleus pulposus.  The various methods of treatment have been discussed with the patient and family. After consideration of risks, benefits and other options for treatment, the patient has consented to  Procedure(s) with comments: Left Thoracic 12 to Lumbar 1 Transpedicular microdiscectomy (Left) - 3C/RM 21 as a surgical intervention.  The patient's history has been reviewed, patient examined, no change in status, stable for surgery.  I have reviewed the patient's chart and labs.  Questions were answered to the patient's satisfaction.     Peggyann Shoals

## 2021-07-05 NOTE — Anesthesia Procedure Notes (Signed)
Procedure Name: Intubation Date/Time: 07/05/2021 7:40 AM Performed by: Leonor Liv, CRNA Pre-anesthesia Checklist: Patient identified, Emergency Drugs available, Suction available and Patient being monitored Patient Re-evaluated:Patient Re-evaluated prior to induction Oxygen Delivery Method: Circle System Utilized Preoxygenation: Pre-oxygenation with 100% oxygen Induction Type: IV induction Ventilation: Mask ventilation without difficulty Laryngoscope Size: Mac and 4 Grade View: Grade I Tube type: Oral Tube size: 7.5 mm Number of attempts: 1 Airway Equipment and Method: Stylet and Oral airway Placement Confirmation: ETT inserted through vocal cords under direct vision, positive ETCO2 and breath sounds checked- equal and bilateral Secured at: 23 cm Tube secured with: Tape Dental Injury: Teeth and Oropharynx as per pre-operative assessment

## 2021-07-05 NOTE — Op Note (Signed)
07/05/2021  9:38 AM  PATIENT:  Larry Robertson  80 y.o. male  PRE-OPERATIVE DIAGNOSIS:  Thoracic herniated nucleus pulposus, thoracic spine pain, thoracic radiculopathy  POST-OPERATIVE DIAGNOSIS:   Thoracic herniated nucleus pulposus, thoracic spine pain, thoracic radiculopathy  PROCEDURE:  Procedure(s) with comments: Left Thoracic 12 to Lumbar 1 Transpedicular microdiscectomy (Left) - 3C/RM 21  SURGEON:  Surgeon(s) and Role:    Erline Levine, MD - Primary    * Dawley, Theodoro Doing, DO - Assisting  PHYSICIAN ASSISTANT:   ASSISTANTS: Poteat, RN   ANESTHESIA:   general  EBL:  <50 cc  BLOOD ADMINISTERED:none  DRAINS: none   LOCAL MEDICATIONS USED:  MARCAINE    and LIDOCAINE   SPECIMEN:  No Specimen  DISPOSITION OF SPECIMEN:  N/A  COUNTS:  YES  TOURNIQUET:  * No tourniquets in log *  DICTATION: Patient has thoracic radiculopathy at T 12 L 1  due to a left sided disc herniation with radiculopathy and numbness and intractable pain. It was elected to take him to surgery for T 12 L 1 laminectomy and transpedicular discectomy as well as a left foraminotomy at T 12 L 1  for thoracic radiculopathy.    Procedure: Patient was brought to the operating room and following the smooth and uncomplicated induction of general endotracheal anesthesia he was placed in a prone position on the Wilson frame.  His back was prepped and draped in the usual sterile fashion with betadine scrub and DuraPrep. Localizing X ray was obtained with 18 gauge spinal needle.  Area of planned incision was infiltrated with local lidocaine. Incision was made in the midline and carried to the lumbodorsal fascia which was incised on the left of midline.  A second radiograph was obtained with marker at the T 12 L 1 intralaminar space. The high speed drill was used to create a laminectomy defect of T 12 L 1 and transpedicular removal of the left L 1 pedicle was also performed. A disc herniation was identified along with  superiorly migrated fragments and the  and the spinal cord dura was defined. The disc space was entered laterally and Epstein curets were used to push the herniated disc material into the interspace, at which point it was removed with various pituitary rongeurs.  The herniated disc material was removed with resultant decompression of the spinal cord.  This was done with a variety of microinstruments under the operating microscope.  At this point it was felt that all neural elements were well decompressed and we felt we had removed the vast majority of disc material compressing the spinal cord and nerve root. A foraminotomy was then performed at the T 12 L 1 level on the left and the exiting nerve root was decompressed as it exited the neural foramen.  The lamina and medial facet were thinned with the high speed drill and removed.  The wounds were then irrigated with saline. Hemostasis was assured with bipolar electrocautery and surgifoam.  The thoracodorsal fascia was closed with 0 Vicryl sutures the subcutaneous tissues reapproximated 2-0 Vicryl inverted sutures and the skin edges were reapproximated with 3-0 Vicryl subcuticular stitch. The wounds were dressed with demabond and occlusive dressings. Patient was extubated in the operating room and taken to recovery in stable and satisfactory condition having tolerated his operation well counts were correct at the end of the case.   PLAN OF CARE: Admit for overnight observation  PATIENT DISPOSITION:  PACU - hemodynamically stable.   Delay start of Pharmacological VTE  agent (>24hrs) due to surgical blood loss or risk of bleeding: yes

## 2021-07-05 NOTE — Progress Notes (Signed)
PT Cancellation/Discharge Note  Patient Details Name: Larry Robertson MRN: [id number] DOB: 1941-03-13   Cancelled Treatment:      OT screened for PT needs.  No acute PT needs identified at this time.  OT provided education and assessed mobility.  See OT evaluation note for full details.  Thanks,  Verdene Lennert, PT, DPT  Acute Rehabilitation Ortho Tech Supervisor [phone] pager #[phone] office      Barbarann Ehlers Baley Lorimer 07/05/2021, 3:21 PM

## 2021-07-05 NOTE — Brief Op Note (Signed)
07/05/2021  9:38 AM  PATIENT:  Larry Robertson  80 y.o. male  PRE-OPERATIVE DIAGNOSIS:  Thoracic herniated nucleus pulposus, thoracic spine pain, thoracic radiculopathy  POST-OPERATIVE DIAGNOSIS:   Thoracic herniated nucleus pulposus, thoracic spine pain, thoracic radiculopathy  PROCEDURE:  Procedure(s) with comments: Left Thoracic 12 to Lumbar 1 Transpedicular microdiscectomy (Left) - 3C/RM 21  SURGEON:  Surgeon(s) and Role:    Erline Levine, MD - Primary    * Dawley, Theodoro Doing, DO - Assisting  PHYSICIAN ASSISTANT:   ASSISTANTS: Poteat, RN   ANESTHESIA:   general  EBL:  <50 cc  BLOOD ADMINISTERED:none  DRAINS: none   LOCAL MEDICATIONS USED:  MARCAINE    and LIDOCAINE   SPECIMEN:  No Specimen  DISPOSITION OF SPECIMEN:  N/A  COUNTS:  YES  TOURNIQUET:  * No tourniquets in log *  DICTATION: Patient has thoracic radiculopathy at T 12 L 1  due to a left sided disc herniation with radiculopathy and numbness and intractable pain. It was elected to take him to surgery for T 12 L 1 laminectomy and transpedicular discectomy as well as a left foraminotomy at T 12 L 1  for thoracic radiculopathy.    Procedure: Patient was brought to the operating room and following the smooth and uncomplicated induction of general endotracheal anesthesia he was placed in a prone position on the Wilson frame.  His back was prepped and draped in the usual sterile fashion with betadine scrub and DuraPrep. Localizing X ray was obtained with 18 gauge spinal needle.  Area of planned incision was infiltrated with local lidocaine. Incision was made in the midline and carried to the lumbodorsal fascia which was incised on the left of midline.  A second radiograph was obtained with marker at the T 12 L 1 intralaminar space. The high speed drill was used to create a laminectomy defect of T 12 L 1 and transpedicular removal of the left L 1 pedicle was also performed. A disc herniation was identified along with  superiorly migrated fragments and the  and the spinal cord dura was defined. The disc space was entered laterally and Epstein curets were used to push the herniated disc material into the interspace, at which point it was removed with various pituitary rongeurs.  The herniated disc material was removed with resultant decompression of the spinal cord.  This was done with a variety of microinstruments under the operating microscope.  At this point it was felt that all neural elements were well decompressed and we felt we had removed the vast majority of disc material compressing the spinal cord and nerve root. A foraminotomy was then performed at the T 12 L 1 level on the left and the exiting nerve root was decompressed as it exited the neural foramen.  The lamina and medial facet were thinned with the high speed drill and removed.  The wounds were then irrigated with saline. Hemostasis was assured with bipolar electrocautery and surgifoam.  The thoracodorsal fascia was closed with 0 Vicryl sutures the subcutaneous tissues reapproximated 2-0 Vicryl inverted sutures and the skin edges were reapproximated with 3-0 Vicryl subcuticular stitch. The wounds were dressed with demabond and occlusive dressings. Patient was extubated in the operating room and taken to recovery in stable and satisfactory condition having tolerated his operation well counts were correct at the end of the case.   PLAN OF CARE: Admit for overnight observation  PATIENT DISPOSITION:  PACU - hemodynamically stable.   Delay start of Pharmacological VTE  agent (>24hrs) due to surgical blood loss or risk of bleeding: yes

## 2021-07-05 NOTE — Anesthesia Postprocedure Evaluation (Signed)
Anesthesia Post Note  Patient: Larry Robertson  Procedure(s) Performed: Left Thoracic 12 to Lumbar 1 Transpedicular microdiscectomy (Left)     Patient location during evaluation: PACU Anesthesia Type: General Level of consciousness: awake and alert Pain management: pain level controlled Vital Signs Assessment: post-procedure vital signs reviewed and stable Respiratory status: spontaneous breathing, nonlabored ventilation, respiratory function stable and patient connected to nasal cannula oxygen Cardiovascular status: blood pressure returned to baseline and stable Postop Assessment: no apparent nausea or vomiting Anesthetic complications: no   No notable events documented.  Last Vitals:  Vitals:   07/05/21 1042 07/05/21 1112  BP: 140/83 (!) 164/86  Pulse: 71 73  Resp: 19 20  Temp: 36.5 C   SpO2: 95% 99%              [name]

## 2021-07-06 ENCOUNTER — Encounter (HOSPITAL_COMMUNITY): Payer: Self-pay | Admitting: Neurosurgery

## 2021-07-06 DIAGNOSIS — M5415 Radiculopathy, thoracolumbar region: Secondary | ICD-10-CM | POA: Diagnosis not present

## 2021-07-06 DIAGNOSIS — Z7984 Long term (current) use of oral hypoglycemic drugs: Secondary | ICD-10-CM | POA: Diagnosis not present

## 2021-07-06 DIAGNOSIS — M5124 Other intervertebral disc displacement, thoracic region: Secondary | ICD-10-CM | POA: Diagnosis not present

## 2021-07-06 LAB — GLUCOSE, CAPILLARY: Glucose-Capillary: 223 mg/dL — ABNORMAL HIGH (ref 70–99)

## 2021-07-06 MED ORDER — OXYCODONE HCL 10 MG PO TABS: 10.0000 mg | ORAL_TABLET | ORAL | 0 refills | Status: AC | PRN

## 2021-07-06 MED ORDER — PANTOPRAZOLE SODIUM 40 MG PO TBEC: 40.0000 mg | DELAYED_RELEASE_TABLET | Freq: Every day | ORAL | Status: DC

## 2021-07-06 NOTE — Discharge Instructions (Signed)
Wound Care Remove outer dressing in 2-3 days Leave incision open to air. You may shower. Do not scrub directly on incision.  Do not put any creams, lotions, or ointments on incision.  Activity Walk each and every day, increasing distance each day. No lifting greater than 5 lbs.  Avoid bending, arching, and twisting. No driving for 2 weeks; may ride as a passenger locally.  Diet Resume your normal diet.   Call Your Doctor If Any of These Occur Redness, drainage, or swelling at the wound.  Temperature greater than 101 degrees. Severe pain not relieved by pain medication. Incision starts to come apart. Follow Up Appt Call [phone])  for problems.

## 2021-07-06 NOTE — Discharge Summary (Signed)
Physician Discharge Summary  Patient ID: Larry Robertson MRN: [id number] DOB/AGE: 81/08/1941 81 y.o.  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 was admitted and taken to the operating room for an uncomplicated thoracic discetomy. He is voiding, tolerating a regular diet, and ambulating. His wound is clean, dry, and without signs of infection.   Treatments: surgery: Left Thoracic 12 to Lumbar 1 Transpedicular microdiscectomy (Left) - 3C/RM 21   Discharge Exam: Blood pressure 132/69, pulse 79, temperature 97.8 F (36.6 C), temperature source Oral, resp. rate 18, height '6\' 1"'$  (1.854 m), weight 98 kg, SpO2 93 %. General appearance: alert, cooperative, appears stated age, and no distress  Disposition: Discharge disposition: 01-Home or Self Care      Thoracic herniated nucleus pulposus  Allergies as of 07/06/2021       Reactions   Lyrica [pregabalin] Other (See Comments)   Blurred vision   Gabapentin Rash, Other (See Comments)   Diplopia (double vision)        Medication List     STOP taking these medications    cloNIDine 0.1 MG tablet Commonly known as: CATAPRES       TAKE these medications    Accu-Chek Aviva Plus test strip Generic drug: glucose blood USE AS INSTRUCTED ONCE DAILY   allopurinol 100 MG tablet Commonly known as: ZYLOPRIM TAKE 1 TABLET BY MOUTH EVERY DAY What changed:  how much to take how to take this when to take this   amLODipine-olmesartan 10-40 MG tablet Commonly known as: AZOR TAKE 1 TABLET BY MOUTH EVERY DAY   b complex vitamins capsule Take 1 capsule by mouth daily.   colchicine 0.6 MG tablet 1 tablet  every hour as needed for gout, not to exceed 6/day What changed:  how much to take how to take this when to take this   cyclobenzaprine 10 MG tablet Commonly known as: FLEXERIL Take 1 tablet (10 mg total) by mouth 2 (two) times daily as needed for muscle spasms.   Fish Oil 1000 MG Caps Take 2 capsules (2,000 mg total) by mouth daily.   Januvia 100 MG tablet Generic drug: sitaGLIPtin TAKE 1 TABLET BY MOUTH EVERY DAY What changed: how much to take   metFORMIN 1000 MG tablet Commonly known as: GLUCOPHAGE TAKE 1 TABLET BY MOUTH 2 TIMES DAILY WITH A MEAL.   methocarbamol 500 MG tablet Commonly known as: ROBAXIN Take 1 tablet (500 mg total) by mouth every 8 (eight) hours as needed for muscle spasms.   multivitamin with minerals tablet Take 1 tablet by mouth daily.   Oxycodone HCl 10 MG Tabs Take 1 tablet (10 mg total) by mouth every 3 (three) hours as needed for up to 7 days for moderate pain ((score 4 to 6)).   oxyCODONE-acetaminophen 10-325 MG tablet Commonly known as: PERCOCET Take 1 tablet by mouth 3 (three) times daily as needed for pain. What changed: when to take this   repaglinide 2 MG tablet Commonly known as: PRANDIN Take 1 tablet (2 mg total) by mouth 2 (two) times daily before a meal.   traZODone 100 MG tablet Commonly known as: DESYREL Take 100 mg by mouth at bedtime as needed for sleep.         Signed: Ashok Pall  07/06/2021, 11:22 AM

## 2021-07-06 NOTE — Progress Notes (Signed)
Patient alert and oriented, voiding adequately, MAE well with no difficulty. Incision area cdi with no s/s of infection. Patient discharged home per order. Patient and family stated understanding of discharge instructions given. Patient has an appointment with Dr. Vertell Limber.

## 2021-07-06 NOTE — Plan of Care (Signed)
Patient is ready for discharge

## 2021-07-11 ENCOUNTER — Other Ambulatory Visit: Payer: Self-pay | Admitting: Endocrinology

## 2021-07-11 DIAGNOSIS — M7500 Adhesive capsulitis of unspecified shoulder: Secondary | ICD-10-CM | POA: Diagnosis not present

## 2021-07-11 DIAGNOSIS — M5459 Other low back pain: Secondary | ICD-10-CM | POA: Diagnosis not present

## 2021-07-11 DIAGNOSIS — G894 Chronic pain syndrome: Secondary | ICD-10-CM | POA: Diagnosis not present

## 2021-07-11 DIAGNOSIS — M25561 Pain in right knee: Secondary | ICD-10-CM | POA: Diagnosis not present

## 2021-08-10 ENCOUNTER — Other Ambulatory Visit: Payer: Self-pay | Admitting: Family Medicine

## 2021-08-10 ENCOUNTER — Other Ambulatory Visit: Payer: Self-pay | Admitting: Endocrinology

## 2021-08-10 DIAGNOSIS — I10 Essential (primary) hypertension: Secondary | ICD-10-CM

## 2021-08-12 ENCOUNTER — Other Ambulatory Visit: Payer: Self-pay

## 2021-08-12 ENCOUNTER — Ambulatory Visit (INDEPENDENT_AMBULATORY_CARE_PROVIDER_SITE_OTHER): Payer: Medicare Other | Admitting: Family Medicine

## 2021-08-12 ENCOUNTER — Encounter: Payer: Self-pay | Admitting: Family Medicine

## 2021-08-12 VITALS — BP 136/74 | HR 74 | Temp 98.1°F | Ht 73.0 in | Wt 213.6 lb

## 2021-08-12 DIAGNOSIS — Z23 Encounter for immunization: Secondary | ICD-10-CM

## 2021-08-12 DIAGNOSIS — E7800 Pure hypercholesterolemia, unspecified: Secondary | ICD-10-CM | POA: Diagnosis not present

## 2021-08-12 DIAGNOSIS — Z0000 Encounter for general adult medical examination without abnormal findings: Secondary | ICD-10-CM

## 2021-08-12 DIAGNOSIS — N1832 Chronic kidney disease, stage 3b: Secondary | ICD-10-CM

## 2021-08-12 DIAGNOSIS — I10 Essential (primary) hypertension: Secondary | ICD-10-CM | POA: Diagnosis not present

## 2021-08-12 DIAGNOSIS — E782 Mixed hyperlipidemia: Secondary | ICD-10-CM

## 2021-08-12 DIAGNOSIS — M109 Gout, unspecified: Secondary | ICD-10-CM | POA: Diagnosis not present

## 2021-08-12 LAB — URINALYSIS, ROUTINE W REFLEX MICROSCOPIC
Bilirubin Urine: NEGATIVE
Hgb urine dipstick: NEGATIVE
Ketones, ur: NEGATIVE
Leukocytes,Ua: NEGATIVE
Nitrite: NEGATIVE
RBC / HPF: NONE SEEN (ref 0–?)
Specific Gravity, Urine: 1.01 (ref 1.000–1.030)
Total Protein, Urine: NEGATIVE
Urine Glucose: NEGATIVE
Urobilinogen, UA: 0.2 (ref 0.0–1.0)
pH: 6 (ref 5.0–8.0)

## 2021-08-12 LAB — URIC ACID: Uric Acid, Serum: 6.1 mg/dL (ref 4.0–7.8)

## 2021-08-12 LAB — COMPREHENSIVE METABOLIC PANEL
ALT: 21 U/L (ref 0–53)
AST: 16 U/L (ref 0–37)
Albumin: 4.5 g/dL (ref 3.5–5.2)
Alkaline Phosphatase: 44 U/L (ref 39–117)
BUN: 14 mg/dL (ref 6–23)
CO2: 29 mEq/L (ref 19–32)
Calcium: 9.6 mg/dL (ref 8.4–10.5)
Chloride: 99 mEq/L (ref 96–112)
Creatinine, Ser: 0.9 mg/dL (ref 0.40–1.50)
GFR: 80.17 mL/min (ref >60.00)
Glucose, Bld: 174 mg/dL — ABNORMAL HIGH (ref 70–99)
Potassium: 4.2 mEq/L (ref 3.5–5.1)
Sodium: 137 mEq/L (ref 135–145)
Total Bilirubin: 0.7 mg/dL (ref 0.2–1.2)
Total Protein: 6.7 g/dL (ref 6.0–8.3)

## 2021-08-12 LAB — CBC
HCT: 40 % (ref 39.0–52.0)
Hemoglobin: 13.4 g/dL (ref 13.0–17.0)
MCHC: 33.6 g/dL (ref 30.0–36.0)
MCV: 89.2 fl (ref 78.0–100.0)
Platelets: 192 10*3/uL (ref 150.0–400.0)
RBC: 4.48 Mil/uL (ref 4.22–5.81)
RDW: 13 % (ref 11.5–15.5)
WBC: 6.1 10*3/uL (ref 4.0–10.5)

## 2021-08-12 LAB — LIPID PANEL
Cholesterol: 187 mg/dL (ref 0–200)
HDL: 40.4 mg/dL (ref >39.00)
NonHDL: 146.67
Total CHOL/HDL Ratio: 5
Triglycerides: 275 mg/dL — ABNORMAL HIGH (ref 0.0–149.0)
VLDL: 55 mg/dL — ABNORMAL HIGH (ref 0.0–40.0)

## 2021-08-12 LAB — LDL CHOLESTEROL, DIRECT: Direct LDL: 109 mg/dL

## 2021-08-12 NOTE — Progress Notes (Addendum)
Established Patient Office Visit  Subjective:  Patient ID: Larry Robertson, male    DOB: 1941-05-02  Age: 80 y.o. MRN: [id number]  CC:  Chief Complaint  Patient presents with   Follow-up    6 month follow up, no concerns. Patient fasting for labs.     HPI Larry Robertson presents for follow-up of hypertension, elevated cholesterol and triglycerides, and history of gouty arthritis with elevated uric acid treated currently with allopurinol 100 mg.  No recent gouty attacks.  Status post T12-L1 microdiscectomy and did improve his side pain.  Says the pain in his legs is better than it was.  Continues follow-up with Dr. Vertell Limber.  Blood pressures been well controlled with Azor.  He is fasting with his lipid profile this morning.  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   DIABETES MELLITUS, TYPE II 99991111   Diastolic dysfunction    on echo   Diverticulosis    ERECTILE DYSFUNCTION 08/25/2007   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   Heart murmur    but was not stated by DR Hilty   Hepatitis    took treatment   HEPATITIS C 08/25/2007   HYPERTENSION 08/25/2007   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   no per pt   HYPOGONADISM 09/27/2009    Past Surgical History:  Procedure Laterality Date   ABDOMINAL HERNIA REPAIR  2000   ANTERIOR CERVICAL DECOMP/DISCECTOMY FUSION Right 03/04/2018   Procedure: Cervical Three-Four, Cervical Six-Seven Right Side approach, Anterior cervical decompression/discectomy/fusion;  Surgeon: Erline Levine, MD;  Location: [facility];  Service: Neurosurgery;  Laterality: Right;  right side anterior approach   CHOLECYSTECTOMY  1999   COLONOSCOPY     LUMBAR FUSION  2005   LUMBAR LAMINECTOMY/DECOMPRESSION MICRODISCECTOMY Left 07/05/2021   Procedure: Left Thoracic 12 to Lumbar 1  Transpedicular microdiscectomy;  Surgeon: Erline Levine, MD;  Location: [facility];  Service: Neurosurgery;  Laterality: Left;  3C/RM 21   TONSILLECTOMY AND ADENOIDECTOMY     UPPER GASTROINTESTINAL ENDOSCOPY      Family History  Problem Relation Age of Onset   Stroke Mother    Diabetes Maternal Grandmother    Cancer Neg Hx    Colon cancer Neg Hx    Esophageal cancer Neg Hx    Rectal cancer Neg Hx    Stomach cancer Neg Hx     Social History   Socioeconomic History   Marital status: Single    Spouse name: Not on file   Number of children: 7   Years of education: Not on file   Highest education level: Not on file  Occupational History   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   Drug use: No   Sexual activity: Not on file  Other Topics Concern   Not on file  Social History Narrative   Divorced   Social Determinants of Health   Financial Resource Strain: Not on file  Food Insecurity: Not on file  Transportation Needs: Not on file  Physical Activity: Not on file  Stress: Not on  file  Social Connections: Not on file  Intimate Partner Violence: Not on file    Outpatient Medications Prior to Visit  Medication Sig Dispense Refill   ACCU-CHEK AVIVA PLUS test strip USE AS INSTRUCTED ONCE DAILY 50 strip 7   allopurinol (ZYLOPRIM) 100 MG tablet TAKE 1 TABLET BY MOUTH EVERY DAY 90 tablet 3   amLODipine-olmesartan (AZOR) 10-40 MG tablet TAKE 1 TABLET BY MOUTH EVERY DAY 90 tablet 1   b complex vitamins capsule Take 1 capsule by mouth daily. 90 capsule 1   colchicine 0.6 MG tablet 1 tablet every hour as needed for gout, not to exceed 6/day 6 tablet 4   cyclobenzaprine (FLEXERIL) 10 MG tablet Take 1 tablet (10 mg total) by mouth 2 (two) times daily as needed for  muscle spasms. 20 tablet 0   JANUVIA 100 MG tablet TAKE 1 TABLET BY MOUTH EVERY DAY 90 tablet 3   metFORMIN (GLUCOPHAGE) 1000 MG tablet TAKE 1 TABLET BY MOUTH 2 TIMES DAILY WITH A MEAL. 180 tablet 1   Multiple Vitamins-Minerals (MULTIVITAMIN WITH MINERALS) tablet Take 1 tablet by mouth daily.     Omega-3 Fatty Acids (FISH OIL) 1000 MG CAPS Take 2 capsules (2,000 mg total) by mouth daily. 180 capsule 1   oxyCODONE-acetaminophen (PERCOCET) 10-325 MG tablet Take 1 tablet by mouth 3 (three) times daily as needed for pain. 90 tablet 0   repaglinide (PRANDIN) 2 MG tablet Take 1 tablet (2 mg total) by mouth 2 (two) times daily before a meal. 180 tablet 3   methocarbamol (ROBAXIN) 500 MG tablet Take 1 tablet (500 mg total) by mouth every 8 (eight) hours as needed for muscle spasms. 20 tablet 0   traZODone (DESYREL) 100 MG tablet Take 100 mg by mouth at bedtime as needed for sleep.     No facility-administered medications prior to visit.    Allergies  Allergen Reactions   Lyrica [Pregabalin] Other (See Comments)    Blurred vision   Gabapentin Rash and Other (See Comments)    Diplopia (double vision)    ROS Review of Systems    Objective:    Physical Exam  BP 136/74 (BP Location: Right Arm, Patient Position: Sitting, Cuff Size: Normal)   Pulse 74   Temp 98.1 F (36.7 C) (Temporal)   Ht '6\' 1"'$  (1.854 m)   Wt 213 lb 9.6 oz (96.9 kg)   SpO2 95%   BMI 28.18 kg/m  Wt Readings from Last 3 Encounters:  08/12/21 213 lb 9.6 oz (96.9 kg)  07/05/21 216 lb (98 kg)  07/02/21 212 lb 12.8 oz (96.5 kg)     Health Maintenance Due  Topic Date Due   Zoster Vaccines- Shingrix (1 of 2) Never done   OPHTHALMOLOGY EXAM  08/31/2020    There are no preventive care reminders to display for this patient.  Lab Results  Component Value Date   TSH 3.38 09/15/2019   Lab Results  Component Value Date   WBC 6.1 08/12/2021   HGB 13.4 08/12/2021   HCT 40.0 08/12/2021   MCV 89.2 08/12/2021   PLT  192.0 08/12/2021   Lab Results  Component Value Date   NA 137 08/12/2021   K 4.2 08/12/2021   CO2 29 08/12/2021   GLUCOSE 174 (H) 08/12/2021   BUN 14 08/12/2021   CREATININE 0.90 08/12/2021   BILITOT 0.7 08/12/2021   ALKPHOS 44 08/12/2021   AST 16 08/12/2021   ALT 21 08/12/2021   PROT 6.7 08/12/2021   ALBUMIN  4.5 08/12/2021   CALCIUM 9.6 08/12/2021   ANIONGAP 7 07/02/2021   GFR 80.17 08/12/2021   Lab Results  Component Value Date   CHOL 187 08/12/2021   Lab Results  Component Value Date   HDL 40.40 08/12/2021   Lab Results  Component Value Date   LDLCALC 39 01/21/2018   Lab Results  Component Value Date   TRIG 275.0 (H) 08/12/2021   Lab Results  Component Value Date   CHOLHDL 5 08/12/2021   Lab Results  Component Value Date   HGBA1C 7.4 (H) 07/02/2021      Assessment & Plan:   Problem List Items Addressed This Visit       Cardiovascular and Mediastinum   Essential hypertension   Relevant Medications   atorvastatin (LIPITOR) 20 MG tablet   Other Relevant Orders   Comprehensive metabolic panel (Completed)     Musculoskeletal and Integument   Gouty arthropathy   Relevant Orders   CBC (Completed)   Uric acid (Completed)   Urinalysis, Routine w reflex microscopic (Completed)     Genitourinary   Stage 3b chronic kidney disease (HCC)   Relevant Orders   Comprehensive metabolic panel (Completed)     Other   Healthcare maintenance   Relevant Orders   Flu vaccine HIGH DOSE PF (Fluzone High dose) (Completed)   Elevated cholesterol   Relevant Medications   atorvastatin (LIPITOR) 20 MG tablet   Other Relevant Orders   Lipid panel (Completed)   Elevated triglycerides with high cholesterol - Primary   Relevant Medications   atorvastatin (LIPITOR) 20 MG tablet   Other Relevant Orders   Comprehensive metabolic panel (Completed)   Lipid panel (Completed)    Meds ordered this encounter  Medications   atorvastatin (LIPITOR) 20 MG tablet    Sig:  Take 1 tablet (20 mg total) by mouth daily.    Dispense:  90 tablet    Refill:  3     Follow-up: Return in about 6 months (around 02/09/2022), or if symptoms worsen or fail to improve.   Continue Azor and allopurinol.  Recommendation to treat elevated cholesterol and triglycerides pending today's labs results.  Happy that inside pain is significantly relieved. Libby Maw, MD

## 2021-08-13 MED ORDER — ATORVASTATIN CALCIUM 20 MG PO TABS: 20.0000 mg | ORAL_TABLET | Freq: Every day | ORAL | 3 refills | Status: DC

## 2021-08-13 NOTE — Addendum Note (Signed)
Addended by: Jon Billings on: 08/13/2021 08:01 AM   Modules accepted: Orders

## 2021-09-04 ENCOUNTER — Other Ambulatory Visit: Payer: Self-pay

## 2021-09-04 ENCOUNTER — Ambulatory Visit (INDEPENDENT_AMBULATORY_CARE_PROVIDER_SITE_OTHER): Payer: Medicare Other | Admitting: Endocrinology

## 2021-09-04 VITALS — BP 148/80 | HR 73 | Ht 73.0 in | Wt 213.4 lb

## 2021-09-04 DIAGNOSIS — E119 Type 2 diabetes mellitus without complications: Secondary | ICD-10-CM

## 2021-09-04 LAB — POCT GLYCOSYLATED HEMOGLOBIN (HGB A1C): HbA1c, POC (prediabetic range): 6.8 % — AB (ref 5.7–6.4)

## 2021-09-04 NOTE — Progress Notes (Signed)
Subjective:    Patient ID: Larry Robertson, male    DOB: [birth year], 80 y.o.   MRN: [id number]  HPI Pt returns for f/u of diabetes mellitus:  DM type: 2 Dx'ed: 2010.  Complications: CRI Therapy: 3 oral meds.  DKA: never.  Severe hypoglycemia: never.  Pancreatitis: never.  Other: edema and nocturia (he no longer takes d-DAVP), limit oral rx options; he has never taken insulin.   Interval history: no cbg record, but states cbg's are well-controlled.  he takes meds as rx'ed.  No recent steroids.   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   DIABETES MELLITUS, TYPE II 73/41/9379   Diastolic dysfunction    on echo   Diverticulosis    ERECTILE DYSFUNCTION 08/25/2007   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   Heart murmur    but was not stated by DR Hilty   Hepatitis    took treatment   HEPATITIS C 08/25/2007   HYPERTENSION 08/25/2007   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   no per pt   HYPOGONADISM 09/27/2009    Past Surgical History:  Procedure Laterality Date   ABDOMINAL HERNIA REPAIR  2000   ANTERIOR CERVICAL DECOMP/DISCECTOMY FUSION Right 03/04/2018   Procedure: Cervical Three-Four, Cervical Six-Seven Right Side approach, Anterior cervical decompression/discectomy/fusion;  Surgeon: Erline Levine, MD;  Location: [facility];  Service: Neurosurgery;  Laterality: Right;  right side anterior approach   CHOLECYSTECTOMY  1999   COLONOSCOPY     LUMBAR FUSION  2005   LUMBAR LAMINECTOMY/DECOMPRESSION MICRODISCECTOMY Left 07/05/2021   Procedure: Left Thoracic 12 to Lumbar 1 Transpedicular microdiscectomy;  Surgeon: Erline Levine, MD;  Location: [facility];  Service: Neurosurgery;  Laterality: Left;  3C/RM 21   TONSILLECTOMY AND ADENOIDECTOMY     UPPER GASTROINTESTINAL ENDOSCOPY      Social History    Socioeconomic History   Marital status: Single    Spouse name: Not on file   Number of children: 7   Years of education: Not on file   Highest education level: Not on file  Occupational History   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   Drug use: No   Sexual activity: Not on file  Other Topics Concern   Not on file  Social History Narrative   Divorced   Social Determinants of Health   Financial Resource Strain: Not on file  Food Insecurity: Not on file  Transportation Needs: Not on file  Physical Activity: Not on file  Stress: Not on file  Social Connections: Not on file  Intimate Partner Violence: Not on file    Current Outpatient Medications on File Prior to Visit  Medication Sig Dispense Refill   ACCU-CHEK AVIVA PLUS test strip USE AS INSTRUCTED ONCE DAILY 50 strip 7   allopurinol (ZYLOPRIM) 100 MG tablet TAKE 1 TABLET BY MOUTH EVERY DAY 90 tablet 3   amLODipine-olmesartan (AZOR) 10-40 MG tablet TAKE 1 TABLET BY MOUTH EVERY DAY 90 tablet 1   atorvastatin (LIPITOR) 20 MG tablet Take 1 tablet (20 mg total) by mouth  daily. 90 tablet 3   b complex vitamins capsule Take 1 capsule by mouth daily. 90 capsule 1   colchicine 0.6 MG tablet 1 tablet every hour as needed for gout, not to exceed 6/day 6 tablet 4   cyclobenzaprine (FLEXERIL) 10 MG tablet Take 1 tablet (10 mg total) by mouth 2 (two) times daily as needed for muscle spasms. 20 tablet 0   JANUVIA 100 MG tablet TAKE 1 TABLET BY MOUTH EVERY DAY 90 tablet 3   metFORMIN (GLUCOPHAGE) 1000 MG tablet TAKE 1 TABLET BY MOUTH 2 TIMES DAILY WITH A MEAL. 180 tablet 1   Multiple Vitamins-Minerals (MULTIVITAMIN WITH MINERALS) tablet Take 1 tablet by mouth daily.     Omega-3 Fatty Acids (FISH OIL) 1000 MG  CAPS Take 2 capsules (2,000 mg total) by mouth daily. 180 capsule 1   oxyCODONE-acetaminophen (PERCOCET) 10-325 MG tablet Take 1 tablet by mouth 3 (three) times daily as needed for pain. 90 tablet 0   repaglinide (PRANDIN) 2 MG tablet Take 1 tablet (2 mg total) by mouth 2 (two) times daily before a meal. 180 tablet 3   No current facility-administered medications on file prior to visit.    Allergies  Allergen Reactions   Lyrica [Pregabalin] Other (See Comments)    Blurred vision   Gabapentin Rash and Other (See Comments)    Diplopia (double vision)    Family History  Problem Relation Age of Onset   Stroke Mother    Diabetes Maternal Grandmother    Cancer Neg Hx    Colon cancer Neg Hx    Esophageal cancer Neg Hx    Rectal cancer Neg Hx    Stomach cancer Neg Hx     BP (!) 148/80 (BP Location: Right Arm, Patient Position: Sitting, Cuff Size: Large)   Pulse 73   Ht 6\' 1"  (1.854 m)   Wt 213 lb 6.4 oz (96.8 kg)   SpO2 95%   BMI 28.15 kg/m    Review of Systems He denies hypoglycemia    Objective:   Physical Exam VITAL SIGNS:  See vs page GENERAL: no distress Pulses: dorsalis pedis intact bilat.   MSK: no deformity of the feet CV: trace bilat leg edema.   Skin:  no ulcer on the feet.  normal color and temp on the feet.  Neuro: sensation is intact to touch on the feet.    Lab Results  Component Value Date   CREATININE 0.90 08/12/2021   BUN 14 08/12/2021   NA 137 08/12/2021   K 4.2 08/12/2021   CL 99 08/12/2021   CO2 29 08/12/2021   Lab Results  Component Value Date   HGBA1C 6.8 (A) 09/04/2021      Assessment & Plan:  Type 2 DM: well-controlled  Patient Instructions  Please continue the same 3 diabetes medications.  check your blood sugar once a day.  vary the time of day when you check, between before the 3 meals, and at bedtime.  also check if you have symptoms of your blood sugar being too high or too low.  please keep a record of the readings and bring it  to your next appointment here (or you can bring the meter itself).  You can write it on any piece of paper.  please call us sooner if your blood sugar goes below 70, or if you have a lot of readings over 200.   Please come back for a follow-up appointment in 4-6 months.

## 2021-09-04 NOTE — Patient Instructions (Addendum)
Please continue the same 3 diabetes medications.  check your blood sugar once a day.  vary the time of day when you check, between before the 3 meals, and at bedtime.  also check if you have symptoms of your blood sugar being too high or too low.  please keep a record of the readings and bring it to your next appointment here (or you can bring the meter itself).  You can write it on any piece of paper.  please call us sooner if your blood sugar goes below 70, or if you have a lot of readings over 200.   Please come back for a follow-up appointment in 4-6 months.

## 2021-09-14 ENCOUNTER — Inpatient Hospital Stay (HOSPITAL_COMMUNITY): Payer: Medicare Other

## 2021-09-14 ENCOUNTER — Inpatient Hospital Stay (HOSPITAL_COMMUNITY)
Admission: EM | Admit: 2021-09-14 | Discharge: 2021-09-17 | DRG: 061 | Disposition: A | Discharge status: Rehabilitation Facility | Payer: Medicare Other | Attending: Neurology | Admitting: Neurology

## 2021-09-14 ENCOUNTER — Emergency Department (HOSPITAL_COMMUNITY): Payer: Medicare Other

## 2021-09-14 ENCOUNTER — Other Ambulatory Visit: Payer: Self-pay

## 2021-09-14 ENCOUNTER — Encounter (HOSPITAL_COMMUNITY): Payer: Self-pay | Admitting: Neurology

## 2021-09-14 DIAGNOSIS — Z20822 Contact with and (suspected) exposure to covid-19: Secondary | ICD-10-CM | POA: Diagnosis present

## 2021-09-14 DIAGNOSIS — E1122 Type 2 diabetes mellitus with diabetic chronic kidney disease: Secondary | ICD-10-CM | POA: Diagnosis present

## 2021-09-14 DIAGNOSIS — R4781 Slurred speech: Secondary | ICD-10-CM | POA: Diagnosis present

## 2021-09-14 DIAGNOSIS — M109 Gout, unspecified: Secondary | ICD-10-CM | POA: Diagnosis present

## 2021-09-14 DIAGNOSIS — Z7984 Long term (current) use of oral hypoglycemic drugs: Secondary | ICD-10-CM | POA: Diagnosis not present

## 2021-09-14 DIAGNOSIS — I639 Cerebral infarction, unspecified: Secondary | ICD-10-CM | POA: Diagnosis present

## 2021-09-14 DIAGNOSIS — M25511 Pain in right shoulder: Secondary | ICD-10-CM

## 2021-09-14 DIAGNOSIS — M25561 Pain in right knee: Secondary | ICD-10-CM

## 2021-09-14 DIAGNOSIS — E119 Type 2 diabetes mellitus without complications: Secondary | ICD-10-CM | POA: Diagnosis not present

## 2021-09-14 DIAGNOSIS — Z8619 Personal history of other infectious and parasitic diseases: Secondary | ICD-10-CM | POA: Diagnosis not present

## 2021-09-14 DIAGNOSIS — M25461 Effusion, right knee: Secondary | ICD-10-CM | POA: Diagnosis not present

## 2021-09-14 DIAGNOSIS — K219 Gastro-esophageal reflux disease without esophagitis: Secondary | ICD-10-CM

## 2021-09-14 DIAGNOSIS — R29710 NIHSS score 10: Secondary | ICD-10-CM | POA: Diagnosis present

## 2021-09-14 DIAGNOSIS — I509 Heart failure, unspecified: Secondary | ICD-10-CM

## 2021-09-14 DIAGNOSIS — G8191 Hemiplegia, unspecified affecting right dominant side: Secondary | ICD-10-CM | POA: Diagnosis present

## 2021-09-14 DIAGNOSIS — R531 Weakness: Secondary | ICD-10-CM | POA: Diagnosis not present

## 2021-09-14 DIAGNOSIS — I6329 Cerebral infarction due to unspecified occlusion or stenosis of other precerebral arteries: Principal | ICD-10-CM | POA: Diagnosis present

## 2021-09-14 DIAGNOSIS — G894 Chronic pain syndrome: Secondary | ICD-10-CM | POA: Diagnosis present

## 2021-09-14 DIAGNOSIS — Z833 Family history of diabetes mellitus: Secondary | ICD-10-CM | POA: Diagnosis not present

## 2021-09-14 DIAGNOSIS — R1310 Dysphagia, unspecified: Secondary | ICD-10-CM | POA: Diagnosis present

## 2021-09-14 DIAGNOSIS — R29810 Facial weakness: Secondary | ICD-10-CM | POA: Diagnosis present

## 2021-09-14 DIAGNOSIS — N182 Chronic kidney disease, stage 2 (mild): Secondary | ICD-10-CM | POA: Diagnosis present

## 2021-09-14 DIAGNOSIS — I160 Hypertensive urgency: Secondary | ICD-10-CM | POA: Diagnosis present

## 2021-09-14 DIAGNOSIS — R1312 Dysphagia, oropharyngeal phase: Secondary | ICD-10-CM | POA: Diagnosis not present

## 2021-09-14 DIAGNOSIS — Z823 Family history of stroke: Secondary | ICD-10-CM | POA: Diagnosis not present

## 2021-09-14 DIAGNOSIS — Z981 Arthrodesis status: Secondary | ICD-10-CM | POA: Diagnosis not present

## 2021-09-14 DIAGNOSIS — I129 Hypertensive chronic kidney disease with stage 1 through stage 4 chronic kidney disease, or unspecified chronic kidney disease: Secondary | ICD-10-CM | POA: Diagnosis present

## 2021-09-14 DIAGNOSIS — Z79899 Other long term (current) drug therapy: Secondary | ICD-10-CM | POA: Diagnosis not present

## 2021-09-14 DIAGNOSIS — K222 Esophageal obstruction: Secondary | ICD-10-CM

## 2021-09-14 DIAGNOSIS — Z888 Allergy status to other drugs, medicaments and biological substances status: Secondary | ICD-10-CM

## 2021-09-14 DIAGNOSIS — I6381 Other cerebral infarction due to occlusion or stenosis of small artery: Secondary | ICD-10-CM | POA: Diagnosis present

## 2021-09-14 DIAGNOSIS — E781 Pure hyperglyceridemia: Secondary | ICD-10-CM | POA: Diagnosis present

## 2021-09-14 DIAGNOSIS — Z85828 Personal history of other malignant neoplasm of skin: Secondary | ICD-10-CM | POA: Diagnosis not present

## 2021-09-14 DIAGNOSIS — M1990 Unspecified osteoarthritis, unspecified site: Secondary | ICD-10-CM | POA: Diagnosis present

## 2021-09-14 DIAGNOSIS — E11649 Type 2 diabetes mellitus with hypoglycemia without coma: Secondary | ICD-10-CM | POA: Diagnosis not present

## 2021-09-14 DIAGNOSIS — E538 Deficiency of other specified B group vitamins: Secondary | ICD-10-CM

## 2021-09-14 DIAGNOSIS — I6389 Other cerebral infarction: Secondary | ICD-10-CM | POA: Diagnosis not present

## 2021-09-14 DIAGNOSIS — I10 Essential (primary) hypertension: Secondary | ICD-10-CM | POA: Diagnosis not present

## 2021-09-14 DIAGNOSIS — R1319 Other dysphagia: Secondary | ICD-10-CM | POA: Diagnosis present

## 2021-09-14 DIAGNOSIS — I4891 Unspecified atrial fibrillation: Secondary | ICD-10-CM | POA: Diagnosis present

## 2021-09-14 DIAGNOSIS — M10061 Idiopathic gout, right knee: Secondary | ICD-10-CM | POA: Diagnosis not present

## 2021-09-14 DIAGNOSIS — R58 Hemorrhage, not elsewhere classified: Secondary | ICD-10-CM

## 2021-09-14 LAB — COMPREHENSIVE METABOLIC PANEL
ALT: 36 U/L (ref 0–44)
AST: 60 U/L — ABNORMAL HIGH (ref 15–41)
Albumin: 4.2 g/dL (ref 3.5–5.0)
Alkaline Phosphatase: 47 U/L (ref 38–126)
Anion gap: 14 (ref 5–15)
BUN: 17 mg/dL (ref 8–23)
CO2: 20 mmol/L — ABNORMAL LOW (ref 22–32)
Calcium: 9.1 mg/dL (ref 8.9–10.3)
Chloride: 99 mmol/L (ref 98–111)
Creatinine, Ser: 0.84 mg/dL (ref 0.61–1.24)
GFR, Estimated: >60 mL/min (ref >60)
Glucose, Bld: 161 mg/dL — ABNORMAL HIGH (ref 70–99)
Potassium: 3.6 mmol/L (ref 3.5–5.1)
Sodium: 133 mmol/L — ABNORMAL LOW (ref 135–145)
Total Bilirubin: 1.5 mg/dL — ABNORMAL HIGH (ref 0.3–1.2)
Total Protein: 6.8 g/dL (ref 6.5–8.1)

## 2021-09-14 LAB — I-STAT CHEM 8, ED
BUN: 19 mg/dL (ref 8–23)
Calcium, Ion: 0.97 mmol/L — ABNORMAL LOW (ref 1.15–1.40)
Chloride: 103 mmol/L (ref 98–111)
Creatinine, Ser: 0.6 mg/dL — ABNORMAL LOW (ref 0.61–1.24)
Glucose, Bld: 169 mg/dL — ABNORMAL HIGH (ref 70–99)
HCT: 49 % (ref 39.0–52.0)
Hemoglobin: 16.7 g/dL (ref 13.0–17.0)
Potassium: 3.9 mmol/L (ref 3.5–5.1)
Sodium: 132 mmol/L — ABNORMAL LOW (ref 135–145)
TCO2: 19 mmol/L — ABNORMAL LOW (ref 22–32)

## 2021-09-14 LAB — RESP PANEL BY RT-PCR (FLU A&B, COVID) ARPGX2
Influenza A by PCR: NEGATIVE
Influenza B by PCR: NEGATIVE
SARS Coronavirus 2 by RT PCR: NEGATIVE

## 2021-09-14 LAB — DIFFERENTIAL
Abs Immature Granulocytes: 0.32 10*3/uL — ABNORMAL HIGH (ref 0.00–0.07)
Basophils Absolute: 0.1 10*3/uL (ref 0.0–0.1)
Basophils Relative: 1 %
Eosinophils Absolute: 0 10*3/uL (ref 0.0–0.5)
Eosinophils Relative: 0 %
Immature Granulocytes: 2 %
Lymphocytes Relative: 7 %
Lymphs Abs: 1.2 10*3/uL (ref 0.7–4.0)
Monocytes Absolute: 1.1 10*3/uL — ABNORMAL HIGH (ref 0.1–1.0)
Monocytes Relative: 6 %
Neutro Abs: 14.9 10*3/uL — ABNORMAL HIGH (ref 1.7–7.7)
Neutrophils Relative %: 84 %

## 2021-09-14 LAB — CBC
HCT: 45.5 % (ref 39.0–52.0)
Hemoglobin: 14.9 g/dL (ref 13.0–17.0)
MCH: 29.6 pg (ref 26.0–34.0)
MCHC: 32.7 g/dL (ref 30.0–36.0)
MCV: 90.3 fL (ref 80.0–100.0)
Platelets: 238 10*3/uL (ref 150–400)
RBC: 5.04 MIL/uL (ref 4.22–5.81)
RDW: 12.6 % (ref 11.5–15.5)
WBC: 17.6 10*3/uL — ABNORMAL HIGH (ref 4.0–10.5)
nRBC: 0 % (ref 0.0–0.2)

## 2021-09-14 LAB — APTT: aPTT: 30 seconds (ref 24–36)

## 2021-09-14 LAB — PROTIME-INR
INR: 1.1 (ref 0.8–1.2)
Prothrombin Time: 14.5 seconds (ref 11.4–15.2)

## 2021-09-14 LAB — CBG MONITORING, ED: Glucose-Capillary: 173 mg/dL — ABNORMAL HIGH (ref 70–99)

## 2021-09-14 LAB — TSH: TSH: 1.106 u[IU]/mL (ref 0.350–4.500)

## 2021-09-14 MED ORDER — CLEVIDIPINE BUTYRATE 0.5 MG/ML IV EMUL: 0.0000 mg/h | INTRAVENOUS | Status: DC

## 2021-09-14 MED ORDER — SODIUM CHLORIDE 0.9% FLUSH
3.0000 mL | Freq: Once | INTRAVENOUS | Status: AC
  Administered 2021-09-14: 3 mL via INTRAVENOUS

## 2021-09-14 MED ORDER — TENECTEPLASE FOR STROKE
0.2500 mg/kg | PACK | Freq: Once | INTRAVENOUS | Status: AC
  Administered 2021-09-14: 24 mg via INTRAVENOUS
  Filled 2021-09-14: qty 4.8

## 2021-09-14 MED ORDER — LABETALOL HCL 5 MG/ML IV SOLN
INTRAVENOUS | Status: DC | PRN
  Administered 2021-09-14 (×2): 10 mg via INTRAVENOUS

## 2021-09-14 MED ORDER — ACETAMINOPHEN 325 MG RE SUPP
650.0000 mg | RECTAL | Status: DC | PRN
  Administered 2021-09-15 – 2021-09-16 (×2): 650 mg via RECTAL
  Filled 2021-09-14: qty 2
  Filled 2021-09-14: qty 1
  Filled 2021-09-14: qty 2
  Filled 2021-09-14: qty 1

## 2021-09-14 MED ORDER — ATORVASTATIN CALCIUM 40 MG PO TABS
40.0000 mg | ORAL_TABLET | Freq: Every day | ORAL | Status: DC
  Administered 2021-09-17: 40 mg via ORAL
  Filled 2021-09-14: qty 1

## 2021-09-14 MED ORDER — SODIUM CHLORIDE 0.9 % IV SOLN
INTRAVENOUS | Status: DC
Start: 2021-09-14 — End: 2021-09-18

## 2021-09-14 MED ORDER — IOHEXOL 350 MG/ML SOLN
75.0000 mL | Freq: Once | INTRAVENOUS | Status: AC | PRN
  Administered 2021-09-14: 75 mL via INTRAVENOUS

## 2021-09-14 MED ORDER — CHLORHEXIDINE GLUCONATE CLOTH 2 % EX PADS
6.0000 | MEDICATED_PAD | Freq: Every day | CUTANEOUS | Status: DC
  Administered 2021-09-14 – 2021-09-15 (×2): 6 via TOPICAL

## 2021-09-14 MED ORDER — ACETAMINOPHEN 325 MG PO TABS
650.0000 mg | ORAL_TABLET | ORAL | Status: DC | PRN
  Administered 2021-09-16: 650 mg via ORAL
  Filled 2021-09-14 (×2): qty 2

## 2021-09-14 MED ORDER — IOHEXOL 350 MG/ML SOLN
100.0000 mL | Freq: Once | INTRAVENOUS | Status: AC | PRN
  Administered 2021-09-14: 100 mL via INTRAVENOUS

## 2021-09-14 MED ORDER — SENNOSIDES-DOCUSATE SODIUM 8.6-50 MG PO TABS
1.0000 | ORAL_TABLET | Freq: Two times a day (BID) | ORAL | Status: DC
  Administered 2021-09-16: 1 via ORAL
  Filled 2021-09-14 (×3): qty 1

## 2021-09-14 MED ORDER — PANTOPRAZOLE SODIUM 40 MG IV SOLR
40.0000 mg | Freq: Every day | INTRAVENOUS | Status: DC
Start: 2021-09-14 — End: 2021-09-17
  Administered 2021-09-14 – 2021-09-16 (×3): 40 mg via INTRAVENOUS
  Filled 2021-09-14 (×3): qty 40

## 2021-09-14 MED ORDER — ACETAMINOPHEN 160 MG/5ML PO SOLN: 650.0000 mg | ORAL | Status: DC | PRN

## 2021-09-14 MED ORDER — STROKE: EARLY STAGES OF RECOVERY BOOK
Freq: Once | Status: DC
Start: 2021-09-14 — End: 2021-09-18
  Filled 2021-09-14: qty 1

## 2021-09-14 NOTE — ED Notes (Signed)
EEG at bedside.

## 2021-09-14 NOTE — Progress Notes (Signed)
PHARMACIST CODE STROKE RESPONSE  Notified to mix TNK at 1330 by Dr. Theda Sers Delivered TNK to RN at 1332  TNK dose = 24 mg IV over 5 seconds.   Issues/delays encountered (if applicable): Re-peat BP to ensure safe administration, consent, TNK given @1336   Larry Robertson 09/14/21 1:38 PM

## 2021-09-14 NOTE — Procedures (Signed)
History: 80 yo M with weakness and slurred speech  Sedation: None  Technique: This EEG was acquired with electrodes placed according to the International 10-20 electrode system (including Fp1, Fp2, F3, F4, C3, C4, P3, P4, O1, O2, T3, T4, T5, T6, A1, A2, Fz, Cz, Pz). The following electrodes were missing or displaced: none.   Background: The background consists of intermixed alpha and beta activities. There is a well defined posterior dominant rhythm of 9 Hz that attenuates with eye opening. There is increasing delta with drowsiness. Sleep is recorded with normal appearing structures.   Photic stimulation: Physiologic driving is not performed   EEG Abnormalities: none  Clinical Interpretation: This normal EEG is recorded in the waking and sleep state. There was no seizure or seizure predisposition recorded on this study. Please note that lack of epileptiform activity on EEG does not preclude the possibility of epilepsy.   Roland Rack, MD [facility] [phone]  If 7pm- 7am, please page neurology on call as listed in [facility].

## 2021-09-14 NOTE — H&P (Signed)
Neurology H&P  CC: fall with right facial droop, right sided weakness, and slurred speech.   History is obtained from: EMS, some from patient.   HPI: Larry Robertson is a 80 y.o. male with a PMHx of GERD, DM II, HLD, OA, cancer, gout, Hep C, and HTN. Patient presents via EMS for right facial droop, right sided weakness, and slurred speech. LKW 12 noon per daughter who was with patient all a.m. Patient was trying to go to his car and fell. When daughter saw him, she recognized stroke symptoms and called 911. BP 185/102 NDS cbg 163 en route. No seizure activity was noted, but patient was incontinent of urine.   After brief exam for airway clearance at ED bridge, patient was taken urgently to CT suite. CTH negative for stroke. No LVO or thrombus noted on CTA. Called radiology to verify.   NP reviewed the risk and benefits of TNK with patient and he consented to administration. Labetalol 10mg  IV given x 2 to get patient's BP under 180. Last BP 140s.   No personal history of stroke, but + in mother. No personal history of seizure.   In review of chart, NP does not see any admissions her at [facility] in past 2 years. His LDL was 109 one month ago.   LKW: 1200 noon TNK given? Yes.  IRThrombectomy? No, no LVO.  MRS:   NIHSS:  LOC: 0 Questions:0 Commands: 0 Eye:0 Visual fields: 0 Face:1 LUE: 0 RUE: 4 LLE:0 RLE:4 Ataxia: 0 Sensation: 0 Language: 0 Dysarthria:1 Extinction/Inattention: 0 Total: 0  ROS: A robust ROS was unable to be performed due to emergent nature of event.   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   DIABETES MELLITUS, TYPE II 16/60/6301   Diastolic dysfunction    on echo   Diverticulosis    ERECTILE DYSFUNCTION 08/25/2007   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   Heart murmur     but was not stated by DR Hilty   Hepatitis    took treatment   HEPATITIS C 08/25/2007   HYPERTENSION 08/25/2007   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   no per pt   HYPOGONADISM 09/27/2009   Family History  Problem Relation Age of Onset   Stroke Mother    Diabetes Maternal Grandmother    Cancer Neg Hx    Colon cancer Neg Hx    Esophageal cancer Neg Hx    Rectal cancer Neg Hx    Stomach cancer Neg Hx    Social History:  reports that he has never smoked. He has never used smokeless tobacco. He reports that he does not currently use alcohol. He reports that he does not use drugs.  Prior to Admission medications   Medication Sig Start Date End Date Taking? Authorizing Provider  ACCU-CHEK AVIVA PLUS test strip USE AS INSTRUCTED ONCE DAILY 09/15/20   Renato Shin, MD  allopurinol (ZYLOPRIM) 100 MG tablet TAKE 1 TABLET BY MOUTH EVERY DAY 05/06/21   Libby Maw, MD  amLODipine-olmesartan (AZOR) 10-40 MG tablet TAKE 1 TABLET BY MOUTH EVERY DAY 08/11/21   Libby Maw, MD  atorvastatin (LIPITOR) 20 MG tablet Take 1 tablet (20 mg total) by mouth daily. 08/13/21   Libby Maw, MD  b complex vitamins capsule Take 1 capsule by mouth daily.  04/25/20   Libby Maw, MD  colchicine 0.6 MG tablet 1 tablet every hour as needed for gout, not to exceed 6/day 09/01/18   Renato Shin, MD  cyclobenzaprine (FLEXERIL) 10 MG tablet Take 1 tablet (10 mg total) by mouth 2 (two) times daily as needed for muscle spasms. 06/16/20   Pattricia Boss, MD  JANUVIA 100 MG tablet TAKE 1 TABLET BY MOUTH EVERY DAY 07/11/21   Renato Shin, MD  metFORMIN (GLUCOPHAGE) 1000 MG tablet TAKE 1 TABLET BY MOUTH 2 TIMES DAILY WITH A MEAL. 08/12/21   Renato Shin, MD  Multiple Vitamins-Minerals (MULTIVITAMIN WITH MINERALS) tablet Take 1 tablet by mouth daily.    [provider]  Omega-3 Fatty Acids (FISH OIL) 1000 MG CAPS Take 2 capsules (2,000 mg total) by mouth daily. 04/25/20   Libby Maw, MD  oxyCODONE-acetaminophen (PERCOCET) 10-325 MG tablet Take 1 tablet by mouth 3 (three) times daily as needed for pain. 10/25/19   Bayard Hugger, NP  repaglinide (PRANDIN) 2 MG tablet Take 1 tablet (2 mg total) by mouth 2 (two) times daily before a meal. 06/04/21   Renato Shin, MD   Exam: Current vital signs: Wt 94.6 kg   BMI 27.52 kg/m   Physical Exam  Constitutional: Well appearing male in NAD. WDWN.  Psych: Affect light but concerned.  Eyes: No scleral injection. HENT: No OP obstruction. Head: Normocephalic/Atraumatic.   Cardiovascular: Normal rate and regular rhythm on telemetry.  No LE edema.  Respiratory: Effort normal.  GI: Soft.  No distension. There is no tenderness.  Skin: WDI. Purplish discoloration on right wrist area.   Neuro: Mental Status: Patient is awake, alert, oriented to person, place, month, year, and situation. Patient is able to give a clear and coherent history. No signs of aphasia or neglect. Cranial Nerves: II: Visual Fields are full. Pupils are equal, round, and reactive to light.  III,IV, VI: EOMI without ptosis or diplopia.  V: Facial sensation is symmetric to light touch.  VII: Facial movement with right lower face droop.   VIII: hearing is intact to voice. X: Uvula is midline and palate elevates symmetrically. XI: Shoulder shrug is symmetric. XII: tongue is midline without atrophy or fasciculations.  Motor: RUE: grip 0    bicep  0     tricep   0                     RLE: thigh 0      knee   0     plantar flexion  0     dorsiflexion 0 LUE: grip  5     bicep  5     tricep 5                       LLE: thigh   5    knee  5      plantar flexion 5       dorsiflexion 5 Tone is normal. Bulk is normal.  Sensory: Sensation is symmetric to light touch in the UE/LEs. Extinction is absent to DSS.  Plantars: Toes are downgoing bilaterally. Cerebellar: FNF without ataxia on left. HKS without ataxia on left. Unable to perform on right.  Gait:   Deferred.   I have reviewed labs in epic and the pertinent results are:  creat  0.6    glucose  169    HbA1c 6.8 09/04/21 LDL 109 08/12/21  CTH -No evidence of acute large  vascular territory infarct or acute hemorrhage. ASPECTS is 10. -Remote right cerebellar infarct.  CTA head and neck pending official read.   Assessment: 80 yo male who presented as a code stroke. His stroke risk factors are prior stroke, HLD, DM II, and HTN. He had no movement on the right side, so decided to give TNK after patient gave permission. His A1C from last month was at goal for stroke prevention, so we will not repeat that. His LDL was 109 last month, so we will adjust statin and he should f/p with PCP for recheck in one month. Given urinary incontinence we will check EEG.  Delay in tNK due to questionable seizure (incontinence/bruising on right hand) and exclusion as well as elevated BP which needed to checked several times and treated prior to tNK.  Plan: -admit to ICU.  -NIHSS per protocol. -Frequent neuro checks.  -HOB 30 degrees.  -ACDs only, no chemical prophylaxis.  -MRI brain without contrast tonight.  -ST/OT/PT consults.  -RN swallow screen.  -If patient passes swallow screen, order carb modified/heart healthy diet.  -echocardiogram.  -PT/INR, TSH.  -No need to repeat LDL or HbA1c.  -Increase Lipitor to 40mg  po qd. Lipid check in one month.   Code Status: Full Code   This patient is critically ill and at significant risk of neurological worsening, death and care requires constant monitoring of vital signs, hemodynamics,respiratory and cardiac monitoring, neurological assessment, discussion with family, other specialists and medical decision making of high complexity. I spent 73 minutes of neurocritical care time  in the care of  this patient. This was time spent independent of any time provided by nurse practitioner or PA.   Lynnae Sandhoff, MD Stroke Neurology Page: [phone]

## 2021-09-14 NOTE — Progress Notes (Signed)
EEG done at bedside. Results pending.  

## 2021-09-14 NOTE — ED Triage Notes (Signed)
Pt BIB [facility] as Code Stroke. Pt was reported to be with family & was at baseline, walking around (LKN 1200). Family left & returned to find pt face down on the floor with Right facial droop, slurred speech & not moving Right arm/leg. EMS reports pt is A/Ox4, CBG 163, 185/102, NOT on thinners, 18g PIV in Left AC.

## 2021-09-14 NOTE — ED Notes (Signed)
Larry Robertson ([phone] called asking to be update when there is news

## 2021-09-14 NOTE — ED Provider Notes (Signed)
[place] [facility] EMERGENCY DEPARTMENT Provider Note   CSN: [id number] Arrival date & time: 09/14/21  1315  An emergency department physician performed an initial assessment on this suspected stroke patient at 25.  History Chief Complaint  Patient presents with   Code Stroke    Larry Robertson is a 80 y.o. male.  HPI Patient came in as a code stroke.  Met upon arrival at the bridge by myself and Dr. Theda Sers from neurology.  Last normal at noon.  Reportedly family left at noon and then came back and found him on the ground.  Question of fall.  Reportedly cannot get up on his own.  Right-sided weakness.  Right-sided facial droop with slurred speech.  No history of stroke.  Not on blood thinners.  CBG good although patient was somewhat hypertensive.    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   left hemidiaphragm elevation   DIABETES MELLITUS, TYPE II 50/27/7412   Diastolic dysfunction    on echo   Diverticulosis    ERECTILE DYSFUNCTION 08/25/2007   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   Heart murmur    but was not stated by DR Hilty   Hepatitis    took treatment   HEPATITIS C 08/25/2007   HYPERTENSION 08/25/2007   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   no per pt   HYPOGONADISM 09/27/2009    Patient Active Problem List   Diagnosis Date Noted   Stroke ([place]) 09/14/2021   Herniated thoracic disc without myelopathy 07/05/2021   Elevated triglycerides with high cholesterol 04/18/2021   Other chest pain 04/11/2021   CKD (chronic kidney disease) stage 2, GFR 60-89 ml/min 12/16/2019   B12 deficiency 12/16/2019   Iron overload 12/16/2019   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   Macrocytosis 10/18/2019   Anemia 09/19/2019   Need for influenza vaccination 09/19/2019   Breast mass 09/15/2019    Breast tenderness in male 09/15/2019   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   OA (osteoarthritis) of knee 06/17/2019   History of hepatitis C 06/14/2019   Elevated cholesterol 06/14/2019   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   Labral tear of shoulder, degenerative, left 08/23/2018   Contracture of joint of left shoulder region 08/23/2018   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/23/2018   Chronic pain syndrome 08/23/2018   Herniated cervical intervertebral disc 03/04/2018   Narcotic drug use 11/23/2017   Dyslipidemia 10/01/2017   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/01/2017   Atrial fibrillation ([place]) 06/08/2017   Thrombocytopenia ([place]) 10/15/2016   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   Nocturia 08/09/2014   Hematuria 08/09/2014   Other dysphagia 05/18/2014   Personal history of colonic polyps 07/13/2013   Nonspecific abnormal electrocardiogram (ECG) (EKG) 04/20/2013   Encounter for long-term (current) use of other medications 04/15/2012   Healthcare maintenance 04/15/2012   Radiculopathy of leg 10/08/2011   Blurry vision 10/08/2011   Special screening, prostate cancer 04/09/2011   Lumbago 04/09/2011   OTHER SPEC HYPERTROPHIC&ATROPHIC CONDITION SKIN 02/06/2010   FOOT PAIN, LEFT 02/06/2010   HYPOGONADISM 09/27/2009   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   Left sided abdominal pain of unknown cause 07/17/2009   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   Acute hepatitis C virus infection 08/25/2007   Diabetes ([place]) 08/25/2007   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   ERECTILE DYSFUNCTION 08/25/2007   Essential  hypertension 08/25/2007    Past Surgical History:  Procedure Laterality Date   ABDOMINAL HERNIA REPAIR  2000   ANTERIOR CERVICAL DECOMP/DISCECTOMY FUSION Right 03/04/2018   Procedure: Cervical Three-Four, Cervical Six-Seven Right Side approach, Anterior cervical decompression/discectomy/fusion;  Surgeon: Erline Levine, MD;  Location: [facility];  Service: Neurosurgery;   Laterality: Right;  right side anterior approach   CHOLECYSTECTOMY  1999   COLONOSCOPY     LUMBAR FUSION  2005   LUMBAR LAMINECTOMY/DECOMPRESSION MICRODISCECTOMY Left 07/05/2021   Procedure: Left Thoracic 12 to Lumbar 1 Transpedicular microdiscectomy;  Surgeon: Erline Levine, MD;  Location: [facility];  Service: Neurosurgery;  Laterality: Left;  3C/RM 21   TONSILLECTOMY AND ADENOIDECTOMY     UPPER GASTROINTESTINAL ENDOSCOPY         Family History  Problem Relation Age of Onset   Stroke Mother    Diabetes Maternal Grandmother    Cancer Neg Hx    Colon cancer Neg Hx    Esophageal cancer Neg Hx    Rectal cancer Neg Hx    Stomach cancer Neg Hx     Social History   Tobacco Use   Smoking status: Never   Smokeless tobacco: Never  Vaping Use   Vaping Use: Never used  Substance Use Topics   Alcohol use: Not Currently    Comment: Quit 3 months ago in July 2020.   Drug use: No    Home Medications Prior to Admission medications   Medication Sig Start Date End Date Taking? Authorizing Provider  methylPREDNISolone (MEDROL) 4 MG TBPK tablet Take 4 mg by mouth See admin instructions. 09/05/21  Yes [provider]  Bartlett test strip USE AS INSTRUCTED ONCE DAILY 09/15/20   Renato Shin, MD  allopurinol (ZYLOPRIM) 100 MG tablet TAKE 1 TABLET BY MOUTH EVERY DAY Patient taking differently: Take 100 mg by mouth daily. 05/06/21   Libby Maw, MD  amLODipine-olmesartan (AZOR) 10-40 MG tablet TAKE 1 TABLET BY MOUTH EVERY DAY Patient taking differently: Take 1 tablet by mouth daily. 08/11/21   Libby Maw, MD  atorvastatin (LIPITOR) 20 MG tablet Take 1 tablet (20 mg total) by mouth daily. 08/13/21   Libby Maw, MD  b complex vitamins capsule Take 1 capsule by mouth daily. 04/25/20   Libby Maw, MD  colchicine 0.6 MG tablet 1 tablet every hour as needed for gout, not to exceed 6/day Patient taking differently: Take 0.6 mg by mouth as  needed (gout - no more than 3.82m per day). 09/01/18   ERenato Shin MD  cyclobenzaprine (FLEXERIL) 10 MG tablet Take 1 tablet (10 mg total) by mouth 2 (two) times daily as needed for muscle spasms. 06/16/20   RPattricia Boss MD  JANUVIA 100 MG tablet TAKE 1 TABLET BY MOUTH EVERY DAY Patient taking differently: Take 100 mg by mouth daily. 07/11/21   ERenato Shin MD  metFORMIN (GLUCOPHAGE) 1000 MG tablet TAKE 1 TABLET BY MOUTH 2 TIMES DAILY WITH A MEAL. Patient taking differently: Take 1,000 mg by mouth 2 (two) times daily with a meal. 08/12/21   ERenato Shin MD  Multiple Vitamins-Minerals (MULTIVITAMIN WITH MINERALS) tablet Take 1 tablet by mouth daily.    [provider]  Omega-3 Fatty Acids (FISH OIL) 1000 MG CAPS Take 2 capsules (2,000 mg total) by mouth daily. 04/25/20   KLibby Maw MD  oxyCODONE-acetaminophen (PERCOCET) 10-325 MG tablet Take 1 tablet by mouth 3 (three) times daily as needed for pain. 10/25/19   TDanella Sensing  L, NP  repaglinide (PRANDIN) 2 MG tablet Take 1 tablet (2 mg total) by mouth 2 (two) times daily before a meal. 06/04/21   Renato Shin, MD    Allergies    Lyrica [pregabalin] and Gabapentin  Review of Systems   Review of Systems  Constitutional:  Negative for appetite change.  HENT:  Negative for congestion.   Respiratory:  Negative for shortness of breath.   Cardiovascular:  Negative for chest pain.  Gastrointestinal:  Negative for abdominal pain.  Genitourinary:  Negative for flank pain.  Musculoskeletal:  Negative for back pain.  Skin:  Negative for rash.  Neurological:  Positive for weakness.       Patient was incontinent of urine.  Psychiatric/Behavioral:  Negative for confusion.    Physical Exam Updated Vital Signs BP (!) 166/91   Pulse 73   Temp 98.1 F (36.7 C)   Resp 17   Wt 94.6 kg   SpO2 95%   BMI 27.52 kg/m   Physical Exam Vitals and nursing note reviewed.  HENT:     Head:     Comments: Right-sided facial droop.     Nose: Nose normal.  Eyes:     Extraocular Movements: Extraocular movements intact.     Pupils: Pupils are equal, round, and reactive to light.  Cardiovascular:     Rate and Rhythm: Regular rhythm.  Pulmonary:     Breath sounds: No wheezing or rhonchi.  Abdominal:     Tenderness: There is no abdominal tenderness.  Musculoskeletal:        General: No tenderness.     Cervical back: Neck supple.  Skin:    General: Skin is warm.     Capillary Refill: Capillary refill takes less than 2 seconds.  Neurological:     Mental Status: He is alert and oriented to person, place, and time.     Comments: Awake and appropriate.  Right-sided facial droop.  Eye movements intact.  No neglect.  Sensation intact bilaterally.  Has right upper extremity weakness.  May have slight movement proximally but not enough to actually raise the arm.  No movement in hand.  Minimal to no movement on the right lower extremity.  Sensation intact bilaterally.  Complete NIH scoring done by neurology.  Does have slurred speech.    ED Results / Procedures / Treatments   Labs (all labs ordered are listed, but only abnormal results are displayed) Labs Reviewed  CBC - Abnormal; Notable for the following components:      Result Value   WBC 17.6 (*)    All other components within normal limits  DIFFERENTIAL - Abnormal; Notable for the following components:   Neutro Abs 14.9 (*)    Monocytes Absolute 1.1 (*)    Abs Immature Granulocytes 0.32 (*)    All other components within normal limits  COMPREHENSIVE METABOLIC PANEL - Abnormal; Notable for the following components:   Sodium 133 (*)    CO2 20 (*)    Glucose, Bld 161 (*)    AST 60 (*)    Total Bilirubin 1.5 (*)    All other components within normal limits  I-STAT CHEM 8, ED - Abnormal; Notable for the following components:   Sodium 132 (*)    Creatinine, Ser 0.60 (*)    Glucose, Bld 169 (*)    Calcium, Ion 0.97 (*)    TCO2 19 (*)    All other components within  normal limits  CBG MONITORING, ED - Abnormal; Notable for  the following components:   Glucose-Capillary 173 (*)    All other components within normal limits  RESP PANEL BY RT-PCR (FLU A&B, COVID) ARPGX2  MRSA NEXT GEN BY PCR, NASAL  PROTIME-INR  APTT  TSH    EKG None  Radiology MR BRAIN WO CONTRAST  Result Date: 09/14/2021 CLINICAL DATA:  Neuro deficit, acute, stroke suspected EXAM: MRI HEAD WITHOUT CONTRAST TECHNIQUE: Multiplanar, multiecho pulse sequences of the brain and surrounding structures were obtained without intravenous contrast. COMPARISON:  Same day CT and CTA. FINDINGS: Brain: Acute infarct in the left pons. Mild associated edema. Mild for age additional scattered T2 hyperintensities in the white matter, nonspecific but compatible with chronic microvascular ischemic disease. 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 No acute hemorrhage, hydrocephalus, extra-axial fluid collection, mass lesion or midline shift. Small focus of susceptibility artifact in the left frontal white matter without edema, compatible with remote microhemorrhage. Vascular: Further evaluated on same day CTA. Skull and upper cervical spine: Normal marrow signal. Sinuses/Orbits: Mild paranasal sinus mucosal thickening. Unremarkable orbits. Other: No mastoid effusions. IMPRESSION: 1. Acute infarct in the left pons.  Mild associated edema. 2. 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 3. Mild chronic microvascular ischemic disease. Electronically Signed   By: Margaretha Sheffield M.D.   On: 09/14/2021 19:04   CT CHEST ABDOMEN PELVIS W CONTRAST  Result Date: 09/14/2021 CLINICAL DATA:  Fall, right-sided weakness, right chest/abdominal pain EXAM: CT CHEST, ABDOMEN, AND PELVIS WITH CONTRAST TECHNIQUE: Multidetector CT imaging of the chest, abdomen and pelvis was performed following the standard protocol during bolus administration of intravenous contrast. CONTRAST:  68m OMNIPAQUE IOHEXOL 350 MG/ML SOLN COMPARISON:  CT  abdomen/pelvis dated 04/16/2021 FINDINGS: CT CHEST FINDINGS Cardiovascular: Heart is normal in size.  No pericardial effusion No evidence thoracic aortic aneurysm. Atherosclerotic calcifications of the aortic arch. 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 Mediastinum/Nodes: No suspicious mediastinal lymphadenopathy. Visualized thyroid is unremarkable. Lungs/Pleura: Eventration of left hemidiaphragm. Associated left lower lobe and right basilar atelectasis. No focal consolidation. No suspicious pulmonary nodules. No pleural effusion or pneumothorax. Musculoskeletal: Mild to moderate degenerative changes of the mid/lower thoracic spine. Cervical spine fixation hardware, incompletely visualized. No fracture is seen. Specifically, the right ribs are intact. CT ABDOMEN PELVIS FINDINGS Hepatobiliary: Macronodular hepatic contour. No focal hepatic lesion is seen. Status post cholecystectomy. No intrahepatic or extrahepatic ductal dilatation. Pancreas: Mild parenchymal atrophy. Spleen: Within normal limits. Adrenals/Urinary Tract: Adrenal glands are within normal limits. Early excretory contrast in the bilateral renal collecting systems. 2.6 cm lateral left upper pole renal cyst (series 3/image 58). 15 mm medial right upper pole renal cyst (series 3/image 70). No hydronephrosis. Moderately distended bladder. Stomach/Bowel: Stomach is within normal limits. No evidence of bowel obstruction. Normal appendix (series 3/image 90). Left colonic diverticulosis, without evidence of diverticulitis. Vascular/Lymphatic: No evidence of abdominal aortic aneurysm. Atherosclerotic calcifications of the abdominal aorta and branch vessels. No suspicious abdominopelvic lymphadenopathy. Reproductive: Prostate is unremarkable. Other: No abdominopelvic ascites. Musculoskeletal: Moderate degenerative changes of the lumbar spine. Mild new superior endplate changes with loss of height at L4, chronic. No retropulsion.  IMPRESSION: No evidence of traumatic injury to the chest, abdomen, or pelvis. Specifically, no right rib fracture is seen. No acute cardiopulmonary abnormality. Stable ancillary findings in the abdomen/pelvis. Electronically Signed   By: SJulian HyM.D.   On: 09/14/2021 20:32   EEG adult  Result Date: 09/14/2021 KGreta Doom MD     09/14/2021  3:56 PM History: 80yo M with weakness and  slurred speech Sedation: None Technique: This EEG was acquired with electrodes placed according to the International 10-20 electrode system (including Fp1, Fp2, F3, F4, C3, C4, P3, P4, O1, O2, T3, T4, T5, T6, A1, A2, Fz, Cz, Pz). The following electrodes were missing or displaced: none. Background: The background consists of intermixed alpha and beta activities. There is a well defined posterior dominant rhythm of 9 Hz that attenuates with eye opening. There is increasing delta with drowsiness. Sleep is recorded with normal appearing structures. Photic stimulation: Physiologic driving is not performed EEG Abnormalities: none Clinical Interpretation: This normal EEG is recorded in the waking and sleep state. There was no seizure or seizure predisposition recorded on this study. Please note that lack of epileptiform activity on EEG does not preclude the possibility of epilepsy. Roland Rack, MD [facility] [phone] If 7pm- 7am, please page neurology on call as listed in [place].   CT HEAD CODE STROKE WO CONTRAST  Result Date: 09/14/2021 CLINICAL DATA:  Code stroke.  Neuro deficit, acute, stroke suspected EXAM: CT HEAD WITHOUT CONTRAST TECHNIQUE: Contiguous axial images were obtained from the base of the skull through the vertex without intravenous contrast. COMPARISON:  12/29/2008. FINDINGS: Brain: No evidence of acute large vascular territory infarction, hemorrhage, hydrocephalus, extra-axial collection or mass lesion/mass effect. Remote right cerebellar infarct. Vascular: No hyperdense  vessel identified. Skull: No acute fracture. Sinuses/Orbits: Mild paranasal sinus mucosal thickening. No acute orbital findings. Other: No mastoid effusions. ASPECTS [facility] Early CT Score) total score (0-10 with 10 being normal): 10. IMPRESSION: 1. No evidence of acute large vascular territory infarct or acute hemorrhage. ASPECTS is 10. 2. Remote right cerebellar infarct. Code stroke imaging results were communicated on 09/14/2021 at 1:32 pm to provider Dr. Theda Sers via secure text paging. CT head Electronically Signed   By: Margaretha Sheffield M.D.   On: 09/14/2021 13:33   CT ANGIO HEAD CODE STROKE  Result Date: 09/14/2021 CLINICAL DATA:  Neuro deficit, acute, stroke suspected; Stroke/TIA, assess extracranial arteries EXAM: CT ANGIOGRAPHY HEAD AND NECK TECHNIQUE: Multidetector CT imaging of the head and neck was performed using the standard protocol during bolus administration of intravenous contrast. Multiplanar CT image reconstructions and MIPs were obtained to evaluate the vascular anatomy. Carotid stenosis measurements (when applicable) are obtained utilizing NASCET criteria, using the distal internal carotid diameter as the denominator. CONTRAST:  136m OMNIPAQUE IOHEXOL 350 MG/ML SOLN COMPARISON:  None FINDINGS: CTA NECK Aortic arch: Mild calcified plaque along the arch and patent great vessel origins. There is aberrant origin of the right subclavian artery. No high-grade proximal subclavian stenosis. Right carotid system: Patent. Mixed plaque along the proximal internal carotid with nearly 50% stenosis. Left carotid system: Patent. Noncalcified plaque along the proximal internal carotid with less than 50% stenosis. Vertebral arteries: Left vertebral artery is patent. There is no significant enhancement of the right vertebral artery from the origin to the V3 segment where there is partial reconstitution Skeleton: Postoperative changes of anterior fusion at C3-C5 and C6-C7. Cervical spine  degenerative changes. Other neck: Unremarkable. Upper chest: No apical lung mass. Review of the MIP images confirms the above findings CTA HEAD Anterior circulation: Intracranial internal carotid arteries are patent. There is calcified plaque along cavernous and supraclinoid portions mild to moderate stenosis. Anterior and middle cerebral arteries are patent. Posterior circulation: Intracranial right vertebral artery is patent but irregular with mixed plaque and areas of moderate to marked stenosis. The right PICA origin is patent. Intracranial left vertebral artery is patent. Is basilar  artery is patent. Other major cerebellar artery origins are patent. Posterior cerebral arteries are patent. There is a right posterior communicating artery present. Venous sinuses: Patent as allowed by contrast bolus timing. 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 The intracranial portion is patent but regular with stenoses. Plaque at the proximal right ICA with nearly 50% stenosis. Plaque at the proximal left ICA with less than 50% stenosis. Initial results were provided by telephone at the time of interpretation on 09/14/2021 at 2:00 pm to provider [facility] , who verbally acknowledged these results. Electronically Signed   By: Macy Mis M.D.   On: 09/14/2021 14:22   CT ANGIO NECK CODE STROKE  Result Date: 09/14/2021 CLINICAL DATA:  Neuro deficit, acute, stroke suspected; Stroke/TIA, assess extracranial arteries EXAM: CT ANGIOGRAPHY HEAD AND NECK TECHNIQUE: Multidetector CT imaging of the head and neck was performed using the standard protocol during bolus administration of intravenous contrast. Multiplanar CT image reconstructions and MIPs were obtained to evaluate the vascular anatomy. Carotid stenosis measurements (when applicable) are obtained utilizing NASCET  criteria, using the distal internal carotid diameter as the denominator. CONTRAST:  116m OMNIPAQUE IOHEXOL 350 MG/ML SOLN COMPARISON:  None FINDINGS: CTA NECK Aortic arch: Mild calcified plaque along the arch and patent great vessel origins. There is aberrant origin of the right subclavian artery. No high-grade proximal subclavian stenosis. Right carotid system: Patent. Mixed plaque along the proximal internal carotid with nearly 50% stenosis. Left carotid system: Patent. Noncalcified plaque along the proximal internal carotid with less than 50% stenosis. Vertebral arteries: Left vertebral artery is patent. There is no significant enhancement of the right vertebral artery from the origin to the V3 segment where there is partial reconstitution Skeleton: Postoperative changes of anterior fusion at C3-C5 and C6-C7. Cervical spine degenerative changes. Other neck: Unremarkable. Upper chest: No apical lung mass. Review of the MIP images confirms the above findings CTA HEAD Anterior circulation: Intracranial internal carotid arteries are patent. There is calcified plaque along cavernous and supraclinoid portions mild to moderate stenosis. Anterior and middle cerebral arteries are patent. Posterior circulation: Intracranial right vertebral artery is patent but irregular with mixed plaque and areas of moderate to marked stenosis. The right PICA origin is patent. Intracranial left vertebral artery is patent. Is basilar artery is patent. Other major cerebellar artery origins are patent. Posterior cerebral arteries are patent. There is a right posterior communicating artery present. Venous sinuses: Patent as allowed by contrast bolus timing. 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 The intracranial portion is patent but regular with stenoses. Plaque at the proximal right ICA with nearly  50% stenosis. Plaque at the proximal left ICA with less than 50% stenosis. Initial results were provided by telephone at the time of interpretation on 09/14/2021 at 2:00 pm [facility] these results. Electronically Signed   By: PMacy MisM.D.   On: 09/14/2021 14:22    Procedures Procedures   Medications Ordered in ED Medications   stroke: mapping our early stages of recovery book (has no administration in time range)  0.9 %  sodium chloride infusion (has no administration in time range)  acetaminophen (TYLENOL) tablet 650 mg (has no administration in time range)    Or  acetaminophen (TYLENOL) 160 MG/5ML solution 650 mg (  has no administration in time range)    Or  acetaminophen (TYLENOL) suppository 650 mg (has no administration in time range)  senna-docusate (Senokot-S) tablet 1 tablet (has no administration in time range)  pantoprazole (PROTONIX) injection 40 mg (has no administration in time range)  clevidipine (CLEVIPREX) infusion 0.5 mg/mL (has no administration in time range)  labetalol (NORMODYNE) injection (10 mg Intravenous Given 09/14/21 1355)  atorvastatin (LIPITOR) tablet 40 mg (has no administration in time range)  Chlorhexidine Gluconate Cloth 2 % PADS 6 each (has no administration in time range)  sodium chloride flush (NS) 0.9 % injection 3 mL (3 mLs Intravenous Given 09/14/21 1336)  tenecteplase (TNKASE) injection for Stroke 24 mg (24 mg Intravenous Given 09/14/21 1336)  iohexol (OMNIPAQUE) 350 MG/ML injection 100 mL (100 mLs Intravenous Contrast Given 09/14/21 1348)  iohexol (OMNIPAQUE) 350 MG/ML injection 75 mL (75 mLs Intravenous Contrast Given 09/14/21 2008)    ED Course  I have reviewed the triage vital signs and the nursing notes.  Pertinent labs & imaging results that were available during my care of the patient were reviewed by me and considered in my medical decision making (see chart for details).    MDM  Rules/Calculators/A&P                           Patient came in as a code stroke.  Acute weakness.  Rather severe.  Lateralizing to right.  Head CT reassuring.  Patient is tPA candidate and it was given.  On reevaluation still continued to have similar deficits.  Initially had some hypotension that was treated with dose of labetalol.  Admit to ICU with neurology.  CBG reassuring.  Do not see other reversible causes of deficits.  Also has urinary incontinence.  Doubt it.  Patient states he was conscious the whole time.  CRITICAL CARE Performed by: Davonna Belling Total critical care time: 30 minutes Critical care time was exclusive of separately billable procedures and treating other patients. Critical care was necessary to treat or prevent imminent or life-threatening deterioration. Critical care was time spent personally by me on the following activities: development of treatment plan with patient and/or surrogate as well as nursing, discussions with consultants, evaluation of patient's response to treatment, examination of patient, obtaining history from patient or surrogate, ordering and performing treatments and interventions, ordering and review of laboratory studies, ordering and review of radiographic studies, pulse oximetry and re-evaluation of patient's condition.  Final Clinical Impression(s) / ED Diagnoses Final diagnoses:  Cerebrovascular accident (CVA), unspecified mechanism [facility])    Rx / [place] Orders ED Discharge Orders     None        Davonna Belling, MD 09/14/21 2041

## 2021-09-15 ENCOUNTER — Inpatient Hospital Stay (HOSPITAL_COMMUNITY): Payer: Medicare Other

## 2021-09-15 DIAGNOSIS — M25511 Pain in right shoulder: Secondary | ICD-10-CM

## 2021-09-15 DIAGNOSIS — R1312 Dysphagia, oropharyngeal phase: Secondary | ICD-10-CM

## 2021-09-15 DIAGNOSIS — E11649 Type 2 diabetes mellitus with hypoglycemia without coma: Secondary | ICD-10-CM

## 2021-09-15 LAB — CBC
HCT: 43.3 % (ref 39.0–52.0)
Hemoglobin: 14.4 g/dL (ref 13.0–17.0)
MCH: 29.8 pg (ref 26.0–34.0)
MCHC: 33.3 g/dL (ref 30.0–36.0)
MCV: 89.6 fL (ref 80.0–100.0)
Platelets: 267 10*3/uL (ref 150–400)
RBC: 4.83 MIL/uL (ref 4.22–5.81)
RDW: 13.2 % (ref 11.5–15.5)
WBC: 14.9 10*3/uL — ABNORMAL HIGH (ref 4.0–10.5)
nRBC: 0 % (ref 0.0–0.2)

## 2021-09-15 LAB — GLUCOSE, CAPILLARY
Glucose-Capillary: 194 mg/dL — ABNORMAL HIGH (ref 70–99)
Glucose-Capillary: 190 mg/dL — ABNORMAL HIGH (ref 70–99)
Glucose-Capillary: 183 mg/dL — ABNORMAL HIGH (ref 70–99)

## 2021-09-15 LAB — BASIC METABOLIC PANEL
Anion gap: 13 (ref 5–15)
BUN: 16 mg/dL (ref 8–23)
CO2: 20 mmol/L — ABNORMAL LOW (ref 22–32)
Calcium: 9 mg/dL (ref 8.9–10.3)
Chloride: 102 mmol/L (ref 98–111)
Creatinine, Ser: 0.84 mg/dL (ref 0.61–1.24)
GFR, Estimated: >60 mL/min (ref >60)
Glucose, Bld: 181 mg/dL — ABNORMAL HIGH (ref 70–99)
Potassium: 4 mmol/L (ref 3.5–5.1)
Sodium: 135 mmol/L (ref 135–145)

## 2021-09-15 LAB — HEMOGLOBIN A1C
Hgb A1c MFr Bld: 6.9 % — ABNORMAL HIGH (ref 4.8–5.6)
Mean Plasma Glucose: 151.33 mg/dL

## 2021-09-15 LAB — MRSA NEXT GEN BY PCR, NASAL: MRSA by PCR Next Gen: NOT DETECTED

## 2021-09-15 MED ORDER — LABETALOL HCL 5 MG/ML IV SOLN: 5.0000 mg | INTRAVENOUS | Status: DC | PRN

## 2021-09-15 MED ORDER — ENOXAPARIN SODIUM 40 MG/0.4ML IJ SOSY
40.0000 mg | PREFILLED_SYRINGE | INTRAMUSCULAR | Status: DC
  Administered 2021-09-15 – 2021-09-17 (×3): 40 mg via SUBCUTANEOUS
  Filled 2021-09-15 (×3): qty 0.4

## 2021-09-15 MED ORDER — INSULIN ASPART 100 UNIT/ML IJ SOLN: 0.0000 [IU] | Freq: Three times a day (TID) | INTRAMUSCULAR | Status: DC

## 2021-09-15 MED ORDER — CHLORHEXIDINE GLUCONATE 0.12 % MT SOLN
15.0000 mL | Freq: Two times a day (BID) | OROMUCOSAL | Status: DC
  Administered 2021-09-15 – 2021-09-16 (×4): 15 mL via OROMUCOSAL
  Filled 2021-09-15 (×4): qty 15

## 2021-09-15 MED ORDER — INSULIN ASPART 100 UNIT/ML IJ SOLN
0.0000 [IU] | INTRAMUSCULAR | Status: DC
  Administered 2021-09-15 – 2021-09-16 (×5): 2 [IU] via SUBCUTANEOUS
  Administered 2021-09-16: 3 [IU] via SUBCUTANEOUS
  Administered 2021-09-16 (×3): 2 [IU] via SUBCUTANEOUS
  Administered 2021-09-16 – 2021-09-17 (×3): 3 [IU] via SUBCUTANEOUS
  Administered 2021-09-17: 1 [IU] via SUBCUTANEOUS

## 2021-09-15 MED ORDER — ASPIRIN 300 MG RE SUPP
300.0000 mg | Freq: Every day | RECTAL | Status: DC
  Administered 2021-09-15 – 2021-09-17 (×3): 300 mg via RECTAL
  Filled 2021-09-15 (×3): qty 1

## 2021-09-15 MED ORDER — INSULIN ASPART 100 UNIT/ML IJ SOLN: 0.0000 [IU] | Freq: Every day | INTRAMUSCULAR | Status: DC

## 2021-09-15 MED ORDER — ASPIRIN EC 81 MG PO TBEC
81.0000 mg | DELAYED_RELEASE_TABLET | Freq: Every day | ORAL | Status: DC
  Filled 2021-09-15: qty 1

## 2021-09-15 MED ORDER — ORAL CARE MOUTH RINSE
15.0000 mL | Freq: Two times a day (BID) | OROMUCOSAL | Status: DC
  Administered 2021-09-15 – 2021-09-16 (×4): 15 mL via OROMUCOSAL

## 2021-09-15 NOTE — Progress Notes (Signed)
Pt arrived from 4N, tele placed and verified, suction equipment at bedside, vitals WNL, patient resting comfortably, bed alarm on, call light available.

## 2021-09-15 NOTE — Progress Notes (Signed)
SLP Cancellation Note  Patient Details Name: Larry Robertson MRN: [id number] DOB: [birth year]   Cancelled treatment:        MBSS tentatively planned for this afternoon has been canceled.  Radiology schedule cannot accommodate study today. Will reattempt next date.   Shinichi Anguiano E Loni Abdon 09/15/2021, 12:05 PM

## 2021-09-15 NOTE — Progress Notes (Deleted)
Patient transferred to 6N. Report given to [facility] RN. All belongings transferred with patient. Family aware of transfer. Patient's foley discontinued this AM with no void noted at present. Patient noted with 280cc via bladder scan. 6N RN aware, external cath in place, will continue to monitor.

## 2021-09-15 NOTE — Progress Notes (Signed)
OT Cancellation Note  Patient Details Name: Larry Robertson MRN: [id number] DOB: October 21, 1941   Cancelled Treatment:    Reason Eval/Treat Not Completed: Active bedrest order (TNK given 10/15 1336. Will assess later date.)  [place], OT/L   Acute OT Clinical Specialist [place] Pager [phone] Office [phone]  09/15/2021, 8:10 AM

## 2021-09-15 NOTE — Evaluation (Signed)
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   left hemidiaphragm elevation   DIABETES MELLITUS, TYPE II 41/66/0630   Diastolic dysfunction    on echo   Diverticulosis    ERECTILE DYSFUNCTION 08/25/2007   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   Heart murmur    but was not stated by DR Hilty   Hepatitis    took treatment   HEPATITIS C 08/25/2007   HYPERTENSION 08/25/2007   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   no per pt   HYPOGONADISM 09/27/2009   Past Surgical History:  Past Surgical History:  Procedure Laterality Date   ABDOMINAL HERNIA REPAIR  2000   ANTERIOR CERVICAL DECOMP/DISCECTOMY FUSION Right 03/04/2018   Procedure: Cervical Three-Four, Cervical Six-Seven Right Side approach, Anterior cervical decompression/discectomy/fusion;  Surgeon: Erline Levine, MD;  Location: [place];  Service: Neurosurgery;  Laterality: Right;  right side anterior approach   CHOLECYSTECTOMY  1999   COLONOSCOPY     LUMBAR FUSION  2005   LUMBAR LAMINECTOMY/DECOMPRESSION MICRODISCECTOMY Left 07/05/2021   Procedure: Left Thoracic 12 to Lumbar 1 Transpedicular microdiscectomy;  Surgeon: Erline Levine, MD;  Location: [place];  Service: Neurosurgery;  Laterality: Left;  3C/RM 21   TONSILLECTOMY AND ADENOIDECTOMY     UPPER GASTROINTESTINAL ENDOSCOPY     HPI:  Larry Robertson is a 80 y.o. male who presented via EMS for right facial droop, right sided weakness, and slurred speech. MRI 10/15: "Acute infarct in the left pons." Chest CT 10/15: "Eventration of left hemidiaphragm.  Associated left  lower lobe and right basilar atelectasis."  Pt with a PMHx of GERD, DM II, HLD, OA, cancer, gout, Hep C, and HTN."    Assessment / Plan / Recommendation  Clinical Impression  Pt presents with clinical indicators of pharyngeal dysphagia in setting of L [place] CVA.  Xerostomia noted.  SLP provided oral care prior to administration of PO trials and place moisturizing gel in mouth at conclusion of assessment.  Pt tolerated ice chip and small amount of water by spoon; although, tod did require 3 swallows with spoon amount of water. There was immediate, prolonged wet cough with water by cup. With puree there was mild-moderate diffuse oral residue.    Given clinical presentation and location of infarct, recommend MBSS prior to initiation of PO diet.  MBSS planned for later this date pending radiology schedule.  Pt reports changes to speech but denies word finding difficulties.   SLP Visit Diagnosis: Dysphagia, unspecified (R13.10)    Aspiration Risk  Moderate aspiration risk    Diet Recommendation NPO   Medication Administration: Via alternative means    Other  Recommendations Oral Care Recommendations: Oral care QID    Recommendations for follow up therapy are one component of a multi-disciplinary discharge planning process, led by the attending physician.  Recommendations may be updated based on patient status, additional functional criteria and insurance authorization.  Follow up Recommendations  (TBD)      Frequency and Duration  (TBD)          Prognosis Prognosis for Safe Diet Advancement:  (  TBD)      Swallow Study   General Date of Onset: 09/14/21 HPI: Larry Robertson is a 80 y.o. male who presented via EMS for right facial droop, right sided weakness, and slurred speech. MRI 10/15: "Acute infarct in the left pons." Chest CT 10/15: "Eventration of left hemidiaphragm. Associated left  lower lobe and right basilar atelectasis."  Pt with a PMHx of GERD, DM II, HLD,  OA, cancer, gout, Hep C, and HTN." Type of Study: Bedside Swallow Evaluation Previous Swallow Assessment: None Diet Prior to this Study: NPO Temperature Spikes Noted: No Respiratory Status: Room air History of Recent Intubation: No Behavior/Cognition: Cooperative;Alert;Pleasant mood Oral Cavity Assessment: Within Functional Limits Oral Care Completed by SLP: Yes Oral Cavity - Dentition: Dentures, top;Dentures, bottom Self-Feeding Abilities: Total assist Patient Positioning: Upright in bed Baseline Vocal Quality: Normal Volitional Cough: Weak Volitional Swallow: Able to elicit    Oral/Motor/Sensory Function Overall Oral Motor/Sensory Function: Moderate impairment Facial ROM: Reduced right Facial Symmetry: Abnormal symmetry right Facial Strength: Reduced right Facial Sensation: Within Functional Limits Lingual ROM: Reduced right Lingual Symmetry: Abnormal symmetry right Lingual Strength: Reduced Lingual Sensation: Within Functional Limits Velum: Within Functional Limits Mandible: Within Functional Limits   Ice Chips Ice chips: Impaired Presentation: Spoon Pharyngeal Phase Impairments: Multiple swallows   Thin Liquid Thin Liquid: Impaired Presentation: Spoon;Cup Oral Phase Impairments: Reduced labial seal Oral Phase Functional Implications: Right anterior spillage Pharyngeal  Phase Impairments: Multiple swallows;Wet Vocal Quality;Cough - Immediate    Nectar Thick Nectar Thick Liquid: Not tested   Honey Thick Honey Thick Liquid: Not tested   Puree Puree: Impaired Oral Phase Functional Implications: Oral residue   Solid     Solid: Not tested      Celedonio Savage, MA, [place] Office: [phone]; Pager [phone] 09/15/2021,11:20 AM

## 2021-09-15 NOTE — Progress Notes (Signed)
PT Cancellation Note  Patient Details Name: MITSUGI SCHRADER MRN: [id number] DOB: December 10, 1940   Cancelled Treatment:    Reason Eval/Treat Not Completed: Active bedrest order. TNK given 10/15 1336.    Lorriane Shire 09/15/2021, 7:26 AM  Lorrin Goodell, PT  Office # [phone] Pager [phone]

## 2021-09-15 NOTE — Progress Notes (Addendum)
STROKE TEAM PROGRESS NOTE   SUBJECTIVE (INTERVAL HISTORY) His wife and daughter are at the bedside. Pt lying in bed, still has slurry speech, and right hemiplegia. Did not pass swallow. On IVF.   OBJECTIVE Temp:  [98 F (36.7 C)-98.8 F (37.1 C)] 98.8 F (37.1 C) (10/16 1100) Pulse Rate:  [72-89] 89 (10/16 1200) Resp:  [15-29] 20 (10/16 1200) BP: (111-189)/(74-109) 162/89 (10/16 1200) SpO2:  [90 %-98 %] 95 % (10/16 1200) Weight:  [94.6 kg] 94.6 kg (10/15 1300)  Recent Labs  Lab 09/14/21 1318  GLUCAP 173*   Recent Labs  Lab 09/14/21 1318 09/14/21 1329  NA 133* 132*  K 3.6 3.9  CL 99 103  CO2 20*  --   GLUCOSE 161* 169*  BUN 17 19  CREATININE 0.84 0.60*  CALCIUM 9.1  --    Recent Labs  Lab 09/14/21 1318  AST 60*  ALT 36  ALKPHOS 47  BILITOT 1.5*  PROT 6.8  ALBUMIN 4.2   Recent Labs  Lab 09/14/21 1318 09/14/21 1329 09/15/21 0810  WBC 17.6*  --  14.9*  NEUTROABS 14.9*  --   --   HGB 14.9 16.7 14.4  HCT 45.5 49.0 43.3  MCV 90.3  --  89.6  PLT 238  --  267   No results for input(s): CKTOTAL, CKMB, CKMBINDEX, TROPONINI in the last 168 hours. Recent Labs    09/14/21 1318  LABPROT 14.5  INR 1.1   No results for input(s): COLORURINE, LABSPEC, PHURINE, GLUCOSEU, HGBUR, BILIRUBINUR, KETONESUR, PROTEINUR, UROBILINOGEN, NITRITE, LEUKOCYTESUR in the last 72 hours.  Invalid input(s): APPERANCEUR     Component Value Date/Time   CHOL 187 08/12/2021 1154   CHOL 128 01/21/2018 1306   TRIG 275.0 (H) 08/12/2021 1154   HDL 40.40 08/12/2021 1154   HDL 42 01/21/2018 1306   CHOLHDL 5 08/12/2021 1154   VLDL 55.0 (H) 08/12/2021 1154   LDLCALC 39 01/21/2018 1306   Lab Results  Component Value Date   HGBA1C 6.9 (H) 09/15/2021   No results found for: LABOPIA, COCAINSCRNUR, LABBENZ, AMPHETMU, THCU, LABBARB  No results for input(s): ETH in the last 168 hours.  I have personally reviewed the radiological images below and agree with the radiology  interpretations.  CT HEAD WO CONTRAST (5MM)  Result Date: 09/15/2021 CLINICAL DATA:  Stroke, follow up EXAM: CT HEAD WITHOUT CONTRAST TECHNIQUE: Contiguous axial images were obtained from the base of the skull through the vertex without intravenous contrast. COMPARISON:  September 14, 2021. FINDINGS: Brain: New edema in the left hemi pons, compatible with known acute infarct seen on MRI. No progressive mass effect or evidence of acute hemorrhage. 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 Mild scattered additional white matter hypodensities, nonspecific but compatible with chronic microvascular disease. No hydrocephalus, extra-axial fluid collection, mass lesion, or midline shift. Vascular: No hyperdense vessel identified. Skull: No acute fracture. Sinuses/Orbits: Visualized sinuses are clear.  Unremarkable orbits. Other: No mastoid effusions. IMPRESSION: New edema in the left hemi pons, compatible with known acute infarct seen on MRI. No progressive mass effect or evidence of acute hemorrhage. Electronically Signed   By: Margaretha Sheffield M.D.   On: 09/15/2021 12:24   MR BRAIN WO CONTRAST  Result Date: 09/14/2021 CLINICAL DATA:  Neuro deficit, acute, stroke suspected EXAM: MRI HEAD WITHOUT CONTRAST TECHNIQUE: Multiplanar, multiecho pulse sequences of the brain and surrounding structures were obtained without intravenous contrast. COMPARISON:  Same day CT and CTA. FINDINGS: Brain: Acute infarct in the left pons. Mild associated  edema. Mild for age additional scattered T2 hyperintensities in the white matter, nonspecific but compatible with chronic microvascular ischemic disease. 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 No acute hemorrhage, hydrocephalus, extra-axial fluid collection, mass lesion or midline shift. Small focus of susceptibility artifact in the left frontal white matter without edema, compatible with remote microhemorrhage. Vascular: Further evaluated on same day CTA. Skull and upper cervical  spine: Normal marrow signal. Sinuses/Orbits: Mild paranasal sinus mucosal thickening. Unremarkable orbits. Other: No mastoid effusions. IMPRESSION: 1. Acute infarct in the left pons.  Mild associated edema. 2. 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 3. Mild chronic microvascular ischemic disease. Electronically Signed   By: Margaretha Sheffield M.D.   On: 09/14/2021 19:04   CT CHEST ABDOMEN PELVIS W CONTRAST  Result Date: 09/14/2021 CLINICAL DATA:  Fall, right-sided weakness, right chest/abdominal pain EXAM: CT CHEST, ABDOMEN, AND PELVIS WITH CONTRAST TECHNIQUE: Multidetector CT imaging of the chest, abdomen and pelvis was performed following the standard protocol during bolus administration of intravenous contrast. CONTRAST:  13mL OMNIPAQUE IOHEXOL 350 MG/ML SOLN COMPARISON:  CT abdomen/pelvis dated 04/16/2021 FINDINGS: CT CHEST FINDINGS Cardiovascular: Heart is normal in size.  No pericardial effusion No evidence thoracic aortic aneurysm. Atherosclerotic calcifications of the aortic arch. 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 Mediastinum/Nodes: No suspicious mediastinal lymphadenopathy. Visualized thyroid is unremarkable. Lungs/Pleura: Eventration of left hemidiaphragm. Associated left lower lobe and right basilar atelectasis. No focal consolidation. No suspicious pulmonary nodules. No pleural effusion or pneumothorax. Musculoskeletal: Mild to moderate degenerative changes of the mid/lower thoracic spine. Cervical spine fixation hardware, incompletely visualized. No fracture is seen. Specifically, the right ribs are intact. CT ABDOMEN PELVIS FINDINGS Hepatobiliary: Macronodular hepatic contour. No focal hepatic lesion is seen. Status post cholecystectomy. No intrahepatic or extrahepatic ductal dilatation. Pancreas: Mild parenchymal atrophy. Spleen: Within normal limits. Adrenals/Urinary Tract: Adrenal glands are within normal limits. Early excretory contrast in the bilateral  renal collecting systems. 2.6 cm lateral left upper pole renal cyst (series 3/image 58). 15 mm medial right upper pole renal cyst (series 3/image 70). No hydronephrosis. Moderately distended bladder. Stomach/Bowel: Stomach is within normal limits. No evidence of bowel obstruction. Normal appendix (series 3/image 90). Left colonic diverticulosis, without evidence of diverticulitis. Vascular/Lymphatic: No evidence of abdominal aortic aneurysm. Atherosclerotic calcifications of the abdominal aorta and branch vessels. No suspicious abdominopelvic lymphadenopathy. Reproductive: Prostate is unremarkable. Other: No abdominopelvic ascites. Musculoskeletal: Moderate degenerative changes of the lumbar spine. Mild new superior endplate changes with loss of height at L4, chronic. No retropulsion. IMPRESSION: No evidence of traumatic injury to the chest, abdomen, or pelvis. Specifically, no right rib fracture is seen. No acute cardiopulmonary abnormality. Stable ancillary findings in the abdomen/pelvis. Electronically Signed   By: Julian Hy M.D.   On: 09/14/2021 20:32   EEG adult  Result Date: 09/14/2021 Greta Doom, MD     09/14/2021  3:56 PM History: 80 yo M with weakness and slurred speech Sedation: None Technique: This EEG was acquired with electrodes placed according to the International 10-20 electrode system (including Fp1, Fp2, F3, F4, C3, C4, P3, P4, O1, O2, T3, T4, T5, T6, A1, A2, Fz, Cz, Pz). The following electrodes were missing or displaced: none. Background: The background consists of intermixed alpha and beta activities. There is a well defined posterior dominant rhythm of 9 Hz that attenuates with eye opening. There is increasing delta with drowsiness. Sleep is recorded with normal appearing structures. Photic stimulation: Physiologic driving is not performed EEG Abnormalities: none Clinical Interpretation: This normal  EEG is recorded in the waking and sleep state. There was no seizure or  seizure predisposition recorded on this study. Please note that lack of epileptiform activity on EEG does not preclude the possibility of epilepsy. Roland Rack, MD [facility] [phone] If 7pm- 7am, please page neurology on call as listed in [place].   CT HEAD CODE STROKE WO CONTRAST  Result Date: 09/14/2021 CLINICAL DATA:  Code stroke.  Neuro deficit, acute, stroke suspected EXAM: CT HEAD WITHOUT CONTRAST TECHNIQUE: Contiguous axial images were obtained from the base of the skull through the vertex without intravenous contrast. COMPARISON:  12/29/2008. FINDINGS: Brain: No evidence of acute large vascular territory infarction, hemorrhage, hydrocephalus, extra-axial collection or mass lesion/mass effect. Remote right cerebellar infarct. Vascular: No hyperdense vessel identified. Skull: No acute fracture. Sinuses/Orbits: Mild paranasal sinus mucosal thickening. No acute orbital findings. Other: No mastoid effusions. ASPECTS [facility] Stroke Program Early CT Score) total score (0-10 with 10 being normal): 10. IMPRESSION: 1. No evidence of acute large vascular territory infarct or acute hemorrhage. ASPECTS is 10. 2. Remote right cerebellar infarct. Code stroke imaging results were communicated on 09/14/2021 at 1:32 pm to provider Dr. Theda Sers via secure text paging. CT head Electronically Signed   By: Margaretha Sheffield M.D.   On: 09/14/2021 13:33   CT ANGIO HEAD CODE STROKE  Result Date: 09/14/2021 CLINICAL DATA:  Neuro deficit, acute, stroke suspected; Stroke/TIA, assess extracranial arteries EXAM: CT ANGIOGRAPHY HEAD AND NECK TECHNIQUE: Multidetector CT imaging of the head and neck was performed using the standard protocol during bolus administration of intravenous contrast. Multiplanar CT image reconstructions and MIPs were obtained to evaluate the vascular anatomy. Carotid stenosis measurements (when applicable) are obtained utilizing NASCET criteria, using the distal internal carotid  diameter as the denominator. CONTRAST:  177mL OMNIPAQUE IOHEXOL 350 MG/ML SOLN COMPARISON:  None FINDINGS: CTA NECK Aortic arch: Mild calcified plaque along the arch and patent great vessel origins. There is aberrant origin of the right subclavian artery. No high-grade proximal subclavian stenosis. Right carotid system: Patent. Mixed plaque along the proximal internal carotid with nearly 50% stenosis. Left carotid system: Patent. Noncalcified plaque along the proximal internal carotid with less than 50% stenosis. Vertebral arteries: Left vertebral artery is patent. There is no significant enhancement of the right vertebral artery from the origin to the V3 segment where there is partial reconstitution Skeleton: Postoperative changes of anterior fusion at C3-C5 and C6-C7. Cervical spine degenerative changes. Other neck: Unremarkable. Upper chest: No apical lung mass. Review of the MIP images confirms the above findings CTA HEAD Anterior circulation: Intracranial internal carotid arteries are patent. There is calcified plaque along cavernous and supraclinoid portions mild to moderate stenosis. Anterior and middle cerebral arteries are patent. Posterior circulation: Intracranial right vertebral artery is patent but irregular with mixed plaque and areas of moderate to marked stenosis. The right PICA origin is patent. Intracranial left vertebral artery is patent. Is basilar artery is patent. Other major cerebellar artery origins are patent. Posterior cerebral arteries are patent. There is a right posterior communicating artery present. Venous sinuses: Patent as allowed by contrast bolus timing. 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 The intracranial portion is patent but regular with stenoses. Plaque at the proximal right ICA with nearly 50% stenosis. Plaque at the proximal left  ICA with less than 50% stenosis. Initial results were provided by telephone at the time of interpretation on  09/14/2021 at 2:00 pm to provider [facility] , who verbally acknowledged these results. Electronically Signed   By: Macy Mis M.D.   On: 09/14/2021 14:22   CT ANGIO NECK CODE STROKE  Result Date: 09/14/2021 CLINICAL DATA:  Neuro deficit, acute, stroke suspected; Stroke/TIA, assess extracranial arteries EXAM: CT ANGIOGRAPHY HEAD AND NECK TECHNIQUE: Multidetector CT imaging of the head and neck was performed using the standard protocol during bolus administration of intravenous contrast. Multiplanar CT image reconstructions and MIPs were obtained to evaluate the vascular anatomy. Carotid stenosis measurements (when applicable) are obtained utilizing NASCET criteria, using the distal internal carotid diameter as the denominator. CONTRAST:  184mL OMNIPAQUE IOHEXOL 350 MG/ML SOLN COMPARISON:  None FINDINGS: CTA NECK Aortic arch: Mild calcified plaque along the arch and patent great vessel origins. There is aberrant origin of the right subclavian artery. No high-grade proximal subclavian stenosis. Right carotid system: Patent. Mixed plaque along the proximal internal carotid with nearly 50% stenosis. Left carotid system: Patent. Noncalcified plaque along the proximal internal carotid with less than 50% stenosis. Vertebral arteries: Left vertebral artery is patent. There is no significant enhancement of the right vertebral artery from the origin to the V3 segment where there is partial reconstitution Skeleton: Postoperative changes of anterior fusion at C3-C5 and C6-C7. Cervical spine degenerative changes. Other neck: Unremarkable. Upper chest: No apical lung mass. Review of the MIP images confirms the above findings CTA HEAD Anterior circulation: Intracranial internal carotid arteries are patent. There is calcified plaque along cavernous and supraclinoid portions mild to moderate stenosis.  Anterior and middle cerebral arteries are patent. Posterior circulation: Intracranial right vertebral artery is patent but irregular with mixed plaque and areas of moderate to marked stenosis. The right PICA origin is patent. Intracranial left vertebral artery is patent. Is basilar artery is patent. Other major cerebellar artery origins are patent. Posterior cerebral arteries are patent. There is a right posterior communicating artery present. Venous sinuses: Patent as allowed by contrast bolus timing. 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 The intracranial portion is patent but regular with stenoses. Plaque at the proximal right ICA with nearly 50% stenosis. Plaque at the proximal left ICA with less than 50% stenosis. Initial results were provided by telephone at the time of interpretation on 09/14/2021 at 2:00 pm to provider [facility] , who verbally acknowledged these results. Electronically Signed   By: Macy Mis M.D.   On: 09/14/2021 14:22     PHYSICAL EXAM  Temp:  [98 F (36.7 C)-98.8 F (37.1 C)] 98.8 F (37.1 C) (10/16 1100) Pulse Rate:  [72-89] 89 (10/16 1200) Resp:  [15-29] 20 (10/16 1200) BP: (111-189)/(74-109) 162/89 (10/16 1200) SpO2:  [90 %-98 %] 95 % (10/16 1200) Weight:  [94.6 kg] 94.6 kg (10/15 1300)  General - Well nourished, well developed, in no apparent distress.  Ophthalmologic - fundi not visualized due to noncooperation.  Cardiovascular - Regular rhythm and rate.  Neuro - awake, alert, eyes open, orientated to age, place, time and people. No aphasia, moderate to severe dysarthria, following all simple commands. Able to name and repeat with dysarthric voice. No gaze palsy but with left gaze he has left directed nystagmus, tracking bilaterally, visual field full, PERRL. Right facial droop. Tongue protrusion to the  right. LUE 4+/5 and LLE 3/5 proximal and 5/5 distal. Sensation symmetrical bilaterally, left FTN intact grossly, gait not tested.  ASSESSMENT/PLAN Mr. DENZAL MEIR is a 80 y.o. male with history of diabetes, hypertension, hyperlipidemia, hep C admitted for right facial droop, right-sided weakness and slurred speech.  TNK given.  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  Patient counseled to be compliant with his antithrombotic medications Ongoing aggressive stroke risk factor management Therapy recommendations: Pending Disposition: Pending  Diabetes HgbA1c 7.4 goal < 7.0 Uncontrolled CBG monitoring SSI DM education and close PCP follow up  Hypertension Stable on the high end Labetalol IV as needed Will start p.o. BP meds after p.o. access Long term BP goal normotensive  Hyperlipidemia Home meds: Lipitor 20 LDL 109, goal < 70 Now on Lipitor 40 Continue statin at discharge  Dysphagia Did not pass swallow Speech on board Modified barium study tomorrow IV fluid  Other Stroke Risk Factors Advanced age  Other Active Problems Hep C Right shoulder pain - X-ray pending Right knee pain/swelling - X-ray pending  Hospital day # 1  This patient is critically ill due to acute stroke status post TN K, hypertensive urgency, dysphagia and at significant risk of neurological worsening, death form recurrent stroke, hemorrhagic conversion, hypertensive encephalopathy, sepsis, aspiration. This patient's care requires constant monitoring of vital signs, hemodynamics, respiratory and cardiac monitoring, review of multiple databases, neurological  assessment, discussion with family, other specialists and medical decision making of high complexity. I spent 45 minutes of neurocritical care time in the care of this patient. I had long discussion with wife and daughter at bedside, updated pt current condition, treatment plan and potential prognosis, and answered all the questions.  They expressed understanding and appreciation.   Rosalin Hawking, MD PhD Stroke Neurology 09/15/2021 12:32 PM    To contact Stroke Continuity provider, please refer to [email or web address] After hours, contact General Neurology

## 2021-09-15 NOTE — Progress Notes (Signed)
On admission to the unit, patient was being transferred my the transport team. This patient had been given TNK and was not accompanied by a RN nor did they have any monitors attached to the patient. The patient was coming from MRI, and I was told there was no RN in MRI with the patient.

## 2021-09-16 ENCOUNTER — Other Ambulatory Visit (HOSPITAL_COMMUNITY): Payer: Medicare Other

## 2021-09-16 ENCOUNTER — Inpatient Hospital Stay (HOSPITAL_COMMUNITY): Payer: Medicare Other

## 2021-09-16 DIAGNOSIS — M25461 Effusion, right knee: Secondary | ICD-10-CM

## 2021-09-16 DIAGNOSIS — M10061 Idiopathic gout, right knee: Secondary | ICD-10-CM

## 2021-09-16 DIAGNOSIS — M25561 Pain in right knee: Secondary | ICD-10-CM

## 2021-09-16 DIAGNOSIS — I10 Essential (primary) hypertension: Secondary | ICD-10-CM

## 2021-09-16 LAB — CBC
HCT: 40.4 % (ref 39.0–52.0)
Hemoglobin: 13.5 g/dL (ref 13.0–17.0)
MCH: 30 pg (ref 26.0–34.0)
MCHC: 33.4 g/dL (ref 30.0–36.0)
MCV: 89.8 fL (ref 80.0–100.0)
Platelets: 234 10*3/uL (ref 150–400)
RBC: 4.5 MIL/uL (ref 4.22–5.81)
RDW: 12.9 % (ref 11.5–15.5)
WBC: 13.5 10*3/uL — ABNORMAL HIGH (ref 4.0–10.5)
nRBC: 0 % (ref 0.0–0.2)

## 2021-09-16 LAB — BASIC METABOLIC PANEL
Anion gap: 8 (ref 5–15)
BUN: 14 mg/dL (ref 8–23)
CO2: 22 mmol/L (ref 22–32)
Calcium: 8.8 mg/dL — ABNORMAL LOW (ref 8.9–10.3)
Chloride: 105 mmol/L (ref 98–111)
Creatinine, Ser: 0.83 mg/dL (ref 0.61–1.24)
GFR, Estimated: >60 mL/min (ref >60)
Glucose, Bld: 169 mg/dL — ABNORMAL HIGH (ref 70–99)
Potassium: 3.3 mmol/L — ABNORMAL LOW (ref 3.5–5.1)
Sodium: 135 mmol/L (ref 135–145)

## 2021-09-16 LAB — GLUCOSE, CAPILLARY
Glucose-Capillary: 156 mg/dL — ABNORMAL HIGH (ref 70–99)
Glucose-Capillary: 158 mg/dL — ABNORMAL HIGH (ref 70–99)
Glucose-Capillary: 168 mg/dL — ABNORMAL HIGH (ref 70–99)
Glucose-Capillary: 189 mg/dL — ABNORMAL HIGH (ref 70–99)
Glucose-Capillary: 191 mg/dL — ABNORMAL HIGH (ref 70–99)
Glucose-Capillary: 192 mg/dL — ABNORMAL HIGH (ref 70–99)
Glucose-Capillary: 202 mg/dL — ABNORMAL HIGH (ref 70–99)
Glucose-Capillary: 234 mg/dL — ABNORMAL HIGH (ref 70–99)

## 2021-09-16 LAB — LIPID PANEL
Cholesterol: 193 mg/dL (ref 0–200)
HDL: 44 mg/dL (ref >40)
LDL Cholesterol: 88 mg/dL (ref 0–99)
Total CHOL/HDL Ratio: 4.4 RATIO
Triglycerides: 305 mg/dL — ABNORMAL HIGH (ref <150)
VLDL: 61 mg/dL — ABNORMAL HIGH (ref 0–40)

## 2021-09-16 MED ORDER — CLOPIDOGREL BISULFATE 75 MG PO TABS
75.0000 mg | ORAL_TABLET | Freq: Every day | ORAL | Status: DC
  Administered 2021-09-16 – 2021-09-17 (×2): 75 mg via ORAL
  Filled 2021-09-16 (×2): qty 1

## 2021-09-16 MED ORDER — IRBESARTAN 300 MG PO TABS
300.0000 mg | ORAL_TABLET | Freq: Every day | ORAL | Status: DC
  Administered 2021-09-16 – 2021-09-17 (×2): 300 mg via ORAL
  Filled 2021-09-16 (×2): qty 1

## 2021-09-16 MED ORDER — AMLODIPINE BESYLATE 10 MG PO TABS
10.0000 mg | ORAL_TABLET | Freq: Every day | ORAL | Status: DC
  Administered 2021-09-17: 10 mg via ORAL
  Filled 2021-09-16: qty 1

## 2021-09-16 MED ORDER — LIDOCAINE 5 % EX PTCH: 1.0000 | MEDICATED_PATCH | CUTANEOUS | Status: DC

## 2021-09-16 MED ORDER — CLONIDINE HCL 0.1 MG PO TABS
0.1000 mg | ORAL_TABLET | Freq: Two times a day (BID) | ORAL | Status: DC
  Administered 2021-09-16 – 2021-09-17 (×2): 0.1 mg via ORAL
  Filled 2021-09-16 (×2): qty 1

## 2021-09-16 MED ORDER — COLCHICINE 0.6 MG PO TABS
0.6000 mg | ORAL_TABLET | Freq: Every day | ORAL | Status: DC
  Administered 2021-09-17: 0.6 mg via ORAL
  Filled 2021-09-16: qty 1

## 2021-09-16 MED ORDER — COLCHICINE 0.6 MG PO TABS
1.2000 mg | ORAL_TABLET | ORAL | Status: AC
  Administered 2021-09-16: 1.2 mg via ORAL
  Filled 2021-09-16: qty 2

## 2021-09-16 MED ORDER — PREDNISONE 5 MG PO TABS
30.0000 mg | ORAL_TABLET | Freq: Once | ORAL | Status: AC
  Administered 2021-09-16: 30 mg via ORAL
  Filled 2021-09-16: qty 2

## 2021-09-16 MED ORDER — LABETALOL HCL 5 MG/ML IV SOLN: 10.0000 mg | INTRAVENOUS | Status: DC | PRN

## 2021-09-16 MED ORDER — ALLOPURINOL 100 MG PO TABS
100.0000 mg | ORAL_TABLET | Freq: Every day | ORAL | Status: DC
  Administered 2021-09-16 – 2021-09-17 (×2): 100 mg via ORAL
  Filled 2021-09-16 (×2): qty 1

## 2021-09-16 NOTE — Evaluation (Signed)
Occupational Therapy Evaluation Patient Details Name: Larry Robertson MRN: [id number] DOB: [birth year] Today's Date: 09/16/2021   History of Present Illness 80yo male who presented on 10/15 after a witnessed fall at home, family recognized stroke symptoms and called 911. 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 Received TNK 10/15. PMH skin CA, DM, diastolic dysfunction, hepatitis C, HTN   Clinical Impression   PTA, pt was living with his daughters and was independent; enjoyed going to the gym on Sundays. Pt currently requiring Mod-Max A for UB ADLs, Max A for LB ADLs, and Max A +2 for sit<>stand with stedy. Pt presenting with decreased balance, strength, functional use of RUE, and speech. Pt very motivated and eager to participate in therapy; reports he has good family support. Due to pt's motivation and change in function, highly recommend dc to CIR for intensive OT to optimize safety and independence as well as decrease caregiver burden.  Will continue to follow acutely as admitted to facilitate safe dc.      Recommendations for follow up therapy are one component of a multi-disciplinary discharge planning process, led by the attending physician.  Recommendations may be updated based on patient status, additional functional criteria and insurance authorization.   Follow Up Recommendations  CIR    Equipment Recommendations  3 in 1 bedside commode;Wheelchair (measurements OT);Wheelchair cushion (measurements OT)    Recommendations for Other Services PT consult;Speech consult;Rehab consult     Precautions / Restrictions Precautions Precautions: Fall;Other (comment) Precaution Comments: R hemi, poor balance Restrictions Weight Bearing Restrictions: No      Mobility Bed Mobility Overal bed mobility: Needs Assistance Bed Mobility: Supine to Sit     Supine to sit: Max assist;+2 for physical assistance;HOB elevated     General bed mobility comments: able to move L LE along bed a bit, needed  help managing R LE as well as trunk even with cues for sequencing; heavy posterior lean once up and tends to overcorrect anteriorly    Transfers Overall transfer level: Needs assistance Equipment used: Ambulation equipment used Transfers: Sit to/from Stand Sit to Stand: Max assist;+2 physical assistance;From elevated surface Stand pivot transfers: Total assist;+2 physical assistance       General transfer comment: MaxAx2 to boost up from elevated bed and elevated stedy seat; needed heavy Mod facilitation to shift weight onto L LE and keep it there during functional transfers/transitions. totalAx2 to pivot in stedy    Balance Overall balance assessment: Needs assistance Sitting-balance support: Feet supported;Single extremity supported Sitting balance-Leahy Scale: Poor Sitting balance - Comments: Mod cues and facilitation for upright/midline sitting Postural control: Posterior lean;Right lateral lean Standing balance support: Single extremity supported;During functional activity Standing balance-Leahy Scale: Poor Standing balance comment: maxAx2 dynamically                           ADL either performed or assessed with clinical judgement   ADL Overall ADL's : Needs assistance/impaired Eating/Feeding: NPO   Grooming: Wash/dry face;Moderate assistance;Sitting Grooming Details (indicate cue type and reason): Support at L elbow to bring hadn to face. Upper Body Bathing: Moderate assistance;Sitting   Lower Body Bathing: Maximal assistance;Sit to/from stand   Upper Body Dressing : Maximal assistance;Sitting   Lower Body Dressing: Maximal assistance;Sit to/from stand   Toilet Transfer: Maximal assistance (use of stedy with sit<>stand)           Functional mobility during ADLs: Maximal assistance;+2 for physical assistance (sit<>stand with stedy) General ADL Comments:  Pt very motivated. Presenting with decreased balance, strength, and functional use of R side      Vision Baseline Vision/History: 0 No visual deficits Additional Comments: Denies any changes in vision. Will need to further assess     Perception     Praxis      Pertinent Vitals/Pain Pain Assessment: Faces Faces Pain Scale: Hurts even more Pain Location: R UE Pain Descriptors / Indicators: Aching;Discomfort Pain Intervention(s): Monitored during session;Limited activity within patient's tolerance;Repositioned     Hand Dominance Right   Extremity/Trunk Assessment Upper Extremity Assessment Upper Extremity Assessment: RUE deficits/detail;LUE deficits/detail RUE Deficits / Details: No active movement. Reports he feels light touch. RUE Coordination: decreased fine motor;decreased gross motor LUE Deficits / Details: Difficulty with shoulder forward flexion due to weakness. abel to bring wash clothe to face with support at elbow due to weakness and fatigue LUE Coordination: decreased gross motor;decreased fine motor   Lower Extremity Assessment Lower Extremity Assessment: Defer to PT evaluation RLE Deficits / Details: approximately 2/5 grossly based on functional task performance RLE Sensation: decreased proprioception RLE Coordination: decreased gross motor LLE Deficits / Details: approximately 3+/5 to 4-/5 based on functional task performance   Cervical / Trunk Assessment Cervical / Trunk Assessment: Normal   Communication Communication Communication: No difficulties   Cognition Arousal/Alertness: Awake/alert Behavior During Therapy: Flat affect Overall Cognitive Status: Within Functional Limits for tasks assessed                                 General Comments: a bit difficult to understand due to slurred speech- but able to recall past events accurately from prior hospital stays and followed cues well.   General Comments  BP elevated but stable during session, RN aware    Exercises Exercises: General Upper Extremity;General Lower  Extremity General Exercises - Upper Extremity Shoulder Flexion: PROM;Right;10 reps;Supine Elbow Flexion: PROM;Right;10 reps;Supine Elbow Extension: PROM;Right;10 reps;Supine Wrist Flexion: PROM;Right;10 reps;Supine Wrist Extension: PROM;Right;10 reps;Seated General Exercises - Lower Extremity Heel Slides: PROM;Right;10 reps;Supine   Shoulder Instructions      Home Living Family/patient expects to be discharged to:: Private residence Living Arrangements: Children Available Help at Discharge: Family;Available 24 hours/day (daughter) Type of Home: House Home Access: Level entry     Home Layout: One level     Bathroom Shower/Tub: Tub/shower unit;Curtain   Biochemist, clinical: Standard     Home Equipment: Grab bars - tub/shower   Additional Comments: information confirmed with chart review.      Prior Functioning/Environment Level of Independence: Independent        Comments: Pt reporting he performed ADLs, IADLs, and worked out at [employer] each Sunday.        OT Problem List: Decreased strength;Decreased activity tolerance;Decreased range of motion;Impaired balance (sitting and/or standing);Decreased knowledge of use of DME or AE;Decreased knowledge of precautions;Pain;Impaired UE functional use      OT Treatment/Interventions: Self-care/ADL training;Therapeutic exercise;Energy conservation;DME and/or AE instruction;Therapeutic activities;Patient/family education    OT Goals(Current goals can be found in the care plan section) Acute Rehab OT Goals Patient Stated Goal: get better/rehab OT Goal Formulation: With patient Time For Goal Achievement: 09/30/21 Potential to Achieve Goals: Good  OT Frequency: Min 2X/week   Barriers to D/C:            Co-evaluation              AM-PAC OT "6 Clicks" Daily Activity     Outcome  Measure Help from another person eating meals?: Total Help from another person taking care of personal grooming?: A Lot Help from another  person toileting, which includes using toliet, bedpan, or urinal?: A Lot Help from another person bathing (including washing, rinsing, drying)?: A Lot Help from another person to put on and taking off regular upper body clothing?: A Lot Help from another person to put on and taking off regular lower body clothing?: A Lot 6 Click Score: 11   End of Session Equipment Utilized During Treatment: Gait belt Nurse Communication: Mobility status;Need for lift equipment  Activity Tolerance: Patient tolerated treatment well Patient left: in chair;with call bell/phone within reach;with chair alarm set  OT Visit Diagnosis: Unsteadiness on feet (R26.81);Other abnormalities of gait and mobility (R26.89);Muscle weakness (generalized) (M62.81);Pain;Hemiplegia and hemiparesis Hemiplegia - Right/Left: Right Hemiplegia - dominant/non-dominant: Dominant Hemiplegia - caused by: Cerebral infarction Pain - Right/Left: Right Pain - part of body: Arm;Leg                Time: 7672-0947 OT Time Calculation (min): 37 min Charges:  OT General Charges $OT Visit: 1 Visit OT Evaluation $OT Eval Moderate Complexity: [facility], OTR/L Acute Rehab Pager: [phone] Office: [place] 09/16/2021, 1:02 PM

## 2021-09-16 NOTE — TOC Initial Note (Signed)
Transition of Care [facility]) - Initial/Assessment Note    Patient Details  Name: Larry Robertson MRN: [id number] Date of Birth: Jul 23, 1941  Transition of [facility]) CM/SW Contact:    Geralynn Ochs, LCSW Phone Number: 09/16/2021, 3:11 PM  Clinical Narrative:         CSW notified by Rehab Admissions that patient will need SNF at discharge. CSW has faxed out referral, will follow up with patient and daughter on bed offers.          Expected Discharge Plan: Skilled Nursing Facility Barriers to Discharge: Insurance Authorization   Patient Goals and CMS Choice Patient states their goals for this hospitalization and ongoing recovery are:: to get rehab CMS Medicare.gov Compare Post Acute Care list provided to:: Patient Represenative (must comment) Choice offered to / list presented to : Adult Children  Expected Discharge Plan and Services Expected Discharge Plan: [place] Acute Care Choice: [place] Living arrangements for the past 2 months: Single Family Home                                      Prior Living Arrangements/Services Living arrangements for the past 2 months: Single Family Home Lives with:: Self Patient language and need for interpreter reviewed:: No Do you feel safe going back to the place where you live?: Yes      Need for Family Participation in Patient Care: Yes (Comment) Care giver support system in place?: No (comment)   Criminal Activity/Legal Involvement Pertinent to Current Situation/Hospitalization: No - Comment as needed  Activities of Daily Living Home Assistive Devices/Equipment: None ADL Screening (condition at time of admission) Patient's cognitive ability adequate to safely complete daily activities?: No Is the patient deaf or have difficulty hearing?: No Does the patient have difficulty seeing, even when wearing glasses/contacts?: No Does the patient have difficulty concentrating, remembering, or  making decisions?: No Does the patient have difficulty dressing or bathing?: No Independently performs ADLs?: Yes (appropriate for developmental age) Weakness of Legs: Right Weakness of Arms/Hands: Right  Permission Sought/Granted Permission sought to share information with : Facility Sport and exercise psychologist, Family Supports Permission granted to share information with : Yes, Verbal Permission Granted  Share Information with NAME: Verdis Frederickson  Permission granted to share info w AGENCY: SNF  Permission granted to share info w Relationship: Daughter     Emotional Assessment Appearance:: Appears stated age Attitude/Demeanor/Rapport: Engaged Affect (typically observed): Appropriate Orientation: : Oriented to Self, Oriented to Place, Oriented to  Time, Oriented to Situation Alcohol / Substance Use: Not Applicable Psych Involvement: No (comment)  Admission diagnosis:  Stroke [facility]) [I63.9] Cerebrovascular accident (CVA), unspecified mechanism ([place]) [I63.9] Patient Active Problem List   Diagnosis Date Noted   Stroke ([place]) 09/14/2021   Herniated thoracic disc without myelopathy 07/05/2021   Elevated triglycerides with high cholesterol 04/18/2021   Other chest pain 04/11/2021   CKD (chronic kidney disease) stage 2, GFR 60-89 ml/min 12/16/2019   B12 deficiency 12/16/2019   Iron overload 12/16/2019   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   Macrocytosis 10/18/2019   Anemia 09/19/2019   Need for influenza vaccination 09/19/2019   Breast mass 09/15/2019   Breast tenderness in male 09/15/2019   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   OA (osteoarthritis) of knee 06/17/2019   History of hepatitis C 06/14/2019   Elevated cholesterol 06/14/2019   Cirrhosis of  liver without ascites ([place]) 06/14/2019   Labral tear of shoulder, degenerative, left 08/23/2018   Contracture of joint of left shoulder region 08/23/2018   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/23/2018   Chronic pain  syndrome 08/23/2018   Herniated cervical intervertebral disc 03/04/2018   Narcotic drug use 11/23/2017   Dyslipidemia 10/01/2017   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/01/2017   Atrial fibrillation ([place]) 06/08/2017   Thrombocytopenia ([place]) 10/15/2016   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   Nocturia 08/09/2014   Hematuria 08/09/2014   Other dysphagia 05/18/2014   Personal history of colonic polyps 07/13/2013   Nonspecific abnormal electrocardiogram (ECG) (EKG) 04/20/2013   Encounter for long-term (current) use of other medications 04/15/2012   Healthcare maintenance 04/15/2012   Radiculopathy of leg 10/08/2011   Blurry vision 10/08/2011   Special screening, prostate cancer 04/09/2011   Lumbago 04/09/2011   OTHER SPEC HYPERTROPHIC&ATROPHIC CONDITION SKIN 02/06/2010   FOOT PAIN, LEFT 02/06/2010   HYPOGONADISM 09/27/2009   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 [place] [address] Phone: [phone] Fax: [phone]     Social Determinants of Health (SDOH) Interventions    Readmission Risk Interventions No flowsheet data found.

## 2021-09-16 NOTE — Progress Notes (Signed)
Modified Barium Swallow Progress Note  Patient Details  Name: Larry Robertson MRN: [id number] Date of Birth: [birth year]  Today's Date: 09/16/2021  Modified Barium Swallow completed.  Full report located under Chart Review in the Imaging Section.  Brief recommendations include the following:  Clinical Impression  Pt demonstrates a primarily oral dysphagia with decreased lingual movement for bolus formation and propulsion with moderate buccal and floor of mouth residue on the right as well as anterior spillage. After oral care, removal of dentures and some practice, pt was able to significantly improve oral control of boluses. Thin liquids spill posteriorally resulting in inconsistent sensation of trace aspriation before the swallow. With nectar thick liquids, bolus cohesion is better and pt does not aspirate unless bolus is larger with spillage over the epiglottis, which only occurred with straw sips. Small cups sips and spoonfuls toelrated well. Puree tolerated well. Did not trial solids at this time but expect pt could attempt soft solids when lingual mucosa moist, dentures in place and pt demonstrates independent management of right oral residue. Pt recommended to initaite puree and nectar thick liquids (no straw) with in-room suction set up. Recommend CIR at d/c.   Swallow Evaluation Recommendations       SLP Diet Recommendations: Dysphagia 1 (Puree) solids;Nectar thick liquid   Liquid Administration via: Cup;Spoon   Medication Administration: Crushed with puree   Supervision: Patient able to self feed           Oral Care Recommendations: Oral care before and after PO   Other Recommendations: Order thickener from pharmacy;Have oral suction available    Madellyn Denio, Katherene Ponto 09/16/2021,1:54 PM

## 2021-09-16 NOTE — NC FL2 (Signed)
[place] LEVEL OF CARE SCREENING TOOL     IDENTIFICATION  Patient Name: Larry Robertson Birthdate: 07-27-1941 Sex: male Admission Date (Current Location): 09/14/2021  [facility] and [place] Number:  Herbalist and Address:  [facility], [place] [address], [place],  [address]      Provider Number: [id number]  Attending Physician Name and Address:  Stroke, Md, MD  Relative Name and Phone Number:       Current Level of Care: Hospital Recommended Level of Care: [place] Prior Approval Number:    Date Approved/Denied:   PASRR Number: [id number]  Discharge Plan: SNF    Current Diagnoses: Patient Active Problem List   Diagnosis Date Noted   Stroke ([place]) 09/14/2021   Herniated thoracic disc without myelopathy 07/05/2021   Elevated triglycerides with high cholesterol 04/18/2021   Other chest pain 04/11/2021   CKD (chronic kidney disease) stage 2, GFR 60-89 ml/min 12/16/2019   B12 deficiency 12/16/2019   Iron overload 12/16/2019   Stage 3b chronic kidney disease ([place]) 10/18/2019   Macrocytosis 10/18/2019   Anemia 09/19/2019   Need for influenza vaccination 09/19/2019   Breast mass 09/15/2019   Breast tenderness in male 09/15/2019   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   OA (osteoarthritis) of knee 06/17/2019   History of hepatitis C 06/14/2019   Elevated cholesterol 06/14/2019   Cirrhosis of liver without ascites ([place]) 06/14/2019   Labral tear of shoulder, degenerative, left 08/23/2018   Contracture of joint of left shoulder region 08/23/2018   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/23/2018   Chronic pain syndrome 08/23/2018   Herniated cervical intervertebral disc 03/04/2018   Narcotic drug use 11/23/2017   Dyslipidemia 10/01/2017   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/01/2017   Atrial fibrillation ([place]) 06/08/2017   Thrombocytopenia ([place]) 10/15/2016   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   Nocturia  08/09/2014   Hematuria 08/09/2014   Other dysphagia 05/18/2014   Personal history of colonic polyps 07/13/2013   Nonspecific abnormal electrocardiogram (ECG) (EKG) 04/20/2013   Encounter for long-term (current) use of other medications 04/15/2012   Healthcare maintenance 04/15/2012   Radiculopathy of leg 10/08/2011   Blurry vision 10/08/2011   Special screening, prostate cancer 04/09/2011   Lumbago 04/09/2011   OTHER SPEC HYPERTROPHIC&ATROPHIC CONDITION SKIN 02/06/2010   FOOT PAIN, LEFT 02/06/2010   HYPOGONADISM 09/27/2009   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   Left sided abdominal pain of unknown cause 07/17/2009   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   Acute hepatitis C virus infection 08/25/2007   Diabetes ([place]) 08/25/2007   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   ERECTILE DYSFUNCTION 08/25/2007   Essential hypertension 08/25/2007    Orientation RESPIRATION BLADDER Height & Weight     Self, Time, Situation, Place  Normal Continent Weight: 208 lb 8.9 oz (94.6 kg) Height:     BEHAVIORAL SYMPTOMS/MOOD NEUROLOGICAL BOWEL NUTRITION STATUS      Continent Diet (see DC summary)  AMBULATORY STATUS COMMUNICATION OF NEEDS Skin   Extensive Assist Verbally Skin abrasions                       Personal Care Assistance Level of Assistance  Bathing, Feeding, Dressing Bathing Assistance: Maximum assistance Feeding assistance: Limited assistance Dressing Assistance: Maximum assistance     Functional Limitations Info  Speech     Speech Info: Impaired (dysarthria)    SPECIAL CARE FACTORS FREQUENCY  PT (By licensed PT), OT (By licensed OT), Speech therapy  PT Frequency: 5x/wk OT Frequency: 5x/wk     Speech Therapy Frequency: 5x/wk      Contractures Contractures Info: Not present    Additional Factors Info  Code Status, Allergies, Insulin Sliding Scale Code Status Info: Full Allergies Info: Lyrica (Pregabalin), Gabapentin   Insulin Sliding Scale Info: see DC summary        Current Medications (09/16/2021):  This is the current hospital active medication list Current Facility-Administered Medications  Medication Dose Route Frequency Provider Last Rate Last Admin    stroke: mapping our early stages of recovery book   Does not apply Once Kirby-Graham, Karsten Fells, NP       0.9 %  sodium chloride infusion   Intravenous Continuous Kirby-Graham, Karsten Fells, NP 75 mL/hr at 09/16/21 0119 New Bag at 09/16/21 0119   acetaminophen (TYLENOL) tablet 650 mg  650 mg Oral Q4H PRN Gardiner Barefoot, NP       Or   acetaminophen (TYLENOL) 160 MG/5ML solution 650 mg  650 mg Per Tube Q4H PRN Kirby-Graham, Karsten Fells, NP       Or   acetaminophen (TYLENOL) suppository 650 mg  650 mg Rectal Q4H PRN Gardiner Barefoot, NP   650 mg at 09/16/21 0111   [START ON 09/17/2021] amLODipine (NORVASC) tablet 10 mg  10 mg Oral Daily Bailey-Modzik, Delila A, NP       aspirin EC tablet 81 mg  81 mg Oral Daily Rosalin Hawking, MD       Or   aspirin suppository 300 mg  300 mg Rectal Daily Rosalin Hawking, MD   300 mg at 09/16/21 7124   atorvastatin (LIPITOR) tablet 40 mg  40 mg Oral Daily Kirby-Graham, Karsten Fells, NP       chlorhexidine (PERIDEX) 0.12 % solution 15 mL  15 mL Mouth Rinse BID Rosalin Hawking, MD   15 mL at 09/16/21 5809   Chlorhexidine Gluconate Cloth 2 % PADS 6 each  6 each Topical Daily Gwinda Maine, MD   6 each at 09/15/21 2121   enoxaparin (LOVENOX) injection 40 mg  40 mg Subcutaneous Q24H Rosalin Hawking, MD   40 mg at 09/16/21 1345   insulin aspart (novoLOG) injection 0-9 Units  0-9 Units Subcutaneous Q4H Rosalin Hawking, MD   2 Units at 09/16/21 1344   irbesartan (AVAPRO) tablet 300 mg  300 mg Oral Daily Bailey-Modzik, Delila A, NP       labetalol (NORMODYNE) injection 10 mg  10 mg Intravenous Q2H PRN Bailey-Modzik, Delila A, NP       lidocaine (LIDODERM) 5 % 1 patch  1 patch Transdermal Q24H Bailey-Modzik, Delila A, NP       MEDLINE mouth rinse  15 mL Mouth Rinse q12n4p Rosalin Hawking, MD   15  mL at 09/16/21 1345   pantoprazole (PROTONIX) injection 40 mg  40 mg Intravenous QHS Gardiner Barefoot, NP   40 mg at 09/15/21 2121   senna-docusate (Senokot-S) tablet 1 tablet  1 tablet Oral BID Gardiner Barefoot, NP         Discharge Medications: Please see discharge summary for a list of discharge medications.  Relevant Imaging Results:  Relevant Lab Results:   Additional Information SS#: [id number]  Geralynn Ochs, LCSW

## 2021-09-16 NOTE — Progress Notes (Addendum)
STROKE TEAM PROGRESS NOTE   SUBJECTIVE (INTERVAL HISTORY) No acute events overnight. Tmax 98. BP remains elevated, glucose stable. Passed for a po dysphagia diet. Did not complain of shoulder or knee pain during visit today. He stated understanding he is awaiting rehab. He had no questions.   OBJECTIVE Temp:  [97.7 F (36.5 C)-98.4 F (36.9 C)] 98 F (36.7 C) (10/17 1131) Pulse Rate:  [83-94] 85 (10/17 1131) Cardiac Rhythm: Normal sinus rhythm (10/17 0806) Resp:  [20-25] 23 (10/17 0345) BP: (145-179)/(88-154) 167/97 (10/17 1131) SpO2:  [92 %-97 %] 96 % (10/17 1131)  Recent Labs  Lab 09/16/21 0011 09/16/21 0337 09/16/21 0840 09/16/21 1130 09/16/21 1340  GLUCAP 189* 168* 158* 156* 191*   Recent Labs  Lab 09/14/21 1318 09/14/21 1329 09/15/21 0810 09/16/21 0342  NA 133* 132* 135 135  K 3.6 3.9 4.0 3.3*  CL 99 103 102 105  CO2 20*  --  20* 22  GLUCOSE 161* 169* 181* 169*  BUN 17 19 16 14   CREATININE 0.84 0.60* 0.84 0.83  CALCIUM 9.1  --  9.0 8.8*   Recent Labs  Lab 09/14/21 1318  AST 60*  ALT 36  ALKPHOS 47  BILITOT 1.5*  PROT 6.8  ALBUMIN 4.2   Recent Labs  Lab 09/14/21 1318 09/14/21 1329 09/15/21 0810 09/16/21 0342  WBC 17.6*  --  14.9* 13.5*  NEUTROABS 14.9*  --   --   --   HGB 14.9 16.7 14.4 13.5  HCT 45.5 49.0 43.3 40.4  MCV 90.3  --  89.6 89.8  PLT 238  --  267 234   No results for input(s): CKTOTAL, CKMB, CKMBINDEX, TROPONINI in the last 168 hours. Recent Labs    09/14/21 1318  LABPROT 14.5  INR 1.1   No results for input(s): COLORURINE, LABSPEC, PHURINE, GLUCOSEU, HGBUR, BILIRUBINUR, KETONESUR, PROTEINUR, UROBILINOGEN, NITRITE, LEUKOCYTESUR in the last 72 hours.  Invalid input(s): APPERANCEUR     Component Value Date/Time   CHOL 193 09/16/2021 0342   CHOL 128 01/21/2018 1306   TRIG 305 (H) 09/16/2021 0342   HDL 44 09/16/2021 0342   HDL 42 01/21/2018 1306   CHOLHDL 4.4 09/16/2021 0342   VLDL 61 (H) 09/16/2021 0342   LDLCALC 88  09/16/2021 0342   LDLCALC 39 01/21/2018 1306   Lab Results  Component Value Date   HGBA1C 6.9 (H) 09/15/2021   No results found for: LABOPIA, COCAINSCRNUR, LABBENZ, AMPHETMU, THCU, LABBARB  No results for input(s): ETH in the last 168 hours.  I have personally reviewed the radiological images below and agree with the radiology interpretations.  DG Shoulder Right  Result Date: 09/15/2021 CLINICAL DATA:  Found down, pain EXAM: RIGHT SHOULDER - 2+ VIEW COMPARISON:  None. FINDINGS: There is no evidence of fracture or dislocation. Mild acromioclavicular and glenohumeral arthrosis. Soft tissues are unremarkable. IMPRESSION: No fracture or dislocation of the right shoulder. Mild acromioclavicular and glenohumeral arthrosis. Electronically Signed   By: Delanna Ahmadi M.D.   On: 09/15/2021 14:56   DG Knee 1-2 Views Right  Result Date: 09/15/2021 CLINICAL DATA:  Soft tissue swelling right knee.  No known injury. EXAM: RIGHT KNEE - 1-2 VIEW COMPARISON:  None. FINDINGS: Moderate joint effusion. Degenerative changes with spurring throughout the right knee, most pronounced in the lateral and patellofemoral compartments. No acute bony abnormality. Specifically, no fracture, subluxation, or dislocation. IMPRESSION: Moderate degenerative changes. Moderate joint effusion. No acute bony abnormality. Electronically Signed   By: Rolm Baptise M.D.   On:  09/15/2021 19:07   CT HEAD WO CONTRAST (5MM)  Result Date: 09/15/2021 CLINICAL DATA:  Stroke, follow up EXAM: CT HEAD WITHOUT CONTRAST TECHNIQUE: Contiguous axial images were obtained from the base of the skull through the vertex without intravenous contrast. COMPARISON:  September 14, 2021. FINDINGS: Brain: New edema in the left hemi pons, compatible with known acute infarct seen on MRI. No progressive mass effect or evidence of acute hemorrhage. 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 Mild scattered additional white matter hypodensities, nonspecific but compatible  with chronic microvascular disease. No hydrocephalus, extra-axial fluid collection, mass lesion, or midline shift. Vascular: No hyperdense vessel identified. Skull: No acute fracture. Sinuses/Orbits: Visualized sinuses are clear.  Unremarkable orbits. Other: No mastoid effusions. IMPRESSION: New edema in the left hemi pons, compatible with known acute infarct seen on MRI. No progressive mass effect or evidence of acute hemorrhage. Electronically Signed   By: Margaretha Sheffield M.D.   On: 09/15/2021 12:24   MR BRAIN WO CONTRAST  Result Date: 09/14/2021 CLINICAL DATA:  Neuro deficit, acute, stroke suspected EXAM: MRI HEAD WITHOUT CONTRAST TECHNIQUE: Multiplanar, multiecho pulse sequences of the brain and surrounding structures were obtained without intravenous contrast. COMPARISON:  Same day CT and CTA. FINDINGS: Brain: Acute infarct in the left pons. Mild associated edema. Mild for age additional scattered T2 hyperintensities in the white matter, nonspecific but compatible with chronic microvascular ischemic disease. 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 No acute hemorrhage, hydrocephalus, extra-axial fluid collection, mass lesion or midline shift. Small focus of susceptibility artifact in the left frontal white matter without edema, compatible with remote microhemorrhage. Vascular: Further evaluated on same day CTA. Skull and upper cervical spine: Normal marrow signal. Sinuses/Orbits: Mild paranasal sinus mucosal thickening. Unremarkable orbits. Other: No mastoid effusions. IMPRESSION: 1. Acute infarct in the left pons.  Mild associated edema. 2. 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 3. Mild chronic microvascular ischemic disease. Electronically Signed   By: Margaretha Sheffield M.D.   On: 09/14/2021 19:04   CT CHEST ABDOMEN PELVIS W CONTRAST  Result Date: 09/14/2021 CLINICAL DATA:  Fall, right-sided weakness, right chest/abdominal pain EXAM: CT CHEST, ABDOMEN, AND PELVIS WITH CONTRAST TECHNIQUE:  Multidetector CT imaging of the chest, abdomen and pelvis was performed following the standard protocol during bolus administration of intravenous contrast. CONTRAST:  70mL OMNIPAQUE IOHEXOL 350 MG/ML SOLN COMPARISON:  CT abdomen/pelvis dated 04/16/2021 FINDINGS: CT CHEST FINDINGS Cardiovascular: Heart is normal in size.  No pericardial effusion No evidence thoracic aortic aneurysm. Atherosclerotic calcifications of the aortic arch. 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 Mediastinum/Nodes: No suspicious mediastinal lymphadenopathy. Visualized thyroid is unremarkable. Lungs/Pleura: Eventration of left hemidiaphragm. Associated left lower lobe and right basilar atelectasis. No focal consolidation. No suspicious pulmonary nodules. No pleural effusion or pneumothorax. Musculoskeletal: Mild to moderate degenerative changes of the mid/lower thoracic spine. Cervical spine fixation hardware, incompletely visualized. No fracture is seen. Specifically, the right ribs are intact. CT ABDOMEN PELVIS FINDINGS Hepatobiliary: Macronodular hepatic contour. No focal hepatic lesion is seen. Status post cholecystectomy. No intrahepatic or extrahepatic ductal dilatation. Pancreas: Mild parenchymal atrophy. Spleen: Within normal limits. Adrenals/Urinary Tract: Adrenal glands are within normal limits. Early excretory contrast in the bilateral renal collecting systems. 2.6 cm lateral left upper pole renal cyst (series 3/image 58). 15 mm medial right upper pole renal cyst (series 3/image 70). No hydronephrosis. Moderately distended bladder. Stomach/Bowel: Stomach is within normal limits. No evidence of bowel obstruction. Normal appendix (series 3/image 90). Left colonic diverticulosis, without evidence of diverticulitis. Vascular/Lymphatic: No  evidence of abdominal aortic aneurysm. Atherosclerotic calcifications of the abdominal aorta and branch vessels. No suspicious abdominopelvic lymphadenopathy.  Reproductive: Prostate is unremarkable. Other: No abdominopelvic ascites. Musculoskeletal: Moderate degenerative changes of the lumbar spine. Mild new superior endplate changes with loss of height at L4, chronic. No retropulsion. IMPRESSION: No evidence of traumatic injury to the chest, abdomen, or pelvis. Specifically, no right rib fracture is seen. No acute cardiopulmonary abnormality. Stable ancillary findings in the abdomen/pelvis. Electronically Signed   By: Julian Hy M.D.   On: 09/14/2021 20:32   DG Swallowing Func-Speech Pathology  Result Date: 09/16/2021 Table formatting from the original result was not included. 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  DIABETES MELLITUS, TYPE II 29/24/4628  Diastolic dysfunction   on echo  Diverticulosis   ERECTILE DYSFUNCTION 08/25/2007  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;  Service: Neurosurgery;  Laterality: Right;  right side anterior approach   CHOLECYSTECTOMY  1999  COLONOSCOPY    LUMBAR FUSION  2005  LUMBAR LAMINECTOMY/DECOMPRESSION MICRODISCECTOMY Left 07/05/2021  Procedure: Left Thoracic 12 to Lumbar 1 Transpedicular microdiscectomy;  Surgeon: Erline Levine, MD;  Location: [place];  Service: Neurosurgery;  Laterality: Left;  3C/RM 21  TONSILLECTOMY AND ADENOIDECTOMY    UPPER GASTROINTESTINAL ENDOSCOPY   HPI: FINNIAN HUSTED is a 80 y.o. male who presented via EMS for right facial droop, right sided weakness, and slurred speech. MRI 10/15: "Acute infarct in the left pons." Chest CT 10/15: "Eventration of left hemidiaphragm. Associated left  lower lobe and right basilar atelectasis."  Pt with a PMHx of GERD, DM II, HLD, OA, cancer, gout, Hep C, and HTN."  Subjective: Pt awake, alert, pleasant, participative Assessment / Plan / Recommendation CHL IP CLINICAL IMPRESSIONS 09/16/2021 Clinical Impression Pt demonstrates a primarily oral dysphagia with decreased lingual movement for bolus formation and propulsion with moderate buccal and floor of mouth residue on the right as well as anterior spillage. After oral care, removal of dentures and some practice, pt was able to significantly improve oral control of boluses. Thin liquids spill posteriorally resulting in inconsistent sensation of trace aspriation before the swallow. With nectar thick liquids, bolus cohesion is better and pt does not aspirate unless bolus is larger with spillage over the epiglottis, which only occurred with straw sips. Small cups sips and spoonfuls toelrated well. Puree tolerated well. Did not trial solids at this time but expect pt could attempt soft solids when lingual mucosa moist, dentures in place and pt demonstrates independent management of right oral residue. Pt recommended to initaite puree and nectar thick liquids (no straw) with in-room suction set  up. Recommend CIR at d/c. SLP Visit Diagnosis Dysphagia, unspecified (R13.10) Attention and concentration deficit following --  Frontal lobe and executive function deficit following -- Impact on safety and function Risk for inadequate nutrition/hydration;Moderate aspiration risk   CHL IP TREATMENT RECOMMENDATION 09/15/2021 Treatment Recommendations Defer until completion of intrumental exam   Prognosis 09/16/2021 Prognosis for Safe Diet Advancement Good Barriers to Reach Goals -- Barriers/Prognosis Comment -- CHL IP DIET RECOMMENDATION 09/16/2021 SLP Diet Recommendations Dysphagia 1 (Puree) solids;Nectar thick liquid Liquid Administration via Cup;Spoon Medication Administration Crushed with puree Compensations -- Postural Changes --   CHL IP OTHER RECOMMENDATIONS 09/16/2021 Recommended Consults -- Oral Care Recommendations Oral care before and after PO Other Recommendations Order thickener from pharmacy;Have oral suction available   CHL IP FOLLOW UP RECOMMENDATIONS 09/16/2021 Follow up Recommendations Inpatient Rehab   CHL IP FREQUENCY AND DURATION 09/16/2021 Speech Therapy Frequency (ACUTE ONLY) min 2x/week Treatment Duration 2 weeks      CHL IP ORAL PHASE 09/16/2021 Oral Phase Impaired Oral - Pudding Teaspoon -- Oral - Pudding Cup -- Oral - Honey Teaspoon -- Oral - Honey Cup Lingual/palatal residue;Decreased bolus cohesion;Delayed oral transit Oral - Nectar Teaspoon Lingual/palatal residue;Decreased bolus cohesion;Delayed oral transit Oral - Nectar Cup Lingual/palatal residue;Decreased bolus cohesion;Delayed oral transit Oral - Nectar Straw Lingual/palatal residue;Decreased bolus cohesion;Delayed oral transit Oral - Thin Teaspoon Lingual/palatal residue;Decreased bolus cohesion;Delayed oral transit Oral - Thin Cup -- Oral - Thin Straw -- Oral - Puree Lingual/palatal residue;Decreased bolus cohesion;Delayed oral transit Oral - Mech Soft -- Oral - Regular NT Oral - Multi-Consistency -- Oral - Pill -- Oral Phase - Comment --  CHL IP PHARYNGEAL PHASE 09/16/2021 Pharyngeal Phase Impaired Pharyngeal- Pudding Teaspoon -- Pharyngeal -- Pharyngeal-  Pudding Cup -- Pharyngeal -- Pharyngeal- Honey Teaspoon -- Pharyngeal -- Pharyngeal- Honey Cup Delayed swallow initiation-vallecula;Pharyngeal residue - valleculae Pharyngeal -- Pharyngeal- Nectar Teaspoon WFL Pharyngeal -- Pharyngeal- Nectar Cup WFL Pharyngeal -- Pharyngeal- Nectar Straw Delayed swallow initiation-vallecula;Delayed swallow initiation-pyriform sinuses;Penetration/Aspiration before swallow Pharyngeal Material enters airway, passes BELOW cords without attempt by patient to eject out (silent aspiration) Pharyngeal- Thin Teaspoon Delayed swallow initiation-pyriform sinuses;Penetration/Aspiration before swallow Pharyngeal Material enters airway, passes BELOW cords and not ejected out despite cough attempt by patient;Material enters airway, passes BELOW cords then ejected out Pharyngeal- Thin Cup -- Pharyngeal -- Pharyngeal- Thin Straw -- Pharyngeal -- Pharyngeal- Puree -- Pharyngeal -- Pharyngeal- Mechanical Soft -- Pharyngeal -- Pharyngeal- Regular -- Pharyngeal -- Pharyngeal- Multi-consistency -- Pharyngeal -- Pharyngeal- Pill -- Pharyngeal -- Pharyngeal Comment --  No flowsheet data found. DeBlois, Katherene Ponto 09/16/2021, 2:15 PM              EEG adult  Result Date: 09/14/2021 Greta Doom, MD     09/14/2021  3:56 PM History: 80 yo M with weakness and slurred speech Sedation: None Technique: This EEG was acquired with electrodes placed according to the International 10-20 electrode system (including Fp1, Fp2, F3, F4, C3, C4, P3, P4, O1, O2, T3, T4, T5, T6, A1, A2, Fz, Cz, Pz). The following electrodes were missing or displaced: none. Background: The background consists of intermixed alpha and beta activities. There is a well defined posterior dominant rhythm of 9 Hz that attenuates with eye opening. There is increasing delta with drowsiness. Sleep is recorded with normal appearing structures. Photic stimulation: Physiologic driving is not performed EEG Abnormalities: none Clinical  Interpretation: This normal EEG is recorded in the waking and sleep state. There was no seizure or seizure predisposition recorded on this study. Please  note that lack of epileptiform activity on EEG does not preclude the possibility of epilepsy. Roland Rack, MD [facility] [phone] If 7pm- 7am, please page neurology on call as listed in [place].   CT HEAD CODE STROKE WO CONTRAST  Result Date: 09/14/2021 CLINICAL DATA:  Code stroke.  Neuro deficit, acute, stroke suspected EXAM: CT HEAD WITHOUT CONTRAST TECHNIQUE: Contiguous axial images were obtained from the base of the skull through the vertex without intravenous contrast. COMPARISON:  12/29/2008. FINDINGS: Brain: No evidence of acute large vascular territory infarction, hemorrhage, hydrocephalus, extra-axial collection or mass lesion/mass effect. Remote right cerebellar infarct. Vascular: No hyperdense vessel identified. Skull: No acute fracture. Sinuses/Orbits: Mild paranasal sinus mucosal thickening. No acute orbital findings. Other: No mastoid effusions. ASPECTS [facility] Stroke Program Early CT Score) total score (0-10 with 10 being normal): 10. IMPRESSION: 1. No evidence of acute large vascular territory infarct or acute hemorrhage. ASPECTS is 10. 2. Remote right cerebellar infarct. Code stroke imaging results were communicated on 09/14/2021 at 1:32 pm to provider Dr. Theda Sers via secure text paging. CT head Electronically Signed   By: Margaretha Sheffield M.D.   On: 09/14/2021 13:33   CT ANGIO HEAD CODE STROKE  Result Date: 09/14/2021 CLINICAL DATA:  Neuro deficit, acute, stroke suspected; Stroke/TIA, assess extracranial arteries EXAM: CT ANGIOGRAPHY HEAD AND NECK TECHNIQUE: Multidetector CT imaging of the head and neck was performed using the standard protocol during bolus administration of intravenous contrast. Multiplanar CT image reconstructions and MIPs were obtained to evaluate the vascular anatomy. Carotid stenosis  measurements (when applicable) are obtained utilizing NASCET criteria, using the distal internal carotid diameter as the denominator. CONTRAST:  165mL OMNIPAQUE IOHEXOL 350 MG/ML SOLN COMPARISON:  None FINDINGS: CTA NECK Aortic arch: Mild calcified plaque along the arch and patent great vessel origins. There is aberrant origin of the right subclavian artery. No high-grade proximal subclavian stenosis. Right carotid system: Patent. Mixed plaque along the proximal internal carotid with nearly 50% stenosis. Left carotid system: Patent. Noncalcified plaque along the proximal internal carotid with less than 50% stenosis. Vertebral arteries: Left vertebral artery is patent. There is no significant enhancement of the right vertebral artery from the origin to the V3 segment where there is partial reconstitution Skeleton: Postoperative changes of anterior fusion at C3-C5 and C6-C7. Cervical spine degenerative changes. Other neck: Unremarkable. Upper chest: No apical lung mass. Review of the MIP images confirms the above findings CTA HEAD Anterior circulation: Intracranial internal carotid arteries are patent. There is calcified plaque along cavernous and supraclinoid portions mild to moderate stenosis. Anterior and middle cerebral arteries are patent. Posterior circulation: Intracranial right vertebral artery is patent but irregular with mixed plaque and areas of moderate to marked stenosis. The right PICA origin is patent. Intracranial left vertebral artery is patent. Is basilar artery is patent. Other major cerebellar artery origins are patent. Posterior cerebral arteries are patent. There is a right posterior communicating artery present. Venous sinuses: Patent as allowed by contrast bolus timing. 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 The intracranial portion is patent but  regular with stenoses. Plaque at the proximal right ICA with nearly 50% stenosis. Plaque at the proximal left ICA with less than 50% stenosis. Initial results were provided by telephone at the time of interpretation on 09/14/2021 at 2:00 pm to provider [facility] , who verbally acknowledged these results. Electronically Signed   By: Macy Mis  M.D.   On: 09/14/2021 14:22   CT ANGIO NECK CODE STROKE  Result Date: 09/14/2021 CLINICAL DATA:  Neuro deficit, acute, stroke suspected; Stroke/TIA, assess extracranial arteries EXAM: CT ANGIOGRAPHY HEAD AND NECK TECHNIQUE: Multidetector CT imaging of the head and neck was performed using the standard protocol during bolus administration of intravenous contrast. Multiplanar CT image reconstructions and MIPs were obtained to evaluate the vascular anatomy. Carotid stenosis measurements (when applicable) are obtained utilizing NASCET criteria, using the distal internal carotid diameter as the denominator. CONTRAST:  169mL OMNIPAQUE IOHEXOL 350 MG/ML SOLN COMPARISON:  None FINDINGS: CTA NECK Aortic arch: Mild calcified plaque along the arch and patent great vessel origins. There is aberrant origin of the right subclavian artery. No high-grade proximal subclavian stenosis. Right carotid system: Patent. Mixed plaque along the proximal internal carotid with nearly 50% stenosis. Left carotid system: Patent. Noncalcified plaque along the proximal internal carotid with less than 50% stenosis. Vertebral arteries: Left vertebral artery is patent. There is no significant enhancement of the right vertebral artery from the origin to the V3 segment where there is partial reconstitution Skeleton: Postoperative changes of anterior fusion at C3-C5 and C6-C7. Cervical spine degenerative changes. Other neck: Unremarkable. Upper chest: No apical lung mass. Review of the MIP images confirms the above findings CTA HEAD Anterior circulation: Intracranial internal carotid arteries are  patent. There is calcified plaque along cavernous and supraclinoid portions mild to moderate stenosis. Anterior and middle cerebral arteries are patent. Posterior circulation: Intracranial right vertebral artery is patent but irregular with mixed plaque and areas of moderate to marked stenosis. The right PICA origin is patent. Intracranial left vertebral artery is patent. Is basilar artery is patent. Other major cerebellar artery origins are patent. Posterior cerebral arteries are patent. There is a right posterior communicating artery present. Venous sinuses: Patent as allowed by contrast bolus timing. 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 The intracranial portion is patent but regular with stenoses. Plaque at the proximal right ICA with nearly 50% stenosis. Plaque at the proximal left ICA with less than 50% stenosis. Initial results were provided by telephone at the time of interpretation on 09/14/2021 at 2:00 pm to provider [facility] , who verbally acknowledged these results. Electronically Signed   By: Macy Mis M.D.   On: 09/14/2021 14:22     PHYSICAL EXAM  Temp:  [97.7 F (36.5 C)-98.4 F (36.9 C)] 98 F (36.7 C) (10/17 1131) Pulse Rate:  [83-94] 85 (10/17 1131) Resp:  [20-25] 23 (10/17 0345) BP: (145-179)/(88-154) 167/97 (10/17 1131) SpO2:  [92 %-97 %] 96 % (10/17 1131)  General - Well nourished, well developed, in no apparent distress.  Ophthalmologic - fundi not visualized due to noncooperation.  Cardiovascular - Regular rhythm and rate.  Neuro - awake, alert, eyes open, orientated to age, place, time and people. No aphasia, moderate to severe dysarthria, following all simple commands. Able to name and repeat with dysarthric voice. No gaze palsy but with left gaze he has left directed nystagmus, tracking bilaterally, visual field  full, PERRL. Right facial droop. Tongue protrusion to the right. LUE 4+/5 and LLE 3/5 proximal and 5/5 distal. Sensation symmetrical bilaterally, left FTN intact grossly, gait not tested.   ASSESSMENT/PLAN Larry Robertson is a 80 y.o. male with history of diabetes, hypertension, hyperlipidemia, hep C admitted for right facial droop, right-sided weakness and slurred speech.  TNK given.  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, right VA occlusion MRI left pontine infarct EEG no seizure Repeat CT no hemorrhage 2D Echo pending  LDL 109 HgbA1c 7.4 Lovenox for VTE prophylaxis No antithrombotic prior to admission, now ASA 81mg  and Plavix 75 mg daily x 3 weeks then ASA alone  Patient counseled to be compliant with his antithrombotic medications Ongoing aggressive stroke risk factor management Therapy recommendations: Pending Disposition: Pending  Diabetes HgbA1c 7.4 goal < 7.0 Uncontrolled CBG monitoring SSI DM education and close PCP follow up  Hypertension Stable on the high end Labetalol IV as needed Resume home BP meds. On amlodipine 10, avapro 300 and clonidine 0.1 bid Long term BP goal normotensive  Hyperlipidemia Home meds: Lipitor 20 LDL 109, goal < 70 Now on Lipitor 40 Continue statin at discharge        Dysphagia Now on dys1 nectar thick liquid Speech on board Decrease IVF from 75->40 cc/h Encourage po intake  Gout, right knee Home mdes - allopurinol, colchicine, prednisone  Currently right knee swallow  X-ray showed right knee moderate joint effusion Resume allopurinol 100 daily Colchicine 1.2 x 1 followed by 0.6 tomorrow Prednisone 30mg  x 1 Reassess in am  Other Stroke Risk Factors Advanced age  Other Active Problems [facility] day # 2  This plan of care directed the plan of care.  Charlene Brooke, NP-C   ATTENDING NOTE: I reviewed  above note and agree with the assessment and plan. Pt was seen and examined.   No family at the bedside, pt lying in bed, still has right facial droop and right hemiplegia. Intermittent coughing with mild gurgling sound. Passed swallow on dys 1 and nectar thick liquid. BP on the high end, now on amlodipine and avapro, will resume home clonidine. Still has right knee pain with tenderness on touch, likely gout flare, pt takes allopurinol, colchicine and prednisone at home, will restart allopurinol, clochicine x 2 dose with prednisone 30mg  x1. Will reassess in am to consider 2nd dose of prednisone. PT/OT recommend CIR, however, rehab / SW will have to pursue SNF admit.   For detailed assessment and plan, please refer to above as I have made changes wherever appropriate.   Rosalin Hawking, MD PhD Stroke Neurology 09/16/2021 10:34 PM    To contact Stroke Continuity provider, please refer to [email or web address] After hours, contact General Neurology

## 2021-09-16 NOTE — Progress Notes (Signed)
Inpatient Rehabilitation Admissions Coordinator   Inpatient rehab consult received. I met with patein at bedside and then contacted his daughter, Milon Score , by phone. Patient lived alone and was independent prior to amit. Verdis Frederickson works part time and can not provide caregiver supports that he will need after a CIR admit. She is aware that I am therefore recommending SNF rehab at this time. She I in agreement. I will alert acute team and TOC. We will not pursue CIR admit.   Danne Baxter, RN, MSN Rehab Admissions Coordinator [phone] 09/16/2021 2:11 PM

## 2021-09-16 NOTE — TOC CAGE-AID Note (Addendum)
Transition of Care [facility]) - CAGE-AID Screening   Patient Details  Name: Larry Robertson MRN: [id number] Date of Birth: Oct 28, 1941  Transition [facility]) CM/SW Contact:    Gerald Kuehl C Tarpley-Carter, [place] Phone Number: 09/16/2021, 1:58 PM   Clinical Narrative: Pt participated in [place].  Pt stated he does not use substance or ETOH. Pt has a past history of ETOH.  Pt was not offered resources, due to no usage of substance or ETOH.   Oluwafemi Villella Tarpley-Carter, MSW, LCSW-A Pronouns:  She/Her/Hers [facility] HealthTransitions of Care Clinical Social Worker Direct Number:  [phone] [email or web address]    CAGE-AID Screening:    Have You Ever Felt You Ought to Cut Down on Your Drinking or Drug Use?: No Have People Annoyed You By [employer] Your Drinking Or Drug Use?: No Have You Felt Bad Or Guilty About Your Drinking Or Drug Use?: No Have You Ever Had a Drink or Used Drugs First Thing In The Morning to Steady Your Nerves or to Get Rid of a Hangover?: No CAGE-AID Score: 0  Substance Abuse Education Offered: No

## 2021-09-16 NOTE — Evaluation (Signed)
Physical Therapy Evaluation Patient Details Name: Larry Robertson MRN: [id number] DOB: January 04, 1941 Today's Date: 09/16/2021  History of Present Illness  80yo male who presented on 10/15 after a witnessed fall at home, family recognized stroke symptoms and called 911. 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 Received TNK 10/15. PMH skin CA, DM, diastolic dysfunction, hepatitis C, HTN  Clinical Impression   Patient received in bed, pleasant and cooperative; difficult to understand due to slurred speech, but followed cues well during mobility. Needed MaxAx2 for all aspects of functional mobility today, and has heavy posterior and right lean in unsupported sitting. Also with poor proprioception- tends to overshoot quickly when cued to correct sitting balance. Able to get into recliner with MaxAx2 and stedy today, left positioned to comfort with all needs met and chair alarm active. Would really benefit from intensive therapies in CIR setting moving forward.        Recommendations for follow up therapy are one component of a multi-disciplinary discharge planning process, led by the attending physician.  Recommendations may be updated based on patient status, additional functional criteria and insurance authorization.  Follow Up Recommendations CIR;Supervision/Assistance - 24 hour    Equipment Recommendations  Other (comment) (TBD as he progresses)    Recommendations for Other Services       Precautions / Restrictions Precautions Precautions: Fall;Other (comment) Precaution Comments: R hemi, poor balance Restrictions Weight Bearing Restrictions: No      Mobility  Bed Mobility Overal bed mobility: Needs Assistance Bed Mobility: Supine to Sit     Supine to sit: Max assist;+2 for physical assistance;HOB elevated     General bed mobility comments: able to move L LE along bed a bit, needed help managing R LE as well as trunk even with cues for sequencing; heavy posterior lean once up and tends to  overcorrect anteriorly    Transfers Overall transfer level: Needs assistance Equipment used: Ambulation equipment used Transfers: Sit to/from [facility] Sit to Stand: Max assist;+2 physical assistance;From elevated surface Stand pivot transfers: Total assist;+2 physical assistance       General transfer comment: MaxAx2 to boost up from elevated bed and elevated stedy seat; needed heavy Mod facilitation to shift weight onto L LE and keep it there during functional transfers/transitions. totalAx2 to pivot in stedy  Ambulation/Gait             General Gait Details: unable  Stairs            Wheelchair Mobility    Modified Rankin (Stroke Patients Only)       Balance Overall balance assessment: Needs assistance Sitting-balance support: Feet supported;Single extremity supported Sitting balance-Leahy Scale: Poor Sitting balance - Comments: Mod cues and facilitation for upright/midline sitting Postural control: Posterior lean;Right lateral lean Standing balance support: Single extremity supported;During functional activity Standing balance-Leahy Scale: Poor Standing balance comment: maxAx2 dynamically                             Pertinent Vitals/Pain Pain Assessment: Faces Faces Pain Scale: Hurts even more Pain Location: R UE Pain Descriptors / Indicators: Aching;Discomfort Pain Intervention(s): Limited activity within patient's tolerance;Monitored during session    Home Living Family/patient expects to be discharged to:: Private residence Living Arrangements: Children Available Help at Discharge: Family;Available 24 hours/day (daughter) Type of Home: House Home Access: Level entry     Home Layout: One level Home Equipment: Grab bars - tub/shower  Prior Function Level of Independence: Independent         Comments: some information taken from prior charting; information he said in session was accurate     Hand  Dominance        Extremity/Trunk Assessment   Upper Extremity Assessment Upper Extremity Assessment: Defer to OT evaluation (R hemi)    Lower Extremity Assessment Lower Extremity Assessment: RLE deficits/detail;LLE deficits/detail RLE Deficits / Details: approximately 2/5 grossly based on functional task performance RLE Sensation: decreased proprioception RLE Coordination: decreased gross motor LLE Deficits / Details: approximately 3+/5 to 4-/5 based on functional task performance    Cervical / Trunk Assessment Cervical / Trunk Assessment: Normal  Communication   Communication: No difficulties  Cognition Arousal/Alertness: Awake/alert Behavior During Therapy: Flat affect Overall Cognitive Status: Within Functional Limits for tasks assessed                                 General Comments: a bit difficult to understand due to slurred speech- but able to recall past events accurately from prior hospital stays and followed cues well.      General Comments General comments (skin integrity, edema, etc.): BP elevated but stable during session, RN aware    Exercises     Assessment/Plan    PT Assessment Patient needs continued PT services  PT Problem List Decreased strength;Decreased activity tolerance;Decreased safety awareness;Decreased balance;Decreased mobility;Decreased coordination;Pain;Impaired sensation       PT Treatment Interventions DME instruction;Balance training;Gait training;Neuromuscular re-education;Functional mobility training;Patient/family education;Therapeutic activities;Wheelchair mobility training;Therapeutic exercise;Manual techniques    PT Goals (Current goals can be found in the Care Plan section)  Acute Rehab PT Goals Patient Stated Goal: get better/rehab PT Goal Formulation: With patient Time For Goal Achievement: 09/30/21 Potential to Achieve Goals: Good    Frequency Min 4X/week   Barriers to discharge        Co-evaluation                AM-PAC PT "6 Clicks" Mobility  Outcome Measure Help needed turning from your back to your side while in a flat bed without using bedrails?: A Lot Help needed moving from lying on your back to sitting on the side of a flat bed without using bedrails?: Total Help needed moving to and from a bed to a chair (including a wheelchair)?: Total Help needed standing up from a chair using your arms (e.g., wheelchair or bedside chair)?: Total Help needed to walk in hospital room?: Total Help needed climbing 3-5 steps with a railing? : Total 6 Click Score: 7    End of Session Equipment Utilized During Treatment: Gait belt Activity Tolerance: Patient tolerated treatment well Patient left: in chair;with call bell/phone within reach;with chair alarm set Nurse Communication: Mobility status;Need for lift equipment PT Visit Diagnosis: Unsteadiness on feet (R26.81);Hemiplegia and hemiparesis;Other abnormalities of gait and mobility (R26.89);Difficulty in walking, not elsewhere classified (R26.2) Hemiplegia - Right/Left: Right Hemiplegia - dominant/non-dominant: Dominant Hemiplegia - caused by: Cerebral infarction    Time: 2549-8264 PT Time Calculation (min) (ACUTE ONLY): 32 min   Charges:   PT Evaluation $PT Eval Moderate Complexity: 1 Mod (co-tx with OT)         Ann Lions PT, DPT, PN2   Supplemental Physical Therapist [place]    Pager [phone] Acute Rehab Office [phone]

## 2021-09-16 NOTE — Progress Notes (Signed)
Inpatient Rehab Admissions Coordinator:   Per therapy recommendations,  patient was screened for CIR candidacy by Clemens Catholic, MS, CCC-SLP . At this time, Pt. Appears to demonstrate medical necessity, functional decline, and ability to tolerate intensity of CIR. Pt. is a potential candidate for CIR. I will place   order for rehab consult per protocol for full assessment. Please contact me any with questions.Clemens Catholic, [place], [place] Admissions Coordinator  [phone] ([place]) [phone] (office)

## 2021-09-17 ENCOUNTER — Inpatient Hospital Stay (HOSPITAL_COMMUNITY): Payer: Medicare Other

## 2021-09-17 DIAGNOSIS — I6389 Other cerebral infarction: Secondary | ICD-10-CM | POA: Diagnosis not present

## 2021-09-17 DIAGNOSIS — E781 Pure hyperglyceridemia: Secondary | ICD-10-CM

## 2021-09-17 DIAGNOSIS — I6381 Other cerebral infarction due to occlusion or stenosis of small artery: Secondary | ICD-10-CM

## 2021-09-17 DIAGNOSIS — M109 Gout, unspecified: Secondary | ICD-10-CM

## 2021-09-17 DIAGNOSIS — E119 Type 2 diabetes mellitus without complications: Secondary | ICD-10-CM

## 2021-09-17 LAB — CBC
HCT: 39.3 % (ref 39.0–52.0)
Hemoglobin: 12.8 g/dL — ABNORMAL LOW (ref 13.0–17.0)
MCH: 29.9 pg (ref 26.0–34.0)
MCHC: 32.6 g/dL (ref 30.0–36.0)
MCV: 91.8 fL (ref 80.0–100.0)
Platelets: 211 10*3/uL (ref 150–400)
RBC: 4.28 MIL/uL (ref 4.22–5.81)
RDW: 13.1 % (ref 11.5–15.5)
WBC: 8.9 10*3/uL (ref 4.0–10.5)
nRBC: 0 % (ref 0.0–0.2)

## 2021-09-17 LAB — BASIC METABOLIC PANEL
Anion gap: 8 (ref 5–15)
BUN: 19 mg/dL (ref 8–23)
CO2: 23 mmol/L (ref 22–32)
Calcium: 8.9 mg/dL (ref 8.9–10.3)
Chloride: 109 mmol/L (ref 98–111)
Creatinine, Ser: 0.79 mg/dL (ref 0.61–1.24)
GFR, Estimated: >60 mL/min (ref >60)
Glucose, Bld: 223 mg/dL — ABNORMAL HIGH (ref 70–99)
Potassium: 3.4 mmol/L — ABNORMAL LOW (ref 3.5–5.1)
Sodium: 140 mmol/L (ref 135–145)

## 2021-09-17 LAB — GLUCOSE, CAPILLARY
Glucose-Capillary: 218 mg/dL — ABNORMAL HIGH (ref 70–99)
Glucose-Capillary: 150 mg/dL — ABNORMAL HIGH (ref 70–99)
Glucose-Capillary: 222 mg/dL — ABNORMAL HIGH (ref 70–99)
Glucose-Capillary: 231 mg/dL — ABNORMAL HIGH (ref 70–99)
Glucose-Capillary: 225 mg/dL — ABNORMAL HIGH (ref 70–99)

## 2021-09-17 LAB — ECHOCARDIOGRAM COMPLETE
Area-P 1/2: 2.51 cm2
Weight: 3336.88 oz

## 2021-09-17 LAB — RESP PANEL BY RT-PCR (FLU A&B, COVID) ARPGX2
Influenza A by PCR: NEGATIVE
Influenza B by PCR: NEGATIVE
SARS Coronavirus 2 by RT PCR: NEGATIVE

## 2021-09-17 MED ORDER — FISH OIL 1000 MG PO CAPS: 2.0000 | ORAL_CAPSULE | Freq: Every day | ORAL | 0 refills | Status: AC

## 2021-09-17 MED ORDER — B COMPLEX VITAMINS PO CAPS: 1.0000 | ORAL_CAPSULE | Freq: Every day | ORAL | 0 refills | Status: AC

## 2021-09-17 MED ORDER — ATORVASTATIN CALCIUM 40 MG PO TABS: 40.0000 mg | ORAL_TABLET | Freq: Every day | ORAL | 0 refills | Status: AC

## 2021-09-17 MED ORDER — PANTOPRAZOLE SODIUM 40 MG PO TBEC: 40.0000 mg | DELAYED_RELEASE_TABLET | Freq: Every day | ORAL | Status: DC

## 2021-09-17 MED ORDER — MULTI-VITAMIN/MINERALS PO TABS: 1.0000 | ORAL_TABLET | Freq: Every day | ORAL | 0 refills | Status: AC

## 2021-09-17 MED ORDER — AMLODIPINE-OLMESARTAN 10-40 MG PO TABS
1.0000 | ORAL_TABLET | Freq: Every day | ORAL | 0 refills | Status: AC
Start: 2021-09-17 — End: ?

## 2021-09-17 MED ORDER — REPAGLINIDE 2 MG PO TABS: 2.0000 mg | ORAL_TABLET | Freq: Two times a day (BID) | ORAL | 0 refills | Status: AC

## 2021-09-17 MED ORDER — ASPIRIN 81 MG PO TBEC: 81.0000 mg | DELAYED_RELEASE_TABLET | Freq: Every day | ORAL | 0 refills | Status: AC

## 2021-09-17 MED ORDER — ALLOPURINOL 100 MG PO TABS: ORAL_TABLET | ORAL | 0 refills | Status: AC

## 2021-09-17 MED ORDER — IRBESARTAN 300 MG PO TABS: 300.0000 mg | ORAL_TABLET | Freq: Every day | ORAL | 0 refills | Status: AC

## 2021-09-17 MED ORDER — CLOPIDOGREL BISULFATE 75 MG PO TABS: 75.0000 mg | ORAL_TABLET | Freq: Every day | ORAL | 0 refills | Status: AC

## 2021-09-17 MED ORDER — OXYCODONE-ACETAMINOPHEN 10-325 MG PO TABS: 1.0000 | ORAL_TABLET | Freq: Three times a day (TID) | ORAL | 0 refills | Status: DC | PRN

## 2021-09-17 NOTE — Progress Notes (Signed)
PHARMACIST - PHYSICIAN COMMUNICATION  DR:   Erlinda Hong  CONCERNING: IV to Oral Route Change Policy  RECOMMENDATION: This patient is receiving Protonix by the intravenous route.  Based on criteria approved by the Pharmacy and Therapeutics Committee, the intravenous medication(s) is/are being converted to the equivalent oral dose form(s).   DESCRIPTION: These criteria include: The patient is eating (either orally or via tube) and/or has been taking other orally administered medications for a least 24 hours The patient has no evidence of active gastrointestinal bleeding or impaired GI absorption (gastrectomy, short bowel, patient on TNA or NPO).  If you have questions about this conversion, please contact the Pharmacy Department  []   [phone] )  [facility] []   [phone] )  [facility] [x]   [phone] )  [facility] []   [phone] )  [facility] []   [phone] )  [place], PharmD, [place], AAHIVP, CPP Infectious Disease Pharmacist 09/17/2021 8:45 AM

## 2021-09-17 NOTE — Discharge Summary (Addendum)
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 Active Problems:   Diabetes ([place])   HYPERTRIGLYCERIDEMIA   Acute gout, right knee   Essential hypertension   HepC    Allergies as of 09/17/2021       Reactions   Lyrica [pregabalin] Other (See Comments)   Blurred vision   Gabapentin Rash, Other (See Comments)   Diplopia (double vision)        Medication List     STOP taking these medications    cloNIDine 0.1 MG tablet Commonly known as: CATAPRES   cyclobenzaprine 10 MG tablet Commonly known as: FLEXERIL   methocarbamol 500 MG tablet Commonly known as: ROBAXIN   methylPREDNISolone 4 MG Tbpk tablet Commonly known as: MEDROL DOSEPAK   oxyCODONE-acetaminophen 10-325 MG tablet Commonly known as: PERCOCET   predniSONE 10 MG tablet Commonly known as: DELTASONE       TAKE these medications    Accu-Chek Aviva Plus test strip Generic drug: glucose blood USE AS INSTRUCTED ONCE DAILY   allopurinol 100 MG tablet Commonly known as: ZYLOPRIM TAKE 1 TABLET BY MOUTH EVERY DAY What changed:  how much to take how to take this when to take this additional instructions   amLODipine-olmesartan 10-40 MG tablet Commonly known as: AZOR Take 1 tablet by mouth daily.   aspirin 81 MG EC tablet Take 1 tablet (81 mg total) by mouth daily. Swallow whole.   atorvastatin 40 MG tablet Commonly known as: LIPITOR Take 1 tablet (40 mg total) by mouth daily. What changed:  medication strength how much to take   b complex vitamins capsule Take 1 capsule by mouth daily.   clopidogrel 75 MG tablet Commonly known as: PLAVIX Take 1 tablet (75 mg total) by mouth daily.   colchicine 0.6  MG tablet 1 tablet every hour as needed for gout, not to exceed 6/day What changed:  how much to take how to take this when to take this reasons to take this additional instructions   Fish Oil 1000 MG Caps Take 2 capsules (2,000 mg total) by mouth daily.   irbesartan 300 MG tablet Commonly known as: AVAPRO Take 1 tablet (300 mg total) by mouth daily.   Januvia 100 MG tablet Generic drug: sitaGLIPtin TAKE 1 TABLET BY MOUTH EVERY DAY What changed: how much to take   metFORMIN 1000 MG tablet Commonly known as: GLUCOPHAGE TAKE 1 TABLET BY MOUTH 2 TIMES DAILY WITH A MEAL.   multivitamin with minerals tablet Take 1 tablet by mouth daily.   repaglinide 2 MG tablet Commonly known as: PRANDIN Take 1 tablet (2 mg total) by mouth 2 (two) times daily before a meal.        LABORATORY STUDIES CBC    Component Value Date/Time   WBC 8.9 09/17/2021 0412   RBC 4.28 09/17/2021 0412   HGB 12.8 (L) 09/17/2021 0412   HCT 39.3 09/17/2021 0412   PLT 211 09/17/2021 0412   MCV 91.8 09/17/2021 0412   MCH 29.9 09/17/2021 0412   MCHC 32.6 09/17/2021 0412   RDW 13.1 09/17/2021 0412   LYMPHSABS 1.2 09/14/2021 1318   MONOABS 1.1 (H) 09/14/2021 1318   EOSABS 0.0 09/14/2021 1318  BASOSABS 0.1 09/14/2021 1318   CMP    Component Value Date/Time   NA 140 09/17/2021 0412   K 3.4 (L) 09/17/2021 0412   CL 109 09/17/2021 0412   CO2 23 09/17/2021 0412   GLUCOSE 223 (H) 09/17/2021 0412   BUN 19 09/17/2021 0412   CREATININE 0.79 09/17/2021 0412   CALCIUM 8.9 09/17/2021 0412   PROT 6.8 09/14/2021 1318   ALBUMIN 4.2 09/14/2021 1318   AST 60 (H) 09/14/2021 1318   ALT 36 09/14/2021 1318   ALKPHOS 47 09/14/2021 1318   BILITOT 1.5 (H) 09/14/2021 1318   GFRNONAA >60 09/17/2021 0412   GFRAA >60 02/24/2018 1027   COAGS Lab Results  Component Value Date   INR 1.1 09/14/2021   INR 1.0 07/13/2013   Lipid Panel    Component Value Date/Time   CHOL 193 09/16/2021 0342   CHOL 128 01/21/2018  1306   TRIG 305 (H) 09/16/2021 0342   HDL 44 09/16/2021 0342   HDL 42 01/21/2018 1306   CHOLHDL 4.4 09/16/2021 0342   VLDL 61 (H) 09/16/2021 0342   LDLCALC 88 09/16/2021 0342   LDLCALC 39 01/21/2018 1306   HgbA1C  Lab Results  Component Value Date   HGBA1C 6.9 (H) 09/15/2021   Urinalysis    Component Value Date/Time   COLORURINE YELLOW 08/12/2021 1154   APPEARANCEUR CLEAR 08/12/2021 1154   LABSPEC 1.010 08/12/2021 1154   PHURINE 6.0 08/12/2021 1154   GLUCOSEU NEGATIVE 08/12/2021 1154   HGBUR NEGATIVE 08/12/2021 1154   HGBUR negative 07/17/2009 1432   BILIRUBINUR NEGATIVE 08/12/2021 1154   KETONESUR NEGATIVE 08/12/2021 1154   PROTEINUR NEGATIVE 04/16/2021 0925   UROBILINOGEN 0.2 08/12/2021 1154   NITRITE NEGATIVE 08/12/2021 1154   LEUKOCYTESUR NEGATIVE 08/12/2021 1154   Urine Drug Screen No results found for: LABOPIA, COCAINSCRNUR, LABBENZ, AMPHETMU, THCU, LABBARB  Alcohol Level No results found for: [facility]   SIGNIFICANT DIAGNOSTIC STUDIES DG Shoulder Right  Result Date: 09/15/2021 CLINICAL DATA:  Found down, pain EXAM: RIGHT SHOULDER - 2+ VIEW COMPARISON:  None. FINDINGS: There is no evidence of fracture or dislocation. Mild acromioclavicular and glenohumeral arthrosis. Soft tissues are unremarkable. IMPRESSION: No fracture or dislocation of the right shoulder. Mild acromioclavicular and glenohumeral arthrosis. Electronically Signed   By: Delanna Ahmadi M.D.   On: 09/15/2021 14:56   DG Knee 1-2 Views Right  Result Date: 09/15/2021 CLINICAL DATA:  Soft tissue swelling right knee.  No known injury. EXAM: RIGHT KNEE - 1-2 VIEW COMPARISON:  None. FINDINGS: Moderate joint effusion. Degenerative changes with spurring throughout the right knee, most pronounced in the lateral and patellofemoral compartments. No acute bony abnormality. Specifically, no fracture, subluxation, or dislocation. IMPRESSION: Moderate degenerative changes. Moderate joint effusion. No acute bony abnormality.  Electronically Signed   By: Rolm Baptise M.D.   On: 09/15/2021 19:07   CT HEAD WO CONTRAST (5MM)  Result Date: 09/15/2021 CLINICAL DATA:  Stroke, follow up EXAM: CT HEAD WITHOUT CONTRAST TECHNIQUE: Contiguous axial images were obtained from the base of the skull through the vertex without intravenous contrast. COMPARISON:  September 14, 2021. FINDINGS: Brain: New edema in the left hemi pons, compatible with known acute infarct seen on MRI. No progressive mass effect or evidence of acute hemorrhage. 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 Mild scattered additional white matter hypodensities, nonspecific but compatible with chronic microvascular disease. No hydrocephalus, extra-axial fluid collection, mass lesion, or midline shift. Vascular: No hyperdense vessel identified. Skull: No acute fracture. Sinuses/Orbits: Visualized sinuses are clear.  Unremarkable orbits. Other: No mastoid effusions. IMPRESSION: New edema in the left hemi pons, compatible with known acute infarct seen on MRI. No progressive mass effect or evidence of acute hemorrhage. Electronically Signed   By: Margaretha Sheffield M.D.   On: 09/15/2021 12:24   MR BRAIN WO CONTRAST  Result Date: 09/14/2021 CLINICAL DATA:  Neuro deficit, acute, stroke suspected EXAM: MRI HEAD WITHOUT CONTRAST TECHNIQUE: Multiplanar, multiecho pulse sequences of the brain and surrounding structures were obtained without intravenous contrast. COMPARISON:  Same day CT and CTA. FINDINGS: Brain: Acute infarct in the left pons. Mild associated edema. Mild for age additional scattered T2 hyperintensities in the white matter, nonspecific but compatible with chronic microvascular ischemic disease. 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 No acute hemorrhage, hydrocephalus, extra-axial fluid collection, mass lesion or midline shift. Small focus of susceptibility artifact in the left frontal white matter without edema, compatible with remote microhemorrhage. Vascular: Further  evaluated on same day CTA. Skull and upper cervical spine: Normal marrow signal. Sinuses/Orbits: Mild paranasal sinus mucosal thickening. Unremarkable orbits. Other: No mastoid effusions. IMPRESSION: 1. Acute infarct in the left pons.  Mild associated edema. 2. 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 3. Mild chronic microvascular ischemic disease. Electronically Signed   By: Margaretha Sheffield M.D.   On: 09/14/2021 19:04   CT CHEST ABDOMEN PELVIS W CONTRAST  Result Date: 09/14/2021 CLINICAL DATA:  Fall, right-sided weakness, right chest/abdominal pain EXAM: CT CHEST, ABDOMEN, AND PELVIS WITH CONTRAST TECHNIQUE: Multidetector CT imaging of the chest, abdomen and pelvis was performed following the standard protocol during bolus administration of intravenous contrast. CONTRAST:  65mL OMNIPAQUE IOHEXOL 350 MG/ML SOLN COMPARISON:  CT abdomen/pelvis dated 04/16/2021 FINDINGS: CT CHEST FINDINGS Cardiovascular: Heart is normal in size.  No pericardial effusion No evidence thoracic aortic aneurysm. Atherosclerotic calcifications of the aortic arch. 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 Mediastinum/Nodes: No suspicious mediastinal lymphadenopathy. Visualized thyroid is unremarkable. Lungs/Pleura: Eventration of left hemidiaphragm. Associated left lower lobe and right basilar atelectasis. No focal consolidation. No suspicious pulmonary nodules. No pleural effusion or pneumothorax. Musculoskeletal: Mild to moderate degenerative changes of the mid/lower thoracic spine. Cervical spine fixation hardware, incompletely visualized. No fracture is seen. Specifically, the right ribs are intact. CT ABDOMEN PELVIS FINDINGS Hepatobiliary: Macronodular hepatic contour. No focal hepatic lesion is seen. Status post cholecystectomy. No intrahepatic or extrahepatic ductal dilatation. Pancreas: Mild parenchymal atrophy. Spleen: Within normal limits. Adrenals/Urinary Tract: Adrenal glands are within normal  limits. Early excretory contrast in the bilateral renal collecting systems. 2.6 cm lateral left upper pole renal cyst (series 3/image 58). 15 mm medial right upper pole renal cyst (series 3/image 70). No hydronephrosis. Moderately distended bladder. Stomach/Bowel: Stomach is within normal limits. No evidence of bowel obstruction. Normal appendix (series 3/image 90). Left colonic diverticulosis, without evidence of diverticulitis. Vascular/Lymphatic: No evidence of abdominal aortic aneurysm. Atherosclerotic calcifications of the abdominal aorta and branch vessels. No suspicious abdominopelvic lymphadenopathy. Reproductive: Prostate is unremarkable. Other: No abdominopelvic ascites. Musculoskeletal: Moderate degenerative changes of the lumbar spine. Mild new superior endplate changes with loss of height at L4, chronic. No retropulsion. IMPRESSION: No evidence of traumatic injury to the chest, abdomen, or pelvis. Specifically, no right rib fracture is seen. No acute cardiopulmonary abnormality. Stable ancillary findings in the abdomen/pelvis. Electronically Signed   By: Julian Hy M.D.   On: 09/14/2021 20:32   DG CHEST PORT 1 VIEW  Result Date: 09/16/2021 CLINICAL DATA:  Congestive failure EXAM: PORTABLE CHEST 1 VIEW COMPARISON:  09/14/2021 CT FINDINGS:  Cardiac shadow is stable. Aortic calcifications are noted. Elevation of the left hemidiaphragm is noted with compressive atelectasis stable from the prior CT. No focal infiltrate or effusion is noted. No acute bony abnormality is seen. Prior fusion in the cervical spine is noted. IMPRESSION: No acute abnormality noted. Stable elevation of the left hemidiaphragm is noted. Electronically Signed   By: Inez Catalina M.D.   On: 09/16/2021 19:29   DG Swallowing Func-Speech Pathology  Result Date: 09/16/2021 Table formatting from the original result was not included. 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  DIABETES MELLITUS, TYPE II 77/82/4235  Diastolic dysfunction   on echo  Diverticulosis   ERECTILE DYSFUNCTION 08/25/2007  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  Heart murmur   but was not stated by DR Hilty  Hepatitis   took treatment  HEPATITIS C 08/25/2007  HYPERTENSION 08/25/2007  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  no per pt  HYPOGONADISM 09/27/2009 Past Surgical History: Past Surgical History: Procedure Laterality Date  ABDOMINAL HERNIA REPAIR  2000  ANTERIOR CERVICAL DECOMP/DISCECTOMY FUSION Right 03/04/2018  Procedure: Cervical Three-Four, Cervical Six-Seven Right Side approach, Anterior cervical decompression/discectomy/fusion;  Surgeon: Erline Levine, MD;  Location: [place];  Service: Neurosurgery;  Laterality: Right;  right side anterior approach  CHOLECYSTECTOMY  1999  COLONOSCOPY    LUMBAR FUSION  2005  LUMBAR LAMINECTOMY/DECOMPRESSION MICRODISCECTOMY Left 07/05/2021  Procedure: Left Thoracic 12 to Lumbar 1 Transpedicular microdiscectomy;  Surgeon: Erline Levine, MD;  Location: [place];  Service: Neurosurgery;  Laterality: Left;  3C/RM 21  TONSILLECTOMY AND ADENOIDECTOMY    UPPER GASTROINTESTINAL ENDOSCOPY   HPI: Larry Robertson is a 80 y.o. male who presented via EMS for right facial droop, right sided weakness, and slurred speech. MRI 10/15: "Acute infarct in the left pons." Chest CT 10/15: "Eventration of left hemidiaphragm. Associated left  lower lobe and right basilar atelectasis."  Pt with a PMHx of GERD, DM II, HLD, OA, cancer, gout, Hep C, and HTN."   Subjective: Pt awake, alert, pleasant, participative Assessment / Plan / Recommendation CHL IP CLINICAL IMPRESSIONS 09/16/2021 Clinical Impression Pt demonstrates a primarily oral dysphagia with decreased lingual movement for bolus formation and propulsion with moderate buccal and floor of mouth residue on the right as well as anterior spillage. After oral care, removal of dentures and some practice, pt was able to significantly improve oral control of boluses. Thin liquids spill posteriorally resulting in inconsistent sensation of trace aspriation before the swallow. With nectar thick liquids, bolus cohesion is better and pt does not aspirate unless bolus is larger with spillage over the epiglottis, which only occurred with straw sips. Small cups sips and spoonfuls toelrated well. Puree tolerated well. Did not trial solids at this time but expect pt could attempt soft solids when lingual mucosa moist, dentures in place and pt demonstrates independent management of right oral residue. Pt recommended to initaite puree and nectar thick liquids (no straw) with in-room suction set up. Recommend CIR at d/c. SLP Visit Diagnosis Dysphagia, unspecified (R13.10) Attention and concentration deficit following -- Frontal lobe and  executive function deficit following -- Impact on safety and function Risk for inadequate nutrition/hydration;Moderate aspiration risk   CHL IP TREATMENT RECOMMENDATION 09/15/2021 Treatment Recommendations Defer until completion of intrumental exam   Prognosis 09/16/2021 Prognosis for Safe Diet Advancement Good Barriers to Reach Goals -- Barriers/Prognosis Comment -- CHL IP DIET RECOMMENDATION 09/16/2021 SLP Diet Recommendations Dysphagia 1 (Puree) solids;Nectar thick liquid Liquid Administration via Cup;Spoon Medication Administration Crushed with puree Compensations -- Postural Changes --   CHL IP OTHER RECOMMENDATIONS 09/16/2021 Recommended Consults -- Oral Care Recommendations Oral care before and  after PO Other Recommendations Order thickener from pharmacy;Have oral suction available   CHL IP FOLLOW UP RECOMMENDATIONS 09/16/2021 Follow up Recommendations Inpatient Rehab   CHL IP FREQUENCY AND DURATION 09/16/2021 Speech Therapy Frequency (ACUTE ONLY) min 2x/week Treatment Duration 2 weeks      CHL IP ORAL PHASE 09/16/2021 Oral Phase Impaired Oral - Pudding Teaspoon -- Oral - Pudding Cup -- Oral - Honey Teaspoon -- Oral - Honey Cup Lingual/palatal residue;Decreased bolus cohesion;Delayed oral transit Oral - Nectar Teaspoon Lingual/palatal residue;Decreased bolus cohesion;Delayed oral transit Oral - Nectar Cup Lingual/palatal residue;Decreased bolus cohesion;Delayed oral transit Oral - Nectar Straw Lingual/palatal residue;Decreased bolus cohesion;Delayed oral transit Oral - Thin Teaspoon Lingual/palatal residue;Decreased bolus cohesion;Delayed oral transit Oral - Thin Cup -- Oral - Thin Straw -- Oral - Puree Lingual/palatal residue;Decreased bolus cohesion;Delayed oral transit Oral - Mech Soft -- Oral - Regular NT Oral - Multi-Consistency -- Oral - Pill -- Oral Phase - Comment --  CHL IP PHARYNGEAL PHASE 09/16/2021 Pharyngeal Phase Impaired Pharyngeal- Pudding Teaspoon -- Pharyngeal -- Pharyngeal- Pudding Cup -- Pharyngeal -- Pharyngeal- Honey Teaspoon -- Pharyngeal -- Pharyngeal- Honey Cup Delayed swallow initiation-vallecula;Pharyngeal residue - valleculae Pharyngeal -- Pharyngeal- Nectar Teaspoon WFL Pharyngeal -- Pharyngeal- Nectar Cup WFL Pharyngeal -- Pharyngeal- Nectar Straw Delayed swallow initiation-vallecula;Delayed swallow initiation-pyriform sinuses;Penetration/Aspiration before swallow Pharyngeal Material enters airway, passes BELOW cords without attempt by patient to eject out (silent aspiration) Pharyngeal- Thin Teaspoon Delayed swallow initiation-pyriform sinuses;Penetration/Aspiration before swallow Pharyngeal Material enters airway, passes BELOW cords and not ejected out despite cough attempt  by patient;Material enters airway, passes BELOW cords then ejected out Pharyngeal- Thin Cup -- Pharyngeal -- Pharyngeal- Thin Straw -- Pharyngeal -- Pharyngeal- Puree -- Pharyngeal -- Pharyngeal- Mechanical Soft -- Pharyngeal -- Pharyngeal- Regular -- Pharyngeal -- Pharyngeal- Multi-consistency -- Pharyngeal -- Pharyngeal- Pill -- Pharyngeal -- Pharyngeal Comment --  No flowsheet data found. DeBlois, Katherene Ponto 09/16/2021, 2:15 PM              EEG adult  Result Date: 09/14/2021 Greta Doom, MD     09/14/2021  3:56 PM History: 80 yo M with weakness and slurred speech Sedation: None Technique: This EEG was acquired with electrodes placed according to the International 10-20 electrode system (including Fp1, Fp2, F3, F4, C3, C4, P3, P4, O1, O2, T3, T4, T5, T6, A1, A2, Fz, Cz, Pz). The following electrodes were missing or displaced: none. Background: The background consists of intermixed alpha and beta activities. There is a well defined posterior dominant rhythm of 9 Hz that attenuates with eye opening. There is increasing delta with drowsiness. Sleep is recorded with normal appearing structures. Photic stimulation: Physiologic driving is not performed EEG Abnormalities: none Clinical Interpretation: This normal EEG is recorded in the waking and sleep state. There was no seizure or seizure predisposition recorded on this study. Please note that lack of epileptiform activity on EEG does not preclude the possibility of epilepsy. Roland Rack, MD [facility] [phone]  If 7pm- 7am, please page neurology on call as listed in [place].   CT HEAD CODE STROKE WO CONTRAST  Result Date: 09/14/2021 CLINICAL DATA:  Code stroke.  Neuro deficit, acute, stroke suspected EXAM: CT HEAD WITHOUT CONTRAST TECHNIQUE: Contiguous axial images were obtained from the base of the skull through the vertex without intravenous contrast. COMPARISON:  12/29/2008. FINDINGS: Brain: No evidence of acute large  vascular territory infarction, hemorrhage, hydrocephalus, extra-axial collection or mass lesion/mass effect. Remote right cerebellar infarct. Vascular: No hyperdense vessel identified. Skull: No acute fracture. Sinuses/Orbits: Mild paranasal sinus mucosal thickening. No acute orbital findings. Other: No mastoid effusions. ASPECTS [facility] Stroke [facility] Early CT Score) total score (0-10 with 10 being normal): 10. IMPRESSION: 1. No evidence of acute large vascular territory infarct or acute hemorrhage. ASPECTS is 10. 2. Remote right cerebellar infarct. Code stroke imaging results were communicated on 09/14/2021 at 1:32 pm to provider Dr. Theda Sers via secure text paging. CT head Electronically Signed   By: Margaretha Sheffield M.D.   On: 09/14/2021 13:33   CT ANGIO HEAD CODE STROKE  Result Date: 09/14/2021 CLINICAL DATA:  Neuro deficit, acute, stroke suspected; Stroke/TIA, assess extracranial arteries EXAM: CT ANGIOGRAPHY HEAD AND NECK TECHNIQUE: Multidetector CT imaging of the head and neck was performed using the standard protocol during bolus administration of intravenous contrast. Multiplanar CT image reconstructions and MIPs were obtained to evaluate the vascular anatomy. Carotid stenosis measurements (when applicable) are obtained utilizing NASCET criteria, using the distal internal carotid diameter as the denominator. CONTRAST:  146mL OMNIPAQUE IOHEXOL 350 MG/ML SOLN COMPARISON:  None FINDINGS: CTA NECK Aortic arch: Mild calcified plaque along the arch and patent great vessel origins. There is aberrant origin of the right subclavian artery. No high-grade proximal subclavian stenosis. Right carotid system: Patent. Mixed plaque along the proximal internal carotid with nearly 50% stenosis. Left carotid system: Patent. Noncalcified plaque along the proximal internal carotid with less than 50% stenosis. Vertebral arteries: Left vertebral artery is patent. There is no significant enhancement of the right vertebral  artery from the origin to the V3 segment where there is partial reconstitution Skeleton: Postoperative changes of anterior fusion at C3-C5 and C6-C7. Cervical spine degenerative changes. Other neck: Unremarkable. Upper chest: No apical lung mass. Review of the MIP images confirms the above findings CTA HEAD Anterior circulation: Intracranial internal carotid arteries are patent. There is calcified plaque along cavernous and supraclinoid portions mild to moderate stenosis. Anterior and middle cerebral arteries are patent. Posterior circulation: Intracranial right vertebral artery is patent but irregular with mixed plaque and areas of moderate to marked stenosis. The right PICA origin is patent. Intracranial left vertebral artery is patent. Is basilar artery is patent. Other major cerebellar artery origins are patent. Posterior cerebral arteries are patent. There is a right posterior communicating artery present. Venous sinuses: Patent as allowed by contrast bolus timing. 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 The intracranial portion is patent but regular with stenoses. Plaque at the proximal right ICA with nearly 50% stenosis. Plaque at the proximal left ICA with less than 50% stenosis. Initial results were provided by telephone at the time of interpretation on 09/14/2021 at 2:00 pm to provider [facility] , who verbally acknowledged these results. Electronically Signed   By: Macy Mis M.D.   On: 09/14/2021 14:22   CT ANGIO NECK CODE STROKE  Result Date: 09/14/2021 CLINICAL DATA:  Neuro  deficit, acute, stroke suspected; Stroke/TIA, assess extracranial arteries EXAM: CT ANGIOGRAPHY HEAD AND NECK TECHNIQUE: Multidetector CT imaging of the head and neck was performed using the standard protocol during bolus administration of intravenous contrast. Multiplanar CT  image reconstructions and MIPs were obtained to evaluate the vascular anatomy. Carotid stenosis measurements (when applicable) are obtained utilizing NASCET criteria, using the distal internal carotid diameter as the denominator. CONTRAST:  128mL OMNIPAQUE IOHEXOL 350 MG/ML SOLN COMPARISON:  None FINDINGS: CTA NECK Aortic arch: Mild calcified plaque along the arch and patent great vessel origins. There is aberrant origin of the right subclavian artery. No high-grade proximal subclavian stenosis. Right carotid system: Patent. Mixed plaque along the proximal internal carotid with nearly 50% stenosis. Left carotid system: Patent. Noncalcified plaque along the proximal internal carotid with less than 50% stenosis. Vertebral arteries: Left vertebral artery is patent. There is no significant enhancement of the right vertebral artery from the origin to the V3 segment where there is partial reconstitution Skeleton: Postoperative changes of anterior fusion at C3-C5 and C6-C7. Cervical spine degenerative changes. Other neck: Unremarkable. Upper chest: No apical lung mass. Review of the MIP images confirms the above findings CTA HEAD Anterior circulation: Intracranial internal carotid arteries are patent. There is calcified plaque along cavernous and supraclinoid portions mild to moderate stenosis. Anterior and middle cerebral arteries are patent. Posterior circulation: Intracranial right vertebral artery is patent but irregular with mixed plaque and areas of moderate to marked stenosis. The right PICA origin is patent. Intracranial left vertebral artery is patent. Is basilar artery is patent. Other major cerebellar artery origins are patent. Posterior cerebral arteries are patent. There is a right posterior communicating artery present. Venous sinuses: Patent as allowed by contrast bolus timing. 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 The intracranial portion is patent but regular with stenoses. Plaque at the proximal right ICA with nearly 50% stenosis. Plaque at the proximal left ICA with less than 50% stenosis. Initial results were provided by telephone at the time of interpretation on 09/14/2021 at 2:00 pm to provider [facility] , who verbally acknowledged these results. Electronically Signed   By: Macy Mis M.D.   On: 09/14/2021 14:22      HISTORY OF PRESENT ILLNESS  Mr. Larry Robertson is a 80 y.o. male with history of diabetes, hypertension, hyperlipidemia, hep C admitted for right facial droop, right-sided weakness and slurred speech.  TNK given  HOSPITAL COURSE Mr. Larry Robertson is a 80 y.o. male with history of diabetes, hypertension, hyperlipidemia, hep C admitted for right facial droop, right-sided weakness and slurred speech.  TNK given.   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, right VA occlusion MRI left pontine infarct EEG no seizure Repeat CT no hemorrhage 2D Echo EF 60-65% LDL 109 HgbA1c 7.4 Lovenox for VTE prophylaxis No antithrombotic prior to admission, now ASA 81mg  and Plavix 75 mg daily x 3 weeks then ASA alone  Patient counseled to be compliant with his antithrombotic medications Ongoing aggressive stroke risk factor management Therapy recommendations: SNF Disposition: SNF today   Diabetes HgbA1c 7.4 goal < 7.0 Uncontrolled CBG monitoring SSI DM education and close PCP follow up   Hypertension Stable on the high end Labetalol IV as needed Resume home BP meds. On amlodipine 10, avapro 300 and  clonidine 0.1 bid Long term BP goal normotensive   Hyperlipidemia Home meds: Lipitor 20 LDL 109, goal < 70 Now on Lipitor 40 Continue statin at discharge         Dysphagia Now on dys1 nectar thick liquid Speech on  board Decrease IVF from 75->40 cc/h Encourage po intake   Gout, right knee - improved Home mdes - allopurinol, colchicine, prednisone  Currently right knee swallow, but joint pain resolved  X-ray showed right knee moderate joint effusion Resume allopurinol 100 daily Colchicine 1.2 x 1 followed by 0.6 second day Prednisone 30mg  x 1   Other Stroke Risk Factors Advanced age   Other Active Problems Hep C  DISCHARGE EXAM Blood pressure (!) 154/82, pulse 74, temperature 98.9 F (37.2 C), temperature source Axillary, resp. rate 19, weight 94.6 kg, SpO2 97 %.   Temp:  [98.5 F (36.9 C)-99.2 F (37.3 C)] 98.9 F (37.2 C) (10/18 1128) Pulse Rate:  [73-103] 74 (10/18 1128) Resp:  [19-23] 19 (10/18 1128) BP: (152-181)/(74-97) 154/82 (10/18 1128) SpO2:  [93 %-99 %] 97 % (10/18 1128)  General - Well nourished, well developed, in no apparent distress.  Ophthalmologic - fundi not visualized due to noncooperation.  Cardiovascular - Regular rhythm and rate.  Musculoskeletal - right knee swollen, but no redness or warm, not more tenderness to touch today  Neuro - awake, alert, eyes open, orientated to age, place, time and people. No aphasia, moderate to severe dysarthria, following all simple commands. Able to name and repeat with dysarthric voice. No gaze palsy but with left gaze he has left directed nystagmus, tracking bilaterally, visual field full, PERRL. Right facial droop. Tongue protrusion to the right. LUE 4+/5 and LLE 3/5 proximal and 5/5 distal. Sensation symmetrical bilaterally, left FTN intact grossly, gait not tested.   Discharge Diet       Diet   DIET - DYS 1 Room service appropriate? Yes; Fluid consistency: Nectar Thick   liquids  DISCHARGE PLAN Disposition:  SNF aspirin 81 mg daily and clopidogrel 75 mg daily for secondary stroke prevention for 3 weeks then ASA 81 mg  alone. Ongoing stroke risk factor control by Primary Care Physician at time of discharge Follow-up  PCP Libby Maw, MD in 2 weeks. Follow-up in [facility] in 4 weeks, office to schedule an appointment.   45 minutes were spent preparing discharge.  Rosalin Hawking, MD PhD Stroke Neurology 09/17/2021 5:39 PM

## 2021-09-17 NOTE — TOC Transition Note (Signed)
Transition of Care [facility]) - CM/SW Discharge Note   Patient Details  Name: Larry Robertson MRN: [id number] Date of Birth: [birth year]  Transition of Care [facility]) CM/SW Contact:  Pollie Friar, RN Phone Number: 09/17/2021, 2:11 PM   Clinical Narrative:    Patient is discharging to [facility] SNF today. [facility] to provide transport. Bedside RN updated and d/c packet is at the desk.   Number for report: [phone]   Final next level of care: Skilled Nursing Facility Barriers to Discharge: No Barriers Identified   Patient Goals and CMS Choice Patient states their goals for this hospitalization and ongoing recovery are:: to get rehab CMS Medicare.gov Compare Post Acute Care list provided to:: Patient Represenative (must comment) Choice offered to / list presented to : Adult Children  Discharge Placement PASRR number recieved: 09/17/21            Patient chooses bed at: [facility] Patient to be transferred to facility by: Vicie Mutters Name of family member notified: Maria--daughter Patient and family notified of of transfer: 09/17/21  Discharge Plan and Services     Post Acute Care Choice: [place]                               Social Determinants of Health (SDOH) Interventions     Readmission Risk Interventions No flowsheet data found.

## 2021-09-17 NOTE — Progress Notes (Signed)
Physical Therapy Treatment Patient Details Name: Larry Robertson MRN: [id number] DOB: [birth year] Today's Date: 09/17/2021   History of Present Illness 80yo male who presented on 10/15 after a witnessed fall at home, family recognized stroke symptoms and called 911. 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 Received TNK 10/15. PMH skin CA, DM, diastolic dysfunction, hepatitis C, HTN    PT Comments    Pt received in supine, agreeable to therapy session and with good participation in supine/seated LE exercises and long sitting for core strengthening/seated balance progression. Pt with noted posterolateral lean to R with long sit in bed, but with good LUE support of side rail able to sit ~3 minutes at a time prior to needing rest break. Pt needing maxA initially for stability in long sit but progressed to modA and able to perform L/R leans with cues. Pt needing PROM for RUE/LE repositioning and able to perform LUE/LLE exercises with AROM and some AA for SLR and improved ROM with L shoulder flexion. Pt reports some L shoulder discomfort and at IV site. RN called to room to assist pt with full supervision during meal per SLP recommendation at end of session. Pt continues to benefit from PT services to progress toward functional mobility goals.    Recommendations for follow up therapy are one component of a multi-disciplinary discharge planning process, led by the attending physician.  Recommendations may be updated based on patient status, additional functional criteria and insurance authorization.  Follow Up Recommendations  CIR;Supervision/Assistance - 24 hour     Equipment Recommendations  Other (comment) (TBD as he progresses)    Recommendations for Other Services       Precautions / Restrictions Precautions Precautions: Fall;Other (comment) Precaution Comments: R hemi, poor balance Restrictions Weight Bearing Restrictions: No     Mobility  Bed Mobility Overal bed mobility: Needs Assistance Bed  Mobility: Supine to Sit     Supine to sit: Max assist;HOB elevated     General bed mobility comments: supine to long sit from elevated HOB, pt able to pull up with LUE on side rail and needing mod/maxA trunk support for safety to remain upright    Transfers Overall transfer level: Needs assistance               General transfer comment: defer OOB due to arrival of dinner and per RN PTAR planned for 6pm so limited time to eat.  Ambulation/Gait                 Stairs             Wheelchair Mobility    Modified Rankin (Stroke Patients Only) Modified Rankin (Stroke Patients Only) Pre-Morbid Rankin Score: No symptoms Modified Rankin: Severe disability     Balance Overall balance assessment: Needs assistance Sitting-balance support: Feet supported;Single extremity supported Sitting balance-Leahy Scale: Poor Sitting balance - Comments: Mod cues and facilitation for upright/midline sitting, heavy reliance on L side rail to remain in long sit position in bed Postural control: Posterior lean;Right lateral lean     Standing balance comment: defer OOB transfers due to arrival of dinner and pt preparing for transport to SNF soon.                            Cognition Arousal/Alertness: Awake/alert Behavior During Therapy: Flat affect Overall Cognitive Status: Within Functional Limits for tasks assessed  General Comments: good following of 1-step cues, pt reporting not feeling hungry although when dinner tray set in front of him, pt preparing to eat and self-initiating with spoon.      Exercises General Exercises - Upper Extremity Shoulder Flexion: 10 reps;Supine;AAROM;Left;PROM;Right (AA on L, PROM on RUE) Elbow Flexion: PROM;Right;10 reps;Supine;AROM;Left (PROM on R) Elbow Extension: PROM;Right;10 reps;Supine;Left (AROM on L) Wrist Flexion: PROM;Right;10 reps;Supine Wrist Extension: PROM;Right;10  reps;Seated General Exercises - Lower Extremity Ankle Circles/Pumps: PROM;Right;10 reps;Supine;AROM;Left (AROM on L) Short Arc Quad: AROM;PROM;Both;10 reps;Other (comment) (bed in chair position, AROM on LLE) Heel Slides: PROM;Right;10 reps;Supine;AROM;Left (PROM on RLE, AROM on L) Straight Leg Raises: AAROM;Left;10 reps;Supine    General Comments General comments (skin integrity, edema, etc.): VSS on RA prior to session and no acute s/sx distress throughout      Pertinent Vitals/Pain Pain Assessment: Faces Faces Pain Scale: Hurts a little bit Pain Location: L UE (IV in elbow) and when reaching end range flexion Pain Descriptors / Indicators: Aching;Discomfort Pain Intervention(s): Limited activity within patient's tolerance;Monitored during session;Repositioned    Home Living                      Prior Function            PT Goals (current goals can now be found in the care plan section) Acute Rehab PT Goals Patient Stated Goal: get better/rehab PT Goal Formulation: With patient Time For Goal Achievement: 09/30/21 Progress towards PT goals: Progressing toward goals    Frequency    Min 4X/week      PT Plan Current plan remains appropriate    Co-evaluation              AM-PAC PT "6 Clicks" Mobility   Outcome Measure  Help needed turning from your back to your side while in a flat bed without using bedrails?: A Lot Help needed moving from lying on your back to sitting on the side of a flat bed without using bedrails?: Total Help needed moving to and from a bed to a chair (including a wheelchair)?: Total Help needed standing up from a chair using your arms (e.g., wheelchair or bedside chair)?: Total Help needed to walk in hospital room?: Total Help needed climbing 3-5 steps with a railing? : Total 6 Click Score: 7    End of Session   Activity Tolerance: Patient tolerated treatment well Patient left: in bed;with call bell/phone within reach;with bed  alarm set;with nursing/sitter in room (bed in chair position and set up to eat dinner, RN in room to supervise/for safety) Nurse Communication: Mobility status;Need for lift equipment;Other (comment) (pt needs supervision for eating per SLP info taped above bed) PT Visit Diagnosis: Unsteadiness on feet (R26.81);Hemiplegia and hemiparesis;Other abnormalities of gait and mobility (R26.89);Difficulty in walking, not elsewhere classified (R26.2) Hemiplegia - Right/Left: Right Hemiplegia - dominant/non-dominant: Dominant Hemiplegia - caused by: Cerebral infarction     Time: 7672-0947 PT Time Calculation (min) (ACUTE ONLY): 18 min  Charges:  $Therapeutic Exercise: 8-22 mins                     Jacarius Handel P., PTA Acute Rehabilitation Services Pager: [phone] Office: [place] 09/17/2021, 5:38 PM

## 2021-09-17 NOTE — Progress Notes (Signed)
Pt has been discharged from the unit via PTAR. All belongings has been sent with the patient. Pt sent in good spirits.

## 2021-09-17 NOTE — Progress Notes (Signed)
Echocardiogram 2D Echocardiogram has been performed.  Oneal Deputy [place] Hedman RDCS 09/17/2021, 1:37 PM

## 2021-09-25 ENCOUNTER — Inpatient Hospital Stay (HOSPITAL_COMMUNITY)
Admission: EM | Admit: 2021-09-25 | Discharge: 2021-10-01 | DRG: 871 | Disposition: E | Payer: Medicare Other | Source: Skilled Nursing Facility | Attending: Pulmonary Disease | Admitting: Pulmonary Disease

## 2021-09-25 ENCOUNTER — Emergency Department (HOSPITAL_COMMUNITY): Payer: Medicare Other

## 2021-09-25 ENCOUNTER — Other Ambulatory Visit: Payer: Self-pay

## 2021-09-25 ENCOUNTER — Encounter (HOSPITAL_COMMUNITY): Payer: Self-pay

## 2021-09-25 ENCOUNTER — Inpatient Hospital Stay (HOSPITAL_COMMUNITY): Payer: Medicare Other

## 2021-09-25 DIAGNOSIS — E870 Hyperosmolality and hypernatremia: Secondary | ICD-10-CM | POA: Diagnosis present

## 2021-09-25 DIAGNOSIS — Z8619 Personal history of other infectious and parasitic diseases: Secondary | ICD-10-CM

## 2021-09-25 DIAGNOSIS — A419 Sepsis, unspecified organism: Secondary | ICD-10-CM | POA: Diagnosis not present

## 2021-09-25 DIAGNOSIS — J9601 Acute respiratory failure with hypoxia: Secondary | ICD-10-CM | POA: Diagnosis present

## 2021-09-25 DIAGNOSIS — R627 Adult failure to thrive: Secondary | ICD-10-CM | POA: Diagnosis present

## 2021-09-25 DIAGNOSIS — Z7982 Long term (current) use of aspirin: Secondary | ICD-10-CM

## 2021-09-25 DIAGNOSIS — K7460 Unspecified cirrhosis of liver: Secondary | ICD-10-CM | POA: Diagnosis present

## 2021-09-25 DIAGNOSIS — E874 Mixed disorder of acid-base balance: Secondary | ICD-10-CM | POA: Diagnosis present

## 2021-09-25 DIAGNOSIS — R6521 Severe sepsis with septic shock: Secondary | ICD-10-CM | POA: Diagnosis present

## 2021-09-25 DIAGNOSIS — Z7902 Long term (current) use of antithrombotics/antiplatelets: Secondary | ICD-10-CM

## 2021-09-25 DIAGNOSIS — E1122 Type 2 diabetes mellitus with diabetic chronic kidney disease: Secondary | ICD-10-CM | POA: Diagnosis present

## 2021-09-25 DIAGNOSIS — Z8673 Personal history of transient ischemic attack (TIA), and cerebral infarction without residual deficits: Secondary | ICD-10-CM | POA: Diagnosis not present

## 2021-09-25 DIAGNOSIS — Z981 Arthrodesis status: Secondary | ICD-10-CM

## 2021-09-25 DIAGNOSIS — Z9181 History of falling: Secondary | ICD-10-CM | POA: Diagnosis not present

## 2021-09-25 DIAGNOSIS — N1832 Chronic kidney disease, stage 3b: Secondary | ICD-10-CM | POA: Diagnosis present

## 2021-09-25 DIAGNOSIS — J189 Pneumonia, unspecified organism: Secondary | ICD-10-CM | POA: Diagnosis present

## 2021-09-25 DIAGNOSIS — I129 Hypertensive chronic kidney disease with stage 1 through stage 4 chronic kidney disease, or unspecified chronic kidney disease: Secondary | ICD-10-CM | POA: Diagnosis present

## 2021-09-25 DIAGNOSIS — E1165 Type 2 diabetes mellitus with hyperglycemia: Secondary | ICD-10-CM | POA: Diagnosis present

## 2021-09-25 DIAGNOSIS — Z20822 Contact with and (suspected) exposure to covid-19: Secondary | ICD-10-CM | POA: Diagnosis present

## 2021-09-25 DIAGNOSIS — J690 Pneumonitis due to inhalation of food and vomit: Secondary | ICD-10-CM | POA: Diagnosis present

## 2021-09-25 DIAGNOSIS — R1310 Dysphagia, unspecified: Secondary | ICD-10-CM | POA: Diagnosis present

## 2021-09-25 DIAGNOSIS — Z79899 Other long term (current) drug therapy: Secondary | ICD-10-CM

## 2021-09-25 DIAGNOSIS — G9341 Metabolic encephalopathy: Secondary | ICD-10-CM | POA: Diagnosis present

## 2021-09-25 DIAGNOSIS — E785 Hyperlipidemia, unspecified: Secondary | ICD-10-CM | POA: Diagnosis present

## 2021-09-25 DIAGNOSIS — Z833 Family history of diabetes mellitus: Secondary | ICD-10-CM

## 2021-09-25 DIAGNOSIS — N179 Acute kidney failure, unspecified: Secondary | ICD-10-CM | POA: Diagnosis present

## 2021-09-25 DIAGNOSIS — M109 Gout, unspecified: Secondary | ICD-10-CM | POA: Diagnosis present

## 2021-09-25 DIAGNOSIS — Z66 Do not resuscitate: Secondary | ICD-10-CM | POA: Diagnosis present

## 2021-09-25 DIAGNOSIS — Z515 Encounter for palliative care: Secondary | ICD-10-CM

## 2021-09-25 DIAGNOSIS — Z7984 Long term (current) use of oral hypoglycemic drugs: Secondary | ICD-10-CM

## 2021-09-25 DIAGNOSIS — E440 Moderate protein-calorie malnutrition: Secondary | ICD-10-CM | POA: Diagnosis present

## 2021-09-25 DIAGNOSIS — Z823 Family history of stroke: Secondary | ICD-10-CM

## 2021-09-25 LAB — CBC WITH DIFFERENTIAL/PLATELET
Abs Immature Granulocytes: 0 10*3/uL (ref 0.00–0.07)
Basophils Absolute: 0 10*3/uL (ref 0.0–0.1)
Basophils Relative: 0 %
Eosinophils Absolute: 0 10*3/uL (ref 0.0–0.5)
Eosinophils Relative: 0 %
HCT: 50.1 % (ref 39.0–52.0)
Hemoglobin: 15.3 g/dL (ref 13.0–17.0)
Lymphocytes Relative: 3 %
Lymphs Abs: 0.8 10*3/uL (ref 0.7–4.0)
MCH: 30.1 pg (ref 26.0–34.0)
MCHC: 30.5 g/dL (ref 30.0–36.0)
MCV: 98.6 fL (ref 80.0–100.0)
Monocytes Absolute: 0 10*3/uL — ABNORMAL LOW (ref 0.1–1.0)
Monocytes Relative: 0 %
Neutro Abs: 24.8 10*3/uL — ABNORMAL HIGH (ref 1.7–7.7)
Neutrophils Relative %: 97 %
Platelets: 443 10*3/uL — ABNORMAL HIGH (ref 150–400)
RBC: 5.08 MIL/uL (ref 4.22–5.81)
RDW: 13.6 % (ref 11.5–15.5)
WBC: 25.6 10*3/uL — ABNORMAL HIGH (ref 4.0–10.5)
nRBC: 0 % (ref 0.0–0.2)
nRBC: 0 /100 WBC

## 2021-09-25 LAB — PROCALCITONIN: Procalcitonin: 11.36 ng/mL

## 2021-09-25 LAB — I-STAT ARTERIAL BLOOD GAS, ED
Acid-base deficit: 7 mmol/L — ABNORMAL HIGH (ref 0.0–2.0)
Acid-base deficit: 5 mmol/L — ABNORMAL HIGH (ref 0.0–2.0)
Bicarbonate: 21.2 mmol/L (ref 20.0–28.0)
Bicarbonate: 20.5 mmol/L (ref 20.0–28.0)
Calcium, Ion: 1.21 mmol/L (ref 1.15–1.40)
Calcium, Ion: 1.19 mmol/L (ref 1.15–1.40)
HCT: 38 % — ABNORMAL LOW (ref 39.0–52.0)
HCT: 38 % — ABNORMAL LOW (ref 39.0–52.0)
Hemoglobin: 12.9 g/dL — ABNORMAL LOW (ref 13.0–17.0)
Hemoglobin: 12.9 g/dL — ABNORMAL LOW (ref 13.0–17.0)
O2 Saturation: 95 %
O2 Saturation: 99 %
Patient temperature: 102
Patient temperature: 99.7
Potassium: 3.4 mmol/L — ABNORMAL LOW (ref 3.5–5.1)
Potassium: 3.7 mmol/L (ref 3.5–5.1)
Sodium: 157 mmol/L — ABNORMAL HIGH (ref 135–145)
Sodium: 155 mmol/L — ABNORMAL HIGH (ref 135–145)
TCO2: 23 mmol/L (ref 22–32)
TCO2: 22 mmol/L (ref 22–32)
pCO2 arterial: 58.6 mmHg — ABNORMAL HIGH (ref 32.0–48.0)
pCO2 arterial: 41.2 mmHg (ref 32.0–48.0)
pH, Arterial: 7.178 — CL (ref 7.350–7.450)
pH, Arterial: 7.307 — ABNORMAL LOW (ref 7.350–7.450)
pO2, Arterial: 104 mmHg (ref 83.0–108.0)
pO2, Arterial: 164 mmHg — ABNORMAL HIGH (ref 83.0–108.0)

## 2021-09-25 LAB — TROPONIN I (HIGH SENSITIVITY)
Troponin I (High Sensitivity): 39 ng/L — ABNORMAL HIGH (ref <18)
Troponin I (High Sensitivity): 53 ng/L — ABNORMAL HIGH (ref <18)

## 2021-09-25 LAB — URINALYSIS, ROUTINE W REFLEX MICROSCOPIC
Bilirubin Urine: NEGATIVE
Glucose, UA: 50 mg/dL — AB
Hgb urine dipstick: NEGATIVE
Ketones, ur: NEGATIVE mg/dL
Leukocytes,Ua: NEGATIVE
Nitrite: NEGATIVE
Protein, ur: 30 mg/dL — AB
Specific Gravity, Urine: 1.023 (ref 1.005–1.030)
pH: 5 (ref 5.0–8.0)

## 2021-09-25 LAB — COMPREHENSIVE METABOLIC PANEL
ALT: 38 U/L (ref 0–44)
AST: 33 U/L (ref 15–41)
Albumin: 3.4 g/dL — ABNORMAL LOW (ref 3.5–5.0)
Alkaline Phosphatase: 61 U/L (ref 38–126)
Anion gap: 18 — ABNORMAL HIGH (ref 5–15)
BUN: 85 mg/dL — ABNORMAL HIGH (ref 8–23)
CO2: 19 mmol/L — ABNORMAL LOW (ref 22–32)
Calcium: 9.2 mg/dL (ref 8.9–10.3)
Chloride: 115 mmol/L — ABNORMAL HIGH (ref 98–111)
Creatinine, Ser: 2.54 mg/dL — ABNORMAL HIGH (ref 0.61–1.24)
GFR, Estimated: 25 mL/min — ABNORMAL LOW (ref >60)
Glucose, Bld: 417 mg/dL — ABNORMAL HIGH (ref 70–99)
Potassium: 3.7 mmol/L (ref 3.5–5.1)
Sodium: 152 mmol/L — ABNORMAL HIGH (ref 135–145)
Total Bilirubin: 1.5 mg/dL — ABNORMAL HIGH (ref 0.3–1.2)
Total Protein: 6.9 g/dL (ref 6.5–8.1)

## 2021-09-25 LAB — PROTIME-INR
INR: 1.4 — ABNORMAL HIGH (ref 0.8–1.2)
INR: 1.5 — ABNORMAL HIGH (ref 0.8–1.2)
Prothrombin Time: 17 seconds — ABNORMAL HIGH (ref 11.4–15.2)
Prothrombin Time: 18.2 seconds — ABNORMAL HIGH (ref 11.4–15.2)

## 2021-09-25 LAB — I-STAT CHEM 8, ED
BUN: 112 mg/dL — ABNORMAL HIGH (ref 8–23)
Calcium, Ion: 1.17 mmol/L (ref 1.15–1.40)
Chloride: 120 mmol/L — ABNORMAL HIGH (ref 98–111)
Creatinine, Ser: 2.3 mg/dL — ABNORMAL HIGH (ref 0.61–1.24)
Glucose, Bld: 413 mg/dL — ABNORMAL HIGH (ref 70–99)
HCT: 47 % (ref 39.0–52.0)
Hemoglobin: 16 g/dL (ref 13.0–17.0)
Potassium: 4.3 mmol/L (ref 3.5–5.1)
Sodium: 155 mmol/L — ABNORMAL HIGH (ref 135–145)
TCO2: 22 mmol/L (ref 22–32)

## 2021-09-25 LAB — LACTIC ACID, PLASMA
Lactic Acid, Venous: 5.3 mmol/L (ref 0.5–1.9)
Lactic Acid, Venous: 3.3 mmol/L (ref 0.5–1.9)

## 2021-09-25 LAB — HEMOGLOBIN A1C
Hgb A1c MFr Bld: 6.9 % — ABNORMAL HIGH (ref 4.8–5.6)
Mean Plasma Glucose: 151.33 mg/dL

## 2021-09-25 LAB — APTT
aPTT: 28 seconds (ref 24–36)
aPTT: 30 seconds (ref 24–36)

## 2021-09-25 LAB — RESP PANEL BY RT-PCR (FLU A&B, COVID) ARPGX2
Influenza A by PCR: NEGATIVE
Influenza B by PCR: NEGATIVE
SARS Coronavirus 2 by RT PCR: NEGATIVE

## 2021-09-25 LAB — LIPASE, BLOOD: Lipase: 391 U/L — ABNORMAL HIGH (ref 11–51)

## 2021-09-25 LAB — MRSA NEXT GEN BY PCR, NASAL: MRSA by PCR Next Gen: NOT DETECTED

## 2021-09-25 LAB — CBG MONITORING, ED
Glucose-Capillary: 343 mg/dL — ABNORMAL HIGH (ref 70–99)
Glucose-Capillary: 404 mg/dL — ABNORMAL HIGH (ref 70–99)

## 2021-09-25 LAB — STREP PNEUMONIAE URINARY ANTIGEN: Strep Pneumo Urinary Antigen: NEGATIVE

## 2021-09-25 LAB — AMYLASE: Amylase: 628 U/L — ABNORMAL HIGH (ref 28–100)

## 2021-09-25 LAB — CORTISOL: Cortisol, Plasma: 14 ug/dL

## 2021-09-25 LAB — GLUCOSE, CAPILLARY: Glucose-Capillary: 364 mg/dL — ABNORMAL HIGH (ref 70–99)

## 2021-09-25 MED ORDER — MIDAZOLAM-SODIUM CHLORIDE 100-0.9 MG/100ML-% IV SOLN
0.5000 mg/h | INTRAVENOUS | Status: DC
  Administered 2021-09-25: 0.5 mg/h via INTRAVENOUS
  Filled 2021-09-25: qty 100

## 2021-09-25 MED ORDER — MORPHINE 100MG IN NS 100ML (1MG/ML) PREMIX INFUSION
0.0000 mg/h | INTRAVENOUS | Status: DC
  Administered 2021-09-25: 5 mg/h via INTRAVENOUS
  Filled 2021-09-25: qty 100

## 2021-09-25 MED ORDER — INSULIN ASPART 100 UNIT/ML IJ SOLN
7.0000 [IU] | Freq: Once | INTRAMUSCULAR | Status: AC
  Administered 2021-09-25: 7 [IU] via INTRAVENOUS

## 2021-09-25 MED ORDER — GLYCOPYRROLATE 0.2 MG/ML IJ SOLN
0.2000 mg | INTRAMUSCULAR | Status: DC | PRN
  Administered 2021-09-25: 0.2 mg via INTRAVENOUS
  Filled 2021-09-25: qty 1

## 2021-09-25 MED ORDER — ORAL CARE MOUTH RINSE
15.0000 mL | OROMUCOSAL | Status: DC
  Administered 2021-09-25: 15 mL via OROMUCOSAL

## 2021-09-25 MED ORDER — MORPHINE BOLUS VIA INFUSION
5.0000 mg | INTRAVENOUS | Status: DC | PRN
  Administered 2021-09-25: 5 mg via INTRAVENOUS
  Filled 2021-09-25: qty 5

## 2021-09-25 MED ORDER — ETOMIDATE 2 MG/ML IV SOLN
INTRAVENOUS | Status: AC
  Filled 2021-09-25: qty 20

## 2021-09-25 MED ORDER — SODIUM CHLORIDE 0.9 % IV BOLUS
500.0000 mL | Freq: Once | INTRAVENOUS | Status: AC
  Administered 2021-09-25: 500 mL via INTRAVENOUS

## 2021-09-25 MED ORDER — PANTOPRAZOLE SODIUM 40 MG IV SOLR: 40.0000 mg | Freq: Every day | INTRAVENOUS | Status: DC

## 2021-09-25 MED ORDER — ETOMIDATE 2 MG/ML IV SOLN: 20.0000 mg | Freq: Once | INTRAVENOUS | Status: DC

## 2021-09-25 MED ORDER — DEXTROSE 5 % IV SOLN: INTRAVENOUS | Status: DC

## 2021-09-25 MED ORDER — NOREPINEPHRINE 4 MG/250ML-% IV SOLN
0.0000 ug/min | INTRAVENOUS | Status: DC
  Administered 2021-09-25: 10 ug/min via INTRAVENOUS
  Administered 2021-09-25: 20 ug/min via INTRAVENOUS
  Filled 2021-09-25 (×2): qty 250

## 2021-09-25 MED ORDER — SODIUM CHLORIDE 0.9 % IV SOLN: 2.0000 g | INTRAVENOUS | Status: DC

## 2021-09-25 MED ORDER — GLYCOPYRROLATE 1 MG PO TABS: 1.0000 mg | ORAL_TABLET | ORAL | Status: DC | PRN

## 2021-09-25 MED ORDER — ACETAMINOPHEN 325 MG PO TABS: 650.0000 mg | ORAL_TABLET | Freq: Four times a day (QID) | ORAL | Status: DC | PRN

## 2021-09-25 MED ORDER — MIDAZOLAM HCL 2 MG/2ML IJ SOLN
2.0000 mg | INTRAMUSCULAR | Status: DC | PRN
  Administered 2021-09-25: 2 mg via INTRAVENOUS
  Filled 2021-09-25: qty 2

## 2021-09-25 MED ORDER — CHLORHEXIDINE GLUCONATE 0.12% ORAL RINSE (MEDLINE KIT)
15.0000 mL | Freq: Two times a day (BID) | OROMUCOSAL | Status: DC
  Administered 2021-09-25: 15 mL via OROMUCOSAL

## 2021-09-25 MED ORDER — ETOMIDATE 2 MG/ML IV SOLN
INTRAVENOUS | Status: AC | PRN
  Administered 2021-09-25: 20 mg via INTRAVENOUS

## 2021-09-25 MED ORDER — MIDAZOLAM HCL 2 MG/2ML IJ SOLN
2.0000 mg | INTRAMUSCULAR | Status: DC | PRN
  Administered 2021-09-25: 4 mg via INTRAVENOUS

## 2021-09-25 MED ORDER — MORPHINE SULFATE (PF) 2 MG/ML IV SOLN
2.0000 mg | INTRAVENOUS | Status: DC | PRN
  Administered 2021-09-25: 2 mg via INTRAVENOUS

## 2021-09-25 MED ORDER — DOCUSATE SODIUM 100 MG PO CAPS: 100.0000 mg | ORAL_CAPSULE | Freq: Two times a day (BID) | ORAL | Status: DC | PRN

## 2021-09-25 MED ORDER — CHLORHEXIDINE GLUCONATE CLOTH 2 % EX PADS: 6.0000 | MEDICATED_PAD | Freq: Every day | CUTANEOUS | Status: DC

## 2021-09-25 MED ORDER — ACETAMINOPHEN 650 MG RE SUPP: 650.0000 mg | Freq: Four times a day (QID) | RECTAL | Status: DC | PRN

## 2021-09-25 MED ORDER — SODIUM CHLORIDE 0.9 % IV BOLUS
1000.0000 mL | Freq: Once | INTRAVENOUS | Status: AC
  Administered 2021-09-25: 1000 mL via INTRAVENOUS

## 2021-09-25 MED ORDER — ACETAMINOPHEN 650 MG RE SUPP
650.0000 mg | Freq: Once | RECTAL | Status: AC
  Administered 2021-09-25: 650 mg via RECTAL
  Filled 2021-09-25: qty 1

## 2021-09-25 MED ORDER — ROCURONIUM BROMIDE 10 MG/ML (PF) SYRINGE
PREFILLED_SYRINGE | INTRAVENOUS | Status: AC
  Filled 2021-09-25: qty 10

## 2021-09-25 MED ORDER — POLYETHYLENE GLYCOL 3350 17 G PO PACK: 17.0000 g | PACK | Freq: Every day | ORAL | Status: DC | PRN

## 2021-09-25 MED ORDER — GLYCOPYRROLATE 0.2 MG/ML IJ SOLN: 0.2000 mg | INTRAMUSCULAR | Status: DC | PRN

## 2021-09-25 MED ORDER — POLYVINYL ALCOHOL 1.4 % OP SOLN
1.0000 [drp] | Freq: Four times a day (QID) | OPHTHALMIC | Status: DC | PRN
  Filled 2021-09-25: qty 15

## 2021-09-25 MED ORDER — INSULIN ASPART 100 UNIT/ML IJ SOLN
0.0000 [IU] | INTRAMUSCULAR | Status: DC
  Administered 2021-09-25: 9 [IU] via SUBCUTANEOUS

## 2021-09-25 MED ORDER — INSULIN ASPART 100 UNIT/ML IJ SOLN: 10.0000 [IU] | Freq: Once | INTRAMUSCULAR | Status: DC

## 2021-09-25 MED ORDER — SODIUM CHLORIDE 0.9 % IV SOLN
2.0000 g | Freq: Two times a day (BID) | INTRAVENOUS | Status: DC
  Administered 2021-09-25: 2 g via INTRAVENOUS
  Filled 2021-09-25: qty 2

## 2021-09-25 MED ORDER — VANCOMYCIN HCL 1750 MG/350ML IV SOLN
1750.0000 mg | Freq: Once | INTRAVENOUS | Status: AC
  Administered 2021-09-25: 1750 mg via INTRAVENOUS
  Filled 2021-09-25: qty 350

## 2021-09-25 MED ORDER — FENTANYL 2500MCG IN NS 250ML (10MCG/ML) PREMIX INFUSION
0.0000 ug/h | INTRAVENOUS | Status: DC
Start: 2021-09-25 — End: 2021-09-25
  Administered 2021-09-25: 200 ug/h via INTRAVENOUS
  Filled 2021-09-25: qty 250

## 2021-09-25 MED ORDER — DIPHENHYDRAMINE HCL 50 MG/ML IJ SOLN: 25.0000 mg | INTRAMUSCULAR | Status: DC | PRN

## 2021-09-25 MED ORDER — PIPERACILLIN-TAZOBACTAM 3.375 G IVPB 30 MIN
3.3750 g | Freq: Once | INTRAVENOUS | Status: AC
  Administered 2021-09-25: 3.375 g via INTRAVENOUS
  Filled 2021-09-25: qty 50

## 2021-09-25 MED ORDER — ROCURONIUM BROMIDE 50 MG/5ML IV SOLN
INTRAVENOUS | Status: AC | PRN
  Administered 2021-09-25: 100 mg via INTRAVENOUS

## 2021-09-25 MED ORDER — VANCOMYCIN VARIABLE DOSE PER UNSTABLE RENAL FUNCTION (PHARMACIST DOSING): Status: DC

## 2021-09-25 MED ORDER — ROCURONIUM BROMIDE 50 MG/5ML IV SOLN
100.0000 mg | Freq: Once | INTRAVENOUS | Status: DC
  Filled 2021-09-25: qty 10

## 2021-09-26 LAB — URINE CULTURE: Culture: NO GROWTH

## 2021-09-30 LAB — CULTURE, BLOOD (ROUTINE X 2)
Culture: NO GROWTH
Culture: NO GROWTH
Special Requests: ADEQUATE

## 2021-10-01 NOTE — Progress Notes (Signed)
Pharmacy Antibiotic Note  Larry Robertson is a 80 y.o. male admitted on 09-30-21 with sepsis.  Pharmacy has been consulted for vancomycin dosing.  Presented from SNF with SOB and reports feeling "clammy" overnight. Febrile at 102 with WBC 25.6 and lactate 5.3. SCr 2.30 (BL ~0.8). Patient is now comfort care.   Plan: Vancomycin 1750 mg IV LD x1 Variable renal fx- PharmD to dose for maintenance Zosyn per MD Follow labs, vitals, cx, drug levels, etc.   Height: 6' 0.84" (185 cm) Weight: 95 kg (209 lb 7 oz) IBW/kg (Calculated) : 79.52  Temp (24hrs), Avg:102 F (38.9 C), Min:102 F (38.9 C), Max:102 F (38.9 C)  Recent Labs  Lab 30-Sep-2021 0538 30-Sep-2021 0618  WBC 25.6*  --   CREATININE 2.54* 2.30*  LATICACIDVEN 5.3*  --     Estimated Creatinine Clearance: 28.3 mL/min (A) (by C-G formula based on SCr of 2.3 mg/dL (H)).    Allergies  Allergen Reactions   Lyrica [Pregabalin] Other (See Comments)    Blurred vision   Gabapentin Rash and Other (See Comments)    Diplopia (double vision)    Antimicrobials this admission: Vancomycin 10/26>> Cefepime 10/26>>  Microbiology results: 10/26 BCx: in process  Thank you for allowing pharmacy to be a part of this patient's care.  Donald Pore 2021-09-30 8:17 AM

## 2021-10-01 NOTE — Sepsis Progress Note (Signed)
ELink following sepsis protocol 

## 2021-10-01 NOTE — Progress Notes (Signed)
Pharmacy Antibiotic Note  Larry Robertson is a 80 y.o. male admitted on 02-Oct-2021 with multi-organ failure.  Patient was recently discharged after management of pontine stroke.  Pharmacy has been consulted for vancomycin dosing for sepsis.    SCr 2.3 (BL SCr 0.79), CrCL 28 ml/min, Tmax 102, WBC 25.6, LA down to 3.3.  Plan: Vanc 1750mg  IV x 1 - dose per level, level in AM 10/27 F/U with Gram negative coverage   Height: 6' 0.84" (185 cm) Weight: 95 kg (209 lb 7 oz) IBW/kg (Calculated) : 79.52  Temp (24hrs), Avg:100.9 F (38.3 C), Min:99.7 F (37.6 C), Max:102 F (38.9 C)  Recent Labs  Lab October 02, 2021 0538 02-Oct-2021 0618 10-02-21 0920  WBC 25.6*  --   --   CREATININE 2.54* 2.30*  --   LATICACIDVEN 5.3*  --  3.3*    Estimated Creatinine Clearance: 28.3 mL/min (A) (by C-G formula based on SCr of 2.3 mg/dL (H)).    Allergies  Allergen Reactions   Lyrica [Pregabalin] Other (See Comments)    Blurred vision   Gabapentin Rash and Other (See Comments)    Diplopia (double vision)   Vanc 10/26 >> Zosyn x1 10/26   10/26 BCx -  10/26 UCx -  [name] D. Mina Marble, PharmD, BCPS, [place] Oct 02, 2021, 1:19 PM

## 2021-10-01 NOTE — H&P (Signed)
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  He presents with altered mental status, worsening renal failure, hypercarbia, elevated glucose and respiratory distress that required intubation emergency department.  His past medical history is extensive and well-documented below.  Pulmonary critical care is asked to admit due to the severity of his illness and the fact that he is in multiorgan dysfunction.  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   left hemidiaphragm elevation   DIABETES MELLITUS, TYPE II 27/25/3664   Diastolic dysfunction    on echo   Diverticulosis    ERECTILE DYSFUNCTION 08/25/2007   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   Heart murmur    but was not stated by DR Hilty   Hepatitis    took treatment   HEPATITIS C 08/25/2007   HYPERTENSION 08/25/2007   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   no per pt   HYPOGONADISM 09/27/2009     Significant Hospital Events: Including procedures, antibiotic start and stop dates in addition to other pertinent events   October 04, 2021 intubated and admitted to the intensive care unit  Interim History / Subjective:  80 year old recent pontine stroke now with sepsis picture shock respiratory distress admitted for further evaluation and  Objective   Blood pressure  115/74, pulse 98, temperature 99.7 F (37.6 C), temperature source Oral, resp. rate 16, height 6' 0.84" (1.85 m), weight 95 kg, SpO2 100 %.    Vent Mode: PRVC FiO2 (%):  [50 %-100 %] 50 % Set Rate:  [18 bmp] 18 bmp Vt Set:  [620 mL] 620 mL PEEP:  [5 cmH20] 5 cmH20 Plateau Pressure:  [14 cmH20] 14 cmH20   Intake/Output Summary (Last 24 hours) at Oct 04, 2021 1200 Last data filed at 10/04/21 1139 Gross per 24 hour  Intake 2558.25 ml  Output --  Net 2558.25 ml   Filed Weights   10/04/21 0536  Weight: 95 kg    Examination: General: Thin tall male who is sedated on full mechanical ventilatory support HENT: edentulous, no JVD or lymphadenopathy is appreciated Lungs: Coarse rhonchi bilaterally Cardiovascular: Heart sounds are distant Abdomen: Soft nontender positive bowel sounds Extremities: Skin turgor is dry to edema is appreciated Neuro: Currently sedated with fentanyl does not follow commands coughs positive gag reflex GU: Amber urine  Resolved Hospital Problem list     Assessment & Plan:  Ventilator dependent respiratory failure in the setting of recent pontine stroke, hypercarbia, suspected sepsis with elevated WBC and procalcitonin pending. Currently on full mechanical ventilatory support Adjustments per ABG Mixed acidosis with pH improving with increased respiratory rate  Sepsis in a nursing home patient with recent discharge from [facility] with pontine stroke along with failure to thrive complicated by multiple organ dysfunction. Empirical antimicrobial therapy Follow lactic acid Volume resuscitation Pressor support peripherally as needed May need central line and central pressors. 1 L of normal saline ordered at the time  of this note  Hyperglycemia CBG (last 3)  Recent Labs    08-Oct-2021 0537 08-Oct-2021 1131  GLUCAP 343* 404*   Sliding-scale insulin coverage May need insulin drip Note no ketones in urine  Pontine stroke recent CT head Questionable  neuro consult in future  Acute renal insufficiency  Lab Results  Component Value Date   CREATININE 2.30 (H) 10/08/2021   CREATININE 2.54 (H) 10/08/2021   CREATININE 0.79 09/17/2021  Continue hydration Avoid nephrotoxic Bladder scan May need ultrasound of kidneys and bladder   He will be admitted to the intensive care unit for further evaluation and treatment currently full code. Best Practice (right click and "Reselect all SmartList Selections" daily)   Diet/type: NPO DVT prophylaxis: not indicated GI prophylaxis: PPI Lines: N/A Foley:  N/A Code Status:  full code Last date of multidisciplinary goals of care discussion [TBD]  Labs   CBC: Recent Labs  Lab Oct 08, 2021 0538 10/08/21 0618 10/08/21 0817 2021-10-08 1013  WBC 25.6*  --   --   --   NEUTROABS 24.8*  --   --   --   HGB 15.3 16.0 12.9* 12.9*  HCT 50.1 47.0 38.0* 38.0*  MCV 98.6  --   --   --   PLT 443*  --   --   --     Basic Metabolic Panel: Recent Labs  Lab 08-Oct-2021 0538 2021-10-08 0618 10/08/21 0817 10/08/2021 1013  NA 152* 155* 157* 155*  K 3.7 4.3 3.4* 3.7  CL 115* 120*  --   --   CO2 19*  --   --   --   GLUCOSE 417* 413*  --   --   BUN 85* 112*  --   --   CREATININE 2.54* 2.30*  --   --   CALCIUM 9.2  --   --   --    GFR: Estimated Creatinine Clearance: 28.3 mL/min (A) (by C-G formula based on SCr of 2.3 mg/dL (H)). Recent Labs  Lab 10-08-2021 0538 10/08/2021 0920  WBC 25.6*  --   LATICACIDVEN 5.3* 3.3*    Liver Function Tests: Recent Labs  Lab 2021-10-08 0538  AST 33  ALT 38  ALKPHOS 61  BILITOT 1.5*  PROT 6.9  ALBUMIN 3.4*   No results for input(s): LIPASE, AMYLASE in the last 168 hours. No results for input(s): AMMONIA in the last 168 hours.  ABG    Component Value Date/Time   PHART 7.307 (L) 2021/10/08 1013   PCO2ART 41.2 2021-10-08 1013   PO2ART 164 (H) 10/08/2021 1013   HCO3 20.5 10/08/2021 1013   TCO2 22 10-08-2021 1013   ACIDBASEDEF 5.0 (H) Oct 08, 2021 1013   O2SAT 99.0  08-Oct-2021 1013     Coagulation Profile: Recent Labs  Lab 10-08-2021 0538  INR 1.4*    Cardiac Enzymes: No results for input(s): CKTOTAL, CKMB, CKMBINDEX, TROPONINI in the last 168 hours.  HbA1C: HbA1c, POC (prediabetic range)  Date/Time Value Ref Range Status  09/04/2021 10:12 AM 6.8 (A) 5.7 - 6.4 % Final   Hgb A1c MFr Bld  Date/Time Value Ref Range Status  09/15/2021 08:10 AM 6.9 (H) 4.8 - 5.6 % Final    Comment:    (NOTE) Pre diabetes:          5.7%-6.4%  Diabetes:              >6.4%  Glycemic control for   <7.0% adults with diabetes   07/02/2021 12:00 PM 7.4 (H) 4.8 - 5.6 %  Final    Comment:    (NOTE) Pre diabetes:          5.7%-6.4%  Diabetes:              >6.4%  Glycemic control for   <7.0% adults with diabetes     CBG: Recent Labs  Lab 15-Oct-2021 0537 10/15/2021 1131  GLUCAP 343* 404*    Review of Systems:   na  Past Medical History:  He,  has a past medical history of Arthritis, Blood transfusion without reported diagnosis, Cancer ([place]), Colon polyps (05/19/2012), Congenital anomaly of diaphragm (09/27/2009), DIABETES MELLITUS, TYPE II (96/22/2979), Diastolic dysfunction, Diverticulosis, ERECTILE DYSFUNCTION (08/25/2007), GOUTY ARTHROPATHY UNSPECIFIED (11/27/2008), Heart murmur, Hepatitis, HEPATITIS C (08/25/2007), HYPERTENSION (08/25/2007), HYPERTRIGLYCERIDEMIA (08/25/2007), and HYPOGONADISM (09/27/2009).   Surgical History:   Past Surgical History:  Procedure Laterality Date   ABDOMINAL HERNIA REPAIR  2000   ANTERIOR CERVICAL DECOMP/DISCECTOMY FUSION Right 03/04/2018   Procedure: Cervical Three-Four, Cervical Six-Seven Right Side approach, Anterior cervical decompression/discectomy/fusion;  Surgeon: Erline Levine, MD;  Location: [facility];  Service: Neurosurgery;  Laterality: Right;  right side anterior approach   CHOLECYSTECTOMY  1999   COLONOSCOPY     LUMBAR FUSION  2005   LUMBAR LAMINECTOMY/DECOMPRESSION MICRODISCECTOMY Left 07/05/2021   Procedure:  Left Thoracic 12 to Lumbar 1 Transpedicular microdiscectomy;  Surgeon: Erline Levine, MD;  Location: [place];  Service: Neurosurgery;  Laterality: Left;  3C/RM 21   TONSILLECTOMY AND ADENOIDECTOMY     UPPER GASTROINTESTINAL ENDOSCOPY       Social History:   reports that he has never smoked. He has never used smokeless tobacco. He reports that he does not currently use alcohol. He reports that he does not use drugs.   Family History:  His family history includes Diabetes in his maternal grandmother; Stroke in his mother. There is no history of Cancer, Colon cancer, Esophageal cancer, Rectal cancer, or Stomach cancer.   Allergies Allergies  Allergen Reactions   Lyrica [Pregabalin] Other (See Comments)    Blurred vision   Gabapentin Rash and Other (See Comments)    Diplopia (double vision)     Home Medications  Prior to Admission medications   Medication Sig Start Date End Date Taking? Authorizing Provider  ACCU-CHEK AVIVA PLUS test strip USE AS INSTRUCTED ONCE DAILY 09/15/20   Renato Shin, MD  allopurinol (ZYLOPRIM) 100 MG tablet TAKE 1 TABLET BY MOUTH EVERY DAY 09/17/21   August Albino, NP  amLODipine-olmesartan (AZOR) 10-40 MG tablet Take 1 tablet by mouth daily. 09/17/21   August Albino, NP  aspirin EC 81 MG EC tablet Take 1 tablet (81 mg total) by mouth daily. Swallow whole. 09/17/21   August Albino, NP  atorvastatin (LIPITOR) 40 MG tablet Take 1 tablet (40 mg total) by mouth daily. 09/17/21   August Albino, NP  b complex vitamins capsule Take 1 capsule by mouth daily. 09/17/21   August Albino, NP  clopidogrel (PLAVIX) 75 MG tablet Take 1 tablet (75 mg total) by mouth daily. 09/17/21   August Albino, NP  colchicine 0.6 MG tablet 1 tablet every hour as needed for gout, not to exceed 6/day Patient taking differently: Take 0.6 mg by mouth as needed (gout - no more than 3.6mg  per day). 09/01/18   Renato Shin, MD  irbesartan (AVAPRO) 300 MG tablet Take 1 tablet (300 mg  total) by mouth daily. 09/17/21   August Albino, NP  JANUVIA 100 MG tablet TAKE 1 TABLET BY MOUTH  EVERY DAY Patient taking differently: Take 100 mg by mouth daily. 07/11/21   Renato Shin, MD  metFORMIN (GLUCOPHAGE) 1000 MG tablet TAKE 1 TABLET BY MOUTH 2 TIMES DAILY WITH A MEAL. Patient taking differently: Take 1,000 mg by mouth 2 (two) times daily with a meal. 08/12/21   Renato Shin, MD  Multiple Vitamins-Minerals (MULTIVITAMIN WITH MINERALS) tablet Take 1 tablet by mouth daily. 09/17/21   August Albino, NP  Omega-3 Fatty Acids (FISH OIL) 1000 MG CAPS Take 2 capsules (2,000 mg total) by mouth daily. 09/17/21   August Albino, NP  repaglinide (PRANDIN) 2 MG tablet Take 1 tablet (2 mg total) by mouth 2 (two) times daily before a meal. 09/17/21   August Albino, NP     Critical care time: 49 min    Richardson Landry Verlin Duke ACNP Acute Care Nurse Practitioner [place] Please consult Amion 10/14/2021, 12:01 PM

## 2021-10-01 NOTE — Progress Notes (Signed)
Nutrition Brief Note  RD drawn to chart due to new vent and active Cortrak tube order. Chart reviewed. Pt transitioning to comfort care per CCM resident MD note. No further nutrition interventions planned at this time.  Please re-consult as needed.   Gustavus Bryant, MS, RD, LDN Inpatient Clinical Dietitian Please see AMiON for contact information.

## 2021-10-01 NOTE — Progress Notes (Signed)
Patient transported to 3M11 from ED without complications. RN at bedside.

## 2021-10-01 NOTE — Death Summary Note (Signed)
    Physician Death Summary  Name: Larry Robertson MRN: [id number] DOB: July 17, 1941 80 y.o.  Date of Admission: 23-Oct-2021  5:26 AM Date of Discharge: 09/26/2021 Attending Physician: Dr. [name]  Discharge Diagnosis: Active Problems:   Sepsis ([place]) DM HTN HLD Pont[place] CVA Cirrhosis  Hx of HVC CKD   Cause of death: Sepsis with acute hypoxic respiratory failure  Time of death: 24-Oct-2023 at [place]  Disposition and follow-up:   Larry Robertson was discharged from [facility] in expired condition.    Hospital Course: Patient presented from facility after a recent pontine CVA with signs and symptoms concerning for sepsis with acute hypoxic respiratory failure intubation.  On admission to the ICU, family meeting was performed.  He stated that resuscitation of any time was against the patient's goals of care.  As result, patient was compassionately extubated and expired at [place].  [name], D.O.  Internal Medicine Resident, PGY-3 [facility]  Pager: [phone] 8:46 AM, 09/26/2021

## 2021-10-01 NOTE — ED Triage Notes (Signed)
Pt came from SNF per EMS, Pt has been short of breath and clammy since 2300 last night, CBG or 440 with EMS, EMS started IV and bolused 500 cc ns

## 2021-10-01 NOTE — Progress Notes (Signed)
Patient transported to CT and back to ED 35 without complications. RN at bedside.

## 2021-10-01 NOTE — Procedures (Signed)
Central Venous Catheter Insertion Procedure Note  Larry Robertson  [id number]  [birth year]  Date:10-25-2021  Time:2:08 PM   Provider Performing:Deara Bober   Procedure: Insertion of Non-tunneled Central Venous Catheter(36556) with US guidance (83382)   Indication(s) Medication administration  Consent Unable to obtain consent due to emergent nature of procedure.  Anesthesia Topical only with 1% lidocaine   Timeout Verified patient identification, verified procedure, site/side was marked, verified correct patient position, special equipment/implants available, medications/allergies/relevant history reviewed, required imaging and test results available.  Sterile Technique Maximal sterile technique including full sterile barrier drape, hand hygiene, sterile gown, sterile gloves, mask, hair covering, sterile ultrasound probe cover (if used).  Procedure Description Area of catheter insertion was cleaned with chlorhexidine and draped in sterile fashion.  With real-time ultrasound guidance a 91F 20cm central venous catheter was placed into the left subclavian vein. Nonpulsatile blood flow and easy flushing noted in all ports.  The catheter was sutured in place and sterile dressing applied.  Complications/Tolerance None; patient tolerated the procedure well. Chest X-ray is ordered to verify placement for internal jugular or subclavian cannulation.   Chest x-ray is not ordered for femoral cannulation.  EBL Minimal  Specimen(s) None  [name], MD [facility] ICU Physician [place]  Pager: [phone] Or Epic Secure Chat After hours: [phone].  2021-10-25, 2:09 PM

## 2021-10-01 NOTE — Progress Notes (Signed)
Paged to patient's room for family questions. I spoke with the patient's brother regarding the patient's clinical course so far. I informed him of the presenting events and the required measured needed to resuscitate him. He states that it was against Larry Robertson's GOC of care to be resuscitated with a ET. Subsequently, we held a family meeting with his Ex-wife, daughters, cousins, and close family members. I discussed in depth the nature of current illness and possible modalities for treatment moving forward. I counseled them, regarding the difference between immediate resuscitation versus the need for long term life support and how that relates to his current clinical situation. Ultimately, family states that it is against his GOC to be resuscitated regardless of the possibility for extubation in the future. I counseled them regarding comfort care and they agree to transition to CC with compassionate extubation. I have started morphine and allowed the family to visit with the patient. Additionally, I have informed RT to coordinate the extubation.  Larry Robertson, D.O.  Internal Medicine Resident, PGY-3 [facility]  Pager: [phone] 3:42 PM, 09-29-2021

## 2021-10-01 NOTE — Progress Notes (Signed)
This RN wasted 54mL Morphine and 38mL versed with Sandford Craze, RN in stericycle.

## 2021-10-01 NOTE — ED Provider Notes (Signed)
[facility] EMERGENCY DEPARTMENT Provider Note   CSN: [id number] Arrival date & time: 10/10/21  1740     History Chief Complaint  Patient presents with   Shortness of Breath    Larry Robertson is a 80 y.o. male.  Patient presents to ER chief complaint of worsening breathing.  He presents from a nursing home he has been there for the past week.  He was recently discharged October 18 after a stroke that required tPA.  Typically he is on 2 to 3 L nasal cannula support but now requiring 5 L at his nursing home.  Family later states that he fell 3 days ago from his nursing home as well.  Unable to gain additional history or review of systems with patient himself as he appears very ill.      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   Heart murmur    but was not stated by DR Hilty   Hepatitis    took treatment   HEPATITIS C 08/25/2007   HYPERTENSION 08/25/2007   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   no per pt   HYPOGONADISM 09/27/2009    Patient Active Problem List   Diagnosis Date Noted   Stroke ([place]) 09/14/2021   Herniated thoracic disc without myelopathy 07/05/2021   Elevated triglycerides with high cholesterol 04/18/2021   Other chest pain 04/11/2021   CKD (chronic kidney disease) stage 2, GFR 60-89 ml/min 12/16/2019   B12 deficiency 12/16/2019   Iron overload 12/16/2019   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   Macrocytosis 10/18/2019   Anemia 09/19/2019   Need for influenza vaccination 09/19/2019   Breast mass 09/15/2019   Breast tenderness in male 09/15/2019   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/12/2019   OA (osteoarthritis) of knee 06/17/2019   History of hepatitis C 06/14/2019   Elevated cholesterol 06/14/2019   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   Labral tear of shoulder, degenerative, left 08/23/2018   Contracture of joint of left shoulder region 08/23/2018   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/23/2018   Chronic pain syndrome 08/23/2018   Herniated cervical intervertebral disc 03/04/2018   Narcotic drug use 11/23/2017   Dyslipidemia 10/01/2017   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/01/2017   Atrial fibrillation ([place]) 06/08/2017   Thrombocytopenia ([place]) 10/15/2016   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   Nocturia 08/09/2014   Hematuria 08/09/2014   Other dysphagia 05/18/2014   Personal history of colonic polyps 07/13/2013   Nonspecific abnormal electrocardiogram (ECG) (EKG) 04/20/2013   Encounter for long-term (current) use of other medications 04/15/2012   Healthcare maintenance 04/15/2012   Radiculopathy of leg 10/08/2011   Blurry vision 10/08/2011   Special screening, prostate cancer 04/09/2011   Lumbago 04/09/2011   OTHER SPEC HYPERTROPHIC&ATROPHIC CONDITION SKIN 02/06/2010   FOOT PAIN, LEFT 02/06/2010   HYPOGONADISM 09/27/2009   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   Left sided abdominal pain of unknown cause 07/17/2009   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   Acute hepatitis C virus infection 08/25/2007   Diabetes ([place]) 08/25/2007   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   ERECTILE DYSFUNCTION 08/25/2007   Essential hypertension  08/25/2007    Past Surgical History:  Procedure Laterality Date   ABDOMINAL HERNIA REPAIR  2000   ANTERIOR CERVICAL DECOMP/DISCECTOMY FUSION Right 03/04/2018   Procedure: Cervical Three-Four, Cervical Six-Seven Right Side approach, Anterior cervical decompression/discectomy/fusion;  Surgeon: Erline Levine, MD;  Location: [place];  Service: Neurosurgery;  Laterality: Right;  right side anterior approach   CHOLECYSTECTOMY  1999    COLONOSCOPY     LUMBAR FUSION  2005   LUMBAR LAMINECTOMY/DECOMPRESSION MICRODISCECTOMY Left 07/05/2021   Procedure: Left Thoracic 12 to Lumbar 1 Transpedicular microdiscectomy;  Surgeon: Erline Levine, MD;  Location: [place];  Service: Neurosurgery;  Laterality: Left;  3C/RM 21   TONSILLECTOMY AND ADENOIDECTOMY     UPPER GASTROINTESTINAL ENDOSCOPY         Family History  Problem Relation Age of Onset   Stroke Mother    Diabetes Maternal Grandmother    Cancer Neg Hx    Colon cancer Neg Hx    Esophageal cancer Neg Hx    Rectal cancer Neg Hx    Stomach cancer Neg Hx     Social History   Tobacco Use   Smoking status: Never   Smokeless tobacco: Never  Vaping Use   Vaping Use: Never used  Substance Use Topics   Alcohol use: Not Currently    Comment: Quit 3 months ago in July 2020.   Drug use: No    Home Medications Prior to Admission medications   Medication Sig Start Date End Date Taking? Authorizing Provider  ACCU-CHEK AVIVA PLUS test strip USE AS INSTRUCTED ONCE DAILY 09/15/20   Renato Shin, MD  allopurinol (ZYLOPRIM) 100 MG tablet TAKE 1 TABLET BY MOUTH EVERY DAY 09/17/21   August Albino, NP  amLODipine-olmesartan (AZOR) 10-40 MG tablet Take 1 tablet by mouth daily. 09/17/21   August Albino, NP  aspirin EC 81 MG EC tablet Take 1 tablet (81 mg total) by mouth daily. Swallow whole. 09/17/21   August Albino, NP  atorvastatin (LIPITOR) 40 MG tablet Take 1 tablet (40 mg total) by mouth daily. 09/17/21   August Albino, NP  b complex vitamins capsule Take 1 capsule by mouth daily. 09/17/21   August Albino, NP  clopidogrel (PLAVIX) 75 MG tablet Take 1 tablet (75 mg total) by mouth daily. 09/17/21   August Albino, NP  colchicine 0.6 MG tablet 1 tablet every hour as needed for gout, not to exceed 6/day Patient taking differently: Take 0.6 mg by mouth as needed (gout - no more than 3.6mg  per day). 09/01/18   Renato Shin, MD  irbesartan (AVAPRO) 300 MG tablet Take 1  tablet (300 mg total) by mouth daily. 09/17/21   August Albino, NP  JANUVIA 100 MG tablet TAKE 1 TABLET BY MOUTH EVERY DAY Patient taking differently: Take 100 mg by mouth daily. 07/11/21   Renato Shin, MD  metFORMIN (GLUCOPHAGE) 1000 MG tablet TAKE 1 TABLET BY MOUTH 2 TIMES DAILY WITH A MEAL. Patient taking differently: Take 1,000 mg by mouth 2 (two) times daily with a meal. 08/12/21   Renato Shin, MD  Multiple Vitamins-Minerals (MULTIVITAMIN WITH MINERALS) tablet Take 1 tablet by mouth daily. 09/17/21   August Albino, NP  Omega-3 Fatty Acids (FISH OIL) 1000 MG CAPS Take 2 capsules (2,000 mg total) by mouth daily. 09/17/21   August Albino, NP  repaglinide (PRANDIN) 2 MG tablet Take 1 tablet (2 mg total) by mouth 2 (two) times daily before a meal. 09/17/21  August Albino, NP    Allergies    Lyrica [pregabalin] and Gabapentin  Review of Systems   Review of Systems  Unable to perform ROS: Acuity of condition   Physical Exam Updated Vital Signs BP 115/74   Pulse 98   Temp 99.7 F (37.6 C) (Oral)   Resp 16   Ht 6' 0.84" (1.85 m)   Wt 95 kg   SpO2 100%   BMI 27.76 kg/m   Physical Exam Constitutional:      General: He is in acute distress.     Appearance: He is well-developed. He is ill-appearing.     Comments: Patient is awake, appears moderately obtunded.  HENT:     Head: Normocephalic.     Nose: Nose normal.  Eyes:     Extraocular Movements: Extraocular movements intact.  Cardiovascular:     Rate and Rhythm: Tachycardia present.  Pulmonary:     Effort: Respiratory distress present.     Breath sounds: Rhonchi present.  Skin:    Coloration: Skin is not jaundiced.  Neurological:     Comments: Patient moderately obtunded.  Does move his left upper extremity spontaneously but does not follow commands, does not answer questions.    ED Results / Procedures / Treatments   Labs (all labs ordered are listed, but only abnormal results are displayed) Labs Reviewed   LACTIC ACID, PLASMA - Abnormal; Notable for the following components:      Result Value   Lactic Acid, Venous 5.3 (*)    All other components within normal limits  LACTIC ACID, PLASMA - Abnormal; Notable for the following components:   Lactic Acid, Venous 3.3 (*)    All other components within normal limits  COMPREHENSIVE METABOLIC PANEL - Abnormal; Notable for the following components:   Sodium 152 (*)    Chloride 115 (*)    CO2 19 (*)    Glucose, Bld 417 (*)    BUN 85 (*)    Creatinine, Ser 2.54 (*)    Albumin 3.4 (*)    Total Bilirubin 1.5 (*)    GFR, Estimated 25 (*)    Anion gap 18 (*)    All other components within normal limits  CBC WITH DIFFERENTIAL/PLATELET - Abnormal; Notable for the following components:   WBC 25.6 (*)    Platelets 443 (*)    Neutro Abs 24.8 (*)    Monocytes Absolute 0.0 (*)    All other components within normal limits  PROTIME-INR - Abnormal; Notable for the following components:   Prothrombin Time 17.0 (*)    INR 1.4 (*)    All other components within normal limits  URINALYSIS, ROUTINE W REFLEX MICROSCOPIC - Abnormal; Notable for the following components:   Color, Urine AMBER (*)    APPearance HAZY (*)    Glucose, UA 50 (*)    Protein, ur 30 (*)    Bacteria, UA RARE (*)    All other components within normal limits  CBG MONITORING, ED - Abnormal; Notable for the following components:   Glucose-Capillary 343 (*)    All other components within normal limits  I-STAT CHEM 8, ED - Abnormal; Notable for the following components:   Sodium 155 (*)    Chloride 120 (*)    BUN 112 (*)    Creatinine, Ser 2.30 (*)    Glucose, Bld 413 (*)    All other components within normal limits  I-STAT ARTERIAL BLOOD GAS, ED - Abnormal; Notable for the following components:  pH, Arterial 7.178 (*)    pCO2 arterial 58.6 (*)    Acid-base deficit 7.0 (*)    Sodium 157 (*)    Potassium 3.4 (*)    HCT 38.0 (*)    Hemoglobin 12.9 (*)    All other components  within normal limits  I-STAT ARTERIAL BLOOD GAS, ED - Abnormal; Notable for the following components:   pH, Arterial 7.307 (*)    pO2, Arterial 164 (*)    Acid-base deficit 5.0 (*)    Sodium 155 (*)    HCT 38.0 (*)    Hemoglobin 12.9 (*)    All other components within normal limits  TROPONIN I (HIGH SENSITIVITY) - Abnormal; Notable for the following components:   Troponin I (High Sensitivity) 39 (*)    All other components within normal limits  TROPONIN I (HIGH SENSITIVITY) - Abnormal; Notable for the following components:   Troponin I (High Sensitivity) 53 (*)    All other components within normal limits  CULTURE, BLOOD (ROUTINE X 2)  CULTURE, BLOOD (ROUTINE X 2)  RESP PANEL BY RT-PCR (FLU A&B, COVID) ARPGX2  URINE CULTURE  APTT  BLOOD GAS, ARTERIAL  MISCELLANEOUS GENETIC TEST    EKG EKG Interpretation  Date/Time:  06-Oct-2021 05:38:43 EDT Ventricular Rate:  143 PR Interval:    QRS Duration: 96 QT Interval:  384 QTC Calculation: 593 R Axis:   -48 Text Interpretation: Supraventricular tachycardia Left anterior fascicular block Abnormal R-wave progression, early transition When compared with ECG of 09/14/2021, Supraventricular tachycardia has replaced Sinus rhythm Confirmed by Delora Fuel ([id number]) on 2021/10/06 5:47:46 AM  Radiology DG Abdomen 1 View  Result Date: 10-06-2021 CLINICAL DATA:  NG placement. EXAM: ABDOMEN - 1 VIEW COMPARISON:  Same day chest radiograph at 8:53 a.m. FINDINGS: Partially visualized enteric tube tip projects over the distal esophagus with the side hole at the level of the middle third of the esophagus common unchanged position compared to earlier same day chest radiograph. Recommend advancement and re-imaging prior to use. Scattered air is seen in the stomach, small bowel and colon with a nonobstructive bowel gas pattern. Surgical clips overlie the right upper abdomen. Multilevel degenerative disc disease throughout the thoracic and  lumbar spine. Small left pleural effusion as noted on same day chest radiograph. IMPRESSION: Enteric tube tip projects over the distal esophagus, in unchanged position compared to earlier same day chest radiograph. Recommend advancement and re-imaging prior to use. Electronically Signed   By: Ileana Roup M.D.   On: 10-06-21 10:23   DG Chest Portable 1 View  Result Date: October 06, 2021 CLINICAL DATA:  Intubation EXAM: PORTABLE CHEST 1 VIEW COMPARISON:  2021-10-06 at 0553 hours FINDINGS: Interval placement of endotracheal tube with distal tip terminating 3.5 cm above the carina. Interval placement of enteric tube with distal tip terminating in the mid to distal esophagus. Stable heart size. Atherosclerotic calcification of the aortic knob. Persistent bibasilar opacities with probable left-sided pleural effusion. No pneumothorax. IMPRESSION: 1. Interval placement of enteric tube with distal tip terminating in the mid to distal esophagus. Recommend advancement at least 15 cm. 2. Satisfactory positioning of ET tube. 3. Otherwise stable radiographic appearance of the chest with bibasilar opacities and a probable small left pleural effusion. Electronically Signed   By: Davina Poke D.O.   On: 2021-10-06 09:26   DG Chest Port 1 View  Result Date: 2021/10/06 CLINICAL DATA:  80 year old male with history of possible sepsis. EXAM: PORTABLE CHEST 1 VIEW COMPARISON:  Chest x-ray  09/16/2021. FINDINGS: Severe chronic elevation of the left hemidiaphragm again noted. Bibasilar opacities have increased compared to the prior study, concerning for developing areas of atelectasis and/or consolidation. No definite pleural effusions. No pneumothorax. No evidence of pulmonary edema. Cardiac silhouette is obscured. Upper mediastinal contours are within normal limits. Atherosclerotic calcifications are noted in the thoracic aorta. IMPRESSION: 1. Worsening bibasilar areas of atelectasis and/or airspace consolidation. 2. Severe  chronic elevation of the left hemidiaphragm. 3. Aortic atherosclerosis. Electronically Signed   By: Vinnie Langton M.D.   On: 10/17/2021 06:11    Procedures Date/Time: 17-Oct-2021 11:18 AM Performed by: Luna Fuse, MD Comments: RSI medication 20 mg of etomidate and 100 mg of rocuronium provided.  8.0 ET tube advanced to 24 at the teeth and secured.  Patient desatted into low 80%, and then rebounded back up to 95% via ET tube.  Breath sounds are equal bilaterally.        .Critical Care Performed by: Luna Fuse, MD Authorized by: Luna Fuse, MD   Critical care provider statement:    Critical care time (minutes):  40   Critical care time was exclusive of:  Separately billable procedures and treating other patients and teaching time   Critical care was necessary to treat or prevent imminent or life-threatening deterioration of the following conditions:  Respiratory failure and sepsis   Medications Ordered in ED Medications  norepinephrine (LEVOPHED) 4mg  in 220mL premix infusion (18 mcg/min Intravenous Infusion Verify 2021-10-17 0930)  insulin aspart (novoLOG) injection 7 Units (has no administration in time range)  vancomycin (VANCOREADY) IVPB 1750 mg/350 mL (1,750 mg Intravenous New Bag/Given 10-17-21 1021)  etomidate (AMIDATE) injection 20 mg (20 mg Intravenous Not Given Oct 17, 2021 0927)  rocuronium (ZEMURON) injection 100 mg (100 mg Intravenous Not Given 10-17-2021 0928)  fentaNYL 2526mcg in NS 270mL (61mcg/ml) infusion-PREMIX (50 mcg/hr Intravenous Infusion Verify 2021/10/17 0930)  sodium chloride 0.9 % bolus 500 mL (0 mLs Intravenous Stopped 10/17/21 0700)  acetaminophen (TYLENOL) suppository 650 mg (650 mg Rectal Given Oct 17, 2021 0544)  piperacillin-tazobactam (ZOSYN) IVPB 3.375 g (0 g Intravenous Stopped October 17, 2021 0651)  sodium chloride 0.9 % bolus 1,000 mL ( Intravenous Stopped 2021-10-17 0849)  sodium chloride 0.9 % bolus 1,000 mL ( Intravenous Stopped 10/17/2021 0724)  sodium  chloride 0.9 % bolus 500 mL (500 mLs Intravenous New Bag/Given October 17, 2021 1034)  etomidate (AMIDATE) injection ( Intravenous Canceled Entry 10/17/21 0830)  rocuronium (ZEMURON) injection (100 mg Intravenous Given 10/17/21 0830)    ED Course  I have reviewed the triage vital signs and the nursing notes.  Pertinent labs & imaging results that were available during my care of the patient were reviewed by me and considered in my medical decision making (see chart for details).    MDM Rules/Calculators/A&P                           Patient appears in acute respite distress.  Placed on facemask but still working very hard to breathe.  Insufficient improvement noted.  Decision made to intubate the patient.  Labs returned concerning for sepsis elevated white count elevated lactic acid.  Also has acute kidney injury blood glucose greater than 400, anion gap appears elevated bicarb 19.  Initial pH 7.10.  Given 7 units of IV insulin as well as 3 L bolus of IV fluid resuscitation and broad-spectrum antibiotics.  CT imaging of the head pursued.  Consultation with ICU for admission.  Final Clinical Impression(s) /  ED Diagnoses Final diagnoses:  Acute respiratory failure with hypoxia (HCC)  Sepsis, due to unspecified organism, unspecified whether acute organ dysfunction present ([place])  HCAP (healthcare-associated pneumonia)  AKI (acute kidney injury) [facility])    Rx / DC Orders ED Discharge Orders     None        Luna Fuse, MD October 24, 2021 1120

## 2021-10-01 NOTE — ED Provider Notes (Signed)
[facility] EMERGENCY DEPARTMENT Provider Note   CSN: [id number] Arrival date & time: Oct 13, 2021  5427     History Chief Complaint  Patient presents with   Shortness of Breath    Larry Robertson is a 80 y.o. male.  The history is provided by the EMS personnel and medical records.  Shortness of Breath Larry Robertson is a 80 y.o. male who presents to the Emergency Department complaining of difficulty breathing. He presents the emergency department by EMS from [facility] rehab facility for evaluation of abrupt onset shortness of breath that started around 11 PM. He has associated shortness of breath. Patient complains of chest pain as well. Level V caveat due to acute distress and difficulty speaking. 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 He did have some slurred speech and dysphasia following recent stroke. He was started on aspirin and Plavix. EMS provided 500 mL normal saline prior to ED arrival.    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   Heart murmur    but was not stated by DR Hilty   Hepatitis    took treatment   HEPATITIS C 08/25/2007   HYPERTENSION 08/25/2007   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   no per pt   HYPOGONADISM 09/27/2009    Patient Active Problem List   Diagnosis Date Noted   Stroke ([place]) 09/14/2021   Herniated thoracic disc without myelopathy 07/05/2021   Elevated triglycerides with high cholesterol 04/18/2021   Other chest pain 04/11/2021   CKD (chronic kidney disease) stage 2, GFR 60-89 ml/min 12/16/2019   B12 deficiency  12/16/2019   Iron overload 12/16/2019   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   Macrocytosis 10/18/2019   Anemia 09/19/2019   Need for influenza vaccination 09/19/2019   Breast mass 09/15/2019   Breast tenderness in male 09/15/2019   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   OA (osteoarthritis) of knee 06/17/2019   History of hepatitis C 06/14/2019   Elevated cholesterol 06/14/2019   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   Labral tear of shoulder, degenerative, left 08/23/2018   Contracture of joint of left shoulder region 08/23/2018   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/23/2018   Chronic pain syndrome 08/23/2018   Herniated cervical intervertebral disc 03/04/2018   Narcotic drug use 11/23/2017   Dyslipidemia 10/01/2017   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/01/2017   Atrial fibrillation ([place]) 06/08/2017   Thrombocytopenia ([place]) 10/15/2016   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   Nocturia 08/09/2014   Hematuria 08/09/2014   Other dysphagia 05/18/2014   Personal history of colonic polyps 07/13/2013   Nonspecific abnormal electrocardiogram (ECG) (EKG) 04/20/2013   Encounter for long-term (current) use of other medications 04/15/2012   Healthcare maintenance 04/15/2012   Radiculopathy of leg 10/08/2011   Blurry vision 10/08/2011   Special screening, prostate cancer 04/09/2011   Lumbago 04/09/2011   OTHER SPEC HYPERTROPHIC&ATROPHIC CONDITION SKIN 02/06/2010   FOOT PAIN, LEFT 02/06/2010   HYPOGONADISM 09/27/2009   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   Left sided abdominal pain of unknown  cause 07/17/2009   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   Acute hepatitis C virus infection 08/25/2007   Diabetes ([place]) 08/25/2007   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   ERECTILE DYSFUNCTION 08/25/2007   Essential hypertension 08/25/2007    Past Surgical History:  Procedure Laterality Date   ABDOMINAL HERNIA REPAIR  2000   ANTERIOR CERVICAL DECOMP/DISCECTOMY FUSION Right  03/04/2018   Procedure: Cervical Three-Four, Cervical Six-Seven Right Side approach, Anterior cervical decompression/discectomy/fusion;  Surgeon: Erline Levine, MD;  Location: [facility];  Service: Neurosurgery;  Laterality: Right;  right side anterior approach   CHOLECYSTECTOMY  1999   COLONOSCOPY     LUMBAR FUSION  2005   LUMBAR LAMINECTOMY/DECOMPRESSION MICRODISCECTOMY Left 07/05/2021   Procedure: Left Thoracic 12 to Lumbar 1 Transpedicular microdiscectomy;  Surgeon: Erline Levine, MD;  Location: [facility];  Service: Neurosurgery;  Laterality: Left;  3C/RM 21   TONSILLECTOMY AND ADENOIDECTOMY     UPPER GASTROINTESTINAL ENDOSCOPY         Family History  Problem Relation Age of Onset   Stroke Mother    Diabetes Maternal Grandmother    Cancer Neg Hx    Colon cancer Neg Hx    Esophageal cancer Neg Hx    Rectal cancer Neg Hx    Stomach cancer Neg Hx     Social History   Tobacco Use   Smoking status: Never   Smokeless tobacco: Never  Vaping Use   Vaping Use: Never used  Substance Use Topics   Alcohol use: Not Currently    Comment: Quit 3 months ago in July 2020.   Drug use: No    Home Medications Prior to Admission medications   Medication Sig Start Date End Date Taking? Authorizing Provider  ACCU-CHEK AVIVA PLUS test strip USE AS INSTRUCTED ONCE DAILY 09/15/20   Renato Shin, MD  allopurinol (ZYLOPRIM) 100 MG tablet TAKE 1 TABLET BY MOUTH EVERY DAY 09/17/21   August Albino, NP  amLODipine-olmesartan (AZOR) 10-40 MG tablet Take 1 tablet by mouth daily. 09/17/21   August Albino, NP  aspirin EC 81 MG EC tablet Take 1 tablet (81 mg total) by mouth daily. Swallow whole. 09/17/21   August Albino, NP  atorvastatin (LIPITOR) 40 MG tablet Take 1 tablet (40 mg total) by mouth daily. 09/17/21   August Albino, NP  b complex vitamins capsule Take 1 capsule by mouth daily. 09/17/21   August Albino, NP  clopidogrel (PLAVIX) 75 MG tablet Take 1 tablet (75 mg total) by mouth daily. 09/17/21    August Albino, NP  colchicine 0.6 MG tablet 1 tablet every hour as needed for gout, not to exceed 6/day Patient taking differently: Take 0.6 mg by mouth as needed (gout - no more than 3.6mg  per day). 09/01/18   Renato Shin, MD  irbesartan (AVAPRO) 300 MG tablet Take 1 tablet (300 mg total) by mouth daily. 09/17/21   August Albino, NP  JANUVIA 100 MG tablet TAKE 1 TABLET BY MOUTH EVERY DAY Patient taking differently: Take 100 mg by mouth daily. 07/11/21   Renato Shin, MD  metFORMIN (GLUCOPHAGE) 1000 MG tablet TAKE 1 TABLET BY MOUTH 2 TIMES DAILY WITH A MEAL. Patient taking differently: Take 1,000 mg by mouth 2 (two) times daily with a meal. 08/12/21   Renato Shin, MD  Multiple Vitamins-Minerals (MULTIVITAMIN WITH MINERALS) tablet Take 1 tablet by mouth daily. 09/17/21   August Albino, NP  Omega-3 Fatty Acids (FISH OIL) 1000 MG CAPS Take 2 capsules (2,000 mg total) by  mouth daily. 09/17/21   August Albino, NP  repaglinide (PRANDIN) 2 MG tablet Take 1 tablet (2 mg total) by mouth 2 (two) times daily before a meal. 09/17/21   August Albino, NP    Allergies    Lyrica [pregabalin] and Gabapentin  Review of Systems   Review of Systems  Respiratory:  Positive for shortness of breath.   All other systems reviewed and are negative.  Physical Exam Updated Vital Signs BP (!) 132/93 (BP Location: Left Arm)   Pulse (!) 140   Resp (!) 28   Ht 6' 0.84" (1.85 m)   Wt 95 kg   SpO2 95%   BMI 27.76 kg/m   Physical Exam Vitals and nursing note reviewed.  Constitutional:      General: He is in acute distress.     Appearance: He is well-developed. He is ill-appearing.  HENT:     Head: Normocephalic and atraumatic.  Cardiovascular:     Rate and Rhythm: Regular rhythm. Tachycardia present.     Heart sounds: No murmur heard. Pulmonary:     Comments: Tachypnea. Decreased air movement bilaterally. Occasional wheezes in the left lung base. Abdominal:     Palpations: Abdomen is soft.      Tenderness: There is no abdominal tenderness. There is no guarding or rebound.  Musculoskeletal:        General: No swelling or tenderness.  Skin:    General: Skin is warm and dry.  Neurological:     Mental Status: He is alert.     Comments: Nonverbal. Follows basic one-step commands. 3 to 4/5 strength in the left upper and left lower extremity. Flaccid in the right upper and right lower extremities  Psychiatric:     Comments: Unable to assess    ED Results / Procedures / Treatments   Labs (all labs ordered are listed, but only abnormal results are displayed) Labs Reviewed  CBG MONITORING, ED - Abnormal; Notable for the following components:      Result Value   Glucose-Capillary 343 (*)    All other components within normal limits  URINE CULTURE  CULTURE, BLOOD (ROUTINE X 2)  CULTURE, BLOOD (ROUTINE X 2)  RESP PANEL BY RT-PCR (FLU A&B, COVID) ARPGX2  LACTIC ACID, PLASMA  LACTIC ACID, PLASMA  COMPREHENSIVE METABOLIC PANEL  CBC WITH DIFFERENTIAL/PLATELET  PROTIME-INR  APTT  URINALYSIS, ROUTINE W REFLEX MICROSCOPIC  I-STAT CHEM 8, ED  TROPONIN I (HIGH SENSITIVITY)    EKG EKG Interpretation    Radiology No results found.  Procedures Procedures  CRITICAL CARE Performed by: Quintella Reichert   Total critical care time: 40 minutes  Critical care time was exclusive of separately billable procedures and treating other patients.  Critical care was necessary to treat or prevent imminent or life-threatening deterioration.  Critical care was time spent personally by me on the following activities: development of treatment plan with patient and/or surrogate as well as nursing, discussions with consultants, evaluation of patient's response to treatment, examination of patient, obtaining history from patient or surrogate, ordering and performing treatments and interventions, ordering and review of laboratory studies, ordering and review of radiographic studies, pulse oximetry and  re-evaluation of patient's condition.  Medications Ordered in ED Medications  sodium chloride 0.9 % bolus 500 mL (has no administration in time range)  acetaminophen (TYLENOL) suppository 650 mg (has no administration in time range)    ED Course  I have reviewed the triage vital signs and the nursing notes.  Pertinent labs &  imaging results that were available during my care of the patient were reviewed by me and considered in my medical decision making (see chart for details).    MDM Rules/Calculators/A&P                          Pt here from [facility] for sob.  Pt in respiratory distress, febrile on ED presentation.  He was treated with IVF, acetaminophen, abx for pna.  Labs significant for AKI and he was treated with aggressive fluid hydration.  Discussed pt with his daughter critical nature of illness - code status is unknown at this time.  Pt care transferred pending additional labs, reassessment.    Final Clinical Impression(s) / ED Diagnoses Final diagnoses:  None    Rx / DC Orders ED Discharge Orders     None        Quintella Reichert, MD 2021/10/24 [phone]

## 2021-10-01 NOTE — Progress Notes (Signed)
RT compassionately extubated patient per MD or and family wishes with RN at bedside.

## 2021-10-01 DEATH — deceased

## 2021-10-03 ENCOUNTER — Other Ambulatory Visit: Payer: Self-pay | Admitting: Endocrinology

## 2021-10-23 ENCOUNTER — Inpatient Hospital Stay: Payer: Medicare Other | Admitting: Adult Health

## 2022-02-10 ENCOUNTER — Ambulatory Visit: Payer: Medicare Other | Admitting: Family Medicine

## 2022-03-05 ENCOUNTER — Ambulatory Visit: Payer: Medicare Other | Admitting: Endocrinology
